# Patient Record
Sex: Female | Born: 1945 | Race: White | Hispanic: No | Marital: Married | Smoking: Former smoker
Health system: Southern US, Community
[De-identification: ages and names within clinical notes are randomized; demographics above are authoritative.]

## PROBLEM LIST (undated history)

## (undated) DIAGNOSIS — K7689 Other specified diseases of liver: Secondary | ICD-10-CM

## (undated) DIAGNOSIS — L0390 Cellulitis, unspecified: Secondary | ICD-10-CM

## (undated) DIAGNOSIS — G4730 Sleep apnea, unspecified: Secondary | ICD-10-CM

## (undated) DIAGNOSIS — H409 Unspecified glaucoma: Secondary | ICD-10-CM

## (undated) DIAGNOSIS — K644 Residual hemorrhoidal skin tags: Secondary | ICD-10-CM

## (undated) DIAGNOSIS — I10 Essential (primary) hypertension: Secondary | ICD-10-CM

## (undated) DIAGNOSIS — J45909 Unspecified asthma, uncomplicated: Secondary | ICD-10-CM

## (undated) DIAGNOSIS — H269 Unspecified cataract: Secondary | ICD-10-CM

## (undated) DIAGNOSIS — Z9989 Dependence on other enabling machines and devices: Secondary | ICD-10-CM

## (undated) DIAGNOSIS — R011 Cardiac murmur, unspecified: Secondary | ICD-10-CM

## (undated) DIAGNOSIS — K589 Irritable bowel syndrome without diarrhea: Secondary | ICD-10-CM

## (undated) DIAGNOSIS — L409 Psoriasis, unspecified: Secondary | ICD-10-CM

## (undated) DIAGNOSIS — G4733 Obstructive sleep apnea (adult) (pediatric): Secondary | ICD-10-CM

## (undated) DIAGNOSIS — R600 Localized edema: Secondary | ICD-10-CM

## (undated) DIAGNOSIS — K760 Fatty (change of) liver, not elsewhere classified: Secondary | ICD-10-CM

## (undated) DIAGNOSIS — M1990 Unspecified osteoarthritis, unspecified site: Secondary | ICD-10-CM

## (undated) DIAGNOSIS — G629 Polyneuropathy, unspecified: Secondary | ICD-10-CM

## (undated) HISTORY — DX: Polyneuropathy, unspecified: G62.9

## (undated) HISTORY — DX: Unspecified glaucoma: H40.9

## (undated) HISTORY — DX: Irritable bowel syndrome, unspecified: K58.9

## (undated) HISTORY — DX: Psoriasis, unspecified: L40.9

## (undated) HISTORY — DX: Residual hemorrhoidal skin tags: K64.4

## (undated) HISTORY — DX: Cellulitis, unspecified: L03.90

## (undated) HISTORY — DX: Unspecified cataract: H26.9

## (undated) HISTORY — DX: Essential (primary) hypertension: I10

## (undated) HISTORY — DX: Localized edema: R60.0

## (undated) HISTORY — DX: Obstructive sleep apnea (adult) (pediatric): G47.33

## (undated) HISTORY — DX: Other specified diseases of liver: K76.89

## (undated) HISTORY — DX: Fatty (change of) liver, not elsewhere classified: K76.0

## (undated) HISTORY — PX: CATARACT EXTRACTION, BILATERAL: SHX1313

## (undated) HISTORY — DX: Dependence on other enabling machines and devices: Z99.89

## (undated) HISTORY — DX: Cardiac murmur, unspecified: R01.1

## (undated) HISTORY — DX: Sleep apnea, unspecified: G47.30

## (undated) HISTORY — DX: Unspecified asthma, uncomplicated: J45.909

## (undated) HISTORY — DX: Unspecified osteoarthritis, unspecified site: M19.90

---

## 2001-01-01 ENCOUNTER — Other Ambulatory Visit: Admission: RE | Admit: 2001-01-01 | Discharge: 2001-01-01 | Payer: Self-pay | Admitting: Family Medicine

## 2002-01-03 ENCOUNTER — Other Ambulatory Visit: Admission: RE | Admit: 2002-01-03 | Discharge: 2002-01-03 | Payer: Self-pay | Admitting: Family Medicine

## 2003-06-03 ENCOUNTER — Other Ambulatory Visit: Admission: RE | Admit: 2003-06-03 | Discharge: 2003-06-03 | Payer: Self-pay | Admitting: *Deleted

## 2003-07-22 ENCOUNTER — Encounter (INDEPENDENT_AMBULATORY_CARE_PROVIDER_SITE_OTHER): Payer: Self-pay | Admitting: Specialist

## 2003-07-22 ENCOUNTER — Encounter: Admission: RE | Admit: 2003-07-22 | Discharge: 2003-07-22 | Payer: Self-pay | Admitting: General Surgery

## 2003-10-23 ENCOUNTER — Encounter: Admission: RE | Admit: 2003-10-23 | Discharge: 2003-10-23 | Payer: Self-pay | Admitting: Family Medicine

## 2004-01-17 HISTORY — PX: OTHER SURGICAL HISTORY: SHX169

## 2004-04-21 ENCOUNTER — Encounter: Admission: RE | Admit: 2004-04-21 | Discharge: 2004-04-21 | Payer: Self-pay | Admitting: Family Medicine

## 2004-06-02 ENCOUNTER — Ambulatory Visit: Payer: Self-pay | Admitting: Internal Medicine

## 2004-06-15 ENCOUNTER — Encounter (INDEPENDENT_AMBULATORY_CARE_PROVIDER_SITE_OTHER): Payer: Self-pay | Admitting: Specialist

## 2004-06-15 ENCOUNTER — Ambulatory Visit: Payer: Self-pay | Admitting: Internal Medicine

## 2004-06-15 ENCOUNTER — Ambulatory Visit (HOSPITAL_COMMUNITY): Admission: RE | Admit: 2004-06-15 | Discharge: 2004-06-15 | Payer: Self-pay | Admitting: Internal Medicine

## 2004-06-15 LAB — HM COLONOSCOPY: HM Colonoscopy: NORMAL

## 2004-07-06 ENCOUNTER — Encounter: Admission: RE | Admit: 2004-07-06 | Discharge: 2004-07-06 | Payer: Self-pay | Admitting: Family Medicine

## 2004-07-07 ENCOUNTER — Encounter: Admission: RE | Admit: 2004-07-07 | Discharge: 2004-07-07 | Payer: Self-pay | Admitting: Family Medicine

## 2004-07-21 ENCOUNTER — Other Ambulatory Visit: Admission: RE | Admit: 2004-07-21 | Discharge: 2004-07-21 | Payer: Self-pay | Admitting: Family Medicine

## 2005-01-16 HISTORY — PX: HYSTEROSCOPY W/D&C: SHX1775

## 2005-01-16 HISTORY — PX: HYSTEROSCOPY WITH D & C: SHX1775

## 2005-04-26 ENCOUNTER — Encounter: Admission: RE | Admit: 2005-04-26 | Discharge: 2005-04-26 | Payer: Self-pay | Admitting: Family Medicine

## 2005-11-10 ENCOUNTER — Ambulatory Visit: Payer: Self-pay | Admitting: Obstetrics and Gynecology

## 2006-05-17 ENCOUNTER — Encounter: Admission: RE | Admit: 2006-05-17 | Discharge: 2006-05-17 | Payer: Self-pay | Admitting: Family Medicine

## 2006-09-30 ENCOUNTER — Emergency Department: Payer: Self-pay | Admitting: Emergency Medicine

## 2007-07-09 ENCOUNTER — Encounter: Admission: RE | Admit: 2007-07-09 | Discharge: 2007-07-09 | Payer: Self-pay | Admitting: Family Medicine

## 2007-07-27 ENCOUNTER — Encounter (INDEPENDENT_AMBULATORY_CARE_PROVIDER_SITE_OTHER): Payer: Self-pay | Admitting: Emergency Medicine

## 2007-07-27 ENCOUNTER — Ambulatory Visit: Payer: Self-pay | Admitting: Vascular Surgery

## 2007-07-27 ENCOUNTER — Inpatient Hospital Stay (HOSPITAL_COMMUNITY): Admission: EM | Admit: 2007-07-27 | Discharge: 2007-08-01 | Payer: Self-pay | Admitting: Emergency Medicine

## 2008-01-17 HISTORY — PX: UMBILICAL HERNIA REPAIR: SHX196

## 2008-01-17 HISTORY — PX: VAGINAL HYSTERECTOMY: SUR661

## 2008-05-18 ENCOUNTER — Encounter: Admission: RE | Admit: 2008-05-18 | Discharge: 2008-05-18 | Payer: Self-pay | Admitting: Internal Medicine

## 2008-09-04 ENCOUNTER — Encounter: Admission: RE | Admit: 2008-09-04 | Discharge: 2008-09-04 | Payer: Self-pay | Admitting: Internal Medicine

## 2008-11-18 ENCOUNTER — Ambulatory Visit (HOSPITAL_COMMUNITY): Admission: RE | Admit: 2008-11-18 | Discharge: 2008-11-18 | Payer: Self-pay | Admitting: Obstetrics and Gynecology

## 2009-01-06 ENCOUNTER — Encounter (INDEPENDENT_AMBULATORY_CARE_PROVIDER_SITE_OTHER): Payer: Self-pay | Admitting: Obstetrics and Gynecology

## 2009-01-06 ENCOUNTER — Ambulatory Visit (HOSPITAL_COMMUNITY): Admission: RE | Admit: 2009-01-06 | Discharge: 2009-01-07 | Payer: Self-pay | Admitting: Obstetrics and Gynecology

## 2009-10-18 LAB — HEPATIC FUNCTION PANEL
ALT: 23 U/L (ref 7–35)
AST: 22 U/L (ref 13–35)
Alkaline Phosphatase: 83 U/L (ref 25–125)
Bilirubin, Total: 1.1 mg/dL

## 2009-10-18 LAB — BASIC METABOLIC PANEL
BUN: 12 mg/dL (ref 4–21)
Creatinine: 0.9 mg/dL (ref 0.5–1.1)
Glucose: 87 mg/dL
Potassium: 4.5 mmol/L (ref 3.4–5.3)
Sodium: 142 mmol/L (ref 137–147)

## 2009-10-18 LAB — LIPID PANEL
Cholesterol: 210 mg/dL — AB (ref 0–200)
HDL: 49 mg/dL (ref 35–70)
LDL Cholesterol: 125 mg/dL
LDl/HDL Ratio: 2.6
Triglycerides: 178 mg/dL — AB (ref 40–160)

## 2009-10-18 LAB — TSH: TSH: 3.67 u[IU]/mL (ref 0.41–5.90)

## 2009-10-20 ENCOUNTER — Encounter: Admission: RE | Admit: 2009-10-20 | Discharge: 2009-10-20 | Payer: Self-pay | Admitting: Internal Medicine

## 2010-04-18 LAB — COMPREHENSIVE METABOLIC PANEL
ALT: 26 U/L (ref 0–35)
AST: 24 U/L (ref 0–37)
Albumin: 4 g/dL (ref 3.5–5.2)
Alkaline Phosphatase: 80 U/L (ref 39–117)
BUN: 15 mg/dL (ref 6–23)
CO2: 28 mEq/L (ref 19–32)
Calcium: 9.3 mg/dL (ref 8.4–10.5)
Chloride: 102 mEq/L (ref 96–112)
Creatinine, Ser: 0.88 mg/dL (ref 0.4–1.2)
GFR calc Af Amer: >60 mL/min (ref >60)
GFR calc non Af Amer: >60 mL/min (ref >60)
Glucose, Bld: 90 mg/dL (ref 70–99)
Potassium: 3.9 mEq/L (ref 3.5–5.1)
Sodium: 137 mEq/L (ref 135–145)
Total Bilirubin: 0.7 mg/dL (ref 0.3–1.2)
Total Protein: 7.9 g/dL (ref 6.0–8.3)

## 2010-04-18 LAB — DIFFERENTIAL
Basophils Absolute: 0 10*3/uL (ref 0.0–0.1)
Basophils Relative: 0 % (ref 0–1)
Eosinophils Absolute: 0.1 10*3/uL (ref 0.0–0.7)
Eosinophils Relative: 1 % (ref 0–5)
Lymphocytes Relative: 35 % (ref 12–46)
Lymphs Abs: 3.7 10*3/uL (ref 0.7–4.0)
Monocytes Absolute: 0.6 10*3/uL (ref 0.1–1.0)
Monocytes Relative: 6 % (ref 3–12)
Neutro Abs: 6.1 10*3/uL (ref 1.7–7.7)
Neutrophils Relative %: 58 % (ref 43–77)

## 2010-04-18 LAB — CBC
HCT: 31.6 % — ABNORMAL LOW (ref 36.0–46.0)
HCT: 44 % (ref 36.0–46.0)
Hemoglobin: 10.8 g/dL — ABNORMAL LOW (ref 12.0–15.0)
Hemoglobin: 14.6 g/dL (ref 12.0–15.0)
MCHC: 33.1 g/dL (ref 30.0–36.0)
MCHC: 34.3 g/dL (ref 30.0–36.0)
MCV: 88.3 fL (ref 78.0–100.0)
MCV: 88.7 fL (ref 78.0–100.0)
Platelets: 288 10*3/uL (ref 150–400)
Platelets: 364 10*3/uL (ref 150–400)
RBC: 3.58 MIL/uL — ABNORMAL LOW (ref 3.87–5.11)
RBC: 4.97 MIL/uL (ref 3.87–5.11)
RDW: 13 % (ref 11.5–15.5)
RDW: 13.1 % (ref 11.5–15.5)
WBC: 10.5 10*3/uL (ref 4.0–10.5)
WBC: 15.1 10*3/uL — ABNORMAL HIGH (ref 4.0–10.5)

## 2010-04-20 LAB — COMPREHENSIVE METABOLIC PANEL
ALT: 33 U/L (ref 0–35)
AST: 31 U/L (ref 0–37)
Albumin: 3.7 g/dL (ref 3.5–5.2)
Alkaline Phosphatase: 66 U/L (ref 39–117)
BUN: 11 mg/dL (ref 6–23)
CO2: 28 mEq/L (ref 19–32)
Calcium: 8.9 mg/dL (ref 8.4–10.5)
Chloride: 106 mEq/L (ref 96–112)
Creatinine, Ser: 0.71 mg/dL (ref 0.4–1.2)
GFR calc Af Amer: >60 mL/min (ref >60)
GFR calc non Af Amer: >60 mL/min (ref >60)
Glucose, Bld: 95 mg/dL (ref 70–99)
Potassium: 3.8 mEq/L (ref 3.5–5.1)
Sodium: 141 mEq/L (ref 135–145)
Total Bilirubin: 1 mg/dL (ref 0.3–1.2)
Total Protein: 7.4 g/dL (ref 6.0–8.3)

## 2010-04-20 LAB — CBC
HCT: 40.5 % (ref 36.0–46.0)
Hemoglobin: 13.8 g/dL (ref 12.0–15.0)
MCHC: 34.1 g/dL (ref 30.0–36.0)
MCV: 86.8 fL (ref 78.0–100.0)
Platelets: 339 10*3/uL (ref 150–400)
RBC: 4.67 MIL/uL (ref 3.87–5.11)
RDW: 13.3 % (ref 11.5–15.5)
WBC: 9.6 10*3/uL (ref 4.0–10.5)

## 2010-05-31 NOTE — H&P (Signed)
[name], [name] NO.:  [id number]   MEDICAL RECORD NO.:  [id number]          PATIENT TYPE:  EMS   LOCATION:  ED                           FACILITY:  [facility]   PHYSICIAN:  Hind Bosie Helper, MD      DATE OF BIRTH:  [birth year]   DATE OF ADMISSION:  07/27/2007  DATE OF DISCHARGE:                              HISTORY & PHYSICAL   PRIMARY CARE PHYSICIAN:  Talmadge Coventry, M.D.   CHIEF COMPLAINT:  Fever.   HISTORY OF PRESENT ILLNESS:  This is a 65 year old female with a history  of asthma, hypertension, sleep apnea on CPAP, admitted to the hospital  with chief complaint of fever of 103.4, which started on Thursday.  Fever associated with chills and sweating.  Patient denies any sore  throat, but admitted mild degree of cough, which she felt is secondary  to her history of asthma.  Today the patient noticed some pain and  redness on her left leg.  Patient denies any numbness or weakness on her  left leg.  Condition associated with mild degree  of pain.  Patient also  denies any shortness of breath.   PAST MEDICAL HISTORY:  Significant for:  1. Hypertension.  2. Sleep apnea.  3. Asthma.  4. The patient also has a history of  atypical cervical cells and      status post hysteroscopy and D&C.   FAMILY HISTORY:  Father died with history of heart attack.  Mother is  still alive, history of dementia.  She lives at nursing home.   SOCIAL HISTORY:  Patient is retired.  She is married, she has 2  daughters.  Denies any smoking, denies any al chol, denies IV blood  abuse.   MEDICATIONS:  1. ProAir as needed.  2. Hydrochlorothiazide 12.5 mg daily.  3. Zyrtec 10 mg.  4. Also patient gets allergy shot every week.   ALLERGIES:  SULFA.  SHE ALSO HAS ALLERGY TO DUST AND CATS.   SYSTEMIC REVIEW:  Per HPI.   EXAMINATION:  Temperature 103.4, blood pressure 140/78 pulse is 116,  respiratory rate 20, saturated 96% on room air.  HEENT:  Normocephalic, atraumatic.  Pupils  equal and reactive to light  and accommodation.  Extraocular muscle movement was normal.  Tonsils  mild erythema but there is no exudate.  No lymphadenopathy, no JVD.  HEART:  S1 ad S2.  Mild tachycardia.  LUNGS:  Normal, regular breathing with equal air entry.  ABDOMEN:  Soft, nontender.  Bowel sounds positive.  There is evidence of  umbilical hernia.  LOWER EXTREMITY:  There is evidence of left leg erythema.  Peripheral  pulses intact.  CNS EXAM:  Oriented x3.   Blood workup and chest x-ray pending.   ASSESSMENT/PLAN:  1. Left leg cellulitis.  Cannot rule out deep vein thrombosis.  2. Febrile episode, most probably secondary to #1.  3. Hypertension.  4. History of sleep apnea.   PLAN:  Admit the patient to the hospital and start the patient on broad  spectrum antibiotics, mainly Unasyn 3 gram IV.  Get venous  Doppler of  her lower extremities.  Septic workup and chest x-ray for evaluation of  other cause of her febrile episode.  Patient denies any flu-like  symptoms or respiratory symptoms.  We will observe during  hospitalization.  Deep vein thrombosis and gastrointestinal prophylaxis.      Hind Bosie Helper, MD  Electronically Signed     HIE/MEDQ  D:  07/27/2007  T:  07/27/2007  Job:  [id number]

## 2010-05-31 NOTE — Discharge Summary (Signed)
NAMEJONIYA, [name]                 ACCOUNT NO.:  [id number]   MEDICAL RECORD NO.:  [id number]          PATIENT TYPE:  INP   LOCATION:  1528                         FACILITY:  [facility]   PHYSICIAN:  Herbie Saxon, MDDATE OF BIRTH:  07-17-1945   DATE OF ADMISSION:  07/27/2007  DATE OF DISCHARGE:  08/01/2007                               DISCHARGE SUMMARY   DISCHARGE DIAGNOSES:  1. Left leg cellulitis.  2. Urinary tract infection.  3. Hypertension.  4. History of sleep apnea.  5. History of bronchial asthma.  6. Anemia of chronic disease.  7. Poor compliance with continuous positive airway pressure.  8. Hypokalemia, repleted.  9. Left lower lobe pneumonia.   DIAGNOSTICS:  1. The chest x-ray of July 28, 2007 shows improved aeration of the      left base without evidence of focal consolidation.  There is      borderline cardiomegaly without failure.  2. Chest x-ray on July 27, 2007 shows patchy airspace disease in the      left base that may be due to pneumonia in the lingular left lower      lobe.   LABORATORY DATA:  Microbiology:  The urine culture of July 27, 2007 was  positive for Streptococcus agalactiae.  Repeat urine culture July 29, 2007 was negative.  H1N1 screen was also negative.  Lab tests show the  sodium is 141, potassium 3.7, chloride 106, bicarbonate 27, glucose 103,  BUN 10, creatinine 0.3, WBC 10, hematocrit 32, platelet count 329.   HOSPITAL COURSE:  This is 65 year old female presented to the emergency  room with high-grade fever temperature 103, redness and swelling of her  left leg.  The venous Doppler of her left leg was negative for DVT.  The  patient was initially started on IV Unasyn and vancomycin.  She is  clinically improved with this antibiotic regimen.  Chest x-ray showed  radiological improvement and clinically her chest is also clearer.  The  patient was continued on iv antibiotics and she was started on Diflucan  and p.r.n. Benadryl.   Hypokalemia was repleted.   CONDITION ON DISCHARGE:  Stable.  The patient is being discharged home.   DISCHARGE INSTRUCTIONS:  1. She is to continue activity slowly.  2. She has been educated to comply better with CPAP, walk with      assistance, elevate her left leg.  3. Diet should be low-sodium, heart-healthy, low-cholesterol.  4. Follow up with Dr. Talmadge Coventry in 5-7 days.   DISCHARGE MEDICATIONS:  1. Keflex 500 mg q.6 h. for 1 week.  2. Clindamycin 600 mg b.i.d. for 1 week.  3. Diflucan 100 mg daily for 1 week.  4. Benadryl 25 mg q.8 h. p.r.n.  5. Vicodin 5/500 one q.6 h. p.r.n.  6. Ultracet 2 tablets q.6 h. p.r.n.  7. Continue with her home medications, albuterol 2 puffs q.6 h. p.r.n.  8. HCTZ 12.5 mg daily.  9. Qvar 2 puffs q.6 h. p.r.n.  10.__________10 mg daily.  11.Multivitamin 1 tablet daily.   PHYSICAL EXAMINATION:  GENERAL:  On examination  today, she is an elderly  lady not in acute distress.  She is clinically pale, not jaundiced.  VITAL SIGNS:  Temperature 97.3, pulse 72, respiratory rate 20, blood  pressure 123/66.  HEENT:  Pupils are equal and reactive to light and accommodation.  Oropharynx and nasopharynx are clear.  No elevated thyromegaly.  NECK:  Supple.  The patient does not have any jugular venous distention.  EXTREMITIES:  There is no cyanosis or clubbing.  Peripheral pulses are  present.  Left leg erythema and tenderness much reduced.  Power is 5  globally.  No pedal edema.  CHEST:  Clinically clear.  HEART:  Sounds 1 and 2, regular rate and rhythm.  No murmurs, gallops or  rubs.  ABDOMEN:  Soft, nontender.  No organomegaly.  Inguinal orifices are  patent.  NEUROLOGIC:  She is alert and oriented to time, place and person.   Discharge greater than 30 minutes.      Herbie Saxon, MD  Electronically Signed     MIO/MEDQ  D:  08/01/2007  T:  08/01/2007  Job:  [id number]

## 2010-07-23 LAB — HEMOGLOBIN A1C: Hgb A1c MFr Bld: 6.1 % — AB (ref 4.0–6.0)

## 2010-09-05 ENCOUNTER — Encounter: Payer: Self-pay | Admitting: Internal Medicine

## 2010-09-05 DIAGNOSIS — K760 Fatty (change of) liver, not elsewhere classified: Secondary | ICD-10-CM

## 2010-09-05 DIAGNOSIS — G4733 Obstructive sleep apnea (adult) (pediatric): Secondary | ICD-10-CM | POA: Insufficient documentation

## 2010-09-05 DIAGNOSIS — L409 Psoriasis, unspecified: Secondary | ICD-10-CM | POA: Insufficient documentation

## 2010-09-05 DIAGNOSIS — G4730 Sleep apnea, unspecified: Secondary | ICD-10-CM

## 2010-09-05 DIAGNOSIS — R609 Edema, unspecified: Secondary | ICD-10-CM | POA: Insufficient documentation

## 2010-09-06 ENCOUNTER — Ambulatory Visit (INDEPENDENT_AMBULATORY_CARE_PROVIDER_SITE_OTHER): Payer: Medicare Other | Admitting: Internal Medicine

## 2010-09-06 ENCOUNTER — Encounter: Payer: Self-pay | Admitting: Internal Medicine

## 2010-09-06 VITALS — BP 140/88 | HR 62 | Temp 97.8°F | Resp 16 | Ht 66.0 in | Wt 213.8 lb

## 2010-09-06 DIAGNOSIS — G4730 Sleep apnea, unspecified: Secondary | ICD-10-CM

## 2010-09-06 DIAGNOSIS — E538 Deficiency of other specified B group vitamins: Secondary | ICD-10-CM

## 2010-09-06 DIAGNOSIS — Z1211 Encounter for screening for malignant neoplasm of colon: Secondary | ICD-10-CM

## 2010-09-06 DIAGNOSIS — E785 Hyperlipidemia, unspecified: Secondary | ICD-10-CM

## 2010-09-06 DIAGNOSIS — R030 Elevated blood-pressure reading, without diagnosis of hypertension: Secondary | ICD-10-CM | POA: Insufficient documentation

## 2010-09-06 DIAGNOSIS — K589 Irritable bowel syndrome without diarrhea: Secondary | ICD-10-CM | POA: Insufficient documentation

## 2010-09-06 DIAGNOSIS — G5790 Unspecified mononeuropathy of unspecified lower limb: Secondary | ICD-10-CM

## 2010-09-06 DIAGNOSIS — E1149 Type 2 diabetes mellitus with other diabetic neurological complication: Secondary | ICD-10-CM | POA: Insufficient documentation

## 2010-09-06 DIAGNOSIS — Z1239 Encounter for other screening for malignant neoplasm of breast: Secondary | ICD-10-CM

## 2010-09-06 DIAGNOSIS — E119 Type 2 diabetes mellitus without complications: Secondary | ICD-10-CM

## 2010-09-06 DIAGNOSIS — E669 Obesity, unspecified: Secondary | ICD-10-CM

## 2010-09-06 DIAGNOSIS — R5383 Other fatigue: Secondary | ICD-10-CM | POA: Insufficient documentation

## 2010-09-06 DIAGNOSIS — R5381 Other malaise: Secondary | ICD-10-CM | POA: Insufficient documentation

## 2010-09-06 DIAGNOSIS — E1169 Type 2 diabetes mellitus with other specified complication: Secondary | ICD-10-CM | POA: Insufficient documentation

## 2010-09-06 DIAGNOSIS — K760 Fatty (change of) liver, not elsewhere classified: Secondary | ICD-10-CM

## 2010-09-06 DIAGNOSIS — K7689 Other specified diseases of liver: Secondary | ICD-10-CM

## 2010-09-06 MED ORDER — PROMETHAZINE HCL 12.5 MG PO TABS: 12.5000 mg | ORAL_TABLET | Freq: Four times a day (QID) | ORAL | Status: DC | PRN

## 2010-09-06 NOTE — Assessment & Plan Note (Signed)
Prior workup suggested B12 deficiency, but supplementation has not improved symptoms.  Will check urine for heavy metals with 24 hr screen.

## 2010-09-06 NOTE — Assessment & Plan Note (Signed)
Currently quiescent

## 2010-09-06 NOTE — Progress Notes (Signed)
Subjective:    Patient ID: [name], female    DOB: [birth year], 65 y.o.   MRN: [id number]  HPI  Outpatient Encounter Prescriptions as of 09/06/2010  Medication Sig Dispense Refill  . albuterol (PROAIR HFA) 108 (90 BASE) MCG/ACT inhaler Inhale 2 puffs into the lungs daily as needed.        Marland Kitchen azelastine (ASTELIN) 137 MCG/SPRAY nasal spray Place 1 spray into the nose daily. Use in each nostril as directed       . beclomethasone (QVAR) 80 MCG/ACT inhaler Inhale 1 puff into the lungs daily as needed.        . cetirizine (ZYRTEC) 10 MG tablet Take 10 mg by mouth daily.        . clobetasol (OLUX) 0.05 % topical foam Apply topically as needed.        . cyanocobalamin (,VITAMIN B-12,) 1000 MCG/ML injection Inject 1,000 mcg into the muscle every 30 (thirty) days.        Marland Kitchen desonide (DESOWEN) 0.05 % ointment Apply topically as needed.        . Multiple Vitamins-Minerals (CENTRUM SILVER PO) Take by mouth daily.        . sodium chloride (OCEAN) 0.65 % SOLN nasal spray Place 1 spray into the nose as needed.        . Tuberculin-Allergy Syringes (B-D ALLERGY SYRINGE 1CC/28G) 28G X 1/2" 1 ML MISC by Does not apply route.        . promethazine (PHENERGAN) 12.5 MG tablet Take 1 tablet (12.5 mg total) by mouth every 6 (six) hours as needed for nausea.  30 tablet  1  . DISCONTD: fluocinonide (LIDEX) 0.05 % ointment Apply topically as needed.           Review of Systems     BP 140/88  Pulse 62  Temp(Src) 97.8 F (36.6 C) (Oral)  Resp 16  Ht 5\' 6"  (1.676 m)  Wt 213 lb 12 oz (96.956 kg)  BMI 34.50 kg/m2     Objective:   Physical Exam        Assessment & Plan:  Screening for breast malignancy:  [facility]  She has made her own appt     Subjective:     [name] is a 65 y.o. female and is here for a comprehensive physical exam. The patient reports no problems.  History   Social History  . Marital Status: Married    Spouse Name: N/A   Number of Children: N/A  . Years of Education: N/A   Occupational History  . RETIRED    Social History Main Topics  . Smoking status: Former Smoker    Types: Cigarettes    Quit date: 01/16/1978  . Smokeless tobacco: Never Used   Comment: REMOTELY QUIT IN 1987 AFTER A FEW YEARS OF USE  . Alcohol Use: No  . Drug Use: No  . Sexually Active: Not on file   Other Topics Concern  . Not on file   Social History Narrative  . No narrative on file   Health Maintenance  Topic Date Due  . Tetanus/tdap  09/20/1963  . Colonoscopy  09/20/1994  . Zostavax  09/19/2004  . Pneumococcal Polysaccharide Vaccine Age 73 And Over  09/19/2009  . Influenza Vaccine  10/17/2010  . Mammogram  10/21/2011    The following portions of the patient's history were reviewed and updated as appropriate: allergies, current medications, past family history, past medical history,  past social history, past surgical history and problem list.  Review of Systems Constitutional: negative except for sweats Eyes: negative Ears, nose, mouth, throat, and face: negative Respiratory: negative Cardiovascular: negative Gastrointestinal: negative Genitourinary:negative Integument/breast: negative Hematologic/lymphatic: negative Musculoskeletal:negative except for left lateral ankle pain aggravated by walking Neurological: negative except for paresthesia Behavioral/Psych: negative Endocrine: negative Allergic/Immunologic: negative   Objective:    General appearance: alert, cooperative and appears stated age Head: Normocephalic, without obvious abnormality, atraumatic Eyes: conjunctivae/corneas clear. PERRL, EOM's intact. Fundi benign. Ears: normal TM's and external ear canals both ears Nose: Nares normal. Septum midline. Mucosa normal. No drainage or sinus tenderness. Throat: lips, mucosa, and tongue normal; teeth and gums normal Neck: no adenopathy, no carotid bruit, no JVD, supple, symmetrical, trachea midline and thyroid  not enlarged, symmetric, no tenderness/mass/nodules Back: symmetric, no curvature. ROM normal. No CVA tenderness. Lungs: clear to auscultation bilaterally Breasts: normal appearance, no masses or tenderness, patient deferred Heart: regular rate and rhythm, S1, S2 normal, no murmur, click, rub or gallop Abdomen: soft, non-tender; bowel sounds normal; no masses,  no organomegaly Pelvic: not indicated; post-menopausal, no abnormal Pap smears in past and she is s/p TAH/BSO Extremities: extremities normal, atraumatic, no cyanosis or edema Pulses: 2+ and symmetric Skin: Skin color, texture, turgor normal. No rashes or lesions Lymph nodes: Cervical, supraclavicular, and axillary nodes normal. Neurologic: grossly normal with sensation intact to microfilament.  corrdination and balance normal.     Assessment:      Plan:     See After Visit Summary for Counseling Recommendations

## 2010-09-06 NOTE — Assessment & Plan Note (Signed)
Borderline, HgbA1c is due.  She has managed to keep her HgbA1c below 6.0 her diabetes with diet alone.

## 2010-09-06 NOTE — Assessment & Plan Note (Signed)
Managed with CPAP and continued attempts to lower BMI.

## 2010-09-06 NOTE — Assessment & Plan Note (Signed)
Asymptomatic. Continue weight loss, and exercise when ankle is less problematicn  LFTS and lipids due.

## 2010-09-07 ENCOUNTER — Encounter: Payer: Self-pay | Admitting: Internal Medicine

## 2010-09-09 ENCOUNTER — Other Ambulatory Visit: Payer: Self-pay | Admitting: Internal Medicine

## 2010-09-09 DIAGNOSIS — Z1231 Encounter for screening mammogram for malignant neoplasm of breast: Secondary | ICD-10-CM

## 2010-09-15 ENCOUNTER — Telehealth: Payer: Self-pay | Admitting: Internal Medicine

## 2010-09-15 NOTE — Telephone Encounter (Signed)
Patient called and wanted

## 2010-09-20 ENCOUNTER — Encounter: Payer: Self-pay | Admitting: Internal Medicine

## 2010-09-20 NOTE — Patient Instructions (Signed)
Continue to work on lowering your triglycerides and your weight with regular aerobic exercise (goal is 30 minutes 5 days/week) and a low carbohydrate diet.  We are running some additional tests to rule out causes of your peripheral neuropathy.

## 2010-09-22 ENCOUNTER — Telehealth: Payer: Self-pay | Admitting: Internal Medicine

## 2010-09-22 NOTE — Telephone Encounter (Signed)
If her labs are not scanned into chart yet,  Look in the stack left for Robin and shannon in the front office.  I do not keep them once I have reviewed them.

## 2010-09-22 NOTE — Telephone Encounter (Signed)
Patient called wanted her results of her recent labs.  I saw in her chart where you documented in her chart but she wants the actual lab results because she likes to have a copy of them.  Please advise on where a copy could be and I will get them to the patient.

## 2010-09-26 NOTE — Telephone Encounter (Signed)
Patient has received her labs.

## 2010-10-13 LAB — DIFFERENTIAL
Basophils Absolute: 0
Basophils Relative: 0
Eosinophils Absolute: 0
Eosinophils Relative: 0
Lymphocytes Relative: 14
Lymphs Abs: 2.5
Monocytes Absolute: 1
Monocytes Relative: 6
Neutro Abs: 14.1 — ABNORMAL HIGH
Neutrophils Relative %: 80 — ABNORMAL HIGH

## 2010-10-13 LAB — BASIC METABOLIC PANEL
BUN: 10
BUN: 16
CO2: 25
CO2: 26
Calcium: 8.5
Calcium: 9
Chloride: 105
Chloride: 97
Creatinine, Ser: 0.75
Creatinine, Ser: 1.08
GFR calc Af Amer: >60
GFR calc Af Amer: >60
GFR calc non Af Amer: 51 — ABNORMAL LOW
GFR calc non Af Amer: >60
Glucose, Bld: 100 — ABNORMAL HIGH
Glucose, Bld: 124 — ABNORMAL HIGH
Potassium: 3.1 — ABNORMAL LOW
Potassium: 3.5
Sodium: 135
Sodium: 141

## 2010-10-13 LAB — URINE CULTURE
Colony Count: 65000
Colony Count: NO GROWTH
Culture: NO GROWTH
Special Requests: NEGATIVE

## 2010-10-13 LAB — RAPID URINE DRUG SCREEN, HOSP PERFORMED
Amphetamines: NOT DETECTED
Barbiturates: NOT DETECTED
Benzodiazepines: NOT DETECTED
Cocaine: NOT DETECTED
Opiates: NOT DETECTED
Tetrahydrocannabinol: NOT DETECTED

## 2010-10-13 LAB — URINALYSIS, ROUTINE W REFLEX MICROSCOPIC
Glucose, UA: NEGATIVE
Leukocytes, UA: NEGATIVE
Nitrite: NEGATIVE
Protein, ur: 30 — AB
Specific Gravity, Urine: 1.038 — ABNORMAL HIGH
Urobilinogen, UA: 0.2
pH: 5.5

## 2010-10-13 LAB — CBC
HCT: 32.2 — ABNORMAL LOW
HCT: 42.8
Hemoglobin: 10.9 — ABNORMAL LOW
Hemoglobin: 14.6
MCHC: 33.8
MCHC: 34
MCV: 84.2
MCV: 84.5
Platelets: 307
Platelets: 329
RBC: 3.81 — ABNORMAL LOW
RBC: 5.08
RDW: 13.9
RDW: 13.9
WBC: 10
WBC: 17.7 — ABNORMAL HIGH

## 2010-10-13 LAB — B-NATRIURETIC PEPTIDE (CONVERTED LAB): Pro B Natriuretic peptide (BNP): 38.5

## 2010-10-13 LAB — COMPREHENSIVE METABOLIC PANEL
ALT: 20
AST: 20
Albumin: 2.9 — ABNORMAL LOW
Alkaline Phosphatase: 61
BUN: 13
CO2: 24
Calcium: 8.3 — ABNORMAL LOW
Chloride: 99
Creatinine, Ser: 1.01
GFR calc Af Amer: >60
GFR calc non Af Amer: 56 — ABNORMAL LOW
Glucose, Bld: 164 — ABNORMAL HIGH
Potassium: 3 — ABNORMAL LOW
Sodium: 133 — ABNORMAL LOW
Total Bilirubin: 1.3 — ABNORMAL HIGH
Total Protein: 6.4

## 2010-10-13 LAB — URINE MICROSCOPIC-ADD ON

## 2010-10-13 LAB — PROTIME-INR
INR: 1.1
Prothrombin Time: 14.6

## 2010-10-13 LAB — STREP A DNA PROBE: Group A Strep Probe: NEGATIVE

## 2010-10-13 LAB — CULTURE, BLOOD (ROUTINE X 2): Culture: NO GROWTH

## 2010-10-13 LAB — PHOSPHORUS: Phosphorus: 3.3

## 2010-10-13 LAB — H1N1 SCREEN (PCR): H1N1 Virus Scrn: NOT DETECTED

## 2010-10-13 LAB — VANCOMYCIN, TROUGH: Vancomycin Tr: 14.4

## 2010-10-13 LAB — MAGNESIUM: Magnesium: 1.9

## 2010-10-13 LAB — APTT: aPTT: 36

## 2010-10-14 LAB — BASIC METABOLIC PANEL
BUN: 10
CO2: 27
Calcium: 8.8
Chloride: 106
Creatinine, Ser: 0.83
GFR calc Af Amer: >60
GFR calc non Af Amer: >60
Glucose, Bld: 103 — ABNORMAL HIGH
Potassium: 3.7
Sodium: 141

## 2010-10-14 LAB — POTASSIUM: Potassium: 3.6

## 2010-11-11 ENCOUNTER — Ambulatory Visit
Admission: RE | Admit: 2010-11-11 | Discharge: 2010-11-11 | Disposition: A | Discharge status: Home / Self Care | Payer: Medicare Other | Source: Ambulatory Visit | Attending: Internal Medicine | Admitting: Internal Medicine

## 2010-11-11 DIAGNOSIS — Z1231 Encounter for screening mammogram for malignant neoplasm of breast: Secondary | ICD-10-CM

## 2010-12-13 ENCOUNTER — Ambulatory Visit (INDEPENDENT_AMBULATORY_CARE_PROVIDER_SITE_OTHER): Payer: Medicare Other | Admitting: Internal Medicine

## 2010-12-13 ENCOUNTER — Other Ambulatory Visit (HOSPITAL_COMMUNITY)
Admission: RE | Admit: 2010-12-13 | Discharge: 2010-12-13 | Disposition: A | Discharge status: Home / Self Care | Payer: Medicare Other | Source: Ambulatory Visit | Attending: Internal Medicine | Admitting: Internal Medicine

## 2010-12-13 ENCOUNTER — Encounter: Payer: Self-pay | Admitting: Internal Medicine

## 2010-12-13 DIAGNOSIS — I10 Essential (primary) hypertension: Secondary | ICD-10-CM

## 2010-12-13 DIAGNOSIS — Z124 Encounter for screening for malignant neoplasm of cervix: Secondary | ICD-10-CM | POA: Insufficient documentation

## 2010-12-13 DIAGNOSIS — E119 Type 2 diabetes mellitus without complications: Secondary | ICD-10-CM

## 2010-12-13 DIAGNOSIS — Z1211 Encounter for screening for malignant neoplasm of colon: Secondary | ICD-10-CM

## 2010-12-13 DIAGNOSIS — N760 Acute vaginitis: Secondary | ICD-10-CM

## 2010-12-13 DIAGNOSIS — K7689 Other specified diseases of liver: Secondary | ICD-10-CM

## 2010-12-13 DIAGNOSIS — K760 Fatty (change of) liver, not elsewhere classified: Secondary | ICD-10-CM

## 2010-12-13 MED ORDER — FLUCONAZOLE 150 MG PO TABS: 150.0000 mg | ORAL_TABLET | Freq: Once | ORAL | Status: AC

## 2010-12-13 NOTE — Progress Notes (Signed)
Subjective:    Patient ID: [name], female    DOB: 1945-08-27, 65 y.o.   MRN: [id number]  HPI   65 yo white female presents with pruritis and scant  vaginal discharge after recently receiving antiobiotic treatment for sinusitis as well as two steroid injections, one IM and one intrarticular, for tendonitits and bursitis.   Past Medical History  Diagnosis Date  . Cellulitis     LEFT LEG  . Psoriasis   . Edema leg     LEFT LEG...CHRONIC  . Hepatic cyst     STABLE PER 05/20/2008 ULTRASOUND  . Fatty liver   . Endometrial hyperplasia   . Hypertension     borderline...controlled since taking herself of HCTZ IN [place]  . Diabetes mellitus     CONTROLLED ON DIET ALONE  . Sleep apnea     Current Outpatient Prescriptions on File Prior to Visit  Medication Sig Dispense Refill  . albuterol (PROAIR HFA) 108 (90 BASE) MCG/ACT inhaler Inhale 2 puffs into the lungs daily as needed.        Marland Kitchen azelastine (ASTELIN) 137 MCG/SPRAY nasal spray Place 1 spray into the nose daily. Use in each nostril as directed       . beclomethasone (QVAR) 80 MCG/ACT inhaler Inhale 1 puff into the lungs daily as needed.        . cetirizine (ZYRTEC) 10 MG tablet Take 10 mg by mouth daily.        . clobetasol (OLUX) 0.05 % topical foam Apply topically as needed.        . cyanocobalamin (,VITAMIN B-12,) 1000 MCG/ML injection Inject 1,000 mcg into the muscle every 30 (thirty) days.        Marland Kitchen desonide (DESOWEN) 0.05 % ointment Apply topically as needed.        . Multiple Vitamins-Minerals (CENTRUM SILVER PO) Take by mouth daily.        . sodium chloride (OCEAN) 0.65 % SOLN nasal spray Place 1 spray into the nose as needed.        . Tuberculin-Allergy Syringes (B-D ALLERGY SYRINGE 1CC/28G) 28G X 1/2" 1 ML MISC by Does not apply route.          Review of Systems  Constitutional: Negative for fever, chills and unexpected weight change.  HENT: Negative for hearing loss, ear pain, nosebleeds, congestion, sore throat, facial  swelling, rhinorrhea, sneezing, mouth sores, trouble swallowing, neck pain, neck stiffness, voice change, postnasal drip, sinus pressure, tinnitus and ear discharge.   Eyes: Negative for pain, discharge, redness and visual disturbance.  Respiratory: Negative for cough, chest tightness, shortness of breath, wheezing and stridor.   Cardiovascular: Negative for chest pain, palpitations and leg swelling.  Genitourinary: Positive for vaginal discharge.  Musculoskeletal: Positive for arthralgias. Negative for myalgias.  Skin: Negative for color change and rash.  Neurological: Negative for dizziness, weakness, light-headedness and headaches.  Hematological: Negative for adenopathy.       Objective:   Physical Exam  Constitutional: She is oriented to person, place, and time. She appears well-developed and well-nourished.  HENT:  Mouth/Throat: Oropharynx is clear and moist.  Eyes: EOM are normal. Pupils are equal, round, and reactive to light. No scleral icterus.  Neck: Normal range of motion. Neck supple. No JVD present. No thyromegaly present.  Cardiovascular: Normal rate, regular rhythm, normal heart sounds and intact distal pulses.   Pulmonary/Chest: Effort normal and breath sounds normal.  Abdominal: Soft. Bowel sounds are normal. She exhibits no mass. There is no  tenderness.  Genitourinary: There is erythema and tenderness around the vagina. Vaginal discharge found.  Musculoskeletal: Normal range of motion. She exhibits no edema.  Lymphadenopathy:    She has no cervical adenopathy.  Neurological: She is alert and oriented to person, place, and time.  Skin: Skin is warm and dry.  Psychiatric: She has a normal mood and affect.          Assessment & Plan:

## 2010-12-13 NOTE — Assessment & Plan Note (Signed)
Done  By Brodie with EGD in 2006,  10 yr followup.    

## 2010-12-14 ENCOUNTER — Encounter: Payer: Self-pay | Admitting: Internal Medicine

## 2010-12-14 DIAGNOSIS — I10 Essential (primary) hypertension: Secondary | ICD-10-CM | POA: Insufficient documentation

## 2010-12-14 NOTE — Assessment & Plan Note (Signed)
Managed with diet.  hgba1c was 6.1 in September.

## 2010-12-14 NOTE — Assessment & Plan Note (Addendum)
New diagnosis, with last 2 readings elevated. She has a history of sleep apnea and has recently been treated with sterooids so the diagnosis may be secondary. Will start ACE Inhibitor at next visit if still elevated.

## 2010-12-14 NOTE — Assessment & Plan Note (Signed)
With normal LFTs by Sept labs,  But mild hypertiglyceridemia, LDL was 135 and  trigs 240's.  I have ecommeded the low glycemic index diet and regular exercsie with weight loss goal of 105.  Her efforts have been hindered by ankle pain which is preventing her from exercising.  Will repeat in 6 months.

## 2010-12-14 NOTE — Patient Instructions (Signed)
We are going to treat your for a yeast infection but if your culture grows any other infectious organisms we will add an antibiotic

## 2010-12-21 ENCOUNTER — Encounter: Payer: Self-pay | Admitting: Internal Medicine

## 2010-12-27 ENCOUNTER — Telehealth: Payer: Self-pay | Admitting: Internal Medicine

## 2010-12-27 NOTE — Telephone Encounter (Signed)
Itching has cleared up, but she is really concerned about the odor. She is asking what she should do and if you think she should be seen by a gynecologist.

## 2010-12-27 NOTE — Telephone Encounter (Signed)
[phone] Pt was in a couple of weeks ago with yeast infection  Pt stated she still has an Development worker, international aid.itching is gone If pt needs labs or urine she would perfer to cherry @ [facility] [address] [place]

## 2010-12-27 NOTE — Telephone Encounter (Signed)
Patient says that she still has some bad odor

## 2010-12-27 NOTE — Telephone Encounter (Signed)
If she is not having a discharge and not using douche,  I don't know what else to do for her but send her to gyn. Does she have a preference.

## 2010-12-28 NOTE — Telephone Encounter (Signed)
Left message asking patient to return my call.

## 2010-12-28 NOTE — Telephone Encounter (Signed)
PATIENT RETURNED YOU CALL.  YOU CAN CALL HER AT [phone]

## 2010-12-28 NOTE — Telephone Encounter (Signed)
Patient notified. She will call her gynecologist.

## 2011-02-08 ENCOUNTER — Encounter: Payer: Self-pay | Admitting: Internal Medicine

## 2011-02-09 ENCOUNTER — Other Ambulatory Visit: Payer: Self-pay | Admitting: *Deleted

## 2011-02-09 MED ORDER — CYANOCOBALAMIN 1000 MCG/ML IJ SOLN: 1000.0000 ug | INTRAMUSCULAR | Status: DC

## 2011-02-09 MED ORDER — "SYRINGE/NEEDLE (DISP) 25G X 1"" 3 ML MISC": Status: DC

## 2011-09-20 ENCOUNTER — Other Ambulatory Visit: Payer: Self-pay | Admitting: Internal Medicine

## 2011-09-20 DIAGNOSIS — Z1231 Encounter for screening mammogram for malignant neoplasm of breast: Secondary | ICD-10-CM

## 2011-09-29 LAB — HM MAMMOGRAPHY

## 2011-10-02 ENCOUNTER — Other Ambulatory Visit: Payer: Self-pay | Admitting: Internal Medicine

## 2011-10-02 MED ORDER — PROMETHAZINE HCL 12.5 MG PO TABS: 12.5000 mg | ORAL_TABLET | Freq: Four times a day (QID) | ORAL | Status: DC | PRN

## 2011-11-13 ENCOUNTER — Ambulatory Visit
Admission: RE | Admit: 2011-11-13 | Discharge: 2011-11-13 | Disposition: A | Discharge status: Home / Self Care | Payer: Medicare Other | Source: Ambulatory Visit | Attending: Internal Medicine | Admitting: Internal Medicine

## 2011-11-13 DIAGNOSIS — Z1231 Encounter for screening mammogram for malignant neoplasm of breast: Secondary | ICD-10-CM

## 2012-10-08 ENCOUNTER — Other Ambulatory Visit: Payer: Self-pay

## 2012-10-08 DIAGNOSIS — Z1231 Encounter for screening mammogram for malignant neoplasm of breast: Secondary | ICD-10-CM

## 2012-10-21 ENCOUNTER — Encounter: Payer: Self-pay | Admitting: Internal Medicine

## 2012-10-21 ENCOUNTER — Other Ambulatory Visit: Payer: Self-pay | Admitting: Internal Medicine

## 2012-10-21 ENCOUNTER — Ambulatory Visit (INDEPENDENT_AMBULATORY_CARE_PROVIDER_SITE_OTHER): Payer: Medicare Other | Admitting: Internal Medicine

## 2012-10-21 VITALS — BP 138/80 | HR 70 | Temp 97.9°F | Resp 14 | Ht 73.0 in | Wt 197.5 lb

## 2012-10-21 DIAGNOSIS — K760 Fatty (change of) liver, not elsewhere classified: Secondary | ICD-10-CM

## 2012-10-21 DIAGNOSIS — E559 Vitamin D deficiency, unspecified: Secondary | ICD-10-CM

## 2012-10-21 DIAGNOSIS — E119 Type 2 diabetes mellitus without complications: Secondary | ICD-10-CM

## 2012-10-21 DIAGNOSIS — Z1211 Encounter for screening for malignant neoplasm of colon: Secondary | ICD-10-CM

## 2012-10-21 DIAGNOSIS — K7689 Other specified diseases of liver: Secondary | ICD-10-CM

## 2012-10-21 DIAGNOSIS — R5381 Other malaise: Secondary | ICD-10-CM

## 2012-10-21 DIAGNOSIS — R030 Elevated blood-pressure reading, without diagnosis of hypertension: Secondary | ICD-10-CM

## 2012-10-21 DIAGNOSIS — I739 Peripheral vascular disease, unspecified: Secondary | ICD-10-CM

## 2012-10-21 DIAGNOSIS — Z0000 Encounter for general adult medical examination without abnormal findings: Secondary | ICD-10-CM

## 2012-10-21 DIAGNOSIS — E538 Deficiency of other specified B group vitamins: Secondary | ICD-10-CM

## 2012-10-21 DIAGNOSIS — G4730 Sleep apnea, unspecified: Secondary | ICD-10-CM

## 2012-10-21 DIAGNOSIS — Z23 Encounter for immunization: Secondary | ICD-10-CM

## 2012-10-21 DIAGNOSIS — N6459 Other signs and symptoms in breast: Secondary | ICD-10-CM

## 2012-10-21 DIAGNOSIS — R609 Edema, unspecified: Secondary | ICD-10-CM

## 2012-10-21 DIAGNOSIS — E785 Hyperlipidemia, unspecified: Secondary | ICD-10-CM

## 2012-10-21 DIAGNOSIS — I10 Essential (primary) hypertension: Secondary | ICD-10-CM

## 2012-10-21 DIAGNOSIS — E669 Obesity, unspecified: Secondary | ICD-10-CM

## 2012-10-21 LAB — COMPREHENSIVE METABOLIC PANEL
ALT: 17 U/L (ref 0–35)
AST: 22 U/L (ref 0–37)
Albumin: 4.1 g/dL (ref 3.5–5.2)
Alkaline Phosphatase: 59 U/L (ref 39–117)
BUN: 13 mg/dL (ref 6–23)
CO2: 28 mEq/L (ref 19–32)
Calcium: 8.9 mg/dL (ref 8.4–10.5)
Chloride: 99 mEq/L (ref 96–112)
Creatinine, Ser: 0.9 mg/dL (ref 0.4–1.2)
GFR: 69.73 mL/min (ref >60.00)
Glucose, Bld: 95 mg/dL (ref 70–99)
Potassium: 3.7 mEq/L (ref 3.5–5.1)
Sodium: 136 mEq/L (ref 135–145)
Total Bilirubin: 1.3 mg/dL — ABNORMAL HIGH (ref 0.3–1.2)
Total Protein: 7.5 g/dL (ref 6.0–8.3)

## 2012-10-21 LAB — CBC WITH DIFFERENTIAL/PLATELET
Basophils Absolute: 0 10*3/uL (ref 0.0–0.1)
Basophils Relative: 0.4 % (ref 0.0–3.0)
Eosinophils Absolute: 0 10*3/uL (ref 0.0–0.7)
Eosinophils Relative: 0 % (ref 0.0–5.0)
HCT: 41.2 % (ref 36.0–46.0)
Hemoglobin: 14 g/dL (ref 12.0–15.0)
Lymphocytes Relative: 29.7 % (ref 12.0–46.0)
Lymphs Abs: 3.1 10*3/uL (ref 0.7–4.0)
MCHC: 33.9 g/dL (ref 30.0–36.0)
MCV: 86.6 fl (ref 78.0–100.0)
Monocytes Absolute: 0.6 10*3/uL (ref 0.1–1.0)
Monocytes Relative: 6.1 % (ref 3.0–12.0)
Neutro Abs: 6.7 10*3/uL (ref 1.4–7.7)
Neutrophils Relative %: 63.8 % (ref 43.0–77.0)
Platelets: 304 10*3/uL (ref 150.0–400.0)
RBC: 4.76 Mil/uL (ref 3.87–5.11)
RDW: 13 % (ref 11.5–14.6)
WBC: 10.5 10*3/uL (ref 4.5–10.5)

## 2012-10-21 LAB — LIPID PANEL
Cholesterol: 194 mg/dL (ref 0–200)
HDL: 42.4 mg/dL (ref >39.00)
LDL Cholesterol: 116 mg/dL — ABNORMAL HIGH (ref 0–99)
Total CHOL/HDL Ratio: 5
Triglycerides: 176 mg/dL — ABNORMAL HIGH (ref 0.0–149.0)
VLDL: 35.2 mg/dL (ref 0.0–40.0)

## 2012-10-21 LAB — TSH: TSH: 1.92 u[IU]/mL (ref 0.35–5.50)

## 2012-10-21 LAB — VITAMIN B12: Vitamin B-12: 410 pg/mL (ref 211–911)

## 2012-10-21 NOTE — Patient Instructions (Addendum)
Try benadryl (dipenhyrdamine ) 25 mg one hour before for your post nasal drip.     Your rash may be occurring because you are having an allergic reaction and sinc eyou stopped the zyrtec you are not covered  I am changing your mammogram to a diagnostic on the right side.   You can try Red yeast rice 600 mg capsule twice daily for if your LDL is > 130.  We can repeat your cholesterol as soon as after 6 weeks of taking it   A baby aspirin 81 mg daily has been shown to be  preventive for strokes.

## 2012-10-21 NOTE — Progress Notes (Signed)
Patient ID: [name], female   DOB: [birth year], 67 y.o.   MRN: [id number] The patient is here for annual Medicare wellness examination and management of other chronic and acute problems.   The risk factors are reflected in the social history.  The roster of all physicians providing medical care to patient - is listed in the Snapshot section of the chart.  Activities of daily living:  The patient is 100% independent in all ADLs: dressing, toileting, feeding as well as independent mobility  Home safety : The patient has smoke detectors in the home. They wear seatbelts.  There are no firearms at home. There is no violence in the home.   There is no risks for hepatitis, STDs or HIV. There is no   history of blood transfusion. They have no travel history to infectious disease endemic areas of the world.  The patient has seen their dentist in the last six month. They have seen their eye doctor in the last year. They admit to slight hearing difficulty with regard to whispered voices and some television programs.  They have deferred audiologic testing in the last year.  They do not  have excessive sun exposure. Discussed the need for sun protection: hats, long sleeves and use of sunscreen if there is significant sun exposure.   Diet: the importance of a healthy diet is discussed. They do have a healthy diet.  The benefits of regular aerobic exercise were discussed. She walks 4 times per week ,  20 minutes.   Depression screen: there are no signs or vegative symptoms of depression- irritability, change in appetite, anhedonia, sadness/tearfullness.  Cognitive assessment: the patient manages all their financial and personal affairs and is actively engaged. They could relate day,date,year and events; recalled 2/3 objects at 3 minutes; performed clock-face test normally.  The following portions of the patient's history were reviewed and updated as appropriate: allergies, current medications, past family  history, past medical history,  past surgical history, past social history  and problem list.  Visual acuity was not assessed per patient preference since she has regular follow up with her ophthalmologist. Hearing and body mass index were assessed and reviewed.   During the course of the visit the patient was educated and counseled about appropriate screening and preventive services including : fall prevention , diabetes screening, nutrition counseling, colorectal cancer screening, and recommended immunizations.    Objective:  BP 138/80  Pulse 70  Temp(Src) 97.9 F (36.6 C) (Oral)  Resp 14  Ht 6\' 1"  (1.854 m)  Wt 197 lb 8 oz (89.585 kg)  BMI 26.06 kg/m2  SpO2 98%  BP 138/80  Pulse 70  Temp(Src) 97.9 F (36.6 C) (Oral)  Resp 14  Ht 6\' 1"  (1.854 m)  Wt 197 lb 8 oz (89.585 kg)  BMI 26.06 kg/m2  SpO2 98%  General Appearance:    Alert, cooperative, no distress, appears stated age  Head:    Normocephalic, without obvious abnormality, atraumatic  Eyes:    PERRL, conjunctiva/corneas clear, EOM's intact, fundi    benign, both eyes  Ears:    Normal TM's and external ear canals, both ears  Nose:   Nares normal, septum midline, mucosa normal, no drainage    or sinus tenderness  Throat:   Lips, mucosa, and tongue normal; teeth and gums normal  Neck:   Supple, symmetrical, trachea midline, no adenopathy;    thyroid:  no enlargement/tenderness/nodules; no carotid   bruit or JVD  Back:  Symmetric, no curvature, ROM normal, no CVA tenderness  Lungs:     Clear to auscultation bilaterally, respirations unlabored  Chest Wall:    No tenderness or deformity   Heart:    Regular rate and rhythm, S1 and S2 normal, no murmur, rub   or gallop  Breast Exam:    No tenderness, masses, or nipple abnormality  Abdomen:     Soft, non-tender, bowel sounds active all four quadrants,    no masses, no organomegaly        Extremities:   Extremities normal, atraumatic, no cyanosis or edema  Pulses:    2+ and symmetric all extremities  Skin:   Skin color, texture, turgor normal, no rashes or lesions  Lymph nodes:   Cervical, supraclavicular, and axillary nodes normal  Neurologic:   CNII-XII intact, normal strength, sensation and reflexes    Throughout      Assessment and Plan:  B12 deficiency Repeat B12  is due for evaluation. She has noticed increased trouble remembering people's names lately.  PAD (peripheral artery disease) She was noted to have bilateral mild carotid artery plaque during recent [facility] screening. I recommended statin therapy and daily baby aspirin. She is now willing to use a statin but we'll try rate yeast rice and dry baby aspirin several times weekly.  Metabolic syndrome Fasting glucoses have been well under 125 hemoglobin A1c was 6.1 back in 2012 and has not been rechecked. Reminder for annual eye exam given..  Foot exam done. Meds reviewed and she is not on a baby aspirin  Statin or an ACE inhibitor because she refuses to take medications..     Screening for colon cancer Done  By Juanda Chance with EGD in 2006,  10 yr followup.     Other and unspecified hyperlipidemia She refuses to take a statin despite a history of diabetes and recent carotid artery screenings which suggest early DJD. Recommended trial of red yeast rice 600 mg twice daily.  Obesity (BMI 30-39.9)  Complicated by obesity, fatty liver and hypertriglyceridemia.I have addressed  BMI and recommended wt loss of 10% of body weigh over the next 6 months using a low glycemic index diet and regular exercise a minimum of 5 days per week.    Sleep apnea She wears CPAP every night  using the nasal pillows.  ?She uses [facility] supplies and  needs  Tubing and new headgear for CPAP machine.    Encounter for initial preventive physical examination covered by Medicare Annual comprehensive exam was done including breast, exam.  All screenings have been addressed .    Updated Medication  List Outpatient Encounter Prescriptions as of 10/21/2012  Medication Sig Dispense Refill  . albuterol (PROAIR HFA) 108 (90 BASE) MCG/ACT inhaler Inhale 2 puffs into the lungs daily as needed.        Marland Kitchen azelastine (ASTELIN) 137 MCG/SPRAY nasal spray Place 1 spray into the nose daily. Use in each nostril as directed       . b complex vitamins tablet Take 1 tablet by mouth daily.      . beclomethasone (QVAR) 80 MCG/ACT inhaler Inhale 1 puff into the lungs daily as needed.        . bimatoprost (LUMIGAN) 0.03 % ophthalmic solution Place 1 drop into both eyes at bedtime.      . clobetasol (OLUX) 0.05 % topical foam Apply topically as needed.        . cyanocobalamin (,VITAMIN B-12,) 1000 MCG/ML injection Inject 1 mL (1,000 mcg  total) into the muscle every 30 (thirty) days.  10 mL  3  . desonide (DESOWEN) 0.05 % ointment Apply topically as needed.        . folic acid (FOLVITE) 400 MCG tablet Take 400 mcg by mouth daily.      . Ginkgo Biloba 120 MG CAPS Take 1 capsule by mouth daily.      . meloxicam (MOBIC) 15 MG tablet Take 15 mg by mouth daily as needed.       . Multiple Vitamins-Minerals (CENTRUM SILVER PO) Take by mouth daily.        . Probiotic Product (PROBIOTIC COMPLEX ACIDOPHILUS PO) Take 1 capsule by mouth daily.      . promethazine (PHENERGAN) 12.5 MG tablet Take 1 tablet (12.5 mg total) by mouth every 6 (six) hours as needed for nausea.  30 tablet  1  . RESVERATROL PO Take 100 mcg by mouth daily.      . sodium chloride (OCEAN) 0.65 % SOLN nasal spray Place 1 spray into the nose as needed.        . SYRINGE-NEEDLE, DISP, 3 ML (BD ECLIPSE SYRINGE) 25G X 1" 3 ML MISC Use as directed  12 each  3  . TURMERIC CURCUMIN PO Take 1 capsule by mouth daily.      . cetirizine (ZYRTEC) 10 MG tablet Take 10 mg by mouth daily.        . [DISCONTINUED] promethazine (PHENERGAN) 12.5 MG tablet Take 1 tablet (12.5 mg total) by mouth every 6 (six) hours as needed for nausea.  30 tablet  1  . [DISCONTINUED]  Tuberculin-Allergy Syringes (B-D ALLERGY SYRINGE 1CC/28G) 28G X 1/2" 1 ML MISC by Does not apply route.         No facility-administered encounter medications on file as of 10/21/2012.

## 2012-10-22 DIAGNOSIS — I739 Peripheral vascular disease, unspecified: Secondary | ICD-10-CM | POA: Insufficient documentation

## 2012-10-22 DIAGNOSIS — Z0000 Encounter for general adult medical examination without abnormal findings: Secondary | ICD-10-CM | POA: Insufficient documentation

## 2012-10-22 MED ORDER — ASPIRIN EC 81 MG PO TBEC: 81.0000 mg | DELAYED_RELEASE_TABLET | Freq: Every day | ORAL | Status: DC

## 2012-10-22 NOTE — Assessment & Plan Note (Signed)
She wears CPAP every night  using the nasal pillows.  ?She uses [facility] and  needs  Tubing and new headgear for CPAP machine.

## 2012-10-22 NOTE — Assessment & Plan Note (Signed)
She was noted to have bilateral mild carotid artery plaque during recent [facility] screening. I recommended statin therapy and daily baby aspirin. She is now willing to use a statin but we'll try rate yeast rice and dry baby aspirin several times weekly.

## 2012-10-22 NOTE — Assessment & Plan Note (Signed)
Repeat B12  is due for evaluation. She has noticed increased trouble remembering people's names lately.

## 2012-10-22 NOTE — Assessment & Plan Note (Signed)
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 Recommended trial of red yeast rice 600 mg twice daily.

## 2012-10-22 NOTE — Assessment & Plan Note (Signed)
Done  By Juanda Chance with EGD in 2006,  10 yr followup.

## 2012-10-22 NOTE — Assessment & Plan Note (Signed)
Annual comprehensive exam was done including breast, exam. All screenings have been addressed .  

## 2012-10-22 NOTE — Assessment & Plan Note (Addendum)
Fasting glucoses have been well under 125 hemoglobin A1c was 6.1 back in 2012 and has not been rechecked. Reminder for annual eye exam given..  Foot exam done. Meds reviewed and she is not on a baby aspirin  Statin or an ACE inhibitor because she refuses to take medications.[name]

## 2012-10-22 NOTE — Assessment & Plan Note (Addendum)
Complicated by obesity, fatty liver and hypertriglyceridemia.I have addressed  BMI and recommended wt loss of 10% of body weigh over the next 6 months using a low glycemic index diet and regular exercise a minimum of 5 days per week.

## 2012-10-23 ENCOUNTER — Encounter: Payer: Self-pay | Admitting: *Deleted

## 2012-10-23 LAB — VITAMIN D 25 HYDROXY (VIT D DEFICIENCY, FRACTURES): Vit D, 25-Hydroxy: 61 ng/mL (ref 30–89)

## 2012-11-05 ENCOUNTER — Telehealth: Payer: Self-pay | Admitting: Emergency Medicine

## 2012-11-05 DIAGNOSIS — G4730 Sleep apnea, unspecified: Secondary | ICD-10-CM

## 2012-11-05 NOTE — Telephone Encounter (Signed)
Need new script for CPAP with documentation for medicare. Please advise.

## 2012-11-05 NOTE — Telephone Encounter (Signed)
Patient [name] stating [facility] brought her some new supplies, however when out at her house they stated she could get a new machine. She is having issues with the on and off button with the current one. Patient is requesting that she get a new machine from [facility] out of [place]. Please advise as the patient needs a "letter".

## 2012-11-06 ENCOUNTER — Other Ambulatory Visit: Payer: Self-pay | Admitting: Internal Medicine

## 2012-11-06 NOTE — Telephone Encounter (Signed)
Order faxed to [facility] and patient notified.

## 2012-11-06 NOTE — Telephone Encounter (Signed)
Order printed,  One is on file from last year  [facility]

## 2012-11-13 ENCOUNTER — Other Ambulatory Visit: Payer: Self-pay | Admitting: Internal Medicine

## 2012-11-13 ENCOUNTER — Other Ambulatory Visit: Payer: Self-pay

## 2012-11-13 DIAGNOSIS — N6459 Other signs and symptoms in breast: Secondary | ICD-10-CM

## 2012-11-15 ENCOUNTER — Ambulatory Visit
Admission: RE | Admit: 2012-11-15 | Discharge: 2012-11-15 | Disposition: A | Discharge status: Home / Self Care | Payer: Medicare Other | Source: Ambulatory Visit | Attending: Internal Medicine | Admitting: Internal Medicine

## 2012-11-15 ENCOUNTER — Other Ambulatory Visit: Payer: Self-pay | Admitting: Internal Medicine

## 2012-11-15 ENCOUNTER — Ambulatory Visit
Admission: RE | Admit: 2012-11-15 | Discharge: 2012-11-15 | Disposition: A | Discharge status: Home / Self Care | Payer: BC Managed Care – PPO | Source: Ambulatory Visit | Attending: Internal Medicine | Admitting: Internal Medicine

## 2012-11-15 ENCOUNTER — Telehealth: Payer: Self-pay | Admitting: Emergency Medicine

## 2012-11-15 ENCOUNTER — Ambulatory Visit: Payer: Medicare Other

## 2012-11-15 DIAGNOSIS — N6459 Other signs and symptoms in breast: Secondary | ICD-10-CM

## 2012-11-15 NOTE — Telephone Encounter (Signed)
Order faxed.

## 2012-11-15 NOTE — Telephone Encounter (Signed)
[facility] is calling asking for the order for bilateral diagnostic to be signed. Patient will be there at 1030 am. They will not perform if this isn't signed per [facility].

## 2013-05-19 ENCOUNTER — Telehealth: Payer: Self-pay | Admitting: Internal Medicine

## 2013-05-19 NOTE — Telephone Encounter (Signed)
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  Has this been set up?

## 2013-05-19 NOTE — Telephone Encounter (Signed)
Left message for patient to return call to office. 

## 2013-05-19 NOTE — Telephone Encounter (Signed)
Patient stated she was aware of mass and that dentistry  has been following this for several years called a [facility] something stated by patient. Patient st stated that she prefers to just let her dentist handle unless becomes larger.

## 2013-10-09 ENCOUNTER — Other Ambulatory Visit: Payer: Self-pay

## 2013-10-09 DIAGNOSIS — Z1231 Encounter for screening mammogram for malignant neoplasm of breast: Secondary | ICD-10-CM

## 2013-11-20 ENCOUNTER — Ambulatory Visit: Payer: BC Managed Care – PPO

## 2013-12-01 ENCOUNTER — Ambulatory Visit
Admission: RE | Admit: 2013-12-01 | Discharge: 2013-12-01 | Disposition: A | Discharge status: Home / Self Care | Payer: Medicare Other | Source: Ambulatory Visit

## 2013-12-01 ENCOUNTER — Encounter (INDEPENDENT_AMBULATORY_CARE_PROVIDER_SITE_OTHER): Payer: Self-pay

## 2013-12-01 DIAGNOSIS — Z1231 Encounter for screening mammogram for malignant neoplasm of breast: Secondary | ICD-10-CM

## 2013-12-16 ENCOUNTER — Telehealth: Payer: Self-pay | Admitting: *Deleted

## 2013-12-16 DIAGNOSIS — E785 Hyperlipidemia, unspecified: Secondary | ICD-10-CM

## 2013-12-16 DIAGNOSIS — R5383 Other fatigue: Secondary | ICD-10-CM

## 2013-12-16 DIAGNOSIS — E559 Vitamin D deficiency, unspecified: Secondary | ICD-10-CM

## 2013-12-16 DIAGNOSIS — E538 Deficiency of other specified B group vitamins: Secondary | ICD-10-CM

## 2013-12-16 MED ORDER — "SYRINGE/NEEDLE (DISP) 25G X 1"" 3 ML MISC": Status: DC

## 2013-12-16 MED ORDER — CYANOCOBALAMIN 1000 MCG/ML IJ SOLN: INTRAMUSCULAR | Status: DC

## 2013-12-16 NOTE — Telephone Encounter (Signed)
Rx sent to pharmacy by [facility]cript. While reviewing patient chart for refill, noticed it has been greater than 1 year since labs and appointment. Called pt, scheduled physical for 1.14.16 and fasting labs 01/27/14. Labs need entered. Pt also requesting to have TSH, B12, and Vitamin D checked also.

## 2013-12-16 NOTE — Addendum Note (Signed)
Addended by: Sherlene ShamsULLO, TERESA L on: 12/16/2013 05:16 PM   Modules accepted: Orders

## 2014-01-27 ENCOUNTER — Other Ambulatory Visit (INDEPENDENT_AMBULATORY_CARE_PROVIDER_SITE_OTHER): Payer: Medicare Other

## 2014-01-27 DIAGNOSIS — R5383 Other fatigue: Secondary | ICD-10-CM

## 2014-01-27 DIAGNOSIS — E559 Vitamin D deficiency, unspecified: Secondary | ICD-10-CM | POA: Insufficient documentation

## 2014-01-27 DIAGNOSIS — E538 Deficiency of other specified B group vitamins: Secondary | ICD-10-CM

## 2014-01-27 DIAGNOSIS — E785 Hyperlipidemia, unspecified: Secondary | ICD-10-CM

## 2014-01-27 LAB — CBC WITH DIFFERENTIAL/PLATELET
Basophils Absolute: 0.1 10*3/uL (ref 0.0–0.1)
Basophils Relative: 0.6 % (ref 0.0–3.0)
Eosinophils Absolute: 0.2 10*3/uL (ref 0.0–0.7)
Eosinophils Relative: 1.6 % (ref 0.0–5.0)
HCT: 39.7 % (ref 36.0–46.0)
Hemoglobin: 13.2 g/dL (ref 12.0–15.0)
Lymphocytes Relative: 33.6 % (ref 12.0–46.0)
Lymphs Abs: 3.4 10*3/uL (ref 0.7–4.0)
MCHC: 33.3 g/dL (ref 30.0–36.0)
MCV: 88.3 fl (ref 78.0–100.0)
Monocytes Absolute: 0.7 10*3/uL (ref 0.1–1.0)
Monocytes Relative: 7.3 % (ref 3.0–12.0)
Neutro Abs: 5.7 10*3/uL (ref 1.4–7.7)
Neutrophils Relative %: 56.9 % (ref 43.0–77.0)
Platelets: 326 10*3/uL (ref 150.0–400.0)
RBC: 4.5 Mil/uL (ref 3.87–5.11)
RDW: 13.6 % (ref 11.5–15.5)
WBC: 10 10*3/uL (ref 4.0–10.5)

## 2014-01-27 LAB — COMPREHENSIVE METABOLIC PANEL
ALT: 18 U/L (ref 0–35)
AST: 22 U/L (ref 0–37)
Albumin: 3.9 g/dL (ref 3.5–5.2)
Alkaline Phosphatase: 67 U/L (ref 39–117)
BUN: 13 mg/dL (ref 6–23)
CO2: 27 mEq/L (ref 19–32)
Calcium: 9 mg/dL (ref 8.4–10.5)
Chloride: 103 mEq/L (ref 96–112)
Creatinine, Ser: 0.8 mg/dL (ref 0.4–1.2)
GFR: 73.39 mL/min (ref >60.00)
Glucose, Bld: 109 mg/dL — ABNORMAL HIGH (ref 70–99)
Potassium: 4.5 mEq/L (ref 3.5–5.1)
Sodium: 136 mEq/L (ref 135–145)
Total Bilirubin: 1.6 mg/dL — ABNORMAL HIGH (ref 0.2–1.2)
Total Protein: 7 g/dL (ref 6.0–8.3)

## 2014-01-27 LAB — LIPID PANEL
Cholesterol: 183 mg/dL (ref 0–200)
HDL: 40.6 mg/dL (ref >39.00)
LDL Cholesterol: 112 mg/dL — ABNORMAL HIGH (ref 0–99)
NonHDL: 142.4
Total CHOL/HDL Ratio: 5
Triglycerides: 152 mg/dL — ABNORMAL HIGH (ref 0.0–149.0)
VLDL: 30.4 mg/dL (ref 0.0–40.0)

## 2014-01-27 LAB — TSH: TSH: 3.06 u[IU]/mL (ref 0.35–4.50)

## 2014-01-27 LAB — VITAMIN D 25 HYDROXY (VIT D DEFICIENCY, FRACTURES): VITD: 18.35 ng/mL — ABNORMAL LOW (ref 30.00–100.00)

## 2014-01-27 LAB — VITAMIN B12: Vitamin B-12: 348 pg/mL (ref 211–911)

## 2014-01-27 MED ORDER — ERGOCALCIFEROL 1.25 MG (50000 UT) PO CAPS: 50000.0000 [IU] | ORAL_CAPSULE | ORAL | Status: DC

## 2014-01-27 MED ORDER — ERGOCALCIFEROL 1.25 MG (50000 UT) PO CAPS
50000.0000 [IU] | ORAL_CAPSULE | ORAL | Status: DC
Start: 2014-01-27 — End: 2014-10-15

## 2014-01-27 NOTE — Addendum Note (Signed)
Addended by: Sherlene ShamsULLO, Maleki Hippe L on: 01/27/2014 03:20 PM   Modules accepted: Orders

## 2014-01-27 NOTE — Addendum Note (Signed)
Addended by: Dennie BibleAVIS, Kriss Ishler R on: 01/27/2014 03:33 PM   Modules accepted: Orders

## 2014-01-27 NOTE — Progress Notes (Signed)
Patient is now using [facility] recent script for Mega dose Vit-D,

## 2014-01-29 ENCOUNTER — Ambulatory Visit (INDEPENDENT_AMBULATORY_CARE_PROVIDER_SITE_OTHER): Payer: Medicare Other | Admitting: Internal Medicine

## 2014-01-29 ENCOUNTER — Encounter: Payer: Self-pay | Admitting: Internal Medicine

## 2014-01-29 VITALS — BP 138/78 | HR 74 | Temp 97.6°F | Resp 16 | Ht 66.0 in | Wt 207.0 lb

## 2014-01-29 DIAGNOSIS — F410 Panic disorder [episodic paroxysmal anxiety] without agoraphobia: Secondary | ICD-10-CM | POA: Diagnosis not present

## 2014-01-29 DIAGNOSIS — I10 Essential (primary) hypertension: Secondary | ICD-10-CM

## 2014-01-29 DIAGNOSIS — Z6830 Body mass index (BMI) 30.0-30.9, adult: Secondary | ICD-10-CM

## 2014-01-29 DIAGNOSIS — Z0000 Encounter for general adult medical examination without abnormal findings: Secondary | ICD-10-CM

## 2014-01-29 DIAGNOSIS — E669 Obesity, unspecified: Secondary | ICD-10-CM

## 2014-01-29 DIAGNOSIS — Z23 Encounter for immunization: Secondary | ICD-10-CM

## 2014-01-29 DIAGNOSIS — G4733 Obstructive sleep apnea (adult) (pediatric): Secondary | ICD-10-CM

## 2014-01-29 DIAGNOSIS — F430 Acute stress reaction: Secondary | ICD-10-CM

## 2014-01-29 DIAGNOSIS — E785 Hyperlipidemia, unspecified: Secondary | ICD-10-CM

## 2014-01-29 DIAGNOSIS — Z9989 Dependence on other enabling machines and devices: Secondary | ICD-10-CM

## 2014-01-29 MED ORDER — ALPRAZOLAM 0.25 MG PO TABS: 0.2500 mg | ORAL_TABLET | Freq: Two times a day (BID) | ORAL | Status: DC | PRN

## 2014-01-29 NOTE — Patient Instructions (Signed)
Your last fasting glucose indicates you are at risk for developing diabetes, so I am checking an A1c today   I want you to lose 25 lbs over the next six months with a low glycemic index diet and regular exercise (30 minutes of cardio 5 days per week is your goal)  This is  my version of a  "Low GI"  Diet:  It will still lower your blood sugars and allow you to lose 4 to 8  lbs  per month if you follow it carefully.  Your goal with exercise is a minimum of 30 minutes of aerobic exercise 5 days per week (Walking does not count once it becomes easy!)     All of the foods can be found at grocery stores and in bulk at [employer].  The Atkins protein bars and shakes are available in more varieties at [employer], [employer] and [place].     7 AM Breakfast:  Choose from the following:  Low carbohydrate Protein  Shakes (I recommend the EAS AdvantEdge "Carb Control" shakes  Or the low carb shakes by Atkins.    2.5 carbs   Arnold's "Sandwhich Thin"toasted  w/ peanut butter (no jelly: about 20 net carbs  "Bagel Thin" with cream cheese and salmon: about 20 carbs   a scrambled egg/bacon/cheese burrito made with Mission's "carb balance" whole wheat tortilla  (about 10 net carbs )  A slice of home made fritatta (egg based dish without a crust:  google it)    Avoid cereal and bananas, oatmeal and cream of wheat and grits. They are loaded with carbohydrates!   10 AM: high protein snack  Protein bar by Atkins (the snack size, under 200 cal, usually < 6 net carbs).    A stick of cheese:  Around 1 carb,  100 cal     Dannon Light n Fit [place] Yogurt  (80 cal, 8 carbs)  Other so called "protein bars" and Greek yogurts tend to be loaded with carbohydrates.  Remember, in food advertising, the word "energy" is synonymous for " carbohydrate."  Lunch:   A Sandwich using the bread choices listed, Can use any  Eggs,  lunchmeat, grilled meat or canned tuna), avocado, regular mayo/mustard  and cheese.  A Salad using blue  cheese, ranch,  Goddess or vinagrette,  No croutons or "confetti" and no "candied nuts" but regular nuts OK.   No pretzels or chips.  Pickles and miniature sweet peppers are a good low carb alternative that provide a "crunch"  The bread is the only source of carbohydrate in a sandwich and  can be decreased by trying some of these alternatives to traditional loaf bread  Joseph's makes a pita bread and a flat bread that are 50 cal and 4 net carbs available at [place] and [place].  This can be toasted to use with hummous as well  Toufayan makes a low carb flatbread that's 100 cal and 9 net carbs available at [employer] and [employer]'s makes 2 sizes of  Low carb whole wheat tortilla  (The large one is 210 cal and 6 net carbs)  Flat Out makes flatbreads that are low carb as well  Avoid "Low fat dressings, as well as Barry Brunner and [place] dressings They are loaded with sugar!   3 PM/ Mid day  Snack:  Consider  1 ounce of  almonds, walnuts, pistachios, pecans, peanuts,  Macadamia nuts or a nut medley.  Avoid "granola"; the dried cranberries and  raisins are loaded with carbohydrates. Mixed nuts as long as there are no raisins,  cranberries or dried fruit.    Try the prosciutto/mozzarella cheese sticks by Fiorruci  In deli /backery section   High protein   To avoid overindulging in snacks: Try drinking a glass of unsweeted almond/coconut milk  Or a cup of coffee with your Atkins chocolate bar to keep you from having 3!!!   Pork rinds!  Yes Pork Rinds        6 PM  Dinner:     Meat/fowl/fish with a green salad, and either broccoli, cauliflower, green beans, spinach, brussel sprouts or  Lima beans. DO NOT BREAD THE PROTEIN!!      There is a low carb pasta by Dreamfield's that is acceptable and tastes great: only 5 digestible carbs/serving.( All grocery stores but [employer]Js carry it )  Try Hurley Cisco Angelo's chicken piccata or chicken or eggplant parm over low carb pasta.(Lowes and BJs)   Marjory Lies Sanchez's  "Carnitas" (pulled pork, no sauce,  0 carbs) or his beef pot roast to make a dinner burrito (at [employer]J's)  Pesto over low carb pasta (bj's sells a good quality pesto in the center refrigerated section of the deli   Try satueeing  Cheral Marker with mushroooms  Whole wheat pasta is still full of digestible carbs and  Not as low in glycemic index as Dreamfield's.   Brown rice is still rice,  So skip the rice and noodles if you eat [place] or [place] (or at least limit to 1/2 cup)  9 PM snack :   Breyer's "low carb" fudgsicle or  ice cream bar (Carb Smart line), or  Weight Watcher's ice cream bar , or another "no sugar added" ice cream;  a serving of fresh berries/cherries with whipped cream   Cheese or DANNON'S LlGHT N FIT GREEK YOGURT or the [place] greek yogurt   8 ounces of [place] unsweetened almond/cococunut milk  Cheese and crackers (using WASA crackers,  They are low carb) or peanut butter on low carb crackers or pita bread     Avoid bananas, pineapple, grapes  and watermelon on a regular basis because they are high in sugar.  THINK OF THEM AS DESSERT  Remember that snack Substitutions should be less than 10 NET carbs per serving and meals should be < 25 net carbs. Remember that carbohydrates from fiber do not affect blood sugar, so you can  subtract fiber grams to get the "net carbs " of any particular food item.    Health Maintenance Adopting a healthy lifestyle and getting preventive care can go a long way to promote health and wellness. Talk with your health care provider about what schedule of regular examinations is right for you. This is a good chance for you to check in with your provider about disease prevention and staying healthy. In between checkups, there are plenty of things you can do on your own. Experts have done a lot of research about which lifestyle changes and preventive measures are most likely to keep you healthy. Ask your health care provider for more information. WEIGHT AND  DIET  Eat a healthy diet  Be sure to include plenty of vegetables, fruits, low-fat dairy products, and lean protein.  Do not eat a lot of foods high in solid fats, added sugars, or salt.  Get regular exercise. This is one of the most important things you can do for your health.  Most adults should exercise for at least 150  minutes each week. The exercise should increase your heart rate and make you sweat (moderate-intensity exercise).  Most adults should also do strengthening exercises at least twice a week. This is in addition to the moderate-intensity exercise.  Maintain a healthy weight  Body mass index (BMI) is a measurement that can be used to identify possible weight problems. It estimates body fat based on height and weight. Your health care provider can help determine your BMI and help you achieve or maintain a healthy weight.  For females 34 years of age and older:   A BMI below 18.5 is considered underweight.  A BMI of 18.5 to 24.9 is normal.  A BMI of 25 to 29.9 is considered overweight.  A BMI of 30 and above is considered obese.  Watch levels of cholesterol and blood lipids  You should start having your blood tested for lipids and cholesterol at 70 years of age, then have this test every 5 years.  You may need to have your cholesterol levels checked more often if:  Your lipid or cholesterol levels are high.  You are older than 70 years of age.  You are at high risk for heart disease.  CANCER SCREENING   Lung Cancer  Lung cancer screening is recommended for adults 39-41 years old who are at high risk for lung cancer because of a history of smoking.  A yearly low-dose CT scan of the lungs is recommended for people who:  Currently smoke.  Have quit within the past 15 years.  Have at least a 30-pack-year history of smoking. A pack year is smoking an average of one pack of cigarettes a day for 1 year.  Yearly screening should continue until it has been  15 years since you quit.  Yearly screening should stop if you develop a health problem that would prevent you from having lung cancer treatment.  Breast Cancer  Practice breast self-awareness. This means understanding how your breasts normally appear and feel.  It also means doing regular breast self-exams. Let your health care provider know about any changes, no matter how small.  If you are in your 20s or 30s, you should have a clinical breast exam (CBE) by a health care provider every 1-3 years as part of a regular health exam.  If you are 72 or older, have a CBE every year. Also consider having a breast X-ray (mammogram) every year.  If you have a family history of breast cancer, talk to your health care provider about genetic screening.  If you are at high risk for breast cancer, talk to your health care provider about having an MRI and a mammogram every year.  Breast cancer gene (BRCA) assessment is recommended for women who have family members with BRCA-related cancers. BRCA-related cancers include:  Breast.  Ovarian.  Tubal.  Peritoneal cancers.  Results of the assessment will determine the need for genetic counseling and BRCA1 and BRCA2 testing. Cervical Cancer Routine pelvic examinations to screen for cervical cancer are no longer recommended for nonpregnant women who are considered low risk for cancer of the pelvic organs (ovaries, uterus, and vagina) and who do not have symptoms. A pelvic examination may be necessary if you have symptoms including those associated with pelvic infections. Ask your health care provider if a screening pelvic exam is right for you.   The Pap test is the screening test for cervical cancer for women who are considered at risk.  If you had a hysterectomy for a problem that  was not cancer or a condition that could lead to cancer, then you no longer need Pap tests.  If you are older than 65 years, and you have had normal Pap tests for the past  10 years, you no longer need to have Pap tests.  If you have had past treatment for cervical cancer or a condition that could lead to cancer, you need Pap tests and screening for cancer for at least 20 years after your treatment.  If you no longer get a Pap test, assess your risk factors if they change (such as having a new sexual partner). This can affect whether you should start being screened again.  Some women have medical problems that increase their chance of getting cervical cancer. If this is the case for you, your health care provider may recommend more frequent screening and Pap tests.  The human papillomavirus (HPV) test is another test that may be used for cervical cancer screening. The HPV test looks for the virus that can cause cell changes in the cervix. The cells collected during the Pap test can be tested for HPV.  The HPV test can be used to screen women 9 years of age and older. Getting tested for HPV can extend the interval between normal Pap tests from three to five years.  An HPV test also should be used to screen women of any age who have unclear Pap test results.  After 70 years of age, women should have HPV testing as often as Pap tests.  Colorectal Cancer  This type of cancer can be detected and often prevented.  Routine colorectal cancer screening usually begins at 70 years of age and continues through 70 years of age.  Your health care provider may recommend screening at an earlier age if you have risk factors for colon cancer.  Your health care provider may also recommend using home test kits to check for hidden blood in the stool.  A small camera at the end of a tube can be used to examine your colon directly (sigmoidoscopy or colonoscopy). This is done to check for the earliest forms of colorectal cancer.  Routine screening usually begins at age 68.  Direct examination of the colon should be repeated every 5-10 years through 70 years of age. However, you  may need to be screened more often if early forms of precancerous polyps or small growths are found. Skin Cancer  Check your skin from head to toe regularly.  Tell your health care provider about any new moles or changes in moles, especially if there is a change in a mole's shape or color.  Also tell your health care provider if you have a mole that is larger than the size of a pencil eraser.  Always use sunscreen. Apply sunscreen liberally and repeatedly throughout the day.  Protect yourself by wearing long sleeves, pants, a wide-brimmed hat, and sunglasses whenever you are outside. HEART DISEASE, DIABETES, AND HIGH BLOOD PRESSURE   Have your blood pressure checked at least every 1-2 years. High blood pressure causes heart disease and increases the risk of stroke.  If you are between 25 years and 4 years old, ask your health care provider if you should take aspirin to prevent strokes.  Have regular diabetes screenings. This involves taking a blood sample to check your fasting blood sugar level.  If you are at a normal weight and have a low risk for diabetes, have this test once every three years after 70 years of age.  If you are overweight and have a high risk for diabetes, consider being tested at a younger age or more often. PREVENTING INFECTION  Hepatitis B  If you have a higher risk for hepatitis B, you should be screened for this virus. You are considered at high risk for hepatitis B if:  You were born in a country where hepatitis B is common. Ask your health care provider which countries are considered high risk.  Your parents were born in a high-risk country, and you have not been immunized against hepatitis B (hepatitis B vaccine).  You have HIV or AIDS.  You use needles to inject street drugs.  You live with someone who has hepatitis B.  You have had sex with someone who has hepatitis B.  You get hemodialysis treatment.  You take certain medicines for conditions,  including cancer, organ transplantation, and autoimmune conditions. Hepatitis C  Blood testing is recommended for:  Everyone born from [birth year] through 1966.  Anyone with known risk factors for hepatitis C. Sexually transmitted infections (STIs)  You should be screened for sexually transmitted infections (STIs) including gonorrhea and chlamydia if:  You are sexually active and are younger than 70 years of age.  You are older than 70 years of age and your health care provider tells you that you are at risk for this type of infection.  Your sexual activity has changed since you were last screened and you are at an increased risk for chlamydia or gonorrhea. Ask your health care provider if you are at risk.  If you do not have HIV, but are at risk, it may be recommended that you take a prescription medicine daily to prevent HIV infection. This is called pre-exposure prophylaxis (PrEP). You are considered at risk if:  You are sexually active and do not regularly use condoms or know the HIV status of your partner(s).  You take drugs by injection.  You are sexually active with a partner who has HIV. Talk with your health care provider about whether you are at high risk of being infected with HIV. If you choose to begin PrEP, you should first be tested for HIV. You should then be tested every 3 months for as long as you are taking PrEP.  PREGNANCY   If you are premenopausal and you may become pregnant, ask your health care provider about preconception counseling.  If you may become pregnant, take 400 to 800 micrograms (mcg) of folic acid every day.  If you want to prevent pregnancy, talk to your health care provider about birth control (contraception). OSTEOPOROSIS AND MENOPAUSE   Osteoporosis is a disease in which the bones lose minerals and strength with aging. This can result in serious bone fractures. Your risk for osteoporosis can be identified using a bone density scan.  If you are 22  years of age or older, or if you are at risk for osteoporosis and fractures, ask your health care provider if you should be screened.  Ask your health care provider whether you should take a calcium or vitamin D supplement to lower your risk for osteoporosis.  Menopause may have certain physical symptoms and risks.  Hormone replacement therapy may reduce some of these symptoms and risks. Talk to your health care provider about whether hormone replacement therapy is right for you.  HOME CARE INSTRUCTIONS   Schedule regular health, dental, and eye exams.  Stay current with your immunizations.   Do not use any tobacco products including cigarettes, chewing tobacco, or  electronic cigarettes.  If you are pregnant, do not drink alcohol.  If you are breastfeeding, limit how much and how often you drink alcohol.  Limit alcohol intake to no more than 1 drink per day for nonpregnant women. One drink equals 12 ounces of beer, 5 ounces of wine, or 1 ounces of hard liquor.  Do not use street drugs.  Do not share needles.  Ask your health care provider for help if you need support or information about quitting drugs.  Tell your health care provider if you often feel depressed.  Tell your health care provider if you have ever been abused or do not feel safe at home. Document Released: 07/18/2010 Document Revised: 05/19/2013 Document Reviewed: 12/04/2012 [facility] Patient Information 2015 [place], [place]. This information is not intended to replace advice given to you by your health care provider. Make sure you discuss any questions you have with your health care provider.

## 2014-01-29 NOTE — Progress Notes (Signed)
Patient ID: [name], female   DOB: July 16, 1945, 69 y.o.   MRN: [id number]   The patient is here for annual Medicare wellness examination and management of other chronic and acute problems, including OSA with CPAP,  Hypertension, obesity and metabolic syndrome. She has been wearing CPAP for several years but is concerned that her machine is starting to malfunction,  States that it hesitates when she turns it on.   She is wearing her CPAP every night a minimum of 6 hours per night at a pressure setting of 10.5 mm H20 and notes improved daytime wakefulness and decreased fatigue   Recent labs reviewed and discussed.  fasting glucose 109,  Not exercising due to right  medial ankle OA managed by [facility].   Worried about memory loss .  Forgetting names,  Having loss of short term memory aggravated by emotional duress of her daughter's marital problems and subsequent decompensation into an  Unhealthy lifestyle .  She is caring for her 79 yr old granddaughter from [place] thru Friday and often ends up in arguments  with her daughter, at which time she has had transient numbness of the left side of her dace ,   tightness in throat and sternum, and fingers going numb.  The symptoms have been occurring only with emotional stress not with exertion.  She has been having self described panic attacks.  During one such episode her Sister gave her 0.25 mg alprazolam and all symptoms  resolved.  They are occurring an average of Once a week.  She recalls a prior trial of zoloft taken briefly after her father's death which was not tolerated,  Made her feel numb    The risk factors are reflected in the social history.  The roster of all physicians providing medical care to patient - is listed in the Snapshot section of the chart.  Activities of daily living:  The patient is 100% independent in all ADLs: dressing, toileting, feeding as well as independent mobility  Home safety : The patient has smoke detectors in the home. They  wear seatbelts.  There are no firearms at home. There is no violence in the home.   There is no risks for hepatitis, STDs or HIV. There is no   history of blood transfusion. They have no travel history to infectious disease endemic areas of the world.  The patient has seen their dentist in the last six month. They have seen their eye doctor in the last year. They admit to slight hearing difficulty with regard to whispered voices and some television programs.  They have deferred audiologic testing in the last year.  They do not  have excessive sun exposure. Discussed the need for sun protection: hats, long sleeves and use of sunscreen if there is significant sun exposure.   Diet: the importance of a healthy diet is discussed. They do have a healthy diet.  The benefits of regular aerobic exercise were discussed. She walks 4 times per week ,  20 minutes.   Depression screen: there are no signs or vegative symptoms of depression- irritability, change in appetite, anhedonia, sadness/tearfullness.  Cognitive assessment: the patient manages all their financial and personal affairs and is actively engaged. They could relate day,date,year and events; recalled 2/3 objects at 3 minutes; performed clock-face test normally.  The following portions of the patient's history were reviewed and updated as appropriate: allergies, current medications, past family history, past medical history,  past surgical history, past social history  and problem list.  Visual acuity was not assessed per patient preference since she has regular follow up with her ophthalmologist. Hearing and body mass index were assessed and reviewed.   During the course of the visit the patient was educated and counseled about appropriate screening and preventive services including : fall prevention , diabetes screening, nutrition counseling, colorectal cancer screening, and recommended immunizations.    Review of Systems:  Patient denies  headache, fevers, malaise, unintentional weight loss, skin rash, eye pain, sinus congestion and sinus pain, sore throat, dysphagia,  hemoptysis , cough, dyspnea, wheezing, chest pain , palpitations, orthopnea, edema, abdominal pain, nausea, melena, diarrhea, constipation, flank pain, dysuria, hematuria, urinary  Frequency, nocturia, numbness, tingling, seizures,  Focal weakness, Loss of consciousness,  Tremor, insomnia, depression,and suicidal ideation.     Objective:  BP 138/78 mmHg  Pulse 74  Temp(Src) 97.6 F (36.4 C) (Oral)  Resp 16  Ht 5\' 6"  (1.676 m)  Wt 207 lb (93.895 kg)  BMI 33.43 kg/m2  SpO2 97% General appearance: alert, cooperative and appears stated age Head: Normocephalic, without obvious abnormality, atraumatic Eyes: conjunctivae/corneas clear. PERRL, EOM's intact. Fundi benign. Ears: normal TM's and external ear canals both ears Nose: Nares normal. Septum midline. Mucosa normal. No drainage or sinus tenderness. Throat: lips, mucosa, and tongue normal; teeth and gums normal Neck: no adenopathy, no carotid bruit, no JVD, supple, symmetrical, trachea midline and thyroid not enlarged, symmetric, no tenderness/mass/nodules Lungs: clear to auscultation bilaterally Breasts: normal appearance, no masses or tenderness Heart: regular rate and rhythm, S1, S2 normal, no murmur, click, rub or gallop Abdomen: soft, non-tender; bowel sounds normal; no masses,  no organomegaly Extremities: extremities normal, atraumatic, no cyanosis or edema Pulses: 2+ and symmetric Skin: Skin color, texture, turgor normal. No rashes or lesions Neurologic: Alert and oriented X 3, normal strength and tone. Normal symmetric reflexes. Normal coordination and gait.    Assessment and plan:  Problem List Items Addressed This Visit    Encounter for initial preventive physical examination covered by Medicare    Annual Medicare wellness  exam was done as well as a comprehensive physical exam and management  of acute and chronic conditions .  During the course of the visit the patient was educated and counseled about appropriate screening and preventive services including : fall prevention , diabetes screening, nutrition counseling, colorectal cancer screening, and recommended immunizations.  Printed recommendations for health maintenance screenings was given.       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  Lab Results  Component Value Date   CHOL 183 01/27/2014   HDL 40.60 01/27/2014   LDLCALC 112* 01/27/2014   TRIG 152.0* 01/27/2014   CHOLHDL 5 01/27/2014   Lab Results  Component Value Date   ALT 18 01/27/2014   AST 22 01/27/2014   ALKPHOS 67 01/27/2014   BILITOT 1.6* 01/27/2014         Hypertension    Well controlled on current regimen. Renal function stable, no changes today.  Lab Results  Component Value Date   CREATININE 0.8 01/27/2014   Lab Results  Component Value Date   NA 136 01/27/2014   K 4.5 01/27/2014   CL 103 01/27/2014   CO2 27 01/27/2014         Obesity (BMI 30-39.9)    I have addressed  BMI and recommended a low glycemic index diet utilizing smaller more  frequent meals to increase metabolism.  I have also recommended that patient start exercising with a goal of 30 minutes of aerobic exercise a minimum of 5 days per week.      OSA on CPAP    .Diagnosed by prior remote sleep study. She is wearing her CPAP every night a minimum of 6 hours per night and notes improved daytime wakefulness and decreased fatigue .  Her setting is 10.5 mm H20.  She is requesting a new machine.       Panic attack as reaction to stress - Primary    Spent more than half of today's visit , 30 minutes total, discussing the source of her anxiety and her sympotms.  Reassurance provided.  Memory tested and found to vbe excellent scoring MMSE 30/30.  Alprazolam  prn. The risks and benefits of benzodiazepine use were discussed with patient today including excessive sedation leading to respiratory depression,  impaired thinking/driving, and addiction.  Patient was advised to avoid concurrent use with alcohol, to use medication only as needed and not to share with others  .        Relevant Medications   ALPRAZolam  Prudy Feeler) tablet    Other Visit Diagnoses    Need for prophylactic vaccination against Streptococcus pneumoniae (pneumococcus)        Relevant Orders    Pneumococcal conjugate vaccine 13-valent (Completed)

## 2014-01-29 NOTE — Progress Notes (Signed)
Pre-visit discussion using our clinic review tool. No additional management support is needed unless otherwise documented below in the visit note.  

## 2014-01-30 DIAGNOSIS — F430 Acute stress reaction: Secondary | ICD-10-CM

## 2014-01-30 DIAGNOSIS — F410 Panic disorder [episodic paroxysmal anxiety] without agoraphobia: Secondary | ICD-10-CM | POA: Insufficient documentation

## 2014-01-30 NOTE — Assessment & Plan Note (Signed)
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  Lab Results  Component Value Date   CHOL 183 01/27/2014   HDL 40.60 01/27/2014   LDLCALC 112* 01/27/2014   TRIG 152.0* 01/27/2014   CHOLHDL 5 01/27/2014   Lab Results  Component Value Date   ALT 18 01/27/2014   AST 22 01/27/2014   ALKPHOS 67 01/27/2014   BILITOT 1.6* 01/27/2014

## 2014-01-30 NOTE — Assessment & Plan Note (Signed)
Spent more than half of today's visit , 30 minutes total, discussing the source of her anxiety and her sympotms.  Reassurance provided.  Memory tested and found to vbe excellent scoring MMSE 30/30.  Alprazolam prn. The risks and benefits of benzodiazepine use were discussed with patient today including excessive sedation leading to respiratory depression,  impaired thinking/driving, and addiction.  Patient was advised to avoid concurrent use with alcohol, to use medication only as needed and not to share with others  .

## 2014-01-30 NOTE — Assessment & Plan Note (Signed)
Well controlled on current regimen. Renal function stable, no changes today.  Lab Results  Component Value Date   CREATININE 0.8 01/27/2014   Lab Results  Component Value Date   NA 136 01/27/2014   K 4.5 01/27/2014   CL 103 01/27/2014   CO2 27 01/27/2014

## 2014-01-30 NOTE — Assessment & Plan Note (Signed)

## 2014-01-30 NOTE — Assessment & Plan Note (Signed)
.  Diagnosed by prior remote sleep study. She is wearing her CPAP every night a minimum of 6 hours per night and notes improved daytime wakefulness and decreased fatigue .  Her setting is 10.5 mm H20.  She is requesting a new machine.

## 2014-01-30 NOTE — Assessment & Plan Note (Signed)
I have addressed  BMI and recommended a low glycemic index diet utilizing smaller more frequent meals to increase metabolism.  I have also recommended that patient start exercising with a goal of 30 minutes of aerobic exercise a minimum of 5 days per week.  

## 2014-02-02 ENCOUNTER — Ambulatory Visit (INDEPENDENT_AMBULATORY_CARE_PROVIDER_SITE_OTHER): Payer: Medicare Other | Admitting: Podiatry

## 2014-02-02 ENCOUNTER — Ambulatory Visit (INDEPENDENT_AMBULATORY_CARE_PROVIDER_SITE_OTHER): Payer: Medicare Other

## 2014-02-02 ENCOUNTER — Ambulatory Visit: Payer: Self-pay | Admitting: Podiatry

## 2014-02-02 ENCOUNTER — Encounter: Payer: Self-pay | Admitting: Podiatry

## 2014-02-02 VITALS — Ht 66.0 in | Wt 210.0 lb

## 2014-02-02 DIAGNOSIS — M1990 Unspecified osteoarthritis, unspecified site: Secondary | ICD-10-CM

## 2014-02-02 DIAGNOSIS — M76821 Posterior tibial tendinitis, right leg: Secondary | ICD-10-CM

## 2014-02-02 MED ORDER — METHYLPREDNISOLONE (PAK) 4 MG PO TABS: ORAL_TABLET | ORAL | Status: DC

## 2014-02-02 NOTE — Progress Notes (Signed)
   Subjective:    Patient ID: Maria JobeTerressa Fletcher, female    DOB: 01/21/1945, 69 y.o.   MRN: [id number]  HPI Comments: For several months now on my right foot , medial side of the ankle. Does not hurt everyday it hurts after a lot of walking or activity it acts up, now this morning it is not hurting so bad   Foot Pain      Review of Systems  All other systems reviewed and are negative.      Objective:   Physical Exam: I have reviewed her past medical history medications allergies surgery social history and review of systems. Pulses are strongly palpable bilateral. Neurologic sensorium is intact per Semmes-Weinstein monofilament. Deep tendon reflexes are intact bilaterally muscle strength +5 over 5 dorsiflexion and plantar flexors and inverters and evertors on his musculature is intact. Orthopedic evaluation does demonstrates mild pes planus with collapse of medial longitudinal arch and soft tissue increase in density of the soft tissue around the posterior tibial tendon as it courses beneath the medial malleolus there is also fluctuance in this area. Radiographs demonstrate a normal ankle mortise relatively normal looking foot with mild pronation.        Assessment & Plan:  Assessment: Posterior tibial tendinitis right. Pes planus.  Plan: Injected today with dexamethasone and local anesthetic. I also placed her on a Medrol Dosepak as well as Modic I will follow up with her in 1 month

## 2014-03-16 ENCOUNTER — Ambulatory Visit (INDEPENDENT_AMBULATORY_CARE_PROVIDER_SITE_OTHER): Payer: Medicare Other | Admitting: Podiatry

## 2014-03-16 ENCOUNTER — Encounter: Payer: Self-pay | Admitting: Podiatry

## 2014-03-16 VITALS — BP 157/87 | HR 80 | Resp 16

## 2014-03-16 DIAGNOSIS — G5791 Unspecified mononeuropathy of right lower limb: Secondary | ICD-10-CM | POA: Diagnosis not present

## 2014-03-16 NOTE — Progress Notes (Signed)
She presents today for follow-up of her posterior tibial tendinitis which she states is much better. She states now that she is having pain it seems to be located here, as she points to the saphenous nerve area. She states that the pain does not radiate and it stays pretty localized. It does not hurt all the time.  Objective: Vital signs are stable alert and oriented 3. Much decrease in fluctuance to the posterior tibial tendon as it courses beneath the medial malleolus right foot. She does have tenderness on palpation of the saphenous nerve and the greater saphenous vein area but it does not extend into the ankle or on to the medial malleolus. Previous radiographs were reevaluated.  Assessment: Saphenous nerve neuritis cam rule out a focal tibialis anterior tendinitis right foot.  Plan: Injected with dexamethasone and local anesthetic point of maximal tenderness today saphenous nerve at the level of the ankle.

## 2014-04-20 ENCOUNTER — Ambulatory Visit: Payer: Medicare Other | Admitting: Podiatry

## 2014-07-29 ENCOUNTER — Other Ambulatory Visit: Payer: Medicare Other

## 2014-07-31 ENCOUNTER — Ambulatory Visit: Payer: Medicare Other | Admitting: Internal Medicine

## 2014-10-12 ENCOUNTER — Other Ambulatory Visit (INDEPENDENT_AMBULATORY_CARE_PROVIDER_SITE_OTHER): Payer: Medicare Other

## 2014-10-12 ENCOUNTER — Telehealth: Payer: Self-pay | Admitting: *Deleted

## 2014-10-12 DIAGNOSIS — E559 Vitamin D deficiency, unspecified: Secondary | ICD-10-CM

## 2014-10-12 DIAGNOSIS — E669 Obesity, unspecified: Secondary | ICD-10-CM

## 2014-10-12 DIAGNOSIS — E785 Hyperlipidemia, unspecified: Secondary | ICD-10-CM | POA: Diagnosis not present

## 2014-10-12 DIAGNOSIS — K760 Fatty (change of) liver, not elsewhere classified: Secondary | ICD-10-CM

## 2014-10-12 LAB — COMPREHENSIVE METABOLIC PANEL
ALT: 19 U/L (ref 0–35)
AST: 17 U/L (ref 0–37)
Albumin: 3.8 g/dL (ref 3.5–5.2)
Alkaline Phosphatase: 65 U/L (ref 39–117)
BUN: 15 mg/dL (ref 6–23)
CO2: 28 mEq/L (ref 19–32)
Calcium: 9 mg/dL (ref 8.4–10.5)
Chloride: 102 mEq/L (ref 96–112)
Creatinine, Ser: 0.77 mg/dL (ref 0.40–1.20)
GFR: 78.75 mL/min (ref >60.00)
Glucose, Bld: 119 mg/dL — ABNORMAL HIGH (ref 70–99)
Potassium: 4.5 mEq/L (ref 3.5–5.1)
Sodium: 139 mEq/L (ref 135–145)
Total Bilirubin: 0.8 mg/dL (ref 0.2–1.2)
Total Protein: 6.5 g/dL (ref 6.0–8.3)

## 2014-10-12 LAB — LIPID PANEL
Cholesterol: 165 mg/dL (ref 0–200)
HDL: 45.2 mg/dL (ref >39.00)
LDL Cholesterol: 83 mg/dL (ref 0–99)
NonHDL: 119.7
Total CHOL/HDL Ratio: 4
Triglycerides: 182 mg/dL — ABNORMAL HIGH (ref 0.0–149.0)
VLDL: 36.4 mg/dL (ref 0.0–40.0)

## 2014-10-12 LAB — HEMOGLOBIN A1C: Hgb A1c MFr Bld: 6.1 % (ref 4.6–6.5)

## 2014-10-12 LAB — TSH: TSH: 3.83 u[IU]/mL (ref 0.35–4.50)

## 2014-10-12 LAB — VITAMIN D 25 HYDROXY (VIT D DEFICIENCY, FRACTURES): VITD: 21.71 ng/mL — ABNORMAL LOW (ref 30.00–100.00)

## 2014-10-12 LAB — LDL CHOLESTEROL, DIRECT: Direct LDL: 113 mg/dL

## 2014-10-12 NOTE — Telephone Encounter (Signed)
Labs and dx?  

## 2014-10-14 ENCOUNTER — Other Ambulatory Visit: Payer: Self-pay | Admitting: Internal Medicine

## 2014-10-14 ENCOUNTER — Telehealth: Payer: Self-pay | Admitting: Internal Medicine

## 2014-10-14 MED ORDER — ERGOCALCIFEROL 1.25 MG (50000 UT) PO CAPS: 50000.0000 [IU] | ORAL_CAPSULE | ORAL | Status: DC

## 2014-10-14 NOTE — Telephone Encounter (Signed)
Pt called back returning your call. Thank you! °

## 2014-10-15 ENCOUNTER — Ambulatory Visit (INDEPENDENT_AMBULATORY_CARE_PROVIDER_SITE_OTHER): Payer: Medicare Other | Admitting: Internal Medicine

## 2014-10-15 ENCOUNTER — Encounter: Payer: Self-pay | Admitting: Internal Medicine

## 2014-10-15 VITALS — BP 140/88 | HR 71 | Temp 98.4°F | Resp 12 | Ht 66.0 in | Wt 218.2 lb

## 2014-10-15 DIAGNOSIS — Z23 Encounter for immunization: Secondary | ICD-10-CM

## 2014-10-15 DIAGNOSIS — R300 Dysuria: Secondary | ICD-10-CM

## 2014-10-15 DIAGNOSIS — K760 Fatty (change of) liver, not elsewhere classified: Secondary | ICD-10-CM | POA: Diagnosis not present

## 2014-10-15 DIAGNOSIS — R1030 Lower abdominal pain, unspecified: Secondary | ICD-10-CM

## 2014-10-15 DIAGNOSIS — R339 Retention of urine, unspecified: Secondary | ICD-10-CM

## 2014-10-15 DIAGNOSIS — R102 Pelvic and perineal pain: Secondary | ICD-10-CM | POA: Insufficient documentation

## 2014-10-15 DIAGNOSIS — I10 Essential (primary) hypertension: Secondary | ICD-10-CM

## 2014-10-15 DIAGNOSIS — E669 Obesity, unspecified: Secondary | ICD-10-CM

## 2014-10-15 LAB — URINALYSIS, ROUTINE W REFLEX MICROSCOPIC
Bilirubin Urine: NEGATIVE
Ketones, ur: NEGATIVE
Leukocytes, UA: NEGATIVE
Nitrite: NEGATIVE
Specific Gravity, Urine: 1.02 (ref 1.000–1.030)
Total Protein, Urine: NEGATIVE
Urine Glucose: NEGATIVE
Urobilinogen, UA: 0.2 (ref 0.0–1.0)
pH: 5.5 (ref 5.0–8.0)

## 2014-10-15 LAB — POCT URINALYSIS DIPSTICK
Bilirubin, UA: NEGATIVE
Glucose, UA: NEGATIVE
Ketones, UA: NEGATIVE
Leukocytes, UA: NEGATIVE
Nitrite, UA: NEGATIVE
Protein, UA: NEGATIVE
Spec Grav, UA: 1.015
Urobilinogen, UA: 0.2
pH, UA: 5.5

## 2014-10-15 NOTE — Patient Instructions (Addendum)
Stop the benadryl and use Zyrtec for itching  Bladder ultrasound to see if the bladder empties like it should  UA is still pending to see if there is blood in it    Return in one month for Hepatitis A and B vaccine #2  Return in 6 month to see me and get Vaccine #3

## 2014-10-15 NOTE — Assessment & Plan Note (Signed)
I have addressed  BMI and recommended a low glycemic index diet utilizing smaller more frequent meals to increase metabolism.  I have also recommended that patient find some form of exercise that she can participate in regularly for  a goal of 30 minutes of aerobic exercise a minimum of 5 days per week.

## 2014-10-15 NOTE — Progress Notes (Signed)
Pre-visit discussion using our clinic review tool. No additional management support is needed unless otherwise documented below in the visit note.  

## 2014-10-15 NOTE — Assessment & Plan Note (Signed)
Well controlled on current regimen. Renal function stable, no changes today.  Lab Results  Component Value Date   CREATININE 0.77 10/12/2014   Lab Results  Component Value Date   NA 139 10/12/2014   K 4.5 10/12/2014   CL 102 10/12/2014   CO2 28 10/12/2014

## 2014-10-15 NOTE — Progress Notes (Signed)
Subjective:  Patient ID: [name], female    DOB: 1945-03-18  Age: 69 y.o. MRN: [id number]  CC: The primary encounter diagnosis was Dysuria. Diagnoses of Urinary retention, Suprapubic pain, unspecified laterality, Encounter for immunization, Fatty liver, Obesity (BMI 30-39.9), and Essential hypertension were also pertinent to this visit.  HPI [name] presents for REGULAR FOLLOW UP ON OBSITRY,  FATTY LIVER,    [phone] CELL PHONE   new onset suprapubic discomfort and and decreased urinary output .  Symptoms started after eating at [employer]   About  15 days ago,  Started the folowing evening and lasted 36 hours,  Took nothing ,  Had a lot of burping and bloating  And severe cramps which lasted 9 days off and on.  Aggravated by eating so she changed diet to simplified with sweet potatois and soup for a few days .  Bowels returned to normal at 48 hours  Moving daily.  Bladder voids have been small excdept for morning coffee and has been having intermittent supropubic pain  Stabbing and severe,  Like birthpains.   has ben taking benadryl for itching once daily for the last several week   No history of divertculitis  or of kidney stones.  f   Outpatient Prescriptions Prior to Visit  Medication Sig Dispense Refill  . albuterol (PROAIR HFA) 108 (90 BASE) MCG/ACT inhaler Inhale 2 puffs into the lungs daily as needed.      . ALPRAZolam (XANAX) 0.25 MG tablet Take 1 tablet (0.25 mg total) by mouth 2 (two) times daily as needed for anxiety. 20 tablet 0  . b complex vitamins tablet Take 1 tablet by mouth daily.    . beclomethasone (QVAR) 80 MCG/ACT inhaler Inhale 1 puff into the lungs daily as needed.      . bimatoprost (LUMIGAN) 0.03 % ophthalmic solution Place 1 drop into both eyes at bedtime.    . cetirizine (ZYRTEC) 10 MG tablet Take 10 mg by mouth daily.      . clobetasol (OLUX) 0.05 % topical foam Apply topically as needed.      . desonide (DESOWEN) 0.05 % ointment Apply topically  as needed.      . diphenhydrAMINE (BENADRYL) 25 mg capsule Take 25 mg by mouth every 8 (eight) hours as needed.    . ergocalciferol (DRISDOL) 50000 UNITS capsule Take 1 capsule (50,000 Units total) by mouth once a week. 12 capsule 0  . Ginkgo Biloba 120 MG CAPS Take 1 capsule by mouth daily.    . Multiple Vitamins-Minerals (CENTRUM SILVER PO) Take by mouth daily.      . Probiotic Product (PROBIOTIC COMPLEX ACIDOPHILUS PO) Take 1 capsule by mouth daily.    Marland Kitchen RESVERATROL PO Take 100 mcg by mouth daily.    . sodium chloride (OCEAN) 0.65 % SOLN nasal spray Place 1 spray into the nose as needed.      . TURMERIC CURCUMIN PO Take 1 capsule by mouth daily.    Marland Kitchen aspirin EC 81 MG tablet Take 1 tablet (81 mg total) by mouth daily. (Patient not taking: Reported on 01/29/2014) 30 tablet 11  . azelastine (ASTELIN) 137 MCG/SPRAY nasal spray Place 1 spray into the nose daily. Use in each nostril as directed     . cyanocobalamin (,VITAMIN B-12,) 1000 MCG/ML injection INJECT 1 ML INTO THE MUSCLE EVERY 30 (THIRTY) DAYS. (Patient not taking: Reported on 10/15/2014) 10 mL 0  . ergocalciferol (DRISDOL) 50000 UNITS capsule Take 1 capsule (50,000 Units  total) by mouth once a week. 12 capsule 0  . folic acid (FOLVITE) 400 MCG tablet Take 400 mcg by mouth daily.    . meloxicam (MOBIC) 15 MG tablet Take 15 mg by mouth daily as needed.     . promethazine (PHENERGAN) 12.5 MG tablet Take 1 tablet (12.5 mg total) by mouth every 6 (six) hours as needed for nausea. (Patient not taking: Reported on 01/29/2014) 30 tablet 1  . Red Yeast Rice 600 MG CAPS Take 1 capsule by mouth daily.    . SYRINGE-NEEDLE, DISP, 3 ML (B-D 3CC LUER-LOK SYR 25GX1") 25G X 1" 3 ML MISC USE AS DIRECTED 15 each 0   No facility-administered medications prior to visit.    Review of Systems;  Patient denies headache, fevers, malaise, unintentional weight loss, skin rash, eye pain, sinus congestion and sinus pain, sore throat, dysphagia,  hemoptysis , cough,  dyspnea, wheezing, chest pain, palpitations, orthopnea, edema, abdominal pain, nausea, melena, diarrhea, constipation, flank pain, dysuria, hematuria, urinary  Frequency, nocturia, numbness, tingling, seizures,  Focal weakness, Loss of consciousness,  Tremor, insomnia, depression, anxiety, and suicidal ideation.      Objective:  BP 140/88 mmHg  Pulse 71  Temp(Src) 98.4 F (36.9 C) (Oral)  Resp 12  Ht 5\' 6"  (1.676 m)  Wt 218 lb 4 oz (98.998 kg)  BMI 35.24 kg/m2  SpO2 96%  BP Readings from Last 3 Encounters:  10/15/14 140/88  03/16/14 157/87  01/29/14 138/78    Wt Readings from Last 3 Encounters:  10/15/14 218 lb 4 oz (98.998 kg)  02/02/14 210 lb (95.255 kg)  01/29/14 207 lb (93.895 kg)    General appearance: alert, cooperative and appears stated age Ears: normal TM's and external ear canals both ears Throat: lips, mucosa, and tongue normal; teeth and gums normal Neck: no adenopathy, no carotid bruit, supple, symmetrical, trachea midline and thyroid not enlarged, symmetric, no tenderness/mass/nodules Back: symmetric, no curvature. ROM normal. No CVA tenderness. Lungs: clear to auscultation bilaterally Heart: regular rate and rhythm, S1, S2 normal, no murmur, click, rub or gallop Abdomen: soft, non-tender; bowel sounds normal; no masses,  no organomegaly Pulses: 2+ and symmetric Skin: Skin color, texture, turgor normal. No rashes or lesions Lymph nodes: Cervical, supraclavicular, and axillary nodes normal.  Lab Results  Component Value Date   HGBA1C 6.1 10/12/2014   HGBA1C 6.1* 07/23/2010    Lab Results  Component Value Date   CREATININE 0.77 10/12/2014   CREATININE 0.8 01/27/2014   CREATININE 0.9 10/21/2012    Lab Results  Component Value Date   WBC 10.0 01/27/2014   HGB 13.2 01/27/2014   HCT 39.7 01/27/2014   PLT 326.0 01/27/2014   GLUCOSE 119* 10/12/2014   CHOL 165 10/12/2014   TRIG 182.0* 10/12/2014   HDL 45.20 10/12/2014   LDLDIRECT 113.0 10/12/2014    LDLCALC 83 10/12/2014   ALT 19 10/12/2014   AST 17 10/12/2014   NA 139 10/12/2014   K 4.5 10/12/2014   CL 102 10/12/2014   CREATININE 0.77 10/12/2014   BUN 15 10/12/2014   CO2 28 10/12/2014   TSH 3.83 10/12/2014   INR 1.1 07/27/2007   HGBA1C 6.1 10/12/2014    Mm Screening Breast Tomo Bilateral  12/01/2013   CLINICAL DATA:  Screening.  EXAM: DIGITAL SCREENING BILATERAL MAMMOGRAM WITH 3D TOMO WITH CAD  COMPARISON:  11/15/2012, 11/13/2011, 11/11/2010 and 10/20/2009  ACR Breast Density Category b: There are scattered areas of fibroglandular density.  FINDINGS: There are no findings suspicious  for malignancy. Images were processed with CAD.  IMPRESSION: No mammographic evidence of malignancy. A result letter of this screening mammogram will be mailed directly to the patient.  RECOMMENDATION: Screening mammogram in one year. (Code:SM-B-01Y)  BI-RADS CATEGORY  1: Negative.   Electronically Signed   By: Fannie Knee   On: 12/01/2013 12:34    Assessment & Plan:   Problem List Items Addressed This Visit    Fatty liver    Presumed by ultrasound changes and serologies negative for autoimmune causes of hepatitis.  Current liver enzymes are normal and all modifiable risk factors were discussed with patient today including obesity, metabolic syndrome and hyperlipidemia . First Hep A/B vaccine of 3 part series given.   Lab Results  Component Value Date   ALT 19 10/12/2014   AST 17 10/12/2014   ALKPHOS 65 10/12/2014   BILITOT 0.8 10/12/2014         Relevant Orders   Hepatitis A hepatitis B combined vaccine IM (Completed)   Obesity (BMI 30-39.9)    I have addressed  BMI and recommended a low glycemic index diet utilizing smaller more frequent meals to increase metabolism.  I have also recommended that patient find some form of exercise that she can participate in regularly for  a goal of 30 minutes of aerobic exercise a minimum of 5 days per week.        Hypertension    Well controlled  on current regimen. Renal function stable, no changes today.  Lab Results  Component Value Date   CREATININE 0.77 10/12/2014   Lab Results  Component Value Date   NA 139 10/12/2014   K 4.5 10/12/2014   CL 102 10/12/2014   CO2 28 10/12/2014         RESOLVED: Dysuria - Primary   Relevant Orders   POCT Urinalysis Dipstick (Completed)   Urine Culture   Urinalysis, Routine w reflex microscopic (Completed)   Suprapubic pain    No evidence of UTI  But she has retention and hematuria in dipstick needs further evaluation with  pelvic ultrasound and formal UA ordered.       Relevant Orders   US Pelvis Limited   Urinary retention    May be secondary to benadryl use (chronic for itching).  Medication stopped,  Limited pelvic ultrasound ordered to evaluate bladder size       Relevant Orders   US Pelvis Limited    Other Visit Diagnoses    Encounter for immunization           I have discontinued Ms. Falwell's azelastine, meloxicam, promethazine, folic acid, aspirin EC, SYRINGE-NEEDLE (DISP) 3 ML, and Red Yeast Rice. I am also having her maintain her cetirizine, beclomethasone, sodium chloride, albuterol, Multiple Vitamins-Minerals (CENTRUM SILVER PO), clobetasol, desonide, Probiotic Product (PROBIOTIC COMPLEX ACIDOPHILUS PO), b complex vitamins, Ginkgo Biloba, RESVERATROL PO, TURMERIC CURCUMIN PO, bimatoprost, diphenhydrAMINE, ALPRAZolam, ergocalciferol, and cyanocobalamin.  Meds ordered this encounter  Medications  . cyanocobalamin 1000 MCG tablet    Sig: Take 1,000 mcg by mouth daily.    Medications Discontinued During This Encounter  Medication Reason  . azelastine (ASTELIN) 137 MCG/SPRAY nasal spray Patient Preference  . cyanocobalamin (,VITAMIN B-12,) 1000 MCG/ML injection Change in therapy  . ergocalciferol (DRISDOL) 50000 UNITS capsule Duplicate  . folic acid (FOLVITE) 400 MCG tablet Patient Preference  . meloxicam (MOBIC) 15 MG tablet Patient Preference  . SYRINGE-NEEDLE,  DISP, 3 ML (B-D 3CC LUER-LOK SYR 25GX1") 25G X 1" 3 ML MISC Error  .  promethazine (PHENERGAN) 12.5 MG tablet Error  . Red Yeast Rice 600 MG CAPS Error  . promethazine (PHENERGAN) 12.5 MG tablet Error  . meloxicam (MOBIC) 15 MG tablet Patient Preference  . folic acid (FOLVITE) 400 MCG tablet Patient Preference  . ergocalciferol (DRISDOL) 50000 UNITS capsule Duplicate  . aspirin EC 81 MG tablet   . Red Yeast Rice 600 MG CAPS Error  . cyanocobalamin (,VITAMIN B-12,) 1000 MCG/ML injection Change in therapy  . SYRINGE-NEEDLE, DISP, 3 ML (B-D 3CC LUER-LOK SYR 25GX1") 25G X 1" 3 ML MISC Error   A total of 40 minutes was spent with patient more than half of which was spent in counseling patient on the above mentioned issues , reviewing and explaining recent labs and imaging studies done, and coordination of care.  Follow-up: Return in about 4 weeks (around 11/12/2014).   Sherlene Shams, MD

## 2014-10-15 NOTE — Assessment & Plan Note (Signed)
No evidence of UTI  But she has retention and hematuria in dipstick needs further evaluation with  pelvic ultrasound and formal UA ordered.

## 2014-10-15 NOTE — Assessment & Plan Note (Addendum)
Presumed by ultrasound changes and serologies negative for autoimmune causes of hepatitis.  Current liver enzymes are normal and all modifiable risk factors were discussed with patient today including obesity, metabolic syndrome and hyperlipidemia . First Hep A/B vaccine of 3 part series given.   Lab Results  Component Value Date   ALT 19 10/12/2014   AST 17 10/12/2014   ALKPHOS 65 10/12/2014   BILITOT 0.8 10/12/2014

## 2014-10-15 NOTE — Assessment & Plan Note (Signed)
May be secondary to benadryl use (chronic for itching).  Medication stopped,  Limited pelvic ultrasound ordered to evaluate bladder size

## 2014-10-16 ENCOUNTER — Ambulatory Visit
Admission: RE | Admit: 2014-10-16 | Discharge: 2014-10-16 | Disposition: A | Discharge status: Home / Self Care | Payer: Medicare Other | Source: Ambulatory Visit | Attending: Internal Medicine | Admitting: Internal Medicine

## 2014-10-16 DIAGNOSIS — R339 Retention of urine, unspecified: Secondary | ICD-10-CM | POA: Diagnosis not present

## 2014-10-16 DIAGNOSIS — R1030 Lower abdominal pain, unspecified: Secondary | ICD-10-CM

## 2014-10-23 ENCOUNTER — Other Ambulatory Visit: Payer: Self-pay

## 2014-10-23 DIAGNOSIS — Z1231 Encounter for screening mammogram for malignant neoplasm of breast: Secondary | ICD-10-CM

## 2014-11-12 ENCOUNTER — Ambulatory Visit (INDEPENDENT_AMBULATORY_CARE_PROVIDER_SITE_OTHER): Payer: Medicare Other

## 2014-11-12 DIAGNOSIS — Z23 Encounter for immunization: Secondary | ICD-10-CM | POA: Diagnosis not present

## 2014-11-12 NOTE — Progress Notes (Signed)
Patient came in for HepA/B second injection.  Confirmed with Dr. Darrick Huntsmanullo that patient can receive vaccine as the last one was 28days ago.  She agreed that vaccine was appropriate to give today.

## 2014-12-09 ENCOUNTER — Ambulatory Visit
Admission: RE | Admit: 2014-12-09 | Discharge: 2014-12-09 | Disposition: A | Discharge status: Home / Self Care | Payer: Medicare Other | Source: Ambulatory Visit

## 2014-12-09 DIAGNOSIS — Z1231 Encounter for screening mammogram for malignant neoplasm of breast: Secondary | ICD-10-CM

## 2014-12-14 ENCOUNTER — Encounter: Payer: Self-pay | Admitting: *Deleted

## 2015-04-12 ENCOUNTER — Other Ambulatory Visit (INDEPENDENT_AMBULATORY_CARE_PROVIDER_SITE_OTHER): Payer: Medicare Other

## 2015-04-12 ENCOUNTER — Other Ambulatory Visit: Payer: Medicare Other

## 2015-04-12 ENCOUNTER — Telehealth: Payer: Self-pay | Admitting: *Deleted

## 2015-04-12 DIAGNOSIS — I739 Peripheral vascular disease, unspecified: Secondary | ICD-10-CM

## 2015-04-12 DIAGNOSIS — E8881 Metabolic syndrome: Secondary | ICD-10-CM

## 2015-04-12 DIAGNOSIS — E538 Deficiency of other specified B group vitamins: Secondary | ICD-10-CM

## 2015-04-12 DIAGNOSIS — E785 Hyperlipidemia, unspecified: Secondary | ICD-10-CM

## 2015-04-12 DIAGNOSIS — E559 Vitamin D deficiency, unspecified: Secondary | ICD-10-CM

## 2015-04-12 LAB — COMPREHENSIVE METABOLIC PANEL
ALT: 24 U/L (ref 0–35)
AST: 21 U/L (ref 0–37)
Albumin: 4 g/dL (ref 3.5–5.2)
Alkaline Phosphatase: 65 U/L (ref 39–117)
BUN: 16 mg/dL (ref 6–23)
CO2: 30 mEq/L (ref 19–32)
Calcium: 9.4 mg/dL (ref 8.4–10.5)
Chloride: 103 mEq/L (ref 96–112)
Creatinine, Ser: 0.78 mg/dL (ref 0.40–1.20)
GFR: 77.48 mL/min (ref >60.00)
Glucose, Bld: 113 mg/dL — ABNORMAL HIGH (ref 70–99)
Potassium: 4.5 mEq/L (ref 3.5–5.1)
Sodium: 140 mEq/L (ref 135–145)
Total Bilirubin: 0.9 mg/dL (ref 0.2–1.2)
Total Protein: 6.8 g/dL (ref 6.0–8.3)

## 2015-04-12 LAB — MICROALBUMIN / CREATININE URINE RATIO
Creatinine,U: 41.8 mg/dL
Microalb Creat Ratio: 1.7 mg/g (ref 0.0–30.0)
Microalb, Ur: <0.7 mg/dL (ref 0.0–1.9)

## 2015-04-12 LAB — LIPID PANEL
Cholesterol: 177 mg/dL (ref 0–200)
HDL: 45.5 mg/dL (ref >39.00)
LDL Cholesterol: 96 mg/dL (ref 0–99)
NonHDL: 131.95
Total CHOL/HDL Ratio: 4
Triglycerides: 179 mg/dL — ABNORMAL HIGH (ref 0.0–149.0)
VLDL: 35.8 mg/dL (ref 0.0–40.0)

## 2015-04-12 LAB — VITAMIN B12: Vitamin B-12: 741 pg/mL (ref 211–911)

## 2015-04-12 LAB — VITAMIN D 25 HYDROXY (VIT D DEFICIENCY, FRACTURES): VITD: 27.63 ng/mL — ABNORMAL LOW (ref 30.00–100.00)

## 2015-04-12 LAB — HEMOGLOBIN A1C: Hgb A1c MFr Bld: 6.3 % (ref 4.6–6.5)

## 2015-04-12 NOTE — Telephone Encounter (Signed)
Labs and dx?  

## 2015-04-14 ENCOUNTER — Ambulatory Visit (INDEPENDENT_AMBULATORY_CARE_PROVIDER_SITE_OTHER): Payer: Medicare Other | Admitting: Internal Medicine

## 2015-04-14 VITALS — BP 138/78 | HR 78 | Temp 98.2°F | Resp 12 | Ht 66.0 in | Wt 222.5 lb

## 2015-04-14 DIAGNOSIS — R208 Other disturbances of skin sensation: Secondary | ICD-10-CM

## 2015-04-14 DIAGNOSIS — R200 Anesthesia of skin: Secondary | ICD-10-CM

## 2015-04-14 DIAGNOSIS — K760 Fatty (change of) liver, not elsewhere classified: Secondary | ICD-10-CM

## 2015-04-14 DIAGNOSIS — E669 Obesity, unspecified: Secondary | ICD-10-CM

## 2015-04-14 DIAGNOSIS — G4733 Obstructive sleep apnea (adult) (pediatric): Secondary | ICD-10-CM | POA: Diagnosis not present

## 2015-04-14 DIAGNOSIS — Z23 Encounter for immunization: Secondary | ICD-10-CM

## 2015-04-14 DIAGNOSIS — E8881 Metabolic syndrome: Secondary | ICD-10-CM | POA: Diagnosis not present

## 2015-04-14 DIAGNOSIS — Z9989 Dependence on other enabling machines and devices: Secondary | ICD-10-CM

## 2015-04-14 DIAGNOSIS — R079 Chest pain, unspecified: Secondary | ICD-10-CM | POA: Diagnosis not present

## 2015-04-14 DIAGNOSIS — E785 Hyperlipidemia, unspecified: Secondary | ICD-10-CM

## 2015-04-14 DIAGNOSIS — I209 Angina pectoris, unspecified: Secondary | ICD-10-CM

## 2015-04-14 DIAGNOSIS — G5790 Unspecified mononeuropathy of unspecified lower limb: Secondary | ICD-10-CM

## 2015-04-14 HISTORY — DX: Angina pectoris, unspecified: I20.9
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
## 2015-04-14 NOTE — Progress Notes (Signed)
Pre-visit discussion using our clinic review tool. No additional management support is needed unless otherwise documented below in the visit note.  

## 2015-04-14 NOTE — Patient Instructions (Signed)
Continue taking a baby aspirin daily.  Referral to Cardiology is underway for stress testing.   This is my "Low GI"  Diet:  Please reviewe it again to see if there are more ways to shave carbs off your diet   All of the foods can be found at grocery stores and in bulk at [employer].  The Atkins protein bars and shakes are available in more varieties at Target, WalMart and [employer]'s Foods.     7 AM Breakfast:  Choose from the following:  Low carbohydrate Protein  Shakes (I recommend the  Premier Protein chocolate shake, s EAS AdvantEdge "Carb Control" shakes  Or the low carb shakes by Atkins.    2.5 carbs)   Arnold's "Sandwhich Thin"toasted  w/ peanut butter (no jelly: about 20 net carbs  "Bagel Thin" with cream cheese and salmon: about 20 carbs   a scrambled egg/bacon/cheese burrito made with Mission's "carb balance" whole wheat tortilla  (about 10 net carbs )  Medical laboratory scientific officerJimmy Deans sells microwaveable frittata (basically a quiche without the pastry crust) that is eaten cold and very convenient way to get your eggs  If you make your own shakes, avoid bananas and pineapple,  And use low carb greek yogurt or almond milk    Avoid cereal and bananas, oatmeal and cream of wheat and grits. They are loaded with carbohydrates!   10 AM: high protein snack:  Protein bar by Atkins (the snack size, under 200 cal, usually < 6 net carbs).    A stick of cheese:  Around 1 carb,  100 cal     Dannon Light n Fit [place]Greek Yogurt  (80 cal, 8 carbs)  Other so called "protein bars" and Greek yogurts tend to be loaded with carbohydrates.  Remember, in food advertising, the word "energy" is synonymous for " carbohydrate."  Lunch:   A Sandwich using the bread choices listed, Can use any  Eggs,  lunchmeat, grilled meat or canned tuna), avocado, regular mayo/mustard  and cheese.  A Salad using blue cheese, ranch,  Goddess or vinagrette,  Avoid taco shells, croutons or "confetti" and no "candied nuts" but regular nuts OK.   No  pretzels, nabs  or chips.  Pickles and miniature sweet peppers are a good low carb alternative that provide a "crunch"  The bread is the only source of carbohydrate in a sandwich and  can be decreased by trying some of these alternatives to traditional loaf bread  Joseph's makes a pita bread and a flat bread that are 50 cal and 4 net carbs available at BJs and WalMart.  This can be toasted to use with hummous as well  Toufayan makes a low carb flatbread that's 100 cal and 9 net carbs available at [employer] and [employer]  Mission makes 2 sizes of  Low carb whole wheat tortilla  (The large one is 210 cal and 6 net carbs)  Ezekiel bread is a loaf bread sold in the frozen section of higher end grocery chains,  Very low carb  Avoid "Low fat dressings, as well as Reyne DumasCatalina and [address] dressings They are loaded with sugar!   3 PM/ Mid day  Snack:  Consider  1 ounce of  almonds, walnuts, pistachios, pecans, peanuts,  Macadamia nuts or a nut medley.  Avoid "granola"; the dried cranberries and raisins are loaded with carbohydrates. Mixed nuts as long as there are no raisins,  cranberries or dried fruit.    Try the prosciutto/mozzarella cheese sticks by  Fiorruci  In Clinical research associate /backery section   High protein      6 PM  Dinner:     Meat/fowl/fish with a green salad, and either broccoli, cauliflower, green beans, spinach, brussel sprouts or  Lima beans. DO NOT BREAD THE PROTEIN!!      There is a low carb pasta by Dreamfield's that is acceptable and tastes great: only 5 digestible carbs/serving.( All grocery stores but BJs carry it )  Try Kai Levins Angelo's chicken piccata or chicken or eggplant parm over low carb pasta.(Lowes and BJs)   Clifton Custard Sanchez's "Carnitas" (pulled pork, no sauce,  0 carbs) or his beef pot roast to make a dinner burrito (at BJ's)  Pesto over low carb pasta (bj's sells a good quality pesto in the center refrigerated section of the deli   Try satueeing  Roosvelt Harps with mushroooms  Whole wheat  pasta is still full of digestible carbs and  Not as low in glycemic index as Dreamfield's.   Brown rice is still rice,  So skip the rice and noodles if you eat [place] or [place] (or at least limit to 1/2 cup)  9 PM snack :   Breyer's "low carb" fudgsicle or  ice cream bar (Carb Smart line), or  Weight Watcher's ice cream bar , or another "no sugar added" ice cream;  a serving of fresh berries/cherries with whipped cream   Cheese or DANNON'S LlGHT N FIT GREEK YOGURT  8 ounces of [place] unsweetened almond/cococunut milk    Treat yourself to a parfait made with whipped cream blueberiies, walnuts and vanilla greek yogurt  Avoid bananas, pineapple, grapes  and watermelon on a regular basis because they are high in sugar.  THINK OF THEM AS DESSERT  Remember that snack Substitutions should be less than 10 NET carbs per serving and meals < 20 carbs. Remember to subtract fiber grams to get the "net carbs."

## 2015-04-14 NOTE — Assessment & Plan Note (Addendum)
Recent episode occurred after a brief period of exertion (walking to mailbox) .  She has normal EKG today but given her multiple CRFS uncluding untreated PAD,, prediabetes and obesity, adbised to continue taking baby aspirin daily.  Referral to Cardiology

## 2015-04-17 ENCOUNTER — Encounter: Payer: Self-pay | Admitting: Internal Medicine

## 2015-04-17 DIAGNOSIS — R200 Anesthesia of skin: Secondary | ICD-10-CM | POA: Insufficient documentation

## 2015-04-17 HISTORY — DX: Anesthesia of skin: R20.0

## 2015-04-17 NOTE — Assessment & Plan Note (Signed)
She refuses to take a statin despite a history of diet controlled diabetes and recent carotid artery screenings which suggest nonocclusive PAD.   Lab Results  Component Value Date   CHOL 177 04/12/2015   HDL 45.50 04/12/2015   LDLCALC 96 04/12/2015   LDLDIRECT 113.0 10/12/2014   TRIG 179.0* 04/12/2015   CHOLHDL 4 04/12/2015   Lab Results  Component Value Date   ALT 24 04/12/2015   AST 21 04/12/2015   ALKPHOS 65 04/12/2015   BILITOT 0.9 04/12/2015

## 2015-04-17 NOTE — Assessment & Plan Note (Signed)
B12 deficiency has been addressed with no change in symptoms .Marland Kitchen. Heavy metals screen was normal (2012)

## 2015-04-17 NOTE — Assessment & Plan Note (Signed)
Presumed by ultrasound changes and serologies negative for autoimmune causes of hepatitis.  Current liver enzymes are normal and all modifiable risk factors were addressed with patient today including obesity, metabolic syndrome and hyperlipidemia .  Hep A/B vaccine series given.   Lab Results  Component Value Date   ALT 24 04/12/2015   AST 21 04/12/2015   ALKPHOS 65 04/12/2015   BILITOT 0.9 04/12/2015

## 2015-04-17 NOTE — Assessment & Plan Note (Addendum)
Diagnosed by sleep study. She is wearing her CPAP every night a minimum of 6 hours per night and notes improved daytime wakefulness and decreased fatigue .  Advised to avoid new apparatus recommended by dentist, continue use of CPAP and increase efforts to lose weight

## 2015-04-17 NOTE — Assessment & Plan Note (Signed)
Unclear if this is progression of peripheral neuropathy or due to cervical Disk disease.  Given positional nature,  Prefer the latter.  Plain films of spine ordered.

## 2015-04-17 NOTE — Assessment & Plan Note (Signed)
I have addressed  BMI and recommended a low glycemic index diet utilizing smaller more frequent meals to increase metabolism.  I have also recommended that patient find some form of exercise that she can participate in regularly for  a goal of 30 minutes of aerobic exercise a minimum of 5 days per week.

## 2015-04-17 NOTE — Assessment & Plan Note (Signed)
Fasting glucoses have been well under 125 hemoglobin A1c was 6.3  Reminder for annual eye exam given..  F. Meds reviewed and she is now taking a baby aspirin  . Not on Statin or an ACE inhibitor because she refuses to take medications..Marland Kitchen

## 2015-04-20 ENCOUNTER — Telehealth: Payer: Self-pay | Admitting: Internal Medicine

## 2015-04-20 ENCOUNTER — Ambulatory Visit
Admission: RE | Admit: 2015-04-20 | Discharge: 2015-04-20 | Disposition: A | Discharge status: Home / Self Care | Payer: Medicare Other | Source: Ambulatory Visit | Attending: Internal Medicine | Admitting: Internal Medicine

## 2015-04-20 DIAGNOSIS — R208 Other disturbances of skin sensation: Secondary | ICD-10-CM | POA: Diagnosis present

## 2015-04-20 DIAGNOSIS — M50321 Other cervical disc degeneration at C4-C5 level: Secondary | ICD-10-CM | POA: Insufficient documentation

## 2015-04-20 DIAGNOSIS — R200 Anesthesia of skin: Secondary | ICD-10-CM

## 2015-04-20 NOTE — Telephone Encounter (Signed)
Notes Recorded by Sherlene Shamseresa L Tullo, MD on 04/20/2015 at 2:05 PM Your plain films of the neck suggested that degenerative changes at multiple levels have caused bone spurs that may be pushing on nerve roots as they come out from the spinal cord. This can cause pain that radiates to either arm, or to the scalp and cause headaches. An MRI would provide more detailed information . Please let me know if you wold like to proceed.  Regards,  Dr. Darrick Huntsmanullo

## 2015-04-20 NOTE — Telephone Encounter (Signed)
Left message for patient to return my call.

## 2015-04-21 ENCOUNTER — Telehealth: Payer: Self-pay | Admitting: Internal Medicine

## 2015-04-21 NOTE — Telephone Encounter (Signed)
Pt just received neck x-ray results. She is wondering if the problem in her neck may had caused the faint feeling she had a couple of weeks ago. If so, she wants to cancel the appt.with the heart doctor.

## 2015-04-21 NOTE — Telephone Encounter (Signed)
Returned a call to the patient and she verbalized understanding and will go to the heart doctor. Thanks

## 2015-04-21 NOTE — Telephone Encounter (Signed)
Spoke with the patient and reviewed her Xray results.  She verbalized understanding and would NOT like to proceed at this time with additional tests.  thanks

## 2015-04-21 NOTE — Telephone Encounter (Signed)
Reviewed xray results with her early today, now wants to know if that could have caused her fainting a few weeks ago, if so wants to cancel the appt with cardiology.  Please advise?

## 2015-04-21 NOTE — Telephone Encounter (Signed)
No, it could not have caused the faint feeling.  She needs to see the heart doctor.

## 2015-05-20 NOTE — Progress Notes (Signed)
Cardiology Office Note   Date:  05/21/2015   ID:  [name], DOB October 20, 1945, MRN [id number]  PCP:  Sherlene Shams, MD  Cardiologist:   Rollene Rotunda, MD   Chief Complaint  Patient presents with  . New Evaluation    NO COMPLAINTS.      History of Present Illness: [name] is a 70 y.o. female who presents forEvaluation of chest discomfort. She had one episode walking to get some mail. She felt lightheaded and presyncopal. She was able to get back to where she came from. She did develop some chest discomfort in the left upper chest and a little bit in her face. Is mild and lasted for a few hours. She's had some tingling in her hands but understands this to be related to cervical disc disease. She did check her blood pressure after sh got back from getting the mail and it was normal.  This happened in March and since that time she's had no further symptoms. She is limited by problems with her feet. However, she can do her chores of activity daily living without limitations. She does not typically describe shortness of breath, PND or orthopnea. She doesn't have palpitations, presyncope or syncope. She has had a slow weight gain over the years. She does have a stress with her children.  Past Medical History  Diagnosis Date  . Cellulitis     LEFT LEG  . Psoriasis   . Edema leg     LEFT LEG...CHRONIC  . Hepatic cyst     STABLE PER 05/20/2008 ULTRASOUND  . Fatty liver   . Endometrial hyperplasia   . Hypertension     borderline...controlled since taking herself of HCTZ IN [place]  . Diabetes mellitus     CONTROLLED ON DIET ALONE  . Sleep apnea     CPAP  . Glaucoma   . Cataract   . IBS (irritable bowel syndrome)   . External hemorrhoid     Past Surgical History  Procedure Laterality Date  . Hysteroscopy w/d&c  2007    BC OF ABNORAL PAP AND ENDOMETRIAL HYPERPLASIA  . Bone density test  2006  . One vaginal delivery    . Vaginal hysterectomy  2010  . Umbilical hernia repair   2010     Current Outpatient Prescriptions  Medication Sig Dispense Refill  . ALPRAZolam (XANAX) 0.25 MG tablet Take 1 tablet (0.25 mg total) by mouth 2 (two) times daily as needed for anxiety. 20 tablet 0  . aspirin EC 81 MG tablet Take 81 mg by mouth daily.    Marland Kitchen b complex vitamins tablet Take 1 tablet by mouth daily.    . beclomethasone (QVAR) 80 MCG/ACT inhaler Inhale 1 puff into the lungs daily as needed.      . bimatoprost (LUMIGAN) 0.03 % ophthalmic solution Place 1 drop into both eyes at bedtime.    . cetirizine (ZYRTEC) 10 MG tablet Take 10 mg by mouth daily.      . Cholecalciferol (VITAMIN D3) 2000 units TABS Take 1 tablet by mouth daily.    . clobetasol (OLUX) 0.05 % topical foam Apply topically as needed.      . cyanocobalamin 1000 MCG tablet Take 1,000 mcg by mouth daily.    Marland Kitchen desonide (DESOWEN) 0.05 % ointment Apply topically as needed.      . Ginkgo Biloba 120 MG CAPS Take 1 capsule by mouth daily.    . Multiple Vitamins-Minerals (CENTRUM SILVER PO) Take by mouth daily.      Marland Kitchen  Probiotic Product (PROBIOTIC COMPLEX ACIDOPHILUS PO) Take 1 capsule by mouth daily.    Marland Kitchen RESVERATROL PO Take 100 mcg by mouth daily.    . sodium chloride (OCEAN) 0.65 % SOLN nasal spray Place 1 spray into the nose as needed.      . TURMERIC CURCUMIN PO Take 1 capsule by mouth 2 (two) times daily.      No current facility-administered medications for this visit.    Allergies:   Sulfa drugs cross reactors    Social History:  The patient  reports that she quit smoking about 37 years ago. Her smoking use included Cigarettes. She has never used smokeless tobacco. She reports that she does not drink alcohol or use illicit drugs.   Family History:  The patient's family history includes Heart disease (age of onset: 15) in her father; Mental illness in her mother. There is no history of Cancer.    ROS:  Please see the history of present illness.   Otherwise, review of systems are positive for none.   All  other systems are reviewed and negative.    PHYSICAL EXAM: VS:  BP 138/84 mmHg  Pulse 81  Ht 5\' 6"  (1.676 m)  Wt 223 lb (101.152 kg)  BMI 36.01 kg/m2 , BMI Body mass index is 36.01 kg/(m^2). GENERAL:  Well appearing HEENT:  Pupils equal round and reactive, fundi not visualized, oral mucosa unremarkable NECK:  No jugular venous distention, waveform within normal limits, carotid upstroke brisk and symmetric, no bruits, no thyromegaly LYMPHATICS:  No cervical, inguinal adenopathy LUNGS:  Clear to auscultation bilaterally BACK:  No CVA tenderness CHEST:  Unremarkable HEART:  PMI not displaced or sustained,S1 and S2 within normal limits, no S3, no S4, no clicks, no rubs, 2 out of 6 apical systolic murmur radiating slightly out the aortic outflow tract but early peaking in brief, no diastolic murmurs ABD:  Flat, positive bowel sounds normal in frequency in pitch, no bruits, no rebound, no guarding, no midline pulsatile mass, no hepatomegaly, no splenomegaly EXT:  2 plus pulses throughout, no edema, no cyanosis no clubbing SKIN:  No rashes no nodules NEURO:  Cranial nerves II through XII grossly intact, motor grossly intact throughout PSYCH:  Cognitively intact, oriented to person place and time    EKG:  EKG is not ordered today. The ekg ordered 04/14/15 demonstrates sinus rhythm, rate 80, leftward axis, poor anterior R wave progression, no acute ST-T wave changes.   Recent Labs: 10/12/2014: TSH 3.83 04/12/2015: ALT 24; BUN 16; Creatinine, Ser 0.78; Potassium 4.5; Sodium 140    Lipid Panel    Component Value Date/Time   CHOL 177 04/12/2015 1009   TRIG 179.0* 04/12/2015 1009   HDL 45.50 04/12/2015 1009   CHOLHDL 4 04/12/2015 1009   VLDL 35.8 04/12/2015 1009   LDLCALC 96 04/12/2015 1009   LDLDIRECT 113.0 10/12/2014 1350      Wt Readings from Last 3 Encounters:  05/21/15 223 lb (101.152 kg)  04/14/15 222 lb 8 oz (100.925 kg)  10/15/14 218 lb 4 oz (98.998 kg)      Other  studies Reviewed: Additional studies/ records that were reviewed today include: Office records and EKG. Review of the above records demonstrates:  Please see elsewhere in the note.     ASSESSMENT AND PLAN:  CHEST PAIN:  This is atypical but she does have some risk factors. I will bring the patient back for a POET (Plain Old Exercise Test). This will allow me to screen for obstructive  coronary disease, risk stratify and very importantly provide a prescription for exercise.  OVERWEIGHT:  The patient understands the need to lose weight with diet and exercise. We have discussed specific strategies for this.   Current medicines are reviewed at length with the patient today.  The patient does not have concerns regarding medicines.  The following changes have been made:  no change  Labs/ tests ordered today include:   Orders Placed This Encounter  Procedures  . Exercise Tolerance Test     Disposition:   FU with me as needed.     Signed, Rollene RotundaJames Zelia Yzaguirre, MD  05/21/2015 10:42 AM    [facility]

## 2015-05-21 ENCOUNTER — Encounter: Payer: Self-pay | Admitting: Cardiology

## 2015-05-21 ENCOUNTER — Ambulatory Visit (INDEPENDENT_AMBULATORY_CARE_PROVIDER_SITE_OTHER): Payer: Medicare Other | Admitting: Cardiology

## 2015-05-21 VITALS — BP 138/84 | HR 81 | Ht 66.0 in | Wt 223.0 lb

## 2015-05-21 DIAGNOSIS — R079 Chest pain, unspecified: Secondary | ICD-10-CM | POA: Diagnosis not present

## 2015-05-21 NOTE — Patient Instructions (Signed)
Medication Instructions:  Continue current medication  Labwork: NONE  Testing/Procedures: Your physician has requested that you have an exercise tolerance test. For further information please visit [email or web address]. Please also follow instruction sheet, as given.   Follow-Up: As Needed  Any Other Special Instructions Will Be Listed Below (If Applicable).   If you need a refill on your cardiac medications before your next appointment, please call your pharmacy.

## 2015-06-15 ENCOUNTER — Inpatient Hospital Stay (HOSPITAL_COMMUNITY): Admission: RE | Admit: 2015-06-15 | Payer: Medicare Other | Source: Ambulatory Visit

## 2015-06-28 ENCOUNTER — Ambulatory Visit: Payer: Medicare Other | Admitting: Cardiology

## 2015-07-15 ENCOUNTER — Inpatient Hospital Stay (HOSPITAL_COMMUNITY): Admission: RE | Admit: 2015-07-15 | Payer: Medicare Other | Source: Ambulatory Visit

## 2015-07-30 ENCOUNTER — Telehealth: Payer: Self-pay | Admitting: Cardiology

## 2015-07-30 DIAGNOSIS — R079 Chest pain, unspecified: Secondary | ICD-10-CM

## 2015-07-30 NOTE — Telephone Encounter (Signed)
New message     The pt is having a problem with her left foot, and the pt has appointment to see the foot Md on August 2 nd , and is unable to perform the Tredmill test, due to the foot problem. The pt needs to speak with a nurse.  The pt doe not want to re-schedule the Treadmill  Unless to do the intravenously .

## 2015-07-30 NOTE — Telephone Encounter (Signed)
Patient states she cancelled ETT d/t issue with her foot - she cannot walk.  She would like to see if Dr. Antoine PocheHochrein can order lexiscan myoview instead  Patient is aware he is out of the office so it will be next week before she will get notified of his advice  Message routed to MD

## 2015-07-31 NOTE — Telephone Encounter (Signed)
OK to order [employer] Myoview.  Patient unable to walk on treadmill.

## 2015-08-02 NOTE — Telephone Encounter (Signed)
Lexiscan Myoview ordered and send to scheduler to be schedule 

## 2015-08-03 ENCOUNTER — Telehealth: Payer: Self-pay | Admitting: Cardiology

## 2015-08-03 NOTE — Telephone Encounter (Signed)
Called patient and set up lexiscan myoview for 08-11-15.  Instructions and calendar mailed to the patient today.

## 2015-08-06 ENCOUNTER — Telehealth (HOSPITAL_COMMUNITY): Payer: Self-pay

## 2015-08-06 NOTE — Telephone Encounter (Signed)
Encounter complete. 

## 2015-08-11 ENCOUNTER — Ambulatory Visit (HOSPITAL_COMMUNITY)
Admission: RE | Admit: 2015-08-11 | Discharge: 2015-08-11 | Disposition: A | Discharge status: Home / Self Care | Payer: Medicare Other | Source: Ambulatory Visit | Attending: Cardiovascular Disease | Admitting: Cardiovascular Disease

## 2015-08-11 DIAGNOSIS — E119 Type 2 diabetes mellitus without complications: Secondary | ICD-10-CM | POA: Insufficient documentation

## 2015-08-11 DIAGNOSIS — E669 Obesity, unspecified: Secondary | ICD-10-CM | POA: Diagnosis not present

## 2015-08-11 DIAGNOSIS — Z6836 Body mass index (BMI) 36.0-36.9, adult: Secondary | ICD-10-CM | POA: Diagnosis not present

## 2015-08-11 DIAGNOSIS — I779 Disorder of arteries and arterioles, unspecified: Secondary | ICD-10-CM | POA: Diagnosis not present

## 2015-08-11 DIAGNOSIS — Z8249 Family history of ischemic heart disease and other diseases of the circulatory system: Secondary | ICD-10-CM | POA: Insufficient documentation

## 2015-08-11 DIAGNOSIS — R42 Dizziness and giddiness: Secondary | ICD-10-CM | POA: Diagnosis not present

## 2015-08-11 DIAGNOSIS — R0609 Other forms of dyspnea: Secondary | ICD-10-CM | POA: Diagnosis not present

## 2015-08-11 DIAGNOSIS — Z87891 Personal history of nicotine dependence: Secondary | ICD-10-CM | POA: Insufficient documentation

## 2015-08-11 DIAGNOSIS — R55 Syncope and collapse: Secondary | ICD-10-CM | POA: Diagnosis not present

## 2015-08-11 DIAGNOSIS — R079 Chest pain, unspecified: Secondary | ICD-10-CM | POA: Diagnosis present

## 2015-08-11 DIAGNOSIS — I10 Essential (primary) hypertension: Secondary | ICD-10-CM | POA: Insufficient documentation

## 2015-08-11 LAB — MYOCARDIAL PERFUSION IMAGING
LV dias vol: 90 mL (ref 46–106)
LV sys vol: 34 mL
Peak HR: 114 {beats}/min
RATE: 1.27
Rest HR: 84 {beats}/min
SDS: 5
SRS: 1
SSS: 6
TID: 1.27

## 2015-08-11 MED ORDER — TECHNETIUM TC 99M TETROFOSMIN IV KIT
32.0000 | PACK | Freq: Once | INTRAVENOUS | Status: AC | PRN
  Administered 2015-08-11: 32 via INTRAVENOUS
  Filled 2015-08-11: qty 32

## 2015-08-11 MED ORDER — TECHNETIUM TC 99M TETROFOSMIN IV KIT
10.9000 | PACK | Freq: Once | INTRAVENOUS | Status: AC | PRN
  Administered 2015-08-11: 10.9 via INTRAVENOUS
  Filled 2015-08-11: qty 11

## 2015-08-11 MED ORDER — REGADENOSON 0.4 MG/5ML IV SOLN
0.4000 mg | Freq: Once | INTRAVENOUS | Status: AC
  Administered 2015-08-11: 0.4 mg via INTRAVENOUS

## 2015-08-18 ENCOUNTER — Ambulatory Visit (INDEPENDENT_AMBULATORY_CARE_PROVIDER_SITE_OTHER): Payer: Medicare Other | Admitting: Podiatry

## 2015-08-18 ENCOUNTER — Encounter: Payer: Self-pay | Admitting: Podiatry

## 2015-08-18 DIAGNOSIS — M779 Enthesopathy, unspecified: Secondary | ICD-10-CM | POA: Diagnosis not present

## 2015-08-18 DIAGNOSIS — Q828 Other specified congenital malformations of skin: Secondary | ICD-10-CM | POA: Diagnosis not present

## 2015-08-18 NOTE — Progress Notes (Signed)
She presents today with chief complaint of pain to the fifth digit of left foot. She states that she is a prediabetic and is concerned about the wound on medial aspect of the fifth digit left foot. She states that she has tried salicylic pads to no avail and this continues to annoy her.  Objective: Vital signs are stable she is alert and oriented 3 pulses remain strong palpable left foot. Neurologic sensorium is intact. Cutaneous evaluation demonstrates supple well-hydrated cutis that she does have pain on palpation of the fifth PIPJ left foot. Beneath which there is a small area of reactive hyperkeratosis between the fourth and fifth toes. It directly overlies the PIPJ.  Assessment: Capsulitis PIPJ with reactive hyperkeratosis.  Plan: I injected the area today with 1 mg of dexamethasone and local anesthetic and debrided the reactive hyperkeratosis. Placed padding instructed her on how to do so and I will follow-up with her as needed.

## 2015-09-17 ENCOUNTER — Other Ambulatory Visit: Payer: Self-pay

## 2015-10-18 ENCOUNTER — Other Ambulatory Visit: Payer: Self-pay | Admitting: Internal Medicine

## 2015-10-18 DIAGNOSIS — Z1231 Encounter for screening mammogram for malignant neoplasm of breast: Secondary | ICD-10-CM

## 2015-10-21 ENCOUNTER — Ambulatory Visit (INDEPENDENT_AMBULATORY_CARE_PROVIDER_SITE_OTHER): Payer: Medicare Other

## 2015-10-21 VITALS — BP 138/86 | HR 74 | Temp 97.7°F | Resp 14 | Ht 66.0 in | Wt 224.8 lb

## 2015-10-21 DIAGNOSIS — Z23 Encounter for immunization: Secondary | ICD-10-CM

## 2015-10-21 DIAGNOSIS — Z0000 Encounter for general adult medical examination without abnormal findings: Secondary | ICD-10-CM

## 2015-10-21 NOTE — Patient Instructions (Addendum)
[name] , Thank you for taking time to come for your Medicare Wellness Visit. I appreciate your ongoing commitment to your health goals. Please review the following plan we discussed and let me know if I can assist you in the future.   FOLLOW UP WITH PCP AS NEEDED.  These are the goals we discussed: Goals    . Increase physical activity          Lose 60lbs in 1 year (20lbs every 4 months, 5lbs per month) Increase exercise regimen with chair exercises Purchase a stationary bike       This is a list of the screening recommended for you and due dates:  Health Maintenance  Topic Date Due  .  Hepatitis C: One time screening is recommended by Center for Disease Control  (CDC) for  adults born from [birth year] through 1966.   1945-02-04  . Complete foot exam   10/21/2013  . Colon Cancer Screening  06/16/2014  . Hemoglobin A1C  10/13/2015  . Urine Protein Check  04/11/2016  . Eye exam for diabetics  06/16/2016  . Mammogram  12/08/2016  . Tetanus Vaccine  10/21/2020  . Flu Shot  Completed  . DEXA scan (bone density measurement)  Completed  . Shingles Vaccine  Completed  . Pneumonia vaccines  Completed    Colonoscopy A colonoscopy is an exam to look at the entire large intestine (colon). This exam can help find problems such as tumors, polyps, inflammation, and areas of bleeding. The exam takes about 1 hour.  LET [facility] CARE PROVIDER KNOW ABOUT:   Any allergies you have.  All medicines you are taking, including vitamins, herbs, eye drops, creams, and over-the-counter medicines.  Previous problems you or members of your family have had with the use of anesthetics.  Any blood disorders you have.  Previous surgeries you have had.  Medical conditions you have. RISKS AND COMPLICATIONS  Generally, this is a safe procedure. However, as with any procedure, complications can occur. Possible complications include:  Bleeding.  Tearing or rupture of the colon wall.  Reaction to  medicines given during the exam.  Infection (rare). BEFORE THE PROCEDURE   Ask your health care provider about changing or stopping your regular medicines.  You may be prescribed an oral bowel prep. This involves drinking a large amount of medicated liquid, starting the day before your procedure. The liquid will cause you to have multiple loose stools until your stool is almost clear or light green. This cleans out your colon in preparation for the procedure.  Do not eat or drink anything else once you have started the bowel prep, unless your health care provider tells you it is safe to do so.  Arrange for someone to drive you home after the procedure. PROCEDURE   You will be given medicine to help you relax (sedative).  You will lie on your side with your knees bent.  A long, flexible tube with a light and camera on the end (colonoscope) will be inserted through the rectum and into the colon. The camera sends video back to a computer screen as it moves through the colon. The colonoscope also releases carbon dioxide gas to inflate the colon. This helps your health care provider see the area better.  During the exam, your health care provider may take a small tissue sample (biopsy) to be examined under a microscope if any abnormalities are found.  The exam is finished when the entire colon has been viewed. AFTER  THE PROCEDURE   Do not drive for 24 hours after the exam.  You may have a small amount of blood in your stool.  You may pass moderate amounts of gas and have mild abdominal cramping or bloating. This is caused by the gas used to inflate your colon during the exam.  Ask when your test results will be ready and how you will get your results. Make sure you get your test results.   This information is not intended to replace advice given to you by your health care provider. Make sure you discuss any questions you have with your health care provider.   Document Released:  12/31/1999 Document Revised: 10/23/2012 Document Reviewed: 09/09/2012 Elsevier Interactive Patient Education [employer]2016 [employer] Inc.

## 2015-10-21 NOTE — Progress Notes (Signed)
Subjective:   Maria KoyanagiJanice Fletcher is a 70 y.o. female who presents for Medicare Annual (Subsequent) preventive examination.  Review of Systems:  No ROS.  Medicare Wellness Visit.  Cardiac Risk Factors include: advanced age (>1855men, 97>65 women);hypertension;obesity (BMI >30kg/m2)     Objective:     Vitals: BP 138/86 (BP Location: Left Arm, Patient Position: Sitting, Cuff Size: Normal)   Pulse 74   Temp 97.7 F (36.5 C) (Oral)   Resp 14   Ht 5\' 6"  (1.676 m)   Wt 224 lb 12.8 oz (102 kg)   SpO2 98%   BMI 36.28 kg/m   Body mass index is 36.28 kg/m.   Tobacco History  Smoking Status  . Former Smoker  . Types: Cigarettes  . Quit date: 01/16/1978  Smokeless Tobacco  . Never Used    Comment: REMOTELY QUIT IN 1987 AFTER A FEW YEARS OF USE     Counseling given: Not Answered   Past Medical History:  Diagnosis Date  . Cataract   . Cellulitis    LEFT LEG  . Diabetes mellitus    CONTROLLED ON DIET ALONE  . Edema leg    LEFT LEG...CHRONIC  . Endometrial hyperplasia   . External hemorrhoid   . Fatty liver   . Glaucoma   . Hepatic cyst    STABLE PER 05/20/2008 ULTRASOUND  . Hypertension    borderline...controlled since [place]  . IBS (irritable bowel syndrome)   . Psoriasis   . Sleep apnea    CPAP   Past Surgical History:  Procedure Laterality Date  . BONE DENSITY TEST  2006  . HYSTEROSCOPY W/D&C  2007   BC OF ABNORAL PAP AND ENDOMETRIAL HYPERPLASIA  . ONE VAGINAL DELIVERY    . UMBILICAL HERNIA REPAIR  2010  . VAGINAL HYSTERECTOMY  2010   Family History  Problem Relation Age of Onset  . Mental illness Mother     DEMENTIA  . Heart disease Father [id number]    MI.Marland Kitchen.Marland Kitchen.S/D AVR/CABG x3  . Cancer Neg Hx     no ovarian colon breast   History  Sexual Activity  . Sexual activity: Not on file    Outpatient Encounter Prescriptions as of 10/21/2015  Medication Sig  . ALPRAZolam (XANAX) 0.25 MG tablet Take 1 tablet (0.25 mg total) by mouth 2 (two) times  daily as needed for anxiety.  Marland Kitchen. aspirin EC 81 MG tablet Take 81 mg by mouth daily.  Marland Kitchen. b complex vitamins tablet Take 1 tablet by mouth daily.  . beclomethasone (QVAR) 80 MCG/ACT inhaler Inhale 1 puff into the lungs daily as needed.    . bimatoprost (LUMIGAN) 0.03 % ophthalmic solution Place 1 drop into both eyes at bedtime.  . cetirizine (ZYRTEC) 10 MG tablet Take 10 mg by mouth daily.    . Cholecalciferol (VITAMIN D3) 2000 units TABS Take 1 tablet by mouth daily.  . clobetasol (OLUX) 0.05 % topical foam Apply topically as needed.    . cyanocobalamin 1000 MCG tablet Take 1,000 mcg by mouth daily.  Marland Kitchen. desonide (DESOWEN) 0.05 % ointment Apply topically as needed.    . Ginkgo Biloba 120 MG CAPS Take 1 capsule by mouth daily.  . Multiple Vitamins-Minerals (CENTRUM SILVER PO) Take by mouth daily.    . Probiotic Product (PROBIOTIC COMPLEX ACIDOPHILUS PO) Take 1 capsule by mouth daily.  Marland Kitchen. RESVERATROL PO Take 100 mcg by mouth daily.  . sodium chloride (OCEAN) 0.65 % SOLN nasal spray Place 1 spray  into the nose as needed.    . TURMERIC CURCUMIN PO Take 1 capsule by mouth 2 (two) times daily.    No facility-administered encounter medications on file as of 10/21/2015.     Activities of Daily Living In your present state of health, do you have any difficulty performing the following activities: 10/21/2015  Hearing? N  Vision? N  Difficulty concentrating or making decisions? Y  Walking or climbing stairs? Y  Dressing or bathing? N  Doing errands, shopping? N  Preparing Food and eating ? N  Using the Toilet? N  In the past six months, have you accidently leaked urine? N  Do you have problems with loss of bowel control? Y  Managing your Medications? N  Managing your Finances? N  Housekeeping or managing your Housekeeping? N  Some recent data might be hidden    Patient Care Team: Sherlene Shams, MD as PCP - General (Internal Medicine)    Assessment:    This is a routine wellness examination  for [name]. The goal of the wellness visit is to assist the patient how to close the gaps in care and create a preventative care plan for the patient.   Taking calcium VIT D3 as appropriate/Osteoporosis risk reviewed.  Medications reviewed; taking without issues or barriers.  HTN; controlled with medication.  Stable and followed by PCP.  Safety issues reviewed; smoke and carbon monoxide detectors in the home. Firearms locked in a safe within the home. Wears seatbelts when driving or riding with others. No violence in the home.  No identified risk were noted; The patient was oriented x 3; appropriate in dress and manner and no objective failures at ADL's or IADL's.   Body mass index; discussed the importance of a healthy diet, water intake and exercise. Educational material provided.  High dose influenza vaccine administered, L deltoid.  Tolerated well.  Hepatitis C Screening discussed.  Educational material provided.    Colonoscopy discussed.  Educational information provided regarding COLOguard.  Periph vascular dis NOS- stable and followed by PCP.  Patient Concerns: None at this time. Follow up with PCP as needed.  Exercise Activities and Dietary recommendations Current Exercise Habits: Home exercise routine (Chair exercises), Time (Minutes): 10, Frequency (Times/Week): 2, Weekly Exercise (Minutes/Week): 20, Intensity: Mild  Goals    . Increase physical activity          Lose 60lbs in 1 year (20lbs every 4 months, 5lbs per month) Increase exercise regimen with chair exercises Purchase a stationary bike      Fall Risk Fall Risk  10/21/2015 01/29/2014  Falls in the past year? No No   Depression Screen PHQ 2/9 Scores 10/21/2015 01/29/2014  PHQ - 2 Score 0 0     Cognitive Testing MMSE - Mini Mental State Exam 10/21/2015  Orientation to time 5  Orientation to Place 5  Registration 3  Attention/ Calculation 5  Recall 3  Language- name 2 objects 2  Language-  repeat 1  Language- follow 3 step command 3  Language- read & follow direction 1  Write a sentence 1  Copy design 1  Total score 30    Immunization History  Administered Date(s) Administered  . Hep A / Hep B 10/15/2014, 11/12/2014, 04/14/2015  . Influenza Split 09/29/2011  . Influenza, High Dose Seasonal PF 10/15/2014, 10/21/2015  . Influenza-Unspecified 10/09/2012, 10/26/2013  . Pneumococcal Conjugate-13 01/29/2014  . Pneumococcal Polysaccharide-23 10/21/2012  . Tdap 10/22/2010  . Zoster 10/21/2004   Screening Tests Health Maintenance  Topic Date Due  . Hepatitis C Screening  09/29/1945  . FOOT EXAM  10/21/2013  . COLONOSCOPY  06/16/2014  . HEMOGLOBIN A1C  10/13/2015  . URINE MICROALBUMIN  04/11/2016  . OPHTHALMOLOGY EXAM  06/16/2016  . MAMMOGRAM  12/08/2016  . TETANUS/TDAP  10/21/2020  . INFLUENZA VACCINE  Completed  . DEXA SCAN  Completed  . ZOSTAVAX  Completed  . PNA vac Low Risk Adult  Completed      Plan:   End of life planning; Advance aging; Advanced directives discussed. Copy of current HCPOA/Living Will requested.  Medicare Attestation I have personally reviewed: The patient's medical and social history Their use of alcohol, tobacco or illicit drugs Their current medications and supplements The patient's functional ability including ADLs,fall risks, home safety risks, cognitive, and hearing and visual impairment Diet and physical activities Evidence for depression   The patient's weight, height, BMI, and visual acuity have been recorded in the chart.  I have made referrals and provided education to the patient based on review of the above and I have provided the patient with a written personalized care plan for preventive services.    During the course of the visit the patient was educated and counseled about the following appropriate screening and preventive services:   Vaccines to include Pneumoccal, Influenza, Hepatitis B, Td, Zostavax,  HCV  Electrocardiogram  Cardiovascular Disease  Colorectal cancer screening  Bone density screening  Diabetes screening  Glaucoma screening  Mammography/PAP  Nutrition counseling   Patient Instructions (the written plan) was given to the patient.   Ashok Pall, LPN  16/01/958

## 2015-10-23 NOTE — Progress Notes (Signed)
  I have reviewed the above information and agree with above.   Rosezetta Balderston, MD 

## 2015-11-03 ENCOUNTER — Ambulatory Visit: Payer: Medicare Other

## 2015-12-13 ENCOUNTER — Ambulatory Visit
Admission: RE | Admit: 2015-12-13 | Discharge: 2015-12-13 | Disposition: A | Discharge status: Home / Self Care | Payer: Medicare Other | Source: Ambulatory Visit | Attending: Internal Medicine | Admitting: Internal Medicine

## 2015-12-13 DIAGNOSIS — Z1231 Encounter for screening mammogram for malignant neoplasm of breast: Secondary | ICD-10-CM

## 2016-10-05 ENCOUNTER — Telehealth: Payer: Self-pay | Admitting: Internal Medicine

## 2016-10-05 NOTE — Telephone Encounter (Signed)
Pt decline AWV for this year

## 2016-11-02 ENCOUNTER — Other Ambulatory Visit: Payer: Self-pay | Admitting: Internal Medicine

## 2016-11-02 DIAGNOSIS — Z1231 Encounter for screening mammogram for malignant neoplasm of breast: Secondary | ICD-10-CM

## 2016-12-14 ENCOUNTER — Ambulatory Visit
Admission: RE | Admit: 2016-12-14 | Discharge: 2016-12-14 | Disposition: A | Discharge status: Home / Self Care | Payer: Medicare Other | Source: Ambulatory Visit | Attending: Internal Medicine | Admitting: Internal Medicine

## 2016-12-14 DIAGNOSIS — Z1231 Encounter for screening mammogram for malignant neoplasm of breast: Secondary | ICD-10-CM

## 2017-01-12 IMAGING — CR DG CERVICAL SPINE COMPLETE 4+V
5 series · 5 of 5 positions shown · non-contrast
Comparison: None.

CLINICAL DATA: Worsening neck pain with numbness in the fingers
over the last 3 months

EXAM:
CERVICAL SPINE - COMPLETE 4+ VIEW

[c-spine lat]
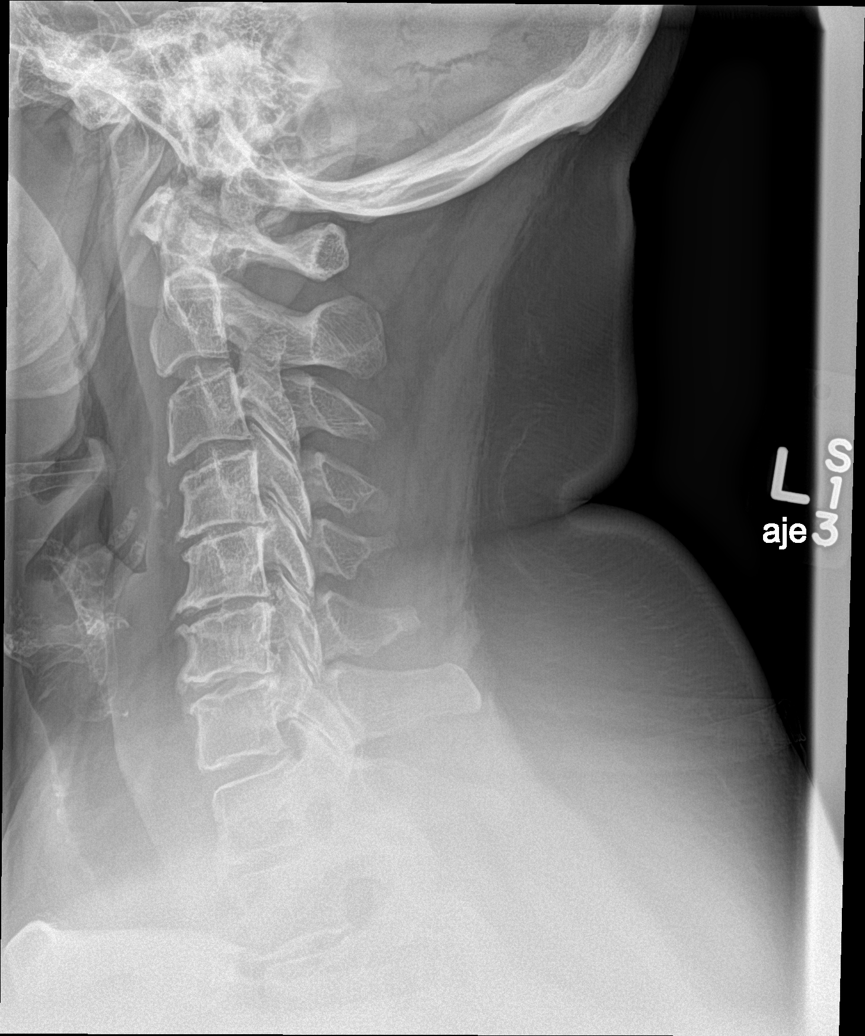

[c-spine obl (1 of 2)]
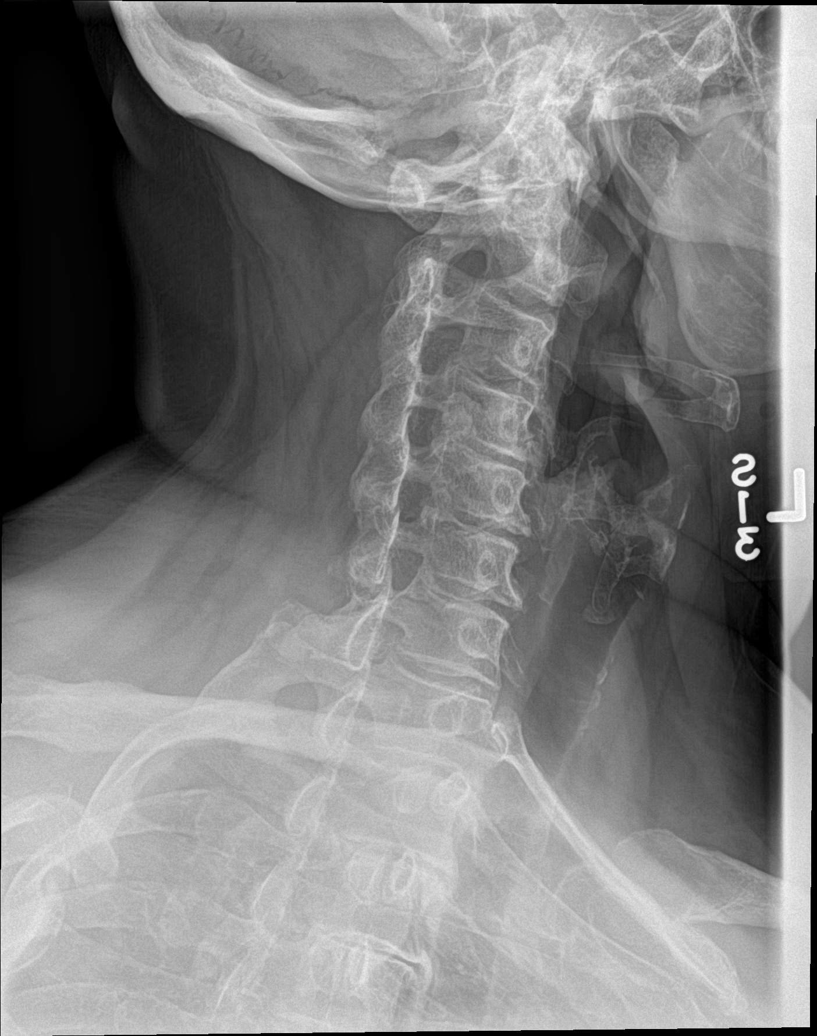

[c-spine obl (2 of 2)]
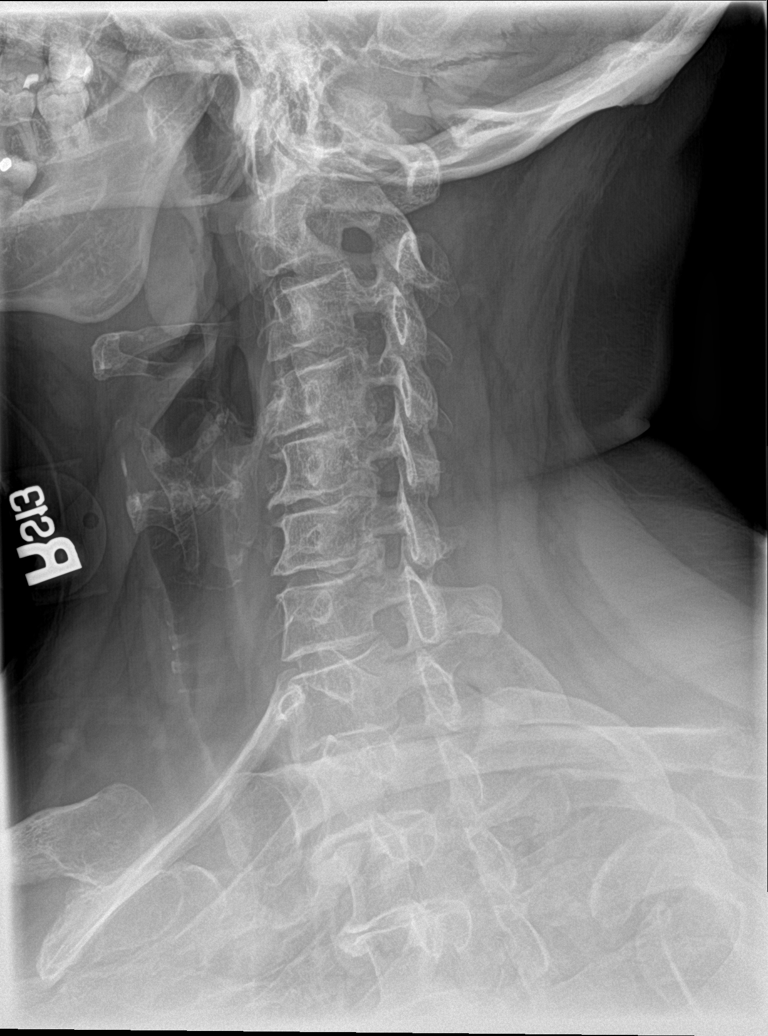

[c-spine ap]
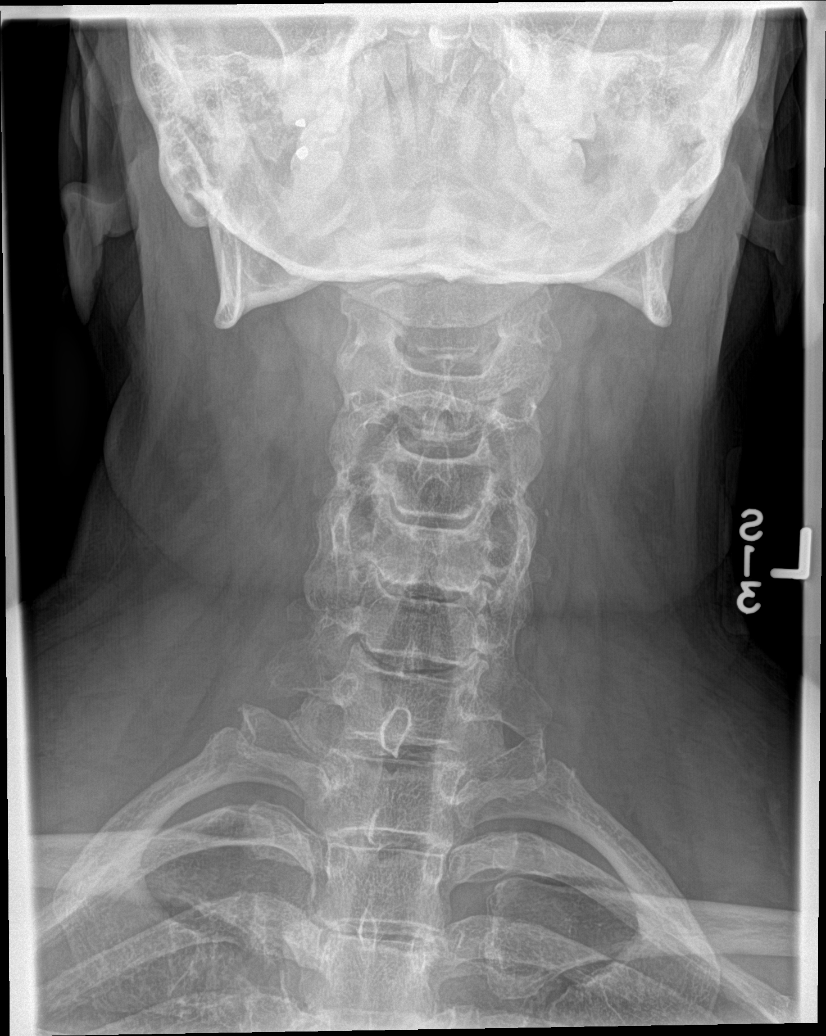

[c-spine open mouth]
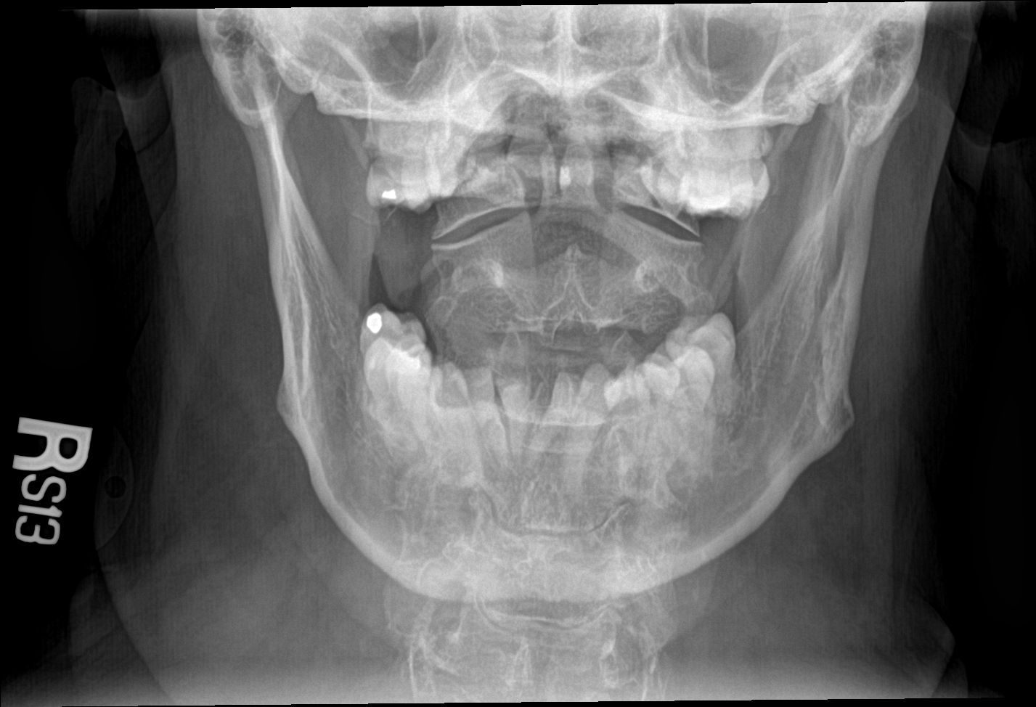

[5 of 5 positions shown; findings below may reference images not displayed]

FINDINGS: There is slight reversal of the normal cervical spine curvature.
There is degenerative disc disease at C4-5, C5-6, and C6-7 levels
with loss of disc space and sclerosis with spurring. No prevertebral
soft tissue swelling is seen. On oblique views, there is moderate
foraminal narrowing at C4-5, C5-6, and C6-7 levels. The odontoid
process is intact. The lung apices are clear.
IMPRESSION: 1. Reversal of normal cervical spine curvature.
2. Degenerative disc disease from C4-C7 with foraminal narrowing at
these levels bilaterally.

## 2017-03-19 ENCOUNTER — Ambulatory Visit (INDEPENDENT_AMBULATORY_CARE_PROVIDER_SITE_OTHER): Payer: Medicare Other | Admitting: Internal Medicine

## 2017-03-19 ENCOUNTER — Encounter: Payer: Self-pay | Admitting: Internal Medicine

## 2017-03-19 VITALS — BP 140/88 | HR 88 | Temp 98.1°F | Resp 15 | Ht 66.0 in | Wt 220.0 lb

## 2017-03-19 DIAGNOSIS — E559 Vitamin D deficiency, unspecified: Secondary | ICD-10-CM | POA: Diagnosis not present

## 2017-03-19 DIAGNOSIS — E1149 Type 2 diabetes mellitus with other diabetic neurological complication: Secondary | ICD-10-CM | POA: Diagnosis not present

## 2017-03-19 DIAGNOSIS — K760 Fatty (change of) liver, not elsewhere classified: Secondary | ICD-10-CM | POA: Diagnosis not present

## 2017-03-19 DIAGNOSIS — R7303 Prediabetes: Secondary | ICD-10-CM | POA: Diagnosis not present

## 2017-03-19 DIAGNOSIS — Z1211 Encounter for screening for malignant neoplasm of colon: Secondary | ICD-10-CM

## 2017-03-19 DIAGNOSIS — L639 Alopecia areata, unspecified: Secondary | ICD-10-CM

## 2017-03-19 DIAGNOSIS — E785 Hyperlipidemia, unspecified: Secondary | ICD-10-CM | POA: Diagnosis not present

## 2017-03-19 DIAGNOSIS — G4733 Obstructive sleep apnea (adult) (pediatric): Secondary | ICD-10-CM | POA: Diagnosis not present

## 2017-03-19 DIAGNOSIS — R5383 Other fatigue: Secondary | ICD-10-CM

## 2017-03-19 DIAGNOSIS — G5790 Unspecified mononeuropathy of unspecified lower limb: Secondary | ICD-10-CM | POA: Diagnosis not present

## 2017-03-19 DIAGNOSIS — I10 Essential (primary) hypertension: Secondary | ICD-10-CM | POA: Diagnosis not present

## 2017-03-19 DIAGNOSIS — Z9989 Dependence on other enabling machines and devices: Secondary | ICD-10-CM

## 2017-03-19 DIAGNOSIS — E538 Deficiency of other specified B group vitamins: Secondary | ICD-10-CM | POA: Diagnosis not present

## 2017-03-19 LAB — VITAMIN B12: Vitamin B-12: 331 pg/mL (ref 211–911)

## 2017-03-19 LAB — LIPID PANEL
Cholesterol: 194 mg/dL (ref 0–200)
HDL: 53.3 mg/dL (ref >39.00)
LDL Cholesterol: 106 mg/dL — ABNORMAL HIGH (ref 0–99)
NonHDL: 140.22
Total CHOL/HDL Ratio: 4
Triglycerides: 172 mg/dL — ABNORMAL HIGH (ref 0.0–149.0)
VLDL: 34.4 mg/dL (ref 0.0–40.0)

## 2017-03-19 LAB — CBC WITH DIFFERENTIAL/PLATELET
Basophils Absolute: 0.1 10*3/uL (ref 0.0–0.1)
Basophils Relative: 0.6 % (ref 0.0–3.0)
Eosinophils Absolute: 0.3 10*3/uL (ref 0.0–0.7)
Eosinophils Relative: 2.4 % (ref 0.0–5.0)
HCT: 41.1 % (ref 36.0–46.0)
Hemoglobin: 14.1 g/dL (ref 12.0–15.0)
Lymphocytes Relative: 36.2 % (ref 12.0–46.0)
Lymphs Abs: 4.4 10*3/uL — ABNORMAL HIGH (ref 0.7–4.0)
MCHC: 34.2 g/dL (ref 30.0–36.0)
MCV: 87.3 fl (ref 78.0–100.0)
Monocytes Absolute: 0.7 10*3/uL (ref 0.1–1.0)
Monocytes Relative: 6.1 % (ref 3.0–12.0)
Neutro Abs: 6.6 10*3/uL (ref 1.4–7.7)
Neutrophils Relative %: 54.7 % (ref 43.0–77.0)
Platelets: 325 10*3/uL (ref 150.0–400.0)
RBC: 4.71 Mil/uL (ref 3.87–5.11)
RDW: 13.8 % (ref 11.5–15.5)
WBC: 12 10*3/uL — ABNORMAL HIGH (ref 4.0–10.5)

## 2017-03-19 LAB — VITAMIN D 25 HYDROXY (VIT D DEFICIENCY, FRACTURES): VITD: 22.91 ng/mL — ABNORMAL LOW (ref 30.00–100.00)

## 2017-03-19 LAB — COMPREHENSIVE METABOLIC PANEL
ALT: 52 U/L — ABNORMAL HIGH (ref 0–35)
AST: 40 U/L — ABNORMAL HIGH (ref 0–37)
Albumin: 4 g/dL (ref 3.5–5.2)
Alkaline Phosphatase: 75 U/L (ref 39–117)
BUN: 11 mg/dL (ref 6–23)
CO2: 29 mEq/L (ref 19–32)
Calcium: 9.4 mg/dL (ref 8.4–10.5)
Chloride: 101 mEq/L (ref 96–112)
Creatinine, Ser: 0.78 mg/dL (ref 0.40–1.20)
GFR: 77.05 mL/min (ref >60.00)
Glucose, Bld: 110 mg/dL — ABNORMAL HIGH (ref 70–99)
Potassium: 4.6 mEq/L (ref 3.5–5.1)
Sodium: 139 mEq/L (ref 135–145)
Total Bilirubin: 1.4 mg/dL — ABNORMAL HIGH (ref 0.2–1.2)
Total Protein: 7.3 g/dL (ref 6.0–8.3)

## 2017-03-19 LAB — MICROALBUMIN / CREATININE URINE RATIO
Creatinine,U: 150.6 mg/dL
Microalb Creat Ratio: 1.1 mg/g (ref 0.0–30.0)
Microalb, Ur: 1.6 mg/dL (ref 0.0–1.9)

## 2017-03-19 LAB — TSH: TSH: 3.56 u[IU]/mL (ref 0.35–4.50)

## 2017-03-19 LAB — T4, FREE: Free T4: 0.58 ng/dL — ABNORMAL LOW (ref 0.60–1.60)

## 2017-03-19 LAB — HEMOGLOBIN A1C: Hgb A1c MFr Bld: 6.5 % (ref 4.6–6.5)

## 2017-03-19 LAB — MAGNESIUM: Magnesium: 2 mg/dL (ref 1.5–2.5)

## 2017-03-19 NOTE — Assessment & Plan Note (Signed)
B12 deficiency has been addressed with no change in symptoms .Marland Kitchen. Heavy metals screen was normal (2012) .

## 2017-03-19 NOTE — Patient Instructions (Addendum)
We will order your colon cancer screening test  cologuard  Daily Exercise:  The [employer],  or walking 20 minutes daily is your goal  Check to see if CPAP is "autotitrating" automatically adjusts to the pressure you need)    The new goals for optimal blood pressure management are 120/70.    Please check your blood pressure a few times at home and  Bring your BP machine back for an RN visit to check pressure and calibrate   Gaol 120/70 to 130/80 because  you have LVH

## 2017-03-19 NOTE — Assessment & Plan Note (Signed)
10 YR RISK  Using FRC is 23%  Will again recommend statin therapy, which she has refused  In the past  despite a history of diet controlled diabetes and recent carotid artery screenings which suggest nonocclusive PAD.   Lab Results  Component Value Date   CHOL 194 03/19/2017   HDL 53.30 03/19/2017   LDLCALC 106 (H) 03/19/2017   LDLDIRECT 113.0 10/12/2014   TRIG 172.0 (H) 03/19/2017   CHOLHDL 4 03/19/2017   Lab Results  Component Value Date   ALT 52 (H) 03/19/2017   AST 40 (H) 03/19/2017   ALKPHOS 75 03/19/2017   BILITOT 1.4 (H) 03/19/2017

## 2017-03-19 NOTE — Assessment & Plan Note (Signed)
Presumed by ultrasound changes and serologies negative for autoimmune causes of hepatitis.  Current liver enzymes are elevated and all modifiable risk factors were addressed with patient today including obesity, metabolic syndrome and hyperlipidemia .  Hep A/B vaccine series has been given.   Lab Results  Component Value Date   ALT 52 (H) 03/19/2017   AST 40 (H) 03/19/2017   ALKPHOS 75 03/19/2017   BILITOT 1.4 (H) 03/19/2017

## 2017-03-19 NOTE — Progress Notes (Signed)
Subjective:  Patient ID: Maria KoyanagiJanice Larcom, female    DOB: 03/05/1945  Age: 72 y.o. MRN: [id number]  CC: The primary encounter diagnosis was Fatty liver. Diagnoses of B12 deficiency, Essential hypertension, Type 2 diabetes mellitus with neurological complications (HCC), Vitamin D deficiency, Fatigue, unspecified type, Prediabetes, Hyperlipidemia LDL goal <130, Screening for colon cancer, Neuropathy of foot, unspecified laterality, OSA on CPAP, and Alopecia areata were also pertinent to this visit.  HPI Maria KoyanagiJanice Weimer presents for follow up on multiple issues.  Last seen 2 years ago March 2017:  Treated for gout in January  by Urgent Care  Mammogram normal Nov 2018  C4-c7 cervical spine foraminal  narrowing  By x rays  April 2017  Colonoscopy overdue,  Last one was May 2006 Eye exam with [facility] last week. .  Cataracts early ,   Glaucoma diagnosed years ago. No retinopathy Prediabetes a1c 6.19 April 2015   1) hair loss:  Crown,  Right parietal area,  Not noticed by hairdresser.   2) neuropathy in feet getting worse : both feet feel numb. .  3) foot pain   [facility]   4))  Fatigue since  Christmas 2017   Has OSA .  averages 5-6 hours using CPAP. mask in place  in the morning.  ;last study was 2007. Has a  Resmed CPAP .  72 yrs old   from [place]  Diet is omnivore.  Breakfast is a teaspoon of peanut butter.  Dinner is usually done by 7 pm.  Is not exercising  Due to neuropathy,  Bilateral foot pain .  Right leg feels heavy and she has pain in the lateral right knee  biceps femoris tendon area ,afraid of falling.  Afraid of swimming averages 5-6 hours per night bc she  stays up late watching perry mason at midnight    4) elevated blood pressure: Had a cardiac myoviews  In  July 2017 whch was normal except for :LVH . Hypertension resting bp was 172/96 :  Outpatient Medications Prior to Visit  Medication Sig Dispense Refill  . albuterol (VENTOLIN HFA) 108 (90 Base) MCG/ACT  inhaler Inhale 2 puffs into the lungs every 6 (six) hours as needed for wheezing or shortness of breath.    . ALPRAZolam (XANAX) 0.25 MG tablet Take 1 tablet (0.25 mg total) by mouth 2 (two) times daily as needed for anxiety. 20 tablet 0  . APPLE CIDER VINEGAR PO Take 3 capsules by mouth daily.    . Azelastine HCl 0.15 % SOLN U 1 TO 2 SPRAYS IEN QD  6  . b complex vitamins tablet Take 1 tablet by mouth daily.    . bimatoprost (LUMIGAN) 0.03 % ophthalmic solution Place 1 drop into both eyes at bedtime.    . budesonide (PULMICORT) 180 MCG/ACT inhaler Inhale 2 puffs into the lungs 2 (two) times daily.    . cetirizine (ZYRTEC) 10 MG tablet Take 10 mg by mouth daily.      Marland Kitchen. desonide (DESOWEN) 0.05 % ointment Apply topically as needed.      Marland Kitchen. MILK THISTLE PO Take 3 capsules by mouth daily.    . Probiotic Product (PROBIOTIC COMPLEX ACIDOPHILUS PO) Take 1 capsule by mouth daily.    . sodium chloride (OCEAN) 0.65 % SOLN nasal spray Place 1 spray into the nose as needed.      . TURMERIC CURCUMIN PO Take 1 capsule by mouth 2 (two) times daily.     .Marland Kitchen  aspirin EC 81 MG tablet Take 81 mg by mouth daily.    . beclomethasone (QVAR) 80 MCG/ACT inhaler Inhale 1 puff into the lungs daily as needed.      . Cholecalciferol (VITAMIN D3) 2000 units TABS Take 1 tablet by mouth daily.    . clobetasol (OLUX) 0.05 % topical foam Apply topically as needed.      . cyanocobalamin 1000 MCG tablet Take 1,000 mcg by mouth daily.    . Ginkgo Biloba 120 MG CAPS Take 1 capsule by mouth daily.    . Multiple Vitamins-Minerals (CENTRUM SILVER PO) Take by mouth daily.      Marland Kitchen RESVERATROL PO Take 100 mcg by mouth daily.     No facility-administered medications prior to visit.     Review of Systems;  Patient denies headache, fevers, malaise, unintentional weight loss, skin rash, eye pain, sinus congestion and sinus pain, sore throat, dysphagia,  hemoptysis , cough, dyspnea, wheezing, chest pain, palpitations, orthopnea, edema,  abdominal pain, nausea, melena, diarrhea, constipation, flank pain, dysuria, hematuria, urinary  Frequency, nocturia, numbness, tingling, seizures,  Focal weakness, Loss of consciousness,  Tremor, insomnia, depression, anxiety, and suicidal ideation.      Objective:  BP 140/88 (BP Location: Left Arm, Patient Position: Sitting, Cuff Size: Large)   Pulse 88   Temp 98.1 F (36.7 C) (Oral)   Resp 15   Ht 5\' 6"  (1.676 m)   Wt 220 lb (99.8 kg)   SpO2 96%   BMI 35.51 kg/m   BP Readings from Last 3 Encounters:  03/19/17 140/88  10/21/15 138/86  05/21/15 138/84    Wt Readings from Last 3 Encounters:  03/19/17 220 lb (99.8 kg)  10/21/15 224 lb 12.8 oz (102 kg)  08/11/15 223 lb (101.2 kg)    General appearance: alert, cooperative and appears stated age Ears: normal TM's and external ear canals both ears Throat: lips, mucosa, and tongue normal; teeth and gums normal Scalp:  Thinning of hair noted at crown and left parietal area . Neck: no adenopathy, no carotid bruit, supple, symmetrical, trachea midline and thyroid not enlarged, symmetric, no tenderness/mass/nodules Back: symmetric, no curvature. ROM normal. No CVA tenderness. Lungs: clear to auscultation bilaterally Heart: regular rate and rhythm, S1, S2 normal, no murmur, click, rub or gallop Abdomen: soft, non-tender; bowel sounds normal; no masses,  no organomegaly Pulses: 2+ and symmetric Skin: Skin color, texture, turgor normal. No rashes or lesions Lymph nodes: Cervical, supraclavicular, and axillary nodes normal.  Lab Results  Component Value Date   HGBA1C 6.5 03/19/2017   HGBA1C 6.3 04/12/2015   HGBA1C 6.1 10/12/2014    Lab Results  Component Value Date   CREATININE 0.78 03/19/2017   CREATININE 0.78 04/12/2015   CREATININE 0.77 10/12/2014    Lab Results  Component Value Date   WBC 12.0 (H) 03/19/2017   HGB 14.1 03/19/2017   HCT 41.1 03/19/2017   PLT 325.0 03/19/2017   GLUCOSE 110 (H) 03/19/2017   CHOL 194  03/19/2017   TRIG 172.0 (H) 03/19/2017   HDL 53.30 03/19/2017   LDLDIRECT 113.0 10/12/2014   LDLCALC 106 (H) 03/19/2017   ALT 52 (H) 03/19/2017   AST 40 (H) 03/19/2017   NA 139 03/19/2017   K 4.6 03/19/2017   CL 101 03/19/2017   CREATININE 0.78 03/19/2017   BUN 11 03/19/2017   CO2 29 03/19/2017   TSH 3.56 03/19/2017   INR 1.1 07/27/2007   HGBA1C 6.5 03/19/2017   MICROALBUR 1.6 03/19/2017  Mm Screening Breast Tomo Bilateral  Result Date: 12/14/2016 CLINICAL DATA:  Screening. EXAM: 2D DIGITAL SCREENING BILATERAL MAMMOGRAM WITH CAD AND ADJUNCT TOMO COMPARISON:  Previous exam(s). ACR Breast Density Category b: There are scattered areas of fibroglandular density. FINDINGS: There are no findings suspicious for malignancy. Images were processed with CAD. IMPRESSION: No mammographic evidence of malignancy. A result letter of this screening mammogram will be mailed directly to the patient. RECOMMENDATION: Screening mammogram in one year. (Code:SM-B-01Y) BI-RADS CATEGORY  1: Negative. Electronically Signed   By: Annia Belt M.D.   On: 12/14/2016 16:03    Assessment & Plan:   Problem List Items Addressed This Visit    Fatty liver - Primary    Presumed by ultrasound changes and serologies negative for autoimmune causes of hepatitis.  Current liver enzymes are elevated and all modifiable risk factors were addressed with patient today including obesity, metabolic syndrome and hyperlipidemia .  Hep A/B vaccine series has been given.   Lab Results  Component Value Date   ALT 52 (H) 03/19/2017   AST 40 (H) 03/19/2017   ALKPHOS 75 03/19/2017   BILITOT 1.4 (H) 03/19/2017           Relevant Orders   Comprehensive metabolic panel (Completed)   OSA on CPAP    Diagnosed by sleep study. She is wearing her CPAP every night a minimum of 6 hours per night and notes persistent fatigue .  Advised to avoid new apparatus recommended by dentist, continue use of CPAP and increase efforts to lose  weight       Neuropathy of foot     B12 deficiency has been addressed with no change in symptoms .Marland Kitchen Heavy metals screen was normal (2012) .      B12 deficiency    Managed with oral supplements  Lab Results  Component Value Date   VITAMINB12 331 03/19/2017         Relevant Orders   Vitamin B12 (Completed)   Folate RBC   Hyperlipidemia LDL goal <130    10 YR RISK  Using FRC is 23%  Will again recommend statin therapy, which she has refused  In the past  despite a history of diet controlled diabetes and recent carotid artery screenings which suggest nonocclusive PAD.   Lab Results  Component Value Date   CHOL 194 03/19/2017   HDL 53.30 03/19/2017   LDLCALC 106 (H) 03/19/2017   LDLDIRECT 113.0 10/12/2014   TRIG 172.0 (H) 03/19/2017   CHOLHDL 4 03/19/2017   Lab Results  Component Value Date   ALT 52 (H) 03/19/2017   AST 40 (H) 03/19/2017   ALKPHOS 75 03/19/2017   BILITOT 1.4 (H) 03/19/2017           Relevant Orders   Lipid panel (Completed)   Type 2 diabetes mellitus with neurological complications (HCC)    Fasting glucoses have been well under 125 but A1c has risen to 6.5  HAs had her annual eye exam .   she is now taking a baby aspirin  . Not on Statin or an ACE inhibitor because she refuses to take medications..   Low GI diet and exercise recommended   Lab Results  Component Value Date   HGBA1C 6.5 03/19/2017   Lab Results  Component Value Date   MICROALBUR 1.6 03/19/2017            Screening for colon cancer    cologuard ordered .  Last colonoscopy normal in 2006  Hypertension      With LVH on myoview and new onset diabetes.  Will recommend initiation of  ARB to induce regression       Relevant Orders   Microalbumin / creatinine urine ratio (Completed)   Vitamin D deficiency   Relevant Orders   VITAMIN D 25 Hydroxy (Vit-D Deficiency, Fractures) (Completed)   Fatigue   Relevant Orders   Magnesium (Completed)   TSH (Completed)   CBC  with Differential/Platelet (Completed)   T4, free (Completed)   Alopecia areata    Repeat screening ea normal  TSH and a slightly low Free T4 . She takes multiple supplements which may be interfering.  Will reheck in 1 month after supplements have been suspended.    Lab Results  Component Value Date   TSH 3.56 03/19/2017         Other Visit Diagnoses    Prediabetes       Relevant Orders   Hemoglobin A1c (Completed)      I have discontinued Liborio Nixon Sassone's beclomethasone, Multiple Vitamins-Minerals (CENTRUM SILVER PO), clobetasol, Ginkgo Biloba, RESVERATROL PO, cyanocobalamin, Vitamin D3, and aspirin EC. I am also having her maintain her cetirizine, sodium chloride, desonide, Probiotic Product (PROBIOTIC COMPLEX ACIDOPHILUS PO), b complex vitamins, TURMERIC CURCUMIN PO, bimatoprost, ALPRAZolam, Azelastine HCl, albuterol, budesonide, MILK THISTLE PO, and APPLE CIDER VINEGAR PO.  No orders of the defined types were placed in this encounter.   Medications Discontinued During This Encounter  Medication Reason  . aspirin EC 81 MG tablet Patient has not taken in last 30 days  . beclomethasone (QVAR) 80 MCG/ACT inhaler Change in therapy  . Cholecalciferol (VITAMIN D3) 2000 units TABS Patient has not taken in last 30 days  . clobetasol (OLUX) 0.05 % topical foam Patient has not taken in last 30 days  . cyanocobalamin 1000 MCG tablet Patient has not taken in last 30 days  . Ginkgo Biloba 120 MG CAPS Patient has not taken in last 30 days  . Multiple Vitamins-Minerals (CENTRUM SILVER PO) Patient has not taken in last 30 days  . RESVERATROL PO Patient has not taken in last 30 days   A total of 40 minutes was spent with patient more than half of which was spent in counseling patient on the above mentioned issues , reviewing and explaining recent labs and imaging studies done, and coordination of care. Follow-up: Return in about 6 months (around 09/19/2017).   Sherlene Shams, MD

## 2017-03-19 NOTE — Assessment & Plan Note (Signed)
Diagnosed by sleep study. She is wearing her CPAP every night a minimum of 6 hours per night and notes persistent fatigue .  Advised to avoid new apparatus recommended by dentist, continue use of CPAP and increase efforts to lose weight  

## 2017-03-19 NOTE — Assessment & Plan Note (Signed)
Managed with oral supplements  Lab Results  Component Value Date   VITAMINB12 331 03/19/2017

## 2017-03-19 NOTE — Assessment & Plan Note (Addendum)
Repeat screening ea normal  TSH and a slightly low Free T4 . She takes multiple supplements which may be interfering.  Will reheck in 1 month after supplements have been suspended.    Lab Results  Component Value Date   TSH 3.56 03/19/2017

## 2017-03-19 NOTE — Assessment & Plan Note (Signed)
cologuard ordered .  Last colonoscopy normal in 2006

## 2017-03-19 NOTE — Assessment & Plan Note (Addendum)
Fasting glucoses have been well under 125 but A1c has risen to 6.5  HAs had her annual eye exam .   she is now taking a baby aspirin  . Not on Statin or an ACE inhibitor because she refuses to take medications..   Low GI diet and exercise recommended   Lab Results  Component Value Date   HGBA1C 6.5 03/19/2017   Lab Results  Component Value Date   MICROALBUR 1.6 03/19/2017

## 2017-03-19 NOTE — Assessment & Plan Note (Addendum)
With LVH on myoview and new onset diabetes.  Will recommend initiation of  ARB to induce regression

## 2017-03-20 LAB — FOLATE RBC: RBC Folate: 675 ng/mL RBC (ref >280)

## 2017-03-28 ENCOUNTER — Ambulatory Visit: Payer: Medicare Other

## 2017-03-28 DIAGNOSIS — I10 Essential (primary) hypertension: Secondary | ICD-10-CM

## 2017-03-28 NOTE — Progress Notes (Signed)
Patient comes in for blood pressure check she brings in home blood pressure monitor as well .  She has been monitoring blood pressure at home readings placed in your folder for review.   Blood pressure checked in  left arm checked with large cuff 124/78 right arm 128/76 pulse 81  O2 96% . Blood pressure on her monitor 140/88 checked on left arm.   Patient denies headache just fatigue, reports no swelling or chest pain.

## 2017-03-30 NOTE — Progress Notes (Signed)
  I have reviewed the above information and the home readings and agree with above. No medication changes are needed   Duncan Dulleresa Kataleah Bejar, MD

## 2017-03-30 NOTE — Progress Notes (Signed)
Tried calling patient number busy.  

## 2017-04-03 NOTE — Progress Notes (Signed)
cologuard order has been faxed.

## 2017-04-05 ENCOUNTER — Telehealth: Payer: Self-pay | Admitting: *Deleted

## 2017-04-05 NOTE — Telephone Encounter (Signed)
Did you happen to call this pt this morning?

## 2017-04-05 NOTE — Telephone Encounter (Signed)
Copied from CRM [id number]. Topic: General - Call Back - No Documentation >> Apr 03, 2017  1:56 PM Landry MellowFoltz, Melissa J wrote: Reason for CRM: please call pt back, no crm  [phone]  >> Apr 05, 2017 12:41 PM Maia Pettiesrtiz, Kristie S wrote: Pt returning missed call from this morning. She did not have a message. No notes in Epic. Please call back [phone].

## 2017-04-05 NOTE — Progress Notes (Signed)
Patient advised of below and verbalized understanding.  

## 2017-04-05 NOTE — Telephone Encounter (Addendum)
Returned patients call in regards to nurse blood pressure check on 03/28/17, see clinical note for documentation

## 2017-04-17 ENCOUNTER — Telehealth: Payer: Self-pay | Admitting: Radiology

## 2017-04-17 DIAGNOSIS — R7989 Other specified abnormal findings of blood chemistry: Secondary | ICD-10-CM

## 2017-04-17 NOTE — Telephone Encounter (Signed)
Pt coming in tomorrow for labs, please place future orders. Thank you.  

## 2017-04-18 ENCOUNTER — Other Ambulatory Visit (INDEPENDENT_AMBULATORY_CARE_PROVIDER_SITE_OTHER): Payer: Medicare Other

## 2017-04-18 DIAGNOSIS — R7989 Other specified abnormal findings of blood chemistry: Secondary | ICD-10-CM

## 2017-04-18 LAB — TSH: TSH: 3.89 u[IU]/mL (ref 0.35–4.50)

## 2017-04-18 LAB — T4, FREE: Free T4: 0.63 ng/dL (ref 0.60–1.60)

## 2017-04-19 ENCOUNTER — Other Ambulatory Visit: Payer: Self-pay | Admitting: Internal Medicine

## 2017-04-19 DIAGNOSIS — E034 Atrophy of thyroid (acquired): Secondary | ICD-10-CM

## 2017-04-19 MED ORDER — LEVOTHYROXINE SODIUM 50 MCG PO TABS: 50.0000 ug | ORAL_TABLET | Freq: Every day | ORAL | 3 refills | Status: DC

## 2017-04-25 ENCOUNTER — Other Ambulatory Visit: Payer: Self-pay

## 2017-04-25 ENCOUNTER — Encounter: Payer: Self-pay | Admitting: Internal Medicine

## 2017-04-25 ENCOUNTER — Ambulatory Visit (INDEPENDENT_AMBULATORY_CARE_PROVIDER_SITE_OTHER): Payer: Medicare Other | Admitting: Internal Medicine

## 2017-04-25 VITALS — BP 144/82 | HR 73 | Temp 97.9°F | Wt 220.2 lb

## 2017-04-25 DIAGNOSIS — E039 Hypothyroidism, unspecified: Secondary | ICD-10-CM | POA: Diagnosis not present

## 2017-04-25 DIAGNOSIS — E1149 Type 2 diabetes mellitus with other diabetic neurological complication: Secondary | ICD-10-CM | POA: Diagnosis not present

## 2017-04-25 DIAGNOSIS — Z8739 Personal history of other diseases of the musculoskeletal system and connective tissue: Secondary | ICD-10-CM | POA: Diagnosis not present

## 2017-04-25 DIAGNOSIS — L409 Psoriasis, unspecified: Secondary | ICD-10-CM

## 2017-04-25 DIAGNOSIS — I10 Essential (primary) hypertension: Secondary | ICD-10-CM | POA: Diagnosis not present

## 2017-04-25 MED ORDER — ERGOCALCIFEROL 1.25 MG (50000 UT) PO CAPS: 50000.0000 [IU] | ORAL_CAPSULE | ORAL | 0 refills | Status: DC

## 2017-04-25 MED ORDER — DICLOFENAC SODIUM ER 100 MG PO TB24: 100.0000 mg | ORAL_TABLET | Freq: Every day | ORAL | 1 refills | Status: DC

## 2017-04-25 MED ORDER — TELMISARTAN 20 MG PO TABS: 20.0000 mg | ORAL_TABLET | Freq: Every day | ORAL | 5 refills | Status: DC

## 2017-04-25 NOTE — Progress Notes (Signed)
Subjective:  Patient ID: [name], female    DOB: 01/21/1945  Age: 72 y.o. MRN: [id number]  CC: The primary encounter diagnosis was Essential hypertension. Diagnoses of Psoriasis, Hypothyroidism (acquired), Type 2 diabetes mellitus with neurological complications (HCC), Morbid obesity (HCC), and Personal history of gout were also pertinent to this visit.  HPI Maria KoyanagiJanice Fletcher presents for one month follow up on abnormal labs done in March with history of fatty liver,  new onset hypertension and new onset  Type 2 DM  Lab Results  Component Value Date   HGBA1C 6.5 03/19/2017    HTN:  New diagnosis . Records reviewed: she has LVH by prior stress test ECHO.  Discussed therapy choices and recommended  ARB telmisartan  Obesity:  Not exercising due to persistent foot pain and leg pain,  Has tried  using mobic but he medication causes an abnormal sensation in heart that she cannot describe .  Denies palpitations and chest pain   Was treated presumptively for gout  Involving  right wrist in January by Urgent Care with prednisone and vicodin. First and only recurrence.   Sees Dr Alvester MorinBell for annual eye exams. .    Vitamin D deficiency ,  Chronic  Psoriasis affecting scalp and neck ,  Using Gold Bond Anti itch    Outpatient Medications Prior to Visit  Medication Sig Dispense Refill  . albuterol (VENTOLIN HFA) 108 (90 Base) MCG/ACT inhaler Inhale 2 puffs into the lungs every 6 (six) hours as needed for wheezing or shortness of breath.    . ALPRAZolam (XANAX) 0.25 MG tablet Take 1 tablet (0.25 mg total) by mouth 2 (two) times daily as needed for anxiety. 20 tablet 0  . APPLE CIDER VINEGAR PO Take 3 capsules by mouth daily.    . Azelastine HCl 0.15 % SOLN U 1 TO 2 SPRAYS IEN QD  6  . b complex vitamins tablet Take 1 tablet by mouth daily.    . bimatoprost (LUMIGAN) 0.03 % ophthalmic solution Place 1 drop into both eyes at bedtime.    . budesonide (PULMICORT) 180 MCG/ACT inhaler Inhale 2 puffs into the  lungs 2 (two) times daily.    . cetirizine (ZYRTEC) 10 MG tablet Take 10 mg by mouth daily.      Marland Kitchen. desonide (DESOWEN) 0.05 % ointment Apply topically as needed.      Marland Kitchen. MILK THISTLE PO Take 3 capsules by mouth daily.    . Probiotic Product (PROBIOTIC COMPLEX ACIDOPHILUS PO) Take 1 capsule by mouth daily.    . sodium chloride (OCEAN) 0.65 % SOLN nasal spray Place 1 spray into the nose as needed.      . TURMERIC CURCUMIN PO Take 1 capsule by mouth 2 (two) times daily.     Marland Kitchen. levothyroxine (SYNTHROID, LEVOTHROID) 50 MCG tablet Take 1 tablet (50 mcg total) by mouth daily. 90 tablet 3   No facility-administered medications prior to visit.     Review of Systems;  Patient denies headache, fevers, malaise, unintentional weight loss, skin rash, eye pain, sinus congestion and sinus pain, sore throat, dysphagia,  hemoptysis , cough, dyspnea, wheezing, chest pain, palpitations, orthopnea, edema, abdominal pain, nausea, melena, diarrhea, constipation, flank pain, dysuria, hematuria, urinary  Frequency, nocturia, numbness, tingling, seizures,  Focal weakness, Loss of consciousness,  Tremor, insomnia, depression, anxiety, and suicidal ideation.      Objective:  BP (!) 144/82 (BP Location: Left Arm, Patient Position: Sitting, Cuff Size: Normal)   Pulse 73   Temp 97.9 F (  36.6 C)   Wt 220 lb 3.2 oz (99.9 kg)   SpO2 96%   BMI 35.54 kg/m   BP Readings from Last 3 Encounters:  04/25/17 (!) 144/82  03/19/17 140/88  10/21/15 138/86    Wt Readings from Last 3 Encounters:  04/25/17 220 lb 3.2 oz (99.9 kg)  03/19/17 220 lb (99.8 kg)  10/21/15 224 lb 12.8 oz (102 kg)    General appearance: alert, cooperative and appears stated age Ears: normal TM's and external ear canals both ears Throat: lips, mucosa, and tongue normal; teeth and gums normal Neck: no adenopathy, no carotid bruit, supple, symmetrical, trachea midline and thyroid not enlarged, symmetric, no tenderness/mass/nodules Back: symmetric, no  curvature. ROM normal. No CVA tenderness. Lungs: clear to auscultation bilaterally Heart: regular rate and rhythm, S1, S2 normal, no murmur, click, rub or gallop Abdomen: soft, non-tender; bowel sounds normal; no masses,  no organomegaly Pulses: 2+ and symmetric Skin: Skin color, texture, turgor normal. No rashes or lesions Lymph nodes: Cervical, supraclavicular, and axillary nodes normal.  Lab Results  Component Value Date   HGBA1C 6.5 03/19/2017   HGBA1C 6.3 04/12/2015   HGBA1C 6.1 10/12/2014    Lab Results  Component Value Date   CREATININE 0.78 03/19/2017   CREATININE 0.78 04/12/2015   CREATININE 0.77 10/12/2014    Lab Results  Component Value Date   WBC 12.0 (H) 03/19/2017   HGB 14.1 03/19/2017   HCT 41.1 03/19/2017   PLT 325.0 03/19/2017   GLUCOSE 110 (H) 03/19/2017   CHOL 194 03/19/2017   TRIG 172.0 (H) 03/19/2017   HDL 53.30 03/19/2017   LDLDIRECT 113.0 10/12/2014   LDLCALC 106 (H) 03/19/2017   ALT 52 (H) 03/19/2017   AST 40 (H) 03/19/2017   NA 139 03/19/2017   K 4.6 03/19/2017   CL 101 03/19/2017   CREATININE 0.78 03/19/2017   BUN 11 03/19/2017   CO2 29 03/19/2017   TSH 3.89 04/18/2017   INR 1.1 07/27/2007   HGBA1C 6.5 03/19/2017   MICROALBUR 1.6 03/19/2017    Mm Screening Breast Tomo Bilateral  Result Date: 12/14/2016 CLINICAL DATA:  Screening. EXAM: 2D DIGITAL SCREENING BILATERAL MAMMOGRAM WITH CAD AND ADJUNCT TOMO COMPARISON:  Previous exam(s). ACR Breast Density Category b: There are scattered areas of fibroglandular density. FINDINGS: There are no findings suspicious for malignancy. Images were processed with CAD. IMPRESSION: No mammographic evidence of malignancy. A result letter of this screening mammogram will be mailed directly to the patient. RECOMMENDATION: Screening mammogram in one year. (Code:SM-B-01Y) BI-RADS CATEGORY  1: Negative. Electronically Signed   By: Annia Belt M.D.   On: 12/14/2016 16:03    Assessment & Plan:   Problem List  Items Addressed This Visit    Type 2 diabetes mellitus with neurological complications (HCC)    Fasting glucoses have been well under 125 but A1c has risen to 6.5  She has had her annual eye exam .   she is now taking a baby aspirin  . Not on Statin (refuses statin therapy) ,  But ARB advised given new onset hypertension with LVH noted on prior ECHO    Low GI/Mediterranean  diet and exercise recommended   Lab Results  Component Value Date   HGBA1C 6.5 03/19/2017   Lab Results  Component Value Date   MICROALBUR 1.6 03/19/2017            Relevant Medications   telmisartan (MICARDIS) 20 MG tablet   Psoriasis    Dermatology referral offered but  deferred       Personal history of gout    Presumed,  Involving wrist.  Resolved with prednisone treatment by Urgent Care.  Will ceck uric acid level at next draw       Relevant Orders   Uric acid   Morbid obesity (HCC)    I have addressed  BMI and recommended wt loss of 10% of body weight over the next 6 months using a low fat, low starch, high protein  fruit/vegetable based Mediterranean diet and 30 minutes of aerobic exercise a minimum of 5 days per week.        Hypothyroidism (acquired)    Subclinical vs clinical.  Fatigue, hair loss may be attributable to other causes.  She has decided NOT to take levothyroxine after reading about the side effects.  Discussed the risks of undertreated and overtreated thyroid deficiency.  Will repeat tsh in 6 months      Hypertension - Primary    Starting telmisartan given LVH on ECHO and new onset Type 2 DM. She will return in one week for bmet and bp check  Lab Results  Component Value Date   CREATININE 0.78 03/19/2017   Lab Results  Component Value Date   NA 139 03/19/2017   K 4.6 03/19/2017   CL 101 03/19/2017   CO2 29 03/19/2017         Relevant Medications   telmisartan (MICARDIS) 20 MG tablet   Other Relevant Orders   Basic metabolic panel      I have discontinued Lashala  Castille's levothyroxine. I am also having her start on telmisartan, Diclofenac Sodium CR, and ergocalciferol. Additionally, I am having her maintain her cetirizine, sodium chloride, desonide, Probiotic Product (PROBIOTIC COMPLEX ACIDOPHILUS PO), b complex vitamins, TURMERIC CURCUMIN PO, bimatoprost, ALPRAZolam, Azelastine HCl, albuterol, budesonide, MILK THISTLE PO, and APPLE CIDER VINEGAR PO.  Meds ordered this encounter  Medications  . telmisartan (MICARDIS) 20 MG tablet    Sig: Take 1 tablet (20 mg total) by mouth daily.    Dispense:  30 tablet    Refill:  5  . Diclofenac Sodium CR 100 MG 24 hr tablet    Sig: Take 1 tablet (100 mg total) by mouth daily.    Dispense:  30 tablet    Refill:  1  . ergocalciferol (DRISDOL) 50000 units capsule    Sig: Take 1 capsule (50,000 Units total) by mouth once a week.    Dispense:  12 capsule    Refill:  0    Medications Discontinued During This Encounter  Medication Reason  . levothyroxine (SYNTHROID, LEVOTHROID) 50 MCG tablet     Follow-up: Return in about 6 months (around 10/25/2017) for follow up diabetes.   Sherlene Shams, MD

## 2017-04-25 NOTE — Patient Instructions (Addendum)
You have diet controlled Diabetes Mellitus.  The Mediterranean  Diet is best and will not lead to gout attacks   Check a post prandial blood sugar (2 hrs after eating) and if < 160,   The meal is fine to have regularly,  If > 160  But  < 200,  Ok to have 1 or  2  times per week ,  > 200 ,  Once a week    Starting telmisartan for blood pressure,  Once daily (evening or morning ,  Doesn't matter)   Diclofenac as a substutition for mobic ,  Once daily as needed for joint pain .  Do not combine with mobic or motrin   1000 mg of tylenol can be used daily for additional pain control  Return for bp check and blood test in one week (RN Visit and lab visit)  Vitamin D mega dose 50,000 Ius weekly for 3 months

## 2017-04-28 DIAGNOSIS — E039 Hypothyroidism, unspecified: Secondary | ICD-10-CM | POA: Insufficient documentation

## 2017-04-28 DIAGNOSIS — Z8739 Personal history of other diseases of the musculoskeletal system and connective tissue: Secondary | ICD-10-CM | POA: Insufficient documentation

## 2017-04-28 NOTE — Assessment & Plan Note (Signed)
Starting telmisartan given LVH on ECHO and new onset Type 2 DM. She will return in one week for bmet and bp check  Lab Results  Component Value Date   CREATININE 0.78 03/19/2017   Lab Results  Component Value Date   NA 139 03/19/2017   K 4.6 03/19/2017   CL 101 03/19/2017   CO2 29 03/19/2017

## 2017-04-28 NOTE — Assessment & Plan Note (Signed)
Fasting glucoses have been well under 125 but A1c has risen to 6.5  She has had her annual eye exam .   she is now taking a baby aspirin  . Not on Statin (refuses statin therapy) ,  But ARB advised given new onset hypertension with LVH noted on prior ECHO    Low GI/Mediterranean  diet and exercise recommended   Lab Results  Component Value Date   HGBA1C 6.5 03/19/2017   Lab Results  Component Value Date   MICROALBUR 1.6 03/19/2017

## 2017-04-28 NOTE — Assessment & Plan Note (Addendum)
Dermatology referral offered but deferred

## 2017-04-28 NOTE — Assessment & Plan Note (Signed)
I have addressed  BMI and recommended wt loss of 10% of body weight over the next 6 months using a low fat, low starch, high protein  fruit/vegetable based Mediterranean diet and 30 minutes of aerobic exercise a minimum of 5 days per week.   

## 2017-04-28 NOTE — Assessment & Plan Note (Signed)
Subclinical vs clinical.  Fatigue, hair loss may be attributable to other causes.  She has decided NOT to take levothyroxine after reading about the side effects.  Discussed the risks of undertreated and overtreated thyroid deficiency.  Will repeat tsh in 6 months

## 2017-04-28 NOTE — Assessment & Plan Note (Addendum)
Presumed,  Involving wrist.  Resolved with prednisone treatment by Urgent Care.  Will ceck uric acid level at next draw

## 2017-05-01 LAB — COLOGUARD: Cologuard: NEGATIVE

## 2017-05-02 ENCOUNTER — Telehealth: Payer: Self-pay | Admitting: *Deleted

## 2017-05-02 ENCOUNTER — Ambulatory Visit (INDEPENDENT_AMBULATORY_CARE_PROVIDER_SITE_OTHER): Payer: Medicare Other | Admitting: *Deleted

## 2017-05-02 ENCOUNTER — Other Ambulatory Visit (INDEPENDENT_AMBULATORY_CARE_PROVIDER_SITE_OTHER): Payer: Medicare Other

## 2017-05-02 VITALS — BP 130/84 | HR 71 | Resp 16

## 2017-05-02 DIAGNOSIS — I10 Essential (primary) hypertension: Secondary | ICD-10-CM

## 2017-05-02 LAB — BASIC METABOLIC PANEL
BUN: 11 mg/dL (ref 6–23)
CO2: 27 mEq/L (ref 19–32)
Calcium: 8.8 mg/dL (ref 8.4–10.5)
Chloride: 102 mEq/L (ref 96–112)
Creatinine, Ser: 0.84 mg/dL (ref 0.40–1.20)
GFR: 70.71 mL/min (ref >60.00)
Glucose, Bld: 118 mg/dL — ABNORMAL HIGH (ref 70–99)
Potassium: 4.2 mEq/L (ref 3.5–5.1)
Sodium: 136 mEq/L (ref 135–145)

## 2017-05-02 NOTE — Telephone Encounter (Signed)
DO NOT ADD THE ADDITIONAL TESTS  ,  THEY ARE DUE IN LATE MAY THANK YOU FOR CHECKING !

## 2017-05-02 NOTE — Progress Notes (Addendum)
Patient in for one week re-check on BP after starting telmisartan 20 mg, Left arm BP 124/84 pulse 71, patient allowed rest additional 10 -12 minutes BP taken in right arm 130/84 pulse 71.    I have reviewed the above information and agree with above.  No changes to medication needed  Duncan Dulleresa Tullo, MD

## 2017-05-02 NOTE — Telephone Encounter (Signed)
Pt had labs done today for BMP & a BP check. Please let me know is you would like me to add the additional test ordered future. The last note states that pt need to recheck in 6 weeks (late May). I told pt that she would need another lab appt unless she hears anything else from us. There is a uric acid & TSH ordered future (all ordered on different dates)

## 2017-05-02 NOTE — Telephone Encounter (Signed)
Will do, thanks

## 2017-05-09 NOTE — Progress Notes (Signed)
Patient aware and voiced understanding

## 2017-05-14 ENCOUNTER — Ambulatory Visit: Payer: Self-pay

## 2017-05-14 NOTE — Telephone Encounter (Signed)
Patient called in with c/o "abdominal pain." She says "on Saturday, I noticed a soreness to my right abdomen, in the middle between my belly button and pelvis. Yesterday, the soreness became pain and it was an 8. I went to bed and woke up and the soreness is there, about a 4 on the pain scale. It is a constant discomfort, no pain at this time." I asked about her bowels, she says "I have irritable bowel, so I go between loose and constipation. Right now it's loose." I asked about other symptoms, she says "no." According to protocol, see PCP within 24 hours, I offered to reschedule the appointment scheduled by the [facility] Agent prior to transfer to [facility], she says "I will just keep the one with Dr. Darrick Huntsman on Wednesday at 1030. I would rather see her." Care advice given, patient verbalized understanding.   Reason for Disposition . Age > 60 years  Answer Assessment - Initial Assessment Questions 1. LOCATION: "Where does it hurt?"      Right side  2. RADIATION: "Does the pain shoot anywhere else?" (e.g., chest, back)     No 3. ONSET: "When did the pain begin?" (e.g., minutes, hours or days ago)      Saturday night 4. SUDDEN: "Gradual or sudden onset?"     Sudden 5. PATTERN "Does the pain come and go, or is it constant?"    - If constant: "Is it getting better, staying the same, or worsening?"      (Note: Constant means the pain never goes away completely; most serious pain is constant and it progresses)     - If intermittent: "How long does it last?" "Do you have pain now?"     (Note: Intermittent means the pain goes away completely between bouts)     Constant discomfort 6. SEVERITY: "How bad is the pain?"  (e.g., Scale 1-10; mild, moderate, or severe)   - MILD (1-3): doesn't interfere with normal activities, abdomen soft and not tender to touch    - MODERATE (4-7): interferes with normal activities or awakens from sleep, tender to touch    - SEVERE (8-10): excruciating pain, doubled over, unable to do  any normal activities      4 7. RECURRENT SYMPTOM: "Have you ever had this type of abdominal pain before?" If so, ask: "When was the last time?" and "What happened that time?"      No 8. CAUSE: "What do you think is causing the abdominal pain?"     I don't know 9. RELIEVING/AGGRAVATING FACTORS: "What makes it better or worse?" (e.g., movement, antacids, bowel movement)     Nothing 10. OTHER SYMPTOMS: "Has there been any vomiting, diarrhea, constipation, or urine problems?"       No 11. PREGNANCY: "Is there any chance you are pregnant?" "When was your last menstrual period?"       No  Protocols used: ABDOMINAL PAIN - [facility]

## 2017-05-16 ENCOUNTER — Ambulatory Visit
Admission: RE | Admit: 2017-05-16 | Discharge: 2017-05-16 | Disposition: A | Discharge status: Home / Self Care | Payer: Medicare Other | Source: Ambulatory Visit | Attending: Internal Medicine | Admitting: Internal Medicine

## 2017-05-16 ENCOUNTER — Telehealth: Payer: Self-pay | Admitting: Internal Medicine

## 2017-05-16 ENCOUNTER — Encounter: Payer: Self-pay | Admitting: Internal Medicine

## 2017-05-16 ENCOUNTER — Ambulatory Visit (INDEPENDENT_AMBULATORY_CARE_PROVIDER_SITE_OTHER): Payer: Medicare Other | Admitting: Internal Medicine

## 2017-05-16 VITALS — BP 134/82 | HR 79 | Temp 98.3°F | Resp 15 | Ht 66.0 in | Wt 218.0 lb

## 2017-05-16 DIAGNOSIS — Z1211 Encounter for screening for malignant neoplasm of colon: Secondary | ICD-10-CM | POA: Diagnosis not present

## 2017-05-16 DIAGNOSIS — R1031 Right lower quadrant pain: Secondary | ICD-10-CM | POA: Diagnosis present

## 2017-05-16 DIAGNOSIS — Z90710 Acquired absence of both cervix and uterus: Secondary | ICD-10-CM | POA: Insufficient documentation

## 2017-05-16 DIAGNOSIS — K5730 Diverticulosis of large intestine without perforation or abscess without bleeding: Secondary | ICD-10-CM | POA: Diagnosis not present

## 2017-05-16 DIAGNOSIS — K760 Fatty (change of) liver, not elsewhere classified: Secondary | ICD-10-CM

## 2017-05-16 HISTORY — DX: Unspecified asthma, uncomplicated: J45.909

## 2017-05-16 LAB — CBC WITH DIFFERENTIAL/PLATELET
Basophils Absolute: 0.1 10*3/uL (ref 0.0–0.1)
Basophils Relative: 0.6 % (ref 0.0–3.0)
Eosinophils Absolute: 0.2 10*3/uL (ref 0.0–0.7)
Eosinophils Relative: 1.9 % (ref 0.0–5.0)
HCT: 41.5 % (ref 36.0–46.0)
Hemoglobin: 14.3 g/dL (ref 12.0–15.0)
Lymphocytes Relative: 43.2 % (ref 12.0–46.0)
Lymphs Abs: 4.6 10*3/uL — ABNORMAL HIGH (ref 0.7–4.0)
MCHC: 34.6 g/dL (ref 30.0–36.0)
MCV: 87.4 fl (ref 78.0–100.0)
Monocytes Absolute: 0.9 10*3/uL (ref 0.1–1.0)
Monocytes Relative: 8.1 % (ref 3.0–12.0)
Neutro Abs: 4.9 10*3/uL (ref 1.4–7.7)
Neutrophils Relative %: 46.2 % (ref 43.0–77.0)
Platelets: 321 10*3/uL (ref 150.0–400.0)
RBC: 4.75 Mil/uL (ref 3.87–5.11)
RDW: 13 % (ref 11.5–15.5)
WBC: 10.7 10*3/uL — ABNORMAL HIGH (ref 4.0–10.5)

## 2017-05-16 LAB — C-REACTIVE PROTEIN: CRP: 1.5 mg/dL (ref 0.5–20.0)

## 2017-05-16 LAB — SEDIMENTATION RATE: Sed Rate: 47 mm/hr — ABNORMAL HIGH (ref 0–30)

## 2017-05-16 MED ORDER — IOPAMIDOL (ISOVUE-370) INJECTION 76%
100.0000 mL | Freq: Once | INTRAVENOUS | Status: AC | PRN
  Administered 2017-05-16: 100 mL via INTRAVENOUS

## 2017-05-16 NOTE — Telephone Encounter (Signed)
Spoke with pt and informed her of her cologuard results. Pt gave a verbal understanding.  

## 2017-05-16 NOTE — Telephone Encounter (Signed)
FYI

## 2017-05-16 NOTE — Telephone Encounter (Signed)
The results of your cologuard test were negative.   This screening test for colon cancer can be repeated  every 3 years until you are 83 for colon CA screening, unless you develop change in bowel function that require colonoscopy for diagnostic purposess.   Regards,   Teresa Tullo, MD   

## 2017-05-16 NOTE — Telephone Encounter (Signed)
Lawanna Kobus, from [facility] calling with stat results of CT scan of abdomen. Impression was negative for acute findings.Results are in Epic and pt was sent home. Attempted to call [facility] to make aware of routed results but no answer at this time.

## 2017-05-16 NOTE — Patient Instructions (Signed)
Continue clear liquid diet until the CT scan rules out appendicitis and diverticulitis

## 2017-05-16 NOTE — Telephone Encounter (Signed)
See result note message 

## 2017-05-16 NOTE — Telephone Encounter (Signed)
Patient has been advised

## 2017-05-16 NOTE — Progress Notes (Signed)
Subjective:  Patient ID: [name], female    DOB: 10/18/1945  Age: 72 y.o. MRN: [id number]  CC: The primary encounter diagnosis was Acute right lower quadrant pain. Diagnoses of Colicky RLQ abdominal pain, Fatty liver, Screening for colon cancer, and Diverticulosis of colon without diverticulitis were also pertinent to this visit.  HPI [name] presents for EVALUATION OF RLQ  PAIN that started 4 days ago , the morning after eating an entire bag of microwave popcorn .  The pain became severe by Saturday NIGHT,   With subjective fevers and chills.  Stools were initially formed, then had 2 loose stools her usual IBS pattern)  Without  DYSURIA.  Some low back on the right side.  INCREASED  GAS. The pain improved yesterday, and today she notes that the pain is only present with palpation of rlq quadrant    LAST COLONOSCOPY was in 2006,  NO DIVERTICULOSIS NOTED on reports.  Marland Kitchen  HAS IBS DIARRHEA PREDOMINANT .  NO prior imaging by CT.    STILL HAS APPENDIX,  S/p TAH/BSO.   Outpatient Medications Prior to Visit  Medication Sig Dispense Refill  . albuterol (VENTOLIN HFA) 108 (90 Base) MCG/ACT inhaler Inhale 2 puffs into the lungs every 6 (six) hours as needed for wheezing or shortness of breath.    . ALPRAZolam (XANAX) 0.25 MG tablet Take 1 tablet (0.25 mg total) by mouth 2 (two) times daily as needed for anxiety. 20 tablet 0  . APPLE CIDER VINEGAR PO Take 3 capsules by mouth daily.    . Azelastine HCl 0.15 % SOLN U 1 TO 2 SPRAYS IEN QD  6  . b complex vitamins tablet Take 1 tablet by mouth daily.    . bimatoprost (LUMIGAN) 0.03 % ophthalmic solution Place 1 drop into both eyes at bedtime.    . budesonide (PULMICORT) 180 MCG/ACT inhaler Inhale 2 puffs into the lungs 2 (two) times daily.    . cetirizine (ZYRTEC) 10 MG tablet Take 10 mg by mouth daily.      Marland Kitchen desonide (DESOWEN) 0.05 % ointment Apply topically as needed.      . Diclofenac Sodium CR 100 MG 24 hr tablet Take 1 tablet (100 mg total) by  mouth daily. 30 tablet 1  . ergocalciferol (DRISDOL) 50000 units capsule Take 1 capsule (50,000 Units total) by mouth once a week. 12 capsule 0  . MILK THISTLE PO Take 3 capsules by mouth daily.    . Probiotic Product (PROBIOTIC COMPLEX ACIDOPHILUS PO) Take 1 capsule by mouth daily.    . sodium chloride (OCEAN) 0.65 % SOLN nasal spray Place 1 spray into the nose as needed.      Marland Kitchen telmisartan (MICARDIS) 20 MG tablet Take 1 tablet (20 mg total) by mouth daily. 30 tablet 5  . TURMERIC CURCUMIN PO Take 1 capsule by mouth 2 (two) times daily.      No facility-administered medications prior to visit.     Review of Systems;  Patient denies headache, fevers, malaise, unintentional weight loss, skin rash, eye pain, sinus congestion and sinus pain, sore throat, dysphagia,  hemoptysis , cough, dyspnea, wheezing, chest pain, palpitations, orthopnea, edema, abdominal pain, nausea, melena, diarrhea, constipation, flank pain, dysuria, hematuria, urinary  Frequency, nocturia, numbness, tingling, seizures,  Focal weakness, Loss of consciousness,  Tremor, insomnia, depression, anxiety, and suicidal ideation.      Objective:  BP 134/82 (BP Location: Left Arm, Patient Position: Sitting, Cuff Size: Large)   Pulse 79   Temp  98.3 F (36.8 C) (Oral)   Resp 15   Ht  (1.676 m)   Wt 218 lb (98.9 kg)   SpO2 97%   BMI 35.19 kg/m   BP Readings from Last 3 Encounters:  05/16/17 134/82  05/02/17 130/84  04/25/17 (!) 144/82    Wt Readings from Last 3 Encounters:  05/16/17 218 lb (98.9 kg)  04/25/17 220 lb 3.2 oz (99.9 kg)  03/19/17 220 lb (99.8 kg)    General appearance: alert, cooperative , well appearing and appears stated age Ears: normal TM's and external ear canals both ears Throat: lips, mucosa, and tongue normal; teeth and gums normal Neck: no adenopathy, no carotid bruit, supple, symmetrical, trachea midline and thyroid not enlarged, symmetric, no tenderness/mass/nodules Back: symmetric, no  curvature. ROM normal. No CVA tenderness. Lungs: clear to auscultation bilaterally Heart: regular rate and rhythm, S1, S2 normal, no murmur, click, rub or gallop Abdomen: soft, non-tender; bowel sounds normal; no masses,  no organomegaly. Tender to deep palpation in RLQ without rebound or guarding  Pulses: 2+ and symmetric Skin: Skin color, texture, turgor normal. No rashes or lesions Lymph nodes: Cervical, supraclavicular, and axillary nodes normal.  Lab Results  Component Value Date   HGBA1C 6.5 03/19/2017   HGBA1C 6.3 04/12/2015   HGBA1C 6.1 10/12/2014    Lab Results  Component Value Date   CREATININE 0.84 05/02/2017   CREATININE 0.78 03/19/2017   CREATININE 0.78 04/12/2015    Lab Results  Component Value Date   WBC 10.7 (H) 05/16/2017   HGB 14.3 05/16/2017   HCT 41.5 05/16/2017   PLT 321.0 05/16/2017   GLUCOSE 118 (H) 05/02/2017   CHOL 194 03/19/2017   TRIG 172.0 (H) 03/19/2017   HDL 53.30 03/19/2017   LDLDIRECT 113.0 10/12/2014   LDLCALC 106 (H) 03/19/2017   ALT 52 (H) 03/19/2017   AST 40 (H) 03/19/2017   NA 136 05/02/2017   K 4.2 05/02/2017   CL 102 05/02/2017   CREATININE 0.84 05/02/2017   BUN 11 05/02/2017   CO2 27 05/02/2017   TSH 3.89 04/18/2017   INR 1.1 07/27/2007   HGBA1C 6.5 03/19/2017   MICROALBUR 1.6 03/19/2017       Assessment & Plan:   Problem List Items Addressed This Visit    Screening for colon cancer    She has deferred colonoscopy unless cologuard is positive.  Colgoguard was negative       Fatty liver    Supported by CT done for evaluation of acute RLQ Pain. serologies negative for autoimmune causes of hepatitis.  Current liver enzymes are elevated and all modifiable risk factors were addressed with patient today including obesity, metabolic syndrome and hyperlipidemia .  Hep A/B vaccine series has been given.   Lab Results  Component Value Date   ALT 52 (H) 03/19/2017   AST 40 (H) 03/19/2017   ALKPHOS 75 03/19/2017   BILITOT  1.4 (H) 03/19/2017           Diverticulosis of colon without diverticulitis    Noted on STAT CT  Without inflammation.       Colicky RLQ abdominal pain    Steadily improving since Saturday night.  Labs suggest resolving infection/inflammation without diverticulitis, appendicitis or enteritis noted on STAT contrasted CT.  No indications for antibiotics.   Advance diet as tolerated. Diverticulosis noted on CT .  She is overdue for colonoscopy.  Lab Results  Component Value Date   WBC 10.7 (H) 05/16/2017   HGB 14.3  05/16/2017   HCT 41.5 05/16/2017   MCV 87.4 05/16/2017   PLT 321.0 05/16/2017   Lab Results  Component Value Date   ESRSEDRATE 47 (H) 05/16/2017   Lab Results  Component Value Date   CRP 1.5 05/16/2017          Other Visit Diagnoses    Acute right lower quadrant pain    -  Primary   Relevant Orders   CBC with Differential/Platelet (Completed)   Sedimentation rate (Completed)   C-reactive protein (Completed)   CT Abdomen Pelvis W Contrast (Completed)     A total of 40 minutes was spent with patient more than half of which was spent in counseling patient on the above mentioned issues , reviewing and explaining recent labs and imaging studies done, and coordination of care.  I am having [name] maintain her cetirizine, sodium chloride, desonide, Probiotic Product (PROBIOTIC COMPLEX ACIDOPHILUS PO), b complex vitamins, TURMERIC CURCUMIN PO, bimatoprost, ALPRAZolam, Azelastine HCl, albuterol, budesonide, MILK THISTLE PO, APPLE CIDER VINEGAR PO, telmisartan, Diclofenac Sodium CR, and ergocalciferol.  No orders of the defined types were placed in this encounter.   There are no discontinued medications.  Follow-up: No follow-ups on file.   Sherlene Shams, MD

## 2017-05-17 DIAGNOSIS — K5730 Diverticulosis of large intestine without perforation or abscess without bleeding: Secondary | ICD-10-CM | POA: Insufficient documentation

## 2017-05-17 DIAGNOSIS — R1031 Right lower quadrant pain: Secondary | ICD-10-CM | POA: Insufficient documentation

## 2017-05-17 NOTE — Assessment & Plan Note (Signed)
She has deferred colonoscopy unless cologuard is positive.  Colgoguard was negative

## 2017-05-17 NOTE — Assessment & Plan Note (Signed)
Noted on STAT CT  Without inflammation.

## 2017-05-17 NOTE — Assessment & Plan Note (Addendum)
Steadily improving since Saturday night.  Labs suggest resolving infection/inflammation without diverticulitis, appendicitis or enteritis noted on STAT contrasted CT.  No indications for antibiotics.   Advance diet as tolerated. Diverticulosis noted on CT .  She is overdue for colonoscopy.  Lab Results  Component Value Date   WBC 10.7 (H) 05/16/2017   HGB 14.3 05/16/2017   HCT 41.5 05/16/2017   MCV 87.4 05/16/2017   PLT 321.0 05/16/2017   Lab Results  Component Value Date   ESRSEDRATE 47 (H) 05/16/2017   Lab Results  Component Value Date   CRP 1.5 05/16/2017

## 2017-05-17 NOTE — Assessment & Plan Note (Signed)
Supported by CT done for evaluation of acute RLQ Pain. serologies negative for autoimmune causes of hepatitis.  Current liver enzymes are elevated and all modifiable risk factors were addressed with patient today including obesity, metabolic syndrome and hyperlipidemia .  Hep A/B vaccine series has been given.   Lab Results  Component Value Date   ALT 52 (H) 03/19/2017   AST 40 (H) 03/19/2017   ALKPHOS 75 03/19/2017   BILITOT 1.4 (H) 03/19/2017

## 2017-08-14 ENCOUNTER — Other Ambulatory Visit: Payer: Self-pay | Admitting: Internal Medicine

## 2017-08-15 ENCOUNTER — Other Ambulatory Visit: Payer: Self-pay | Admitting: Internal Medicine

## 2017-09-24 ENCOUNTER — Ambulatory Visit (INDEPENDENT_AMBULATORY_CARE_PROVIDER_SITE_OTHER): Payer: Medicare Other | Admitting: Internal Medicine

## 2017-09-24 ENCOUNTER — Telehealth: Payer: Self-pay | Admitting: Internal Medicine

## 2017-09-24 ENCOUNTER — Encounter: Payer: Self-pay | Admitting: Internal Medicine

## 2017-09-24 VITALS — BP 136/76 | HR 69 | Temp 98.0°F | Resp 15 | Ht 66.0 in | Wt 223.0 lb

## 2017-09-24 DIAGNOSIS — K760 Fatty (change of) liver, not elsewhere classified: Secondary | ICD-10-CM

## 2017-09-24 DIAGNOSIS — Z0000 Encounter for general adult medical examination without abnormal findings: Secondary | ICD-10-CM

## 2017-09-24 DIAGNOSIS — E1149 Type 2 diabetes mellitus with other diabetic neurological complication: Secondary | ICD-10-CM

## 2017-09-24 DIAGNOSIS — E559 Vitamin D deficiency, unspecified: Secondary | ICD-10-CM | POA: Diagnosis not present

## 2017-09-24 DIAGNOSIS — I10 Essential (primary) hypertension: Secondary | ICD-10-CM

## 2017-09-24 DIAGNOSIS — L409 Psoriasis, unspecified: Secondary | ICD-10-CM

## 2017-09-24 DIAGNOSIS — Z23 Encounter for immunization: Secondary | ICD-10-CM | POA: Diagnosis not present

## 2017-09-24 DIAGNOSIS — I739 Peripheral vascular disease, unspecified: Secondary | ICD-10-CM

## 2017-09-24 DIAGNOSIS — G4733 Obstructive sleep apnea (adult) (pediatric): Secondary | ICD-10-CM

## 2017-09-24 DIAGNOSIS — E538 Deficiency of other specified B group vitamins: Secondary | ICD-10-CM

## 2017-09-24 DIAGNOSIS — E785 Hyperlipidemia, unspecified: Secondary | ICD-10-CM

## 2017-09-24 DIAGNOSIS — Z9989 Dependence on other enabling machines and devices: Secondary | ICD-10-CM

## 2017-09-24 LAB — LIPID PANEL
Cholesterol: 180 mg/dL (ref 0–200)
HDL: 47.6 mg/dL (ref >39.00)
LDL Cholesterol: 93 mg/dL (ref 0–99)
NonHDL: 132.77
Total CHOL/HDL Ratio: 4
Triglycerides: 198 mg/dL — ABNORMAL HIGH (ref 0.0–149.0)
VLDL: 39.6 mg/dL (ref 0.0–40.0)

## 2017-09-24 LAB — VITAMIN D 25 HYDROXY (VIT D DEFICIENCY, FRACTURES): VITD: 24.13 ng/mL — ABNORMAL LOW (ref 30.00–100.00)

## 2017-09-24 MED ORDER — ALPRAZOLAM 0.25 MG PO TABS: 0.2500 mg | ORAL_TABLET | Freq: Two times a day (BID) | ORAL | 0 refills | Status: DC | PRN

## 2017-09-24 NOTE — Progress Notes (Signed)
Subjective:  Patient ID: [name], female    DOB: 1945/04/23  Age: 72 y.o. MRN: [id number]  CC: The primary encounter diagnosis was Need for influenza vaccination. Diagnoses of OSA on CPAP, Fatty liver, Vitamin D deficiency, Type 2 diabetes mellitus with neurological complications (HCC), B12 deficiency, Hyperlipidemia LDL goal <130, Essential hypertension, Psoriasis, PAD (peripheral artery disease) (HCC), and Normal cardiac exam were also pertinent to this visit.  HPI [name] presents for follow up on multiple issues incluing obesity, fatty liver, diet controlled type 2 Diabetes mellitus, and  OSA.  In early August she participated in [place] screening and has brought the results of the screening with her today for review with me  ABI's noted slight decrease in right  leg 0.85.  She denies  claudication symptoms   Obesity addressed  Fatty liver:  She has been taking Milk thistle since her last liver enzyme test .  She continues to avoid particpation oin exercise  And admits to   Dietary noncompliance often.  She has been eating cake nearly daily due to family birthdays and patient'ss love of baking.   HTN:  .Hypertension: patient checks blood pressure twice weekly at home. Elevated bp readings in the evenings only. Taking telmisartan in the morning . takes diclofenac as needed ,  Abut 2/week.  Has not correlated use of NSAID with BP measurements,  Uses a lot of salt in preparation of  dinner meals.   OSA:  She has been diagnosed with prior sleep study  And uses a machine she purchased  1-2 years ago.   She is wearing her CPAP every night a minimum of 6 hours per night and notes improved daytime wakefulness and decreased fatigue .  She requires a letter of authorization for supplies.   She has right leg pain in the posterior and lateral thigh region  that is brought on by getting up in the morning and improves after walking around a few minutes. .    Vit d deficiency . Now taking 3000  units daily.   Taking sublingual B12   Continues to suffer from active Psoriasis outbreak after her shingles vaccine . Has not seen dermatology.  The psoriasis is  affecting skin over buttocks     Outpatient Medications Prior to Visit  Medication Sig Dispense Refill  . albuterol (VENTOLIN HFA) 108 (90 Base) MCG/ACT inhaler Inhale 2 puffs into the lungs every 6 (six) hours as needed for wheezing or shortness of breath.    . APPLE CIDER VINEGAR PO Take 3 capsules by mouth daily.    . Azelastine HCl 0.15 % SOLN U 1 TO 2 SPRAYS IEN QD  6  . b complex vitamins tablet Take 1 tablet by mouth daily.    . bimatoprost (LUMIGAN) 0.03 % ophthalmic solution Place 1 drop into both eyes at bedtime.    . budesonide (PULMICORT) 180 MCG/ACT inhaler Inhale 2 puffs into the lungs 2 (two) times daily.    . cetirizine (ZYRTEC) 10 MG tablet Take 10 mg by mouth daily.      Marland Kitchen desonide (DESOWEN) 0.05 % ointment Apply topically as needed.      . Diclofenac Sodium CR 100 MG 24 hr tablet TAKE 1 TABLET(100 MG) BY MOUTH DAILY 30 tablet 1  . ergocalciferol (DRISDOL) 50000 units capsule Take 1 capsule (50,000 Units total) by mouth once a week. 12 capsule 0  . LUMIGAN 0.01 % SOLN INSTILL 1 DROP INTO BOTH EYES QHS  3  . MILK THISTLE  PO Take 3 capsules by mouth daily.    . Probiotic Product (PROBIOTIC COMPLEX ACIDOPHILUS PO) Take 1 capsule by mouth daily.    . sodium chloride (OCEAN) 0.65 % SOLN nasal spray Place 1 spray into the nose as needed.      Marland Kitchen telmisartan (MICARDIS) 20 MG tablet Take 1 tablet (20 mg total) by mouth daily. 30 tablet 5  . TURMERIC CURCUMIN PO Take 1 capsule by mouth 2 (two) times daily.     Marland Kitchen ALPRAZolam (XANAX) 0.25 MG tablet Take 1 tablet (0.25 mg total) by mouth 2 (two) times daily as needed for anxiety. 20 tablet 0   No facility-administered medications prior to visit.     Review of Systems;  Patient denies headache, fevers, malaise, unintentional weight loss, , eye pain, sinus congestion and  sinus pain, sore throat, dysphagia,  hemoptysis , cough, dyspnea, wheezing, chest pain, palpitations, orthopnea, edema, abdominal pain, nausea, melena, diarrhea, constipation, flank pain, dysuria, hematuria, urinary  Frequency, nocturia, numbness, tingling, seizures,  Focal weakness, Loss of consciousness,  Tremor, insomnia, depression, anxiety, and suicidal ideation.      Objective:  BP 136/76 (BP Location: Left Arm, Patient Position: Sitting, Cuff Size: Large)   Pulse 69   Temp 98 F (36.7 C) (Oral)   Resp 15   Ht 5\' 6"  (1.676 m)   Wt 223 lb (101.2 kg)   SpO2 96%   BMI 35.99 kg/m   BP Readings from Last 3 Encounters:  09/24/17 136/76  05/16/17 134/82  05/02/17 130/84    Wt Readings from Last 3 Encounters:  09/24/17 223 lb (101.2 kg)  05/16/17 218 lb (98.9 kg)  04/25/17 220 lb 3.2 oz (99.9 kg)    General appearance: alert, cooperative and appears stated age Ears: normal TM's and external ear canals both ears Throat: lips, mucosa, and tongue normal; teeth and gums normal Neck: no adenopathy, no carotid bruit, supple, symmetrical, trachea midline and thyroid not enlarged, symmetric, no tenderness/mass/nodules Back: symmetric, no curvature. ROM normal. No CVA tenderness. Lungs: clear to auscultation bilaterally Heart: regular rate and rhythm, S1, S2 normal, no murmur, click, rub or gallop Abdomen: soft, non-tender; bowel sounds normal; no masses,  no organomegaly Pulses: 2+ and symmetric Skin: Skin color, texture, turgor normal. No rashes or lesions Lymph nodes: Cervical, supraclavicular, and axillary nodes normal.  Lab Results  Component Value Date   HGBA1C 6.5 03/19/2017   HGBA1C 6.3 04/12/2015   HGBA1C 6.1 10/12/2014    Lab Results  Component Value Date   CREATININE 0.84 05/02/2017   CREATININE 0.78 03/19/2017   CREATININE 0.78 04/12/2015    Lab Results  Component Value Date   WBC 10.7 (H) 05/16/2017   HGB 14.3 05/16/2017   HCT 41.5 05/16/2017   PLT 321.0  05/16/2017   GLUCOSE 118 (H) 05/02/2017   CHOL 180 09/24/2017   TRIG 198.0 (H) 09/24/2017   HDL 47.60 09/24/2017   LDLDIRECT 113.0 10/12/2014   LDLCALC 93 09/24/2017   ALT 52 (H) 03/19/2017   AST 40 (H) 03/19/2017   NA 136 05/02/2017   K 4.2 05/02/2017   CL 102 05/02/2017   CREATININE 0.84 05/02/2017   BUN 11 05/02/2017   CO2 27 05/02/2017   TSH 3.89 04/18/2017   INR 1.1 07/27/2007   HGBA1C 6.5 03/19/2017   MICROALBUR 1.6 03/19/2017    Ct Abdomen Pelvis W Contrast  Result Date: 05/16/2017 CLINICAL DATA:  Right lower quadrant abdominal pain for 7 days with fever. EXAM: CT ABDOMEN AND PELVIS  WITH CONTRAST TECHNIQUE: Multidetector CT imaging of the abdomen and pelvis was performed using the standard protocol following bolus administration of intravenous contrast. CONTRAST:  ISOVUE-370 IOPAMIDOL (ISOVUE-370) INJECTION 76% COMPARISON:  CT scan 09/25/2007 FINDINGS: Lower chest: The lung bases are clear of acute process. No pleural effusion or pulmonary lesions. The heart is normal in size. No pericardial effusion. The distal esophagus and aorta are unremarkable. Hepatobiliary: Diffuse and fairly marked fatty infiltration of the liver but no focal hepatic lesions or intrahepatic biliary dilatation. Simple right hepatic lobe cyst at the dome is stable. The gallbladder is unremarkable. No common bile duct dilatation. Pancreas: No mass, inflammation or ductal dilatation. Spleen: Normal size.  No focal lesions. Adrenals/Urinary Tract: The adrenal glands and kidneys are unremarkable. No acute inflammatory findings, mass lesions or evidence of ureteral obstruction. No renal or bladder calculi. Stomach/Bowel: The stomach, duodenum, small bowel and colon are unremarkable. No acute inflammatory changes, mass lesions or obstructive findings. Diffuse colonic diverticulosis without findings for acute diverticulitis. The terminal ileum is normal. The appendix is normal. Vascular/Lymphatic: Scattered  atherosclerotic calcifications involving the aorta. The branch vessels are patent. The major venous structures are patent. Small scattered mesenteric and retroperitoneal lymph nodes but no mass or adenopathy. No pelvic adenopathy. Small scattered pelvic lymph nodes are noted. Reproductive: Surgically absent. Other: No pelvic mass or adenopathy. No free pelvic fluid collections. No inguinal mass or adenopathy. No abdominal wall hernia or subcutaneous lesions. Musculoskeletal: No significant bony findings. IMPRESSION: 1. No acute abdominal/pelvic findings, mass lesions or lymphadenopathy. 2. Diffuse and fairly marked fatty infiltration of the liver. 3. Colonic diverticulosis without findings for acute diverticulitis. 4. Status post hysterectomy. 5. Small scattered abdominal and pelvic lymph nodes but no mass or overt adenopathy. Electronically Signed   By: Rudie Meyer M.D.   On: 05/16/2017 14:08    Assessment & Plan:   Problem List Items Addressed This Visit    B12 deficiency    Managed with sublingual supplements.  Lab Results  Component Value Date   VITAMINB12 331 03/19/2017         Fatty liver    Supported by CT  serologies negative for autoimmune causes of hepatitis.  Last liver enzymes were  elevated and all modifiable risk factors were addressed again with patient today including obesity, metabolic syndrome and hyperlipidemia .  Hep A/B vaccine series has been given. She has bben taking mild thistle and will return for fasting lipids and lfts.   Lab Results  Component Value Date   ALT 52 (H) 03/19/2017   AST 40 (H) 03/19/2017   ALKPHOS 75 03/19/2017   BILITOT 1.4 (H) 03/19/2017           Relevant Orders   Comprehensive metabolic panel   Lipid panel (Completed)   Hyperlipidemia LDL goal <130    10 YR RISK  Using FRC is 23%  She has historically deferred statin therapy, despite a history of diet controlled diabetes and recent carotid artery screenings which suggest nonocclusive  PAD.   Given the PAD noted on the screenings,  Will recommend statin but not asa  Lab Results  Component Value Date   CHOL 180 09/24/2017   HDL 47.60 09/24/2017   LDLCALC 93 09/24/2017   LDLDIRECT 113.0 10/12/2014   TRIG 198.0 (H) 09/24/2017   CHOLHDL 4 09/24/2017   Lab Results  Component Value Date   ALT 52 (H) 03/19/2017   AST 40 (H) 03/19/2017   ALKPHOS 75 03/19/2017   BILITOT 1.4 (  H) 03/19/2017           Hypertension    Elevated  In the afternoons based on home records.   Reviewed list of meds, patient is  taking NSAIDs 2/week and has this may be causing the elevations. Have asked patient to recheck bp at home a minimum of 5 times over the next 4 weeks and correlate with use of diclofenac.  Also advised to reduce the use of salt in her diet.        Normal cardiac exam   OSA on CPAP   Relevant Orders   DME Other see comment   PAD (peripheral artery disease) (HCC)    Noted on carotid ultrasounds and on ABIs done during [facility]  ABI on right leg was 0.85  Stain advised       Psoriasis    Dermatology referral offered but deferred again for persistent active inflammation of skin covering her buttocks and lower back.       Type 2 diabetes mellitus with neurological complications (HCC)   Relevant Orders   Hemoglobin A1c   Vitamin D deficiency   Relevant Orders   VITAMIN D 25 Hydroxy (Vit-D Deficiency, Fractures) (Completed)    Other Visit Diagnoses    Need for influenza vaccination    -  Primary   Relevant Orders   Flu vaccine HIGH DOSE PF (Fluzone High dose) (Completed)    A total of 40 minutes was spent with patient more than half of which was spent in counseling patient on the above mentioned issues , reviewing and explaining recent labs and imaging studies done, and coordination of care.  I am having [name] maintain her cetirizine, sodium chloride, desonide, Probiotic Product (PROBIOTIC COMPLEX ACIDOPHILUS PO), b complex vitamins, TURMERIC  CURCUMIN PO, bimatoprost, Azelastine HCl, albuterol, budesonide, MILK THISTLE PO, APPLE CIDER VINEGAR PO, telmisartan, ergocalciferol, Diclofenac Sodium CR, LUMIGAN, and ALPRAZolam.  Meds ordered this encounter  Medications  . ALPRAZolam (XANAX) 0.25 MG tablet    Sig: Take 1 tablet (0.25 mg total) by mouth 2 (two) times daily as needed for anxiety.    Dispense:  30 tablet    Refill:  0    Medications Discontinued During This Encounter  Medication Reason  . ALPRAZolam (XANAX) 0.25 MG tablet Reorder    Follow-up: Return in about 6 months (around 03/25/2018).   Sherlene Shams, MD

## 2017-09-24 NOTE — Telephone Encounter (Signed)
can she return for a fingerstick a1c at her leisure?

## 2017-09-24 NOTE — Patient Instructions (Addendum)
Reduce the salt in your evening meals  ,  To lower blood pressure   Check to see if the elevated readings are on the ay you take diclofenac  You can increase your zyrtec   To morning and  evening   I recommend you see Titus Dubin  MD for your psoriasis  [facility] on  [address]

## 2017-09-25 ENCOUNTER — Other Ambulatory Visit (INDEPENDENT_AMBULATORY_CARE_PROVIDER_SITE_OTHER): Payer: Medicare Other

## 2017-09-25 DIAGNOSIS — K760 Fatty (change of) liver, not elsewhere classified: Secondary | ICD-10-CM | POA: Diagnosis not present

## 2017-09-25 DIAGNOSIS — Z0000 Encounter for general adult medical examination without abnormal findings: Secondary | ICD-10-CM | POA: Insufficient documentation

## 2017-09-25 LAB — COMPREHENSIVE METABOLIC PANEL
ALT: 45 U/L — ABNORMAL HIGH (ref 0–35)
AST: 40 U/L — ABNORMAL HIGH (ref 0–37)
Albumin: 4.1 g/dL (ref 3.5–5.2)
Alkaline Phosphatase: 64 U/L (ref 39–117)
BUN: 14 mg/dL (ref 6–23)
CO2: 27 mEq/L (ref 19–32)
Calcium: 9.4 mg/dL (ref 8.4–10.5)
Chloride: 102 mEq/L (ref 96–112)
Creatinine, Ser: 0.77 mg/dL (ref 0.40–1.20)
GFR: 78.1 mL/min (ref >60.00)
Glucose, Bld: 97 mg/dL (ref 70–99)
Potassium: 4.6 mEq/L (ref 3.5–5.1)
Sodium: 140 mEq/L (ref 135–145)
Total Bilirubin: 1.2 mg/dL (ref 0.2–1.2)
Total Protein: 7.5 g/dL (ref 6.0–8.3)

## 2017-09-25 NOTE — Assessment & Plan Note (Signed)
Supported by CT  serologies negative for autoimmune causes of hepatitis.  Last liver enzymes were  elevated and all modifiable risk factors were addressed again with patient today including obesity, metabolic syndrome and hyperlipidemia .  Hep A/B vaccine series has been given. She has bben taking mild thistle and will return for fasting lipids and lfts.   Lab Results  Component Value Date   ALT 52 (H) 03/19/2017   AST 40 (H) 03/19/2017   ALKPHOS 75 03/19/2017   BILITOT 1.4 (H) 03/19/2017

## 2017-09-25 NOTE — Assessment & Plan Note (Signed)
Noted on carotid ultrasounds and on ABIs done during [facility]  ABI on right leg was 0.85  Stain advised

## 2017-09-25 NOTE — Assessment & Plan Note (Signed)
Dermatology referral offered but deferred again for persistent active inflammation of skin covering her buttocks and lower back.

## 2017-09-25 NOTE — Assessment & Plan Note (Addendum)
Elevated  In the afternoons based on home records.   Reviewed list of meds, patient is  taking NSAIDs 2/week and has this may be causing the elevations. Have asked patient to recheck bp at home a minimum of 5 times over the next 4 weeks and correlate with use of diclofenac.  Also advised to reduce the use of salt in her diet.

## 2017-09-25 NOTE — Assessment & Plan Note (Addendum)
10 YR RISK  Using FRC is 23%  She has historically deferred statin therapy, despite a history of diet controlled diabetes and recent carotid artery screenings which suggest nonocclusive PAD.   Given the PAD noted on the screenings,  Will recommend statin but not asa  Lab Results  Component Value Date   CHOL 180 09/24/2017   HDL 47.60 09/24/2017   LDLCALC 93 09/24/2017   LDLDIRECT 113.0 10/12/2014   TRIG 198.0 (H) 09/24/2017   CHOLHDL 4 09/24/2017   Lab Results  Component Value Date   ALT 52 (H) 03/19/2017   AST 40 (H) 03/19/2017   ALKPHOS 75 03/19/2017   BILITOT 1.4 (H) 03/19/2017

## 2017-09-25 NOTE — Assessment & Plan Note (Signed)
Managed with sublingual supplements.  Lab Results  Component Value Date   VITAMINB12 331 03/19/2017

## 2017-09-26 NOTE — Addendum Note (Signed)
Addended by: Warden Fillers on: 09/26/2017 10:54 AM   Modules accepted: Orders

## 2017-09-27 ENCOUNTER — Other Ambulatory Visit: Payer: Self-pay | Admitting: Internal Medicine

## 2017-09-27 DIAGNOSIS — Z1231 Encounter for screening mammogram for malignant neoplasm of breast: Secondary | ICD-10-CM

## 2017-09-27 NOTE — Telephone Encounter (Signed)
Called pt and she stated that she would like to wait until she comes in for her 3 month fasting lab appt because she takes care of grandchildren during the day.

## 2017-10-20 ENCOUNTER — Other Ambulatory Visit: Payer: Self-pay | Admitting: Internal Medicine

## 2017-10-23 ENCOUNTER — Other Ambulatory Visit: Payer: Self-pay

## 2017-10-23 MED ORDER — TELMISARTAN 20 MG PO TABS: 20.0000 mg | ORAL_TABLET | Freq: Every day | ORAL | 1 refills | Status: DC

## 2017-12-12 ENCOUNTER — Inpatient Hospital Stay: Payer: Medicare Other

## 2017-12-12 ENCOUNTER — Encounter: Payer: Self-pay | Admitting: Hematology and Oncology

## 2017-12-12 ENCOUNTER — Inpatient Hospital Stay: Payer: Medicare Other | Attending: Hematology and Oncology | Admitting: Hematology and Oncology

## 2017-12-12 ENCOUNTER — Other Ambulatory Visit: Payer: Self-pay

## 2017-12-12 VITALS — BP 125/76 | HR 76 | Temp 96.9°F | Wt 220.0 lb

## 2017-12-12 DIAGNOSIS — L409 Psoriasis, unspecified: Secondary | ICD-10-CM

## 2017-12-12 DIAGNOSIS — E119 Type 2 diabetes mellitus without complications: Secondary | ICD-10-CM | POA: Insufficient documentation

## 2017-12-12 DIAGNOSIS — I10 Essential (primary) hypertension: Secondary | ICD-10-CM | POA: Diagnosis not present

## 2017-12-12 DIAGNOSIS — Z87891 Personal history of nicotine dependence: Secondary | ICD-10-CM

## 2017-12-12 MED ORDER — TILDRAKIZUMAB-ASMN 100 MG/ML ~~LOC~~ SOSY
100.0000 mg | PREFILLED_SYRINGE | Freq: Once | SUBCUTANEOUS | Status: DC
  Filled 2017-12-12: qty 1

## 2017-12-12 MED ORDER — TILDRAKIZUMAB-ASMN 100 MG/ML ~~LOC~~ SOSY
100.0000 mg | PREFILLED_SYRINGE | Freq: Once | SUBCUTANEOUS | Status: AC
  Administered 2017-12-12: 100 mg via SUBCUTANEOUS
  Filled 2017-12-12: qty 1

## 2017-12-12 NOTE — Progress Notes (Signed)
[facility]-  Cancer Center  Clinic day:  12/12/2017  Chief Complaint: [name] is a 72 y.o. female with psoriasis who is referred by Dr. Lucy AntiguaArin Isenstein for Ilumya (tildrakizumab).  HPI: The patient has had psoriasis since the late 1980s.  She states that it started on her scalp.  She describes more difficulties over the past year following a shingles shot.   She states that she has been treated with topical steroids including desonide, fluocinonide, and Olux (clobetasol) for her scalp.   She is up-to-date on her vaccines.  She had hepatitis A and B vaccines.  She had her flu shot.  [facility] quantiferon gold testing for TB was negative.  She was seen at [facility] on 10/24/2017.  She was noted to have extensive psoriasis that was painful, itchy, red, and severe in nature.  Topical steroids were discussed.  She was counseled regarding the risks of tildrakizumab, including but not limited to immunosuppression, malignancy, posterior leukoencephalopathy syndrome, and serious infections.  She was counseled about alerting her physician if there were symptoms of infection or other concerning symptoms.  Symptomatically, she notes "no energy".  She is taking vitamin D supplements.  She denies any B symptoms (fevers, sweats, weight loss). She has a chronic dry cough from asthma.  She denies reflux.  She has sleep apnea.  Bowels fluctuate (diarrhea and constipation).   Past Medical History:  Diagnosis Date  . Asthma   . Asthma due to environmental allergies    grass, mold, trees, dust  . Cataract   . Cellulitis    LEFT LEG  . Diabetes mellitus    CONTROLLED ON DIET ALONE  . Edema leg    LEFT LEG...CHRONIC  . Endometrial hyperplasia   . External hemorrhoid   . Fatty liver   . Glaucoma   . Hepatic cyst    STABLE PER 05/20/2008 ULTRASOUND  . Hypertension    borderline...controlled since taking herself of HCTZ [place]  . IBS (irritable bowel syndrome)   .  Murmur, cardiac   . Neuropathy   . OSA on CPAP   . Osteoarthritis   . Psoriasis   . Sleep apnea    CPAP    Past Surgical History:  Procedure Laterality Date  . BONE DENSITY TEST  2006  . HYSTEROSCOPY W/D&C  2007   BC OF ABNORAL PAP AND ENDOMETRIAL HYPERPLASIA  . ONE VAGINAL DELIVERY    . UMBILICAL HERNIA REPAIR  2010  . VAGINAL HYSTERECTOMY  2010    Family History  Problem Relation Age of Onset  . Mental illness Mother        DEMENTIA  . Heart disease Father [id number]       MI.Marland Kitchen.Marland Kitchen.S/D AVR/CABG x3  . Cancer Neg Hx        no ovarian colon breast  . Breast cancer Neg Hx     Social History:  reports that she quit smoking about 40 years ago. Her smoking use included cigarettes. She has never used smokeless tobacco. She reports that she does not drink alcohol or use drugs.  She previously smoked 1/4-1/3 ppd x 4 years.  She stopped smoking in 1980.  She denies any exposure to radiation or toxins.  She is retired from [employer].  The patient is accompanied by her husband today.  Allergies:  Allergies  Allergen Reactions  . Sulfa Drugs Cross Reactors     Current Medications: Current Outpatient Medications  Medication Sig Dispense Refill  . albuterol (VENTOLIN  HFA) 108 (90 Base) MCG/ACT inhaler Inhale 2 puffs into the lungs every 6 (six) hours as needed for wheezing or shortness of breath.    . ALPRAZolam (XANAX) 0.25 MG tablet Take 1 tablet (0.25 mg total) by mouth 2 (two) times daily as needed for anxiety. 30 tablet 0  . APPLE CIDER VINEGAR PO Take 3 capsules by mouth daily.    . Azelastine HCl 0.15 % SOLN U 1 TO 2 SPRAYS IEN QD  6  . b complex vitamins tablet Take 1 tablet by mouth daily.    . bimatoprost (LUMIGAN) 0.03 % ophthalmic solution Place 1 drop into both eyes at bedtime.    Vedia Coffer Pepper-Turmeric (TURMERIC CURCUMIN) 05-998 MG CAPS Take by mouth.    . budesonide (PULMICORT) 180 MCG/ACT inhaler Inhale 2 puffs into the lungs 2 (two) times daily.    . cetirizine  (ZYRTEC) 10 MG tablet Take 10 mg by mouth daily.      . Coenzyme Q10 (CO Q10) 200 MG CAPS Take 1 tablet by mouth.    . desonide (DESOWEN) 0.05 % ointment Apply topically as needed.      . Ginkgo Biloba 60 MG CAPS Take 1 capsule by mouth.    . LUMIGAN 0.01 % SOLN INSTILL 1 DROP INTO BOTH EYES QHS  3  . MILK THISTLE PO Take 3 capsules by mouth daily.    . Probiotic Product (PROBIOTIC COMPLEX ACIDOPHILUS PO) Take 1 capsule by mouth daily.    . sodium chloride (OCEAN) 0.65 % SOLN nasal spray Place 1 spray into the nose as needed.      Marland Kitchen telmisartan (MICARDIS) 20 MG tablet Take 1 tablet (20 mg total) by mouth daily. 90 tablet 1  . triamcinolone cream (KENALOG) 0.1 % APPLY TO THE AFFECTED AREA OF RIGH LEG TWICE DAILY UNTIL CLEAR  1  . TURMERIC CURCUMIN PO Take 1 capsule by mouth 2 (two) times daily.     . Diclofenac Sodium CR 100 MG 24 hr tablet TAKE 1 TABLET(100 MG) BY MOUTH DAILY 30 tablet 1   No current facility-administered medications for this visit.     Review of Systems:  GENERAL:  No energy.  No fevers, sweats or weight loss. PERFORMANCE STATUS (ECOG):  1 HEENT:  No visual changes, runny nose, sore throat, mouth sores or tenderness. Lungs: No shortness of breath.  Chronic dry cough due to asthma.  No hemoptysis.  Sleep apnea- uses CPAP. Cardiac:  No chest pain, palpitations, orthopnea, or PND. GI:  No nausea, vomiting, diarrhea, constipation, melena or hematochezia. GU:  Irritable bowel (alternating diarrhea and constipation).  No urgency, frequency, dysuria, or hematuria. Musculoskeletal:  Osteoarthritis in feet.  No back pain.  No muscle tenderness. Extremities:  No pain or swelling. Skin:  No rashes or skin changes. Neuro:  No headache, numbness or weakness, balance or coordination issues. Endocrine:  No diabetes, thyroid issues, hot flashes or night sweats. Psych:  No mood changes, depression or anxiety. Pain:  No focal pain. Review of systems:  All other systems reviewed and  found to be negative.  Physical Exam: Blood pressure 125/76, pulse 76, temperature (!) 96.9 F (36.1 C), temperature source Tympanic, weight 220 lb (99.8 kg). GENERAL:  Well developed, well nourished, woman sitting comfortably in the exam room in no acute distress. MENTAL STATUS:  Alert and oriented to person, place and time. HEAD:  Short hair.  Normocephalic, atraumatic, face symmetric, no Cushingoid features. EYES:  Glasses.  Pupils equal round and reactive to  light and accomodation.  No conjunctivitis or scleral icterus. ENT:  Oropharynx clear without lesion.  Tongue normal. Mucous membranes moist.  RESPIRATORY:  Clear to auscultation without rales, wheezes or rhonchi. CARDIOVASCULAR:  Regular rate and rhythm without murmur, rub or gallop. ABDOMEN:  Soft, non-tender, with active bowel sounds, and no hepatosplenomegaly.  No masses. SKIN:  Psoriasis.  Nailbed changes.  No rashes, ulcers or lesions. EXTREMITIES: No edema, no skin discoloration or tenderness.  No palpable cords. LYMPH NODES: No palpable cervical, supraclavicular, axillary or inguinal adenopathy  NEUROLOGICAL: Unremarkable. PSYCH:  Appropriate.   No visits with results within 3 Day(s) from this visit.  Latest known visit with results is:  Appointment on 09/25/2017  Component Date Value Ref Range Status  . Sodium 09/25/2017 140  135 - 145 mEq/L Final  . Potassium 09/25/2017 4.6  3.5 - 5.1 mEq/L Final  . Chloride 09/25/2017 102  96 - 112 mEq/L Final  . CO2 09/25/2017 27  19 - 32 mEq/L Final  . Glucose, Bld 09/25/2017 97  70 - 99 mg/dL Final  . BUN 16/10/9602 14  6 - 23 mg/dL Final  . Creatinine, Ser 09/25/2017 0.77  0.40 - 1.20 mg/dL Final  . Total Bilirubin 09/25/2017 1.2  0.2 - 1.2 mg/dL Final  . Alkaline Phosphatase 09/25/2017 64  39 - 117 U/L Final  . AST 09/25/2017 40* 0 - 37 U/L Final  . ALT 09/25/2017 45* 0 - 35 U/L Final  . Total Protein 09/25/2017 7.5  6.0 - 8.3 g/dL Final  . Albumin 54/09/8117 4.1  3.5 - 5.2  g/dL Final  . Calcium 14/78/2956 9.4  8.4 - 10.5 mg/dL Final  . GFR 21/30/8657 78.10  >60.00 mL/min Final    Assessment:  [name] is a 72 y.o. female with psoriasis.  She has been treated with topical steroids including desonide, fluocinonide, and Olux (clobetasol) for her scalp.  Quantiferon gold testing was negative.  She is up-to-date on her vaccines.  Symptomatically, she has had more difficulties with her psoriasis this year.  She is fatigued.  She denies any B symptoms.  Plan: 1.  Psoriasis:  Discuss monoclonal antibody/interleukin-23 inhibitor.  Discuss potential adverse reactions including hypersensitivity reactions and infections.  Conform patient evaluated for TB prior to initiating therapy- done.  Ensure immunizations up-to-date. 2.  Begin Ilumya (tildrakizumab) SubQ: 100 mg at weeks 0, 4, and then every 12 weeks thereafter. 3.  Follow-up as scheduled with Dr.  Lucy Antigua. 4.  RTC for MD assessment with week 16 injection.   Rosey Bath, MD  12/12/2017, 4:35 PM

## 2017-12-12 NOTE — Patient Instructions (Signed)
Ilumya (tildrakizumab-asmn) 100 mg injection

## 2017-12-17 ENCOUNTER — Ambulatory Visit
Admission: RE | Admit: 2017-12-17 | Discharge: 2017-12-17 | Disposition: A | Discharge status: Home / Self Care | Payer: Medicare Other | Source: Ambulatory Visit | Attending: Internal Medicine | Admitting: Internal Medicine

## 2017-12-17 DIAGNOSIS — Z1231 Encounter for screening mammogram for malignant neoplasm of breast: Secondary | ICD-10-CM

## 2017-12-26 ENCOUNTER — Telehealth: Payer: Self-pay | Admitting: Radiology

## 2017-12-26 ENCOUNTER — Other Ambulatory Visit (INDEPENDENT_AMBULATORY_CARE_PROVIDER_SITE_OTHER): Payer: Medicare Other

## 2017-12-26 DIAGNOSIS — Z8739 Personal history of other diseases of the musculoskeletal system and connective tissue: Secondary | ICD-10-CM

## 2017-12-26 DIAGNOSIS — E034 Atrophy of thyroid (acquired): Secondary | ICD-10-CM | POA: Diagnosis not present

## 2017-12-26 LAB — TSH: TSH: 5.65 u[IU]/mL — ABNORMAL HIGH (ref 0.35–4.50)

## 2017-12-26 LAB — URIC ACID: Uric Acid, Serum: 6 mg/dL (ref 2.4–7.0)

## 2017-12-26 NOTE — Telephone Encounter (Signed)
Pt came in for labs. After drawing pt and placing paper tape on pt arm, pt asked what size needle was used. I told pt that a butterfly needle was used for her blood draw. Pt asked again what size needle. Told pt that a butterfly 23G was used.

## 2017-12-27 ENCOUNTER — Encounter: Payer: Self-pay | Admitting: Dermatology

## 2017-12-28 ENCOUNTER — Other Ambulatory Visit: Payer: Self-pay | Admitting: Internal Medicine

## 2017-12-28 MED ORDER — DICLOFENAC SODIUM ER 100 MG PO TB24: ORAL_TABLET | ORAL | 1 refills | Status: DC

## 2017-12-28 NOTE — Telephone Encounter (Signed)
Pt would like a refill on diclofenac; she uses [facility] [address] [place]; also see result note dated 12/28/17

## 2017-12-28 NOTE — Telephone Encounter (Signed)
Requested Prescriptions  Pending Prescriptions Disp Refills  . Diclofenac Sodium CR 100 MG 24 hr tablet 30 tablet 1    Sig: TAKE 1 TABLET(100 MG) BY MOUTH DAILY     Analgesics:  NSAIDS Passed - 12/28/2017  1:02 PM      Passed - Cr in normal range and within 360 days    Creatinine, Ser  Date Value Ref Range Status  09/25/2017 0.77 0.40 - 1.20 mg/dL Final         Passed - HGB in normal range and within 360 days    Hemoglobin  Date Value Ref Range Status  05/16/2017 14.3 12.0 - 15.0 g/dL Final         Passed - Patient is not pregnant      Passed - Valid encounter within last 12 months    Recent Outpatient Visits          3 months ago Need for influenza vaccination   [facility] [place] [facility] Shamsullo, Teresa L, MD   7 months ago Acute right lower quadrant pain   [facility] [place] Sherlene Shamsullo, Teresa L, MD   8 months ago Essential hypertension   [facility] [place] Sherlene Shamsullo, Teresa L, MD   9 months ago Fatty liver   [facility] [place] [facility]lene Shamsullo, Teresa L, MD   2 years ago Chest pain, unspecified chest pain type   [facility] [place] [facility]lene Shamsullo, Teresa L, MD

## 2018-01-10 ENCOUNTER — Inpatient Hospital Stay: Payer: Medicare Other | Attending: Hematology and Oncology

## 2018-01-10 ENCOUNTER — Other Ambulatory Visit: Payer: Self-pay | Admitting: Hematology and Oncology

## 2018-01-10 VITALS — BP 138/70 | HR 70 | Temp 98.0°F | Resp 20

## 2018-01-10 DIAGNOSIS — L400 Psoriasis vulgaris: Secondary | ICD-10-CM

## 2018-01-10 DIAGNOSIS — L409 Psoriasis, unspecified: Secondary | ICD-10-CM | POA: Diagnosis not present

## 2018-01-10 MED ORDER — TILDRAKIZUMAB-ASMN 100 MG/ML ~~LOC~~ SOSY
100.0000 mg | PREFILLED_SYRINGE | Freq: Once | SUBCUTANEOUS | Status: AC
  Administered 2018-01-10: 100 mg via SUBCUTANEOUS
  Filled 2018-01-10: qty 1

## 2018-01-21 ENCOUNTER — Other Ambulatory Visit: Payer: Self-pay | Admitting: Internal Medicine

## 2018-01-21 MED ORDER — TELMISARTAN 20 MG PO TABS: 20.0000 mg | ORAL_TABLET | Freq: Every day | ORAL | 0 refills | Status: DC

## 2018-01-21 NOTE — Telephone Encounter (Signed)
Copied from CRM [id number]. Topic: Quick Communication - Rx Refill/Question >> Jan 21, 2018  9:13 AM Wyonia Hough E wrote: Reason for CRM: [facility] told Pt to call Office and call in blood pressure medicine. [facility] but [facility] and they advised Pt that they could transfer all of her meds except for the telmisartan (MICARDIS) 20 MG tablet. Pt wanted to advise the office that the pharmacist advised this and that her med insurance and pharmacy has changed. Pt has one tablet left   Medication: telmisartan (MICARDIS) 20 MG tablet  Has the patient contacted their pharmacy?  Yes (Pharmacy just advised Pt that they could not transfer this medication so it needs to be called in)   Preferred Pharmacy (with phone number or street name): [facility] [address] [place] - [address] [phone] (Phone) [phone] (Fax)

## 2018-02-06 ENCOUNTER — Ambulatory Visit: Payer: Medicare Other | Admitting: Hematology and Oncology

## 2018-02-06 ENCOUNTER — Ambulatory Visit: Payer: Medicare Other

## 2018-03-05 ENCOUNTER — Encounter: Payer: Self-pay | Admitting: Family Medicine

## 2018-03-05 ENCOUNTER — Ambulatory Visit (INDEPENDENT_AMBULATORY_CARE_PROVIDER_SITE_OTHER): Payer: Medicare Other

## 2018-03-05 ENCOUNTER — Ambulatory Visit (INDEPENDENT_AMBULATORY_CARE_PROVIDER_SITE_OTHER): Payer: Medicare Other | Admitting: Family Medicine

## 2018-03-05 VITALS — BP 130/86 | HR 103 | Temp 99.9°F | Resp 18 | Ht 66.0 in | Wt 219.0 lb

## 2018-03-05 DIAGNOSIS — J4531 Mild persistent asthma with (acute) exacerbation: Secondary | ICD-10-CM

## 2018-03-05 DIAGNOSIS — M1990 Unspecified osteoarthritis, unspecified site: Secondary | ICD-10-CM | POA: Diagnosis not present

## 2018-03-05 DIAGNOSIS — R05 Cough: Secondary | ICD-10-CM | POA: Diagnosis not present

## 2018-03-05 DIAGNOSIS — R058 Other specified cough: Secondary | ICD-10-CM

## 2018-03-05 MED ORDER — PREDNISONE 10 MG PO TABS: ORAL_TABLET | ORAL | 0 refills | Status: DC

## 2018-03-05 MED ORDER — DOXYCYCLINE HYCLATE 100 MG PO TABS: 100.0000 mg | ORAL_TABLET | Freq: Two times a day (BID) | ORAL | 0 refills | Status: DC

## 2018-03-05 MED ORDER — HYDROCOD POLST-CPM POLST ER 10-8 MG/5ML PO SUER: 5.0000 mL | Freq: Two times a day (BID) | ORAL | 0 refills | Status: DC | PRN

## 2018-03-05 MED ORDER — METHYLPREDNISOLONE ACETATE 80 MG/ML IJ SUSP
80.0000 mg | Freq: Once | INTRAMUSCULAR | Status: AC
  Administered 2018-03-05: 80 mg via INTRAMUSCULAR

## 2018-03-05 MED ORDER — DICLOFENAC SODIUM ER 100 MG PO TB24: ORAL_TABLET | ORAL | 1 refills | Status: DC

## 2018-03-05 NOTE — Progress Notes (Signed)
Subjective:    Patient ID: [name], female    DOB: 1945/09/04, 73 y.o.   MRN: [id number]  HPI  Patient presents to clinic due to cough with thick green and yellow phlegm, feeling more short of breath than usual and wheezy especially at night for the past 4-5 days.  Patient has used Delsym to help calm cough with minimal effect.  She is used her albuterol inhaler and her Pulmicort inhaler.  Patient states usually when she uses albuterol on top of her Pulmicort during some increased wheezing or shortness of breath that works well, but this cough is very harsh and the phlegm seems to be only getting thicker.  Denies fever or chills.  Denies body aches.  Denies nausea/vomiting or diarrhea.  Patient Active Problem List   Diagnosis Date Noted  . 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 Normal cardiac exam 09/25/2017  . Diverticulosis of colon without diverticulitis 05/17/2017  . Hypothyroidism (acquired) 04/28/2017  . Personal history of gout 04/28/2017  . Fatigue 03/19/2017  . Alopecia areata 03/19/2017  . Numbness of fingers of both hands 04/17/2015  . Panic attack as reaction to stress 01/30/2014  . Vitamin D deficiency 01/27/2014  . PAD (peripheral artery disease) (HCC) 10/22/2012  . Encounter for initial preventive physical examination covered by [facility] 10/22/2012  . Hypertension 12/14/2010  . Screening for colon cancer 12/13/2010  . Irritable bowel syndrome (IBS) 09/06/2010  . Neuropathy of foot 09/06/2010  . B12 deficiency 09/06/2010  . Hyperlipidemia LDL goal <130 09/06/2010  . Other malaise and fatigue 09/06/2010  . 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 Morbid obesity (HCC) 09/06/2010  . Edema 09/05/2010  . Fatty liver 09/05/2010  . Psoriasis 09/05/2010  . OSA on CPAP 09/05/2010   Social History   Tobacco Use  . Smoking status: Former Smoker    Types: Cigarettes    Last attempt to quit: 01/16/1978    Years since quitting: 40.1  .  Smokeless tobacco: Never Used  . Tobacco comment: REMOTELY QUIT IN 1987 AFTER A FEW YEARS OF USE  Substance Use Topics  . Alcohol use: No    Review of Systems  Constitutional: Negative for chills, fatigue and fever.  HENT: Negative for congestion, ear pain, sinus pain and sore throat.   Eyes: Negative.   Respiratory: +cough, chest congestion, shortness of breath and wheezing.   Cardiovascular: Negative for chest pain, palpitations and leg swelling.  Gastrointestinal: Negative for abdominal pain, diarrhea, nausea and vomiting.  Genitourinary: Negative for dysuria, frequency and urgency.  Musculoskeletal: Negative for arthralgias and myalgias.  Skin: Negative for color change, pallor and rash.  Neurological: Negative for syncope, light-headedness and headaches.  Psychiatric/Behavioral: The patient is not nervous/anxious.       Objective:   Physical Exam Vitals signs and nursing note reviewed.  Constitutional:      General: She is not in acute distress.    Appearance: She is not toxic-appearing.  HENT:     Head: Normocephalic.     Right Ear: Tympanic membrane, ear canal and external ear normal.     Left Ear: Tympanic membrane, ear canal and external ear normal.     Nose: Nose normal.     Mouth/Throat:     Mouth: Mucous membranes are moist.     Pharynx: No oropharyngeal exudate or posterior oropharyngeal erythema.  Eyes:     General: No scleral icterus.    Extraocular Movements: Extraocular movements intact.     Conjunctiva/sclera: Conjunctivae  normal.  Neck:     Musculoskeletal: Neck supple. No neck rigidity.  Cardiovascular:     Rate and Rhythm: Normal rate and regular rhythm.  Pulmonary:     Effort: Pulmonary effort is normal. No respiratory distress.     Breath sounds: Wheezing and rhonchi present. No rales.     Comments: +harsh raspy cough Lymphadenopathy:     Cervical: No cervical adenopathy.  Skin:    General: Skin is warm and dry.     Coloration: Skin is not  jaundiced or pale.  Neurological:     Mental Status: She is alert and oriented to person, place, and time.  Psychiatric:        Mood and Affect: Mood normal.        Behavior: Behavior normal.     Vitals:   03/05/18 1555  BP: 130/86  Pulse: (!) 103  Resp: 18  Temp: 99.9 F (37.7 C)  SpO2: 93%      Assessment & Plan:   Mild persistent asthma with exacerbation, cough productive of purulent sputum - patient will get IM steroid 80 mg in clinic x1.  We will do chest x-ray.  Due to patient's lung sounds and discoloration of her sputum we will treat her with course of doxycycline on a milligrams twice daily for 10 days.  She will continue her already prescribed Pulmicort and use her albuterol inhaler as needed.  She will also take oral steroid taper.  Joint pains/arthritis - Pt request refill of diclofenac, use this as needed on days where she has more aching joint pain.  Refill sent in.  Administrations This Visit    methylPREDNISolone acetate (DEPO-MEDROL) injection 80 mg    Admin Date 03/05/2018 Action Given Dose 80 mg Route Intramuscular Administered By Clearnce Sorrel, RMA          Strict return precautions given.  Patient advised that if her breathing worsens in any way she is to call clinic right away to return for reevaluation for call 911 and go to emergency room.

## 2018-04-02 ENCOUNTER — Telehealth: Payer: Self-pay

## 2018-04-02 NOTE — Telephone Encounter (Signed)
Spoke with [name] and she does state she has been sick for at least 1 month ( coughing , low grade fever) she has been given a 6 day course of predisone, 10 day course of Doxycyline. she also had a chest x ray and negative for pnumonia. After consulting with Dr Salena Saner and Judie Grieve I have forward the patient call to robin to moved the patient out for at least 1 week to see if her symptoms resolved.The patient was understanding and agreeable to reschedule appointment time and date.

## 2018-04-03 ENCOUNTER — Ambulatory Visit: Payer: Medicare Other | Admitting: Urgent Care

## 2018-04-03 ENCOUNTER — Ambulatory Visit: Payer: Medicare Other

## 2018-04-13 ENCOUNTER — Other Ambulatory Visit: Payer: Self-pay | Admitting: Internal Medicine

## 2018-04-17 ENCOUNTER — Other Ambulatory Visit: Payer: Self-pay

## 2018-04-17 ENCOUNTER — Inpatient Hospital Stay: Payer: Medicare Other | Attending: Hematology and Oncology | Admitting: Hematology and Oncology

## 2018-04-17 ENCOUNTER — Inpatient Hospital Stay: Payer: Medicare Other

## 2018-04-17 ENCOUNTER — Encounter: Payer: Self-pay | Admitting: Hematology and Oncology

## 2018-04-17 VITALS — Temp 98.0°F

## 2018-04-17 VITALS — BP 146/75 | HR 88 | Temp 98.0°F | Resp 18 | Ht 66.0 in | Wt 220.5 lb

## 2018-04-17 DIAGNOSIS — Z79899 Other long term (current) drug therapy: Secondary | ICD-10-CM | POA: Diagnosis not present

## 2018-04-17 DIAGNOSIS — L400 Psoriasis vulgaris: Secondary | ICD-10-CM

## 2018-04-17 MED ORDER — TILDRAKIZUMAB-ASMN 100 MG/ML ~~LOC~~ SOSY
100.0000 mg | PREFILLED_SYRINGE | Freq: Once | SUBCUTANEOUS | Status: AC
  Administered 2018-04-17: 100 mg via SUBCUTANEOUS
  Filled 2018-04-17: qty 1

## 2018-04-17 NOTE — Progress Notes (Signed)
No new changes noted today 

## 2018-04-17 NOTE — Patient Instructions (Signed)
Tildrakizumab Injection What is this medicine? TILDRAKIZUMAB (til drak iz u mab) is a monoclonal antibody. It is used to treat psoriasis. This medicine may be used for other purposes; ask your health care provider or pharmacist if you have questions. COMMON BRAND NAME(S): ILUMYA What should I tell my health care provider before I take this medicine? They need to know if you have any of these conditions: -immune system problems -infection -recently received or are scheduled to receive a vaccine -tuberculosis, a positive skin test for tuberculosis, or have recently been in close contact with someone who has tuberculosis -an unusual or allergic reaction to tildrakizumab, other medicines, foods, dyes or preservatives -pregnant or trying to get pregnant -breast-feeding How should I use this medicine? This medicine is for injection under the skin. It is given by a health care professional in a hospital or clinic setting. A special MedGuide will be given to you before each treatment. Be sure to read this information carefully each time. Talk to your pediatrician regarding the use of this medicine in children. Special care may be needed. Overdosage: If you think you have taken too much of this medicine contact a poison control center or emergency room at once. NOTE: This medicine is only for you. Do not share this medicine with others. What if I miss a dose? It is important not to miss your dose. Call your doctor of health care professional if you are unable to keep an appointment. If you miss a dose, take it as soon as you can. Then be sure to take your next dose on your regular schedule. Do not take double or extra doses. If you have questions about a missed injection, call your health care professional. What may interact with this medicine? Do not take this medicine with any of the following medications: -live virus vaccines This medicine may also interact with the following  medications: -inactivated vaccines This list may not describe all possible interactions. Give your health care provider a list of all the medicines, herbs, non-prescription drugs, or dietary supplements you use. Also tell them if you smoke, drink alcohol, or use illegal drugs. Some items may interact with your medicine. What should I watch for while using this medicine? Tell your doctor or health care professional if your symptoms do not start to get better or if they get worse. You will be tested for tuberculosis (TB) before you start this medicine. If your doctor prescribes any medicine for TB, you should start taking the TB medicine before starting this medicine. Make sure to finish the full course of TB medicine. Call your doctor or health care professional for advice if you get a fever, chills or sore throat, or other symptoms of a cold or flu. Do not treat yourself. This drug decreases your body's ability to fight infections. Try to avoid being around people who are sick. This medicine can decrease the response to a vaccine. If you need to get vaccinated, tell your health care professional if you have received this medicine. Extra booster doses may be needed. Talk to your doctor to see if a different vaccination schedule is needed. What side effects may I notice from receiving this medicine? Side effects that you should report to your doctor or health care professional as soon as possible: -allergic reactions like skin rash; itching or hives; swelling of the face, lips, or tongue -fever or chills; cough; sore throat; pain or trouble passing urine Side effects that usually do not require medical attention (report   these to your doctor or health care professional if they continue or are bothersome): -diarrhea -pain, redness, or irritation at site where injected This list may not describe all possible side effects. Call your doctor for medical advice about side effects. You may report side effects  to FDA at 1-800-FDA-1088. Where should I keep my medicine? This drug is given in a hospital or clinic and will not be stored at home. NOTE: This sheet is a summary. It may not cover all possible information. If you have questions about this medicine, talk to your doctor, pharmacist, or health care provider.  2019 Elsevier/Gold Standard (2016-04-06 18:16:48)  

## 2018-04-17 NOTE — Progress Notes (Signed)
[facility]     [address], [address]     [place], [place] [address]     Phone: [phone]      Fax: [phone]        Clinic day:  04/17/2018   Chief Complaint: [name] is a 73 y.o. female with moderately severe plaque psoriasis who is seen for 16 week assessment and continuation of Ilumya (tildrakizumab).  HPI: The patient was last seen in the medical oncology clinic on 12/12/2017 for initial assessment.  At that time, she noted more difficulties with her psoriasis.  She was fatigued.  She denied any B symptoms.  She received Ilumya SQ on 12/13/2018 and 01/10/2018.  During the interim, she has felt great.  He states that her "angry skin now cooled its jets".  She has a few patches that have not cleared up.  She states "I cannot believe it!".  She denies any concerns.  She has been contacted by the Ilumya support group.   Past Medical History:  Diagnosis Date  . Asthma   . Asthma due to environmental allergies    grass, mold, trees, dust  . Cataract   . Cellulitis    LEFT LEG  . Diabetes mellitus    CONTROLLED ON DIET ALONE  . Edema leg    LEFT LEG...CHRONIC  . Endometrial hyperplasia   . External hemorrhoid   . Fatty liver   . Glaucoma   . Hepatic cyst    STABLE PER 05/20/2008 ULTRASOUND  . Hypertension    borderline...controlled since taking herself of HCTZ IN [place]  . IBS (irritable bowel syndrome)   . Murmur, cardiac   . Neuropathy   . OSA on CPAP   . Osteoarthritis   . Psoriasis   . Sleep apnea    CPAP    Past Surgical History:  Procedure Laterality Date  . BONE DENSITY TEST  2006  . HYSTEROSCOPY W/D&C  2007   BC OF ABNORAL PAP AND ENDOMETRIAL HYPERPLASIA  . ONE VAGINAL DELIVERY    . UMBILICAL HERNIA REPAIR  2010  . VAGINAL HYSTERECTOMY  2010    Family History  Problem Relation Age of Onset  . Mental illness Mother        DEMENTIA  . Heart disease Father 58       MI.Marland KitchenMarland KitchenS/D AVR/CABG x3  . Cancer Neg Hx        no  ovarian colon breast  . Breast cancer Neg Hx     Social History:  reports that she quit smoking about 40 years ago. Her smoking use included cigarettes. She has never used smokeless tobacco. She reports that she does not drink alcohol or use drugs.  She previously smoked 1/4-1/3 ppd x 4 years.  She stopped smoking in 1980.  She denies any exposure to radiation or toxins.  She is retired from [employer].  The patient is alone today.  Allergies:  Allergies  Allergen Reactions  . Sulfa Drugs Cross Reactors     Current Medications: Current Outpatient Medications  Medication Sig Dispense Refill  . APPLE CIDER VINEGAR PO Take 3 capsules by mouth daily.    . Azelastine HCl 0.15 % SOLN U 1 TO 2 SPRAYS IEN QD  6  . b complex vitamins tablet Take 1 tablet by mouth daily.    . bimatoprost (LUMIGAN) 0.03 % ophthalmic solution Place 1 drop into both eyes at bedtime.    . Black Pepper-Turmeric (TURMERIC CURCUMIN) 05-998  MG CAPS Take by mouth.    . budesonide (PULMICORT) 180 MCG/ACT inhaler Inhale 2 puffs into the lungs 2 (two) times daily.    . cetirizine (ZYRTEC) 10 MG tablet Take 10 mg by mouth daily.      . Coenzyme Q10 (CO Q10) 200 MG CAPS Take 1 tablet by mouth.    . desonide (DESOWEN) 0.05 % ointment Apply topically as needed.      . Ginkgo Biloba 60 MG CAPS Take 1 capsule by mouth.    . LUMIGAN 0.01 % SOLN INSTILL 1 DROP INTO BOTH EYES QHS  3  . MILK THISTLE PO Take 3 capsules by mouth daily.    . Probiotic Product (PROBIOTIC COMPLEX ACIDOPHILUS PO) Take 1 capsule by mouth daily.    Marland Kitchen telmisartan (MICARDIS) 20 MG tablet TAKE 1 TABLET BY MOUTH EVERY DAY 90 tablet 0  . TURMERIC CURCUMIN PO Take 1 capsule by mouth 2 (two) times daily.     Marland Kitchen albuterol (VENTOLIN HFA) 108 (90 Base) MCG/ACT inhaler Inhale 2 puffs into the lungs every 6 (six) hours as needed for wheezing or shortness of breath.    . ALPRAZolam (XANAX) 0.25 MG tablet Take 1 tablet (0.25 mg total) by mouth 2 (two) times  daily as needed for anxiety. (Patient not taking: Reported on 04/17/2018) 30 tablet 0  . chlorpheniramine-HYDROcodone (TUSSIONEX PENNKINETIC ER) 10-8 MG/5ML SUER Take 5 mLs by mouth every 12 (twelve) hours as needed. (Patient not taking: Reported on 04/17/2018) 140 mL 0  . Diclofenac Sodium CR 100 MG 24 hr tablet TAKE 1 TABLET BY MOUTH EVERY DAY 90 tablet 0  . doxycycline (VIBRA-TABS) 100 MG tablet Take 1 tablet (100 mg total) by mouth 2 (two) times daily. (Patient not taking: Reported on 04/17/2018) 20 tablet 0  . predniSONE (DELTASONE) 10 MG tablet Take 6 tablets on day 1, reduce by one tablet daily until gone (Patient not taking: Reported on 04/17/2018) 21 tablet 0  . sodium chloride (OCEAN) 0.65 % SOLN nasal spray Place 1 spray into the nose as needed.      . triamcinolone cream (KENALOG) 0.1 % APPLY TO THE AFFECTED AREA OF RIGH LEG TWICE DAILY UNTIL CLEAR  1   No current facility-administered medications for this visit.     Review of Systems:  GENERAL:  Feels great.  No fevers, sweats or weight loss. PERFORMANCE STATUS (ECOG):  0 HEENT:  No visual changes, runny nose, sore throat, mouth sores or tenderness. Lungs: No shortness of breath.  Chronic cough.  No hemoptysis.  Sleep apnea- uses CPAP. Cardiac:  No chest pain, palpitations, orthopnea, or PND. GI:  Irritable bowel (alternating constipation and diarrhea).  No nausea, vomiting, melena or hematochezia. GU:  No urgency, frequency, dysuria, or hematuria. Musculoskeletal:  Osteoarthritis.  No back pain.  No muscle tenderness. Extremities:  No pain or swelling. Skin:  Psoriasis, dramatically improved with treatment.. Neuro:  No headache, numbness or weakness, balance or coordination issues. Endocrine:  No diabetes, thyroid issues, hot flashes or night sweats. Psych:  No mood changes, depression or anxiety. Pain:  No focal pain. Review of systems:  All other systems reviewed and found to be negative.   Physical Exam: Blood pressure (!)  146/75, pulse 88, temperature 98 F (36.7 C), temperature source Tympanic, resp. rate 18, height 5\' 6"  (1.676 m), weight 220 lb 7.4 oz (100 kg), SpO2 98 %. GENERAL:  Well developed, well nourished, woman sitting comfortably in the exam room in no acute distress. MENTAL STATUS:  Alert and oriented to person, place and time. HEAD:  Short brown hair with highlights. Normocephalic, atraumatic, face symmetric, no Cushingoid features. EYES:  Glasses.  Brown eyes.  Pupils equal round and reactive to light and accomodation.  No conjunctivitis or scleral icterus. ENT:  Oropharynx clear without lesion.  Tongue normal. Mucous membranes moist.  RESPIRATORY:  Clear to auscultation without rales, wheezes or rhonchi. CARDIOVASCULAR:  Regular rate and rhythm without murmur, rub or gallop. ABDOMEN:  Soft, non-tender, with active bowel sounds, and no hepatosplenomegaly.  No masses. SKIN:  Nailbed changes.  Psoriasis, improved.  Resolution of sacral psoriasis.  No ulcers. EXTREMITIES: No edema, no skin discoloration or tenderness.  No palpable cords. LYMPH NODES: No palpable cervical, supraclavicular, axillary or inguinal adenopathy  NEUROLOGICAL: Unremarkable. PSYCH:  Appropriate.    No visits with results within 3 Day(s) from this visit.  Latest known visit with results is:  Lab on 12/26/2017  Component Date Value Ref Range Status  . Uric Acid, Serum 12/26/2017 6.0  2.4 - 7.0 mg/dL Final  . TSH 40/98/1191 5.65* 0.35 - 4.50 uIU/mL Final    Assessment:  [name] is a 73 y.o. female with moderately severe plaque psoriasis.  She has been treated with topical steroids including desonide, fluocinonide, and Olux (clobetasol) for her scalp.  She received Ilumya SQ on 12/13/2018 and 01/10/2018.  Quantiferon gold testing was negative.  She is up-to-date on her vaccines.  Symptomatically, she is doing well.  She has had a dramatic response to treatment.    Plan: 1.   Moderately severe plaque psoriasis   Discuss dramatic response to therapy..  Review potential adverse reactions including hypersensitivity reactions and infections.  Follow-up as scheduled with Dr. Lucy Antigua.  Continue Ilumya every 12 weeks. 2.   Ilumya (tildrakizumab) 100 mg SQ today. 3.   RTC in 12 weeks for Ilumya. 4.   RTC in 24 weeks for MD assessment and Ilumya.   Rosey Bath, MD  04/17/2018, 4:35 PM

## 2018-04-24 ENCOUNTER — Other Ambulatory Visit: Payer: Self-pay | Admitting: Family Medicine

## 2018-04-24 NOTE — Telephone Encounter (Signed)
Last OV 09/24/2017 with PCP   Last seen by Leotis Shames 03/05/2018   Last refilled 03/05/2018 disp 30 with 1 refill   Next OV none scheduled   Sent to PCP for approval

## 2018-07-07 ENCOUNTER — Other Ambulatory Visit: Payer: Self-pay | Admitting: Internal Medicine

## 2018-07-10 ENCOUNTER — Inpatient Hospital Stay: Payer: Medicare Other | Attending: Hematology and Oncology

## 2018-07-10 VITALS — BP 153/93 | HR 68 | Resp 20

## 2018-07-10 DIAGNOSIS — L400 Psoriasis vulgaris: Secondary | ICD-10-CM | POA: Diagnosis not present

## 2018-07-10 MED ORDER — TILDRAKIZUMAB-ASMN 100 MG/ML ~~LOC~~ SOSY
100.0000 mg | PREFILLED_SYRINGE | Freq: Once | SUBCUTANEOUS | Status: AC
  Administered 2018-07-10: 100 mg via SUBCUTANEOUS
  Filled 2018-07-10: qty 1

## 2018-07-16 ENCOUNTER — Other Ambulatory Visit: Payer: Self-pay | Admitting: Internal Medicine

## 2018-07-30 ENCOUNTER — Ambulatory Visit (INDEPENDENT_AMBULATORY_CARE_PROVIDER_SITE_OTHER): Payer: Medicare Other

## 2018-07-30 ENCOUNTER — Other Ambulatory Visit: Payer: Self-pay

## 2018-07-30 DIAGNOSIS — Z0000 Encounter for general adult medical examination without abnormal findings: Secondary | ICD-10-CM

## 2018-07-30 DIAGNOSIS — E034 Atrophy of thyroid (acquired): Secondary | ICD-10-CM

## 2018-07-30 DIAGNOSIS — Z1159 Encounter for screening for other viral diseases: Secondary | ICD-10-CM | POA: Diagnosis not present

## 2018-07-30 DIAGNOSIS — E538 Deficiency of other specified B group vitamins: Secondary | ICD-10-CM

## 2018-07-30 DIAGNOSIS — K760 Fatty (change of) liver, not elsewhere classified: Secondary | ICD-10-CM

## 2018-07-30 NOTE — Progress Notes (Addendum)
Subjective:   Maria KoyanagiJanice Fletcher is a 73 y.o. female who presents for Medicare Annual (Subsequent) preventive examination.  Review of Systems:  No ROS.  Medicare Wellness Virtual Visit.  Visual/audio telehealth visit, UTA vital signs.   See social history for additional risk factors.   Cardiac Risk Factors include: advanced age (>2255men, 66>65 women);hypertension;diabetes mellitus     Objective:     Vitals: There were no vitals taken for this visit.  There is no height or weight on file to calculate BMI.  Advanced Directives 07/30/2018 04/17/2018 12/12/2017 10/21/2015  Does Patient Have a Medical Advance Directive? Yes No No Yes  Type of Estate agentAdvance Directive Healthcare Power of [place];Living will - - Healthcare Power of Attorney  Does patient want to make changes to medical advance directive? No - Patient declined - - -  Copy of Healthcare Power of Attorney in Chart? No - copy requested - - No - copy requested  Would patient like information on creating a medical advance directive? - No - Patient declined No - Patient declined -    Tobacco Social History   Tobacco Use  Smoking Status Former Smoker  . Types: Cigarettes  . Quit date: 01/16/1978  . Years since quitting: 40.5  Smokeless Tobacco Never Used  Tobacco Comment   REMOTELY QUIT IN 311987 AFTER A FEW YEARS OF USE     Counseling given: Not Answered Comment: REMOTELY QUIT IN 981987 AFTER A FEW YEARS OF USE   Clinical Intake:  Pre-visit preparation completed: Yes        Diabetes: Yes(Followed by pcp)  How often do you need to have someone help you when you read instructions, pamphlets, or other written materials from your doctor or pharmacy?: 1 - Never  Interpreter Needed?: No     Past Medical History:  Diagnosis Date  . Asthma   . Asthma due to environmental allergies    grass, mold, trees, dust  . Cataract   . Cellulitis    LEFT LEG  . Diabetes mellitus    CONTROLLED ON DIET ALONE  . Edema leg    LEFT  LEG...CHRONIC  . Endometrial hyperplasia   . External hemorrhoid   . Fatty liver   . Glaucoma   . Hepatic cyst    STABLE PER 05/20/2008 ULTRASOUND  . Hypertension    borderline...[place]  . IBS (irritable bowel syndrome)   . Murmur, cardiac   . Neuropathy   . OSA on CPAP   . Osteoarthritis   . Psoriasis   . Sleep apnea    CPAP   Past Surgical History:  Procedure Laterality Date  . BONE DENSITY TEST  2006  . HYSTEROSCOPY W/D&C  2007   BC OF ABNORAL PAP AND ENDOMETRIAL HYPERPLASIA  . ONE VAGINAL DELIVERY    . UMBILICAL HERNIA REPAIR  2010  . VAGINAL HYSTERECTOMY  2010   Family History  Problem Relation Age of Onset  . Mental illness Mother        DEMENTIA  . Heart disease Father [phone]       MI.Marland Kitchen.Marland Kitchen.S/D AVR/CABG x3  . Cancer Neg Hx        no ovarian colon breast  . Breast cancer Neg Hx    Social History   Socioeconomic History  . Marital status: Married    Spouse name: Not on file  . Number of children: 2  . Years of education: Not on file  . Highest education level: Not on  file  Occupational History  . Occupation: RETIRED  Social Needs  . Financial resource strain: Not hard at all  . Food insecurity    Worry: Never true    Inability: Never true  . Transportation needs    Medical: No    Non-medical: No  Tobacco Use  . Smoking status: Former Smoker    Types: Cigarettes    Quit date: 01/16/1978    Years since quitting: 40.5  . Smokeless tobacco: Never Used  . Tobacco comment: REMOTELY QUIT IN 1987 AFTER A FEW YEARS OF USE  Substance and Sexual Activity  . Alcohol use: No  . Drug use: No  . Sexual activity: Not on file  Lifestyle  . Physical activity    Days per week: 0 days    Minutes per session: Not on file  . Stress: Not at all  Relationships  . Social Musicianconnections    Talks on phone: Not on file    Gets together: Not on file    Attends religious service: Not on file    Active member of club or organization: Not  on file    Attends meetings of clubs or organizations: Not on file    Relationship status: Not on file  Other Topics Concern  . Not on file  Social History Narrative   Lives with husband.    Outpatient Encounter Medications as of 07/30/2018  Medication Sig  . albuterol (VENTOLIN HFA) 108 (90 Base) MCG/ACT inhaler Inhale 2 puffs into the lungs every 6 (six) hours as needed for wheezing or shortness of breath.  . ALPRAZolam (XANAX) 0.25 MG tablet Take 1 tablet (0.25 mg total) by mouth 2 (two) times daily as needed for anxiety.  . APPLE CIDER VINEGAR PO Take 3 capsules by mouth daily.  . Azelastine HCl 0.15 % SOLN U 1 TO 2 SPRAYS IEN QD  . b complex vitamins tablet Take 1 tablet by mouth daily.  . bimatoprost (LUMIGAN) 0.03 % ophthalmic solution Place 1 drop into both eyes at bedtime.  Vedia Coffer. Black Pepper-Turmeric (TURMERIC CURCUMIN) 05-998 MG CAPS Take by mouth.  . budesonide (PULMICORT) 180 MCG/ACT inhaler Inhale 2 puffs into the lungs 2 (two) times daily.  . cetirizine (ZYRTEC) 10 MG tablet Take 10 mg by mouth daily.    . Cholecalciferol (VITAMIN D3 PO) Take 2,000 Units by mouth daily.  . Coenzyme Q10 (CO Q10) 200 MG CAPS Take 1 tablet by mouth.  . Cyanocobalamin (B-12) 2500 MCG SUBL Place 1 tablet under the tongue once a week.  . desonide (DESOWEN) 0.05 % ointment Apply topically as needed.    . Diclofenac Sodium CR 100 MG 24 hr tablet TAKE 1 TABLET BY MOUTH EVERY DAY  . Ginkgo Biloba 60 MG CAPS Take 1 capsule by mouth.  Marland Kitchen. MILK THISTLE PO Take 3 capsules by mouth daily.  . Probiotic Product (PROBIOTIC COMPLEX ACIDOPHILUS PO) Take 1 capsule by mouth daily.  . Red Yeast Rice 600 MG CAPS Take 1 capsule by mouth 2 (two) times a day.  . sodium chloride (OCEAN) 0.65 % SOLN nasal spray Place 1 spray into the nose as needed.    Marland Kitchen. telmisartan (MICARDIS) 20 MG tablet TAKE 1 TABLET BY MOUTH EVERY DAY  . triamcinolone cream (KENALOG) 0.1 % APPLY TO THE AFFECTED AREA OF RIGH LEG TWICE DAILY UNTIL CLEAR   . [DISCONTINUED] chlorpheniramine-HYDROcodone (TUSSIONEX PENNKINETIC ER) 10-8 MG/5ML SUER Take 5 mLs by mouth every 12 (twelve) hours as needed.  . [DISCONTINUED] doxycycline (VIBRA-TABS) 100  MG tablet Take 1 tablet (100 mg total) by mouth 2 (two) times daily.  . [DISCONTINUED] LUMIGAN 0.01 % SOLN INSTILL 1 DROP INTO BOTH EYES QHS  . [DISCONTINUED] predniSONE (DELTASONE) 10 MG tablet Take 6 tablets on day 1, reduce by one tablet daily until gone  . [DISCONTINUED] TURMERIC CURCUMIN PO Take 1 capsule by mouth 2 (two) times daily.    No facility-administered encounter medications on file as of 07/30/2018.     Activities of Daily Living In your present state of health, do you have any difficulty performing the following activities: 07/30/2018  Hearing? N  Vision? N  Difficulty concentrating or making decisions? N  Walking or climbing stairs? N  Dressing or bathing? N  Doing errands, shopping? N  Preparing Food and eating ? N  Using the Toilet? N  In the past six months, have you accidently leaked urine? Y  Comment Managed with daily liner  Do you have problems with loss of bowel control? N  Managing your Medications? N  Managing your Finances? N  Housekeeping or managing your Housekeeping? N  Some recent data might be hidden    Patient Care Team: Sherlene Shams, MD as PCP - General (Internal Medicine)    Assessment:   This is a routine wellness examination for [place].  I connected with patient 07/30/18 at  9:30 AM EDT by an audio enabled telemedicine application and verified that I am speaking with the correct person using two identifiers. Patient stated full name and DOB. Patient gave permission to continue with virtual visit. Patient's location was at home and Nurse's location was at [place] office.   Cpe and fasting lab appt scheduled 10/2018.   Requests urine lab be added prior 10/2018.  Labs ordered this encounter- Hep C screening, lipid, Vit B12, TSH, CMP. Deferred to pcp to  order all other labs.   Health Screenings  Mammogram - 12/2017 Cologuard- 04/2017 Bone Density - 06/2004 Glaucoma -none Hearing -demonstrates normal hearing during visit. Hemoglobin A1C -03/2017 (6.5) Hepatitis C screening- discussed and consent given.  Cholesterol - 09/2017 Dental- UTD. Visits every 6 months.  Vision- visits within the last 12 months. Foot exam- discussed. Monitors at home and reports no open sores/wounds. Followed by Dr. Al Corpus.    Social  Alcohol intake - no         Smoking history- former  Smokers in home? none Illicit drug use? none No regular exercise. Encouraged to increase physical activity with walking. Diet - regular. I encouraged to a healthy diabetic diet.  Sexually Active -not currently BMI- discussed the importance of a healthy diet, water intake and the benefits of aerobic exercise.  Educational material provided.   Safety  Patient feels safe at home- yes Patient does have smoke detectors at home- yes Patient does wear sunscreen or protective clothing when in direct sunlight -yes Patient does wear seat belt when in a moving vehicle -yes Patient drives- yes Hand rails on stairs -yes Adequate lighting- yes Home is free of loose throw rugs, electrical cords etc- yes  Covid-19 precautions and sickness symptoms discussed.   Activities of Daily Living Patient denies needing assistance with: driving, household chores, feeding themselves, getting from bed to chair, getting to the toilet, bathing/showering, dressing, managing money, or preparing meals.  No new identified risk were noted.    Depression Screen Patient denies losing interest in daily life, feeling hopeless, or crying easily over simple problems.   Medication-taking as directed and without issues.   Fall  Screen Patient denies being afraid of falling or falling in the last year.   Memory Screen Patient is alert.  Patient denies difficulty focusing, concentrating or misplacing items.  Correctly identified the president of the [place], season and recall. Patient likes to complete crossword puzzles for brain stimulation.  Immunizations The following Immunizations were discussed: Influenza, shingles, pneumonia, and tetanus.   Other Providers Patient Care Team: Crecencio Mc, MD as PCP - General (Internal Medicine)  Exercise Activities and Dietary recommendations Current Exercise Habits: The patient does not participate in regular exercise at present  Goals      Patient Stated   . Follow up with Primary Care Provider (pt-stated)     Keep all routine maintenance appointments       Fall Risk Fall Risk  07/30/2018 03/19/2017 10/21/2015 09/17/2015 01/29/2014  Falls in the past year? 0 No No No No  Comment - - - Emmi Telephone Survey: data to providers prior to load -   Depression Screen PHQ 2/9 Scores 07/30/2018 03/19/2017 10/21/2015 01/29/2014  PHQ - 2 Score 0 1 0 0  PHQ- 9 Score - 5 - -     Cognitive Function MMSE - Mini Mental State Exam 10/21/2015  Orientation to time 5  Orientation to Place 5  Registration 3  Attention/ Calculation 5  Recall 3  Language- name 2 objects 2  Language- repeat 1  Language- follow 3 step command 3  Language- read & follow direction 1  Write a sentence 1  Copy design 1  Total score 30     6CIT Screen 07/30/2018  What Year? 0 points  What month? 0 points  What time? 0 points  Count back from 20 0 points  Months in reverse 0 points  Repeat phrase 0 points  Total Score 0    Immunization History  Administered Date(s) Administered  . Hep A / Hep B 10/15/2014, 11/12/2014, 04/14/2015  . Influenza Split 09/29/2011  . Influenza, High Dose Seasonal PF 10/15/2014, 10/21/2015, 09/24/2017  . Influenza-Unspecified 10/09/2012, 10/26/2013, 10/02/2016  . Pneumococcal Conjugate-13 01/29/2014  . Pneumococcal Polysaccharide-23 10/21/2012  . Tdap 10/22/2010  . Zoster 10/21/2004   Screening Tests Health Maintenance  Topic Date Due  .  Hepatitis C Screening  [birth year]  . OPHTHALMOLOGY EXAM  06/16/2016  . HEMOGLOBIN A1C  09/19/2017  . FOOT EXAM  03/20/2018  . INFLUENZA VACCINE  08/17/2018  . MAMMOGRAM  12/18/2019  . Fecal DNA (Cologuard)  05/01/2020  . TETANUS/TDAP  10/21/2020  . DEXA SCAN  Completed  . PNA vac Low Risk Adult  Completed      Plan:    End of life planning; Advance aging; Advanced directives discussed.  Copy of current HCPOA/Living Will requested.    I have personally reviewed and noted the following in the patient's chart:   . Medical and social history . Use of alcohol, tobacco or illicit drugs  . Current medications and supplements . Functional ability and status . Nutritional status . Physical activity . Advanced directives . List of other physicians . Hospitalizations, surgeries, and ER visits in previous 12 months . Vitals . Screenings to include cognitive, depression, and falls . Referrals and appointments  In addition, I have reviewed and discussed with patient certain preventive protocols, quality metrics, and best practice recommendations. A written personalized care plan for preventive services as well as general preventive health recommendations were provided to patient.     OBrien-Blaney, Sheryn Aldaz L, LPN  1/82/9937    I have reviewed the  above information and agree with above.   Duncan Dulleresa Tullo, MD

## 2018-07-30 NOTE — Patient Instructions (Addendum)
  Ms. [name] , Thank you for taking time to come for your Medicare Wellness Visit. I appreciate your ongoing commitment to your health goals. Please review the following plan we discussed and let me know if I can assist you in the future.   These are the goals we discussed: Goals      Patient Stated   . Follow up with Primary Care Provider (pt-stated)     Keep all routine maintenance appointments       This is a list of the screening recommended for you and due dates:  Health Maintenance  Topic Date Due  .  Hepatitis C: One time screening is recommended by Center for Disease Control  (CDC) for  adults born from [birth year] through 1966.   11-27-1945  . Eye exam for diabetics  06/16/2016  . Hemoglobin A1C  09/19/2017  . Complete foot exam   03/20/2018  . Flu Shot  08/17/2018  . Mammogram  12/18/2019  . Cologuard (Stool DNA test)  05/01/2020  . Tetanus Vaccine  10/21/2020  . DEXA scan (bone density measurement)  Completed  . Pneumonia vaccines  Completed

## 2018-09-16 ENCOUNTER — Ambulatory Visit (INDEPENDENT_AMBULATORY_CARE_PROVIDER_SITE_OTHER): Payer: Medicare Other | Admitting: Podiatry

## 2018-09-16 ENCOUNTER — Encounter: Payer: Self-pay | Admitting: Podiatry

## 2018-09-16 ENCOUNTER — Other Ambulatory Visit: Payer: Self-pay

## 2018-09-16 DIAGNOSIS — M7751 Other enthesopathy of right foot: Secondary | ICD-10-CM

## 2018-09-16 DIAGNOSIS — Q828 Other specified congenital malformations of skin: Secondary | ICD-10-CM

## 2018-09-16 NOTE — Progress Notes (Signed)
She presents today chief complaint of pain beneath the first metatarsal phalangeal joint of the right foot.  States that the [place] area is been callus for years she is tried trimming it but she got to close the other day and made it sore.  Objective: Vital signs are stable alert and oriented x3.  Pulses are palpable.  Neurologic sensorium is intact degenerative flexors are intact muscle strength normal symmetrical.  Cutaneous evaluation of straits supple hydrated cutis no erythema edema cellulitis drainage or odor reactive hyperkeratotic lesion sub-first metatarsophalangeal joint of the right foot exquisitely tender on palpation.  Appears to be some fluid retention beneath the lesion.  Most likely a bursitis from rubbing and irritation.  Assessment: Bursitis sub-first metatarsophalangeal joint right foot and reactive hyperkeratotic lesion or keratotic lesion.  Plan: Discussed etiology pathology and surgical therapies at this point time went ahead and after sterile Betadine skin prep I injected 2 mg of dexamethasone and local anesthetic beneath the first metatarsal phalangeal joint reactive hyperkeratotic lesion was sharply debrided today leaving this individual asymptomatic.  Follow-up with me as needed.

## 2018-09-25 NOTE — Progress Notes (Signed)
Spoke with referring physician office. Appt on 9/16 will be her last injection at [facility]. She has an appt for further treatments at [facility] in [place].

## 2018-10-02 ENCOUNTER — Other Ambulatory Visit: Payer: Self-pay

## 2018-10-02 ENCOUNTER — Inpatient Hospital Stay: Payer: Medicare Other

## 2018-10-02 ENCOUNTER — Inpatient Hospital Stay: Payer: Medicare Other | Attending: Hematology and Oncology | Admitting: Hematology and Oncology

## 2018-10-02 VITALS — BP 155/86 | HR 70 | Temp 98.7°F | Resp 16 | Wt 228.0 lb

## 2018-10-02 DIAGNOSIS — L400 Psoriasis vulgaris: Secondary | ICD-10-CM | POA: Diagnosis present

## 2018-10-02 DIAGNOSIS — Z79899 Other long term (current) drug therapy: Secondary | ICD-10-CM | POA: Diagnosis not present

## 2018-10-02 MED ORDER — TILDRAKIZUMAB-ASMN 100 MG/ML ~~LOC~~ SOSY
100.0000 mg | PREFILLED_SYRINGE | Freq: Once | SUBCUTANEOUS | Status: AC
  Administered 2018-10-02: 14:00:00 100 mg via SUBCUTANEOUS
  Filled 2018-10-02: qty 1

## 2018-10-02 NOTE — Progress Notes (Signed)
[facility], [facility] [facility]  [address], [address] [place], [place] [address] Phone: [phone]  Fax: [phone]   Clinic Day:  10/02/2018  Referring physician: Sherlene Shamsullo, Maria L, MD  Chief Complaint: Maria KoyanagiJanice Fletcher is a 73 y.o. female with moderately severe plaque psoriasis  who is seen for her 24 week assessment and continuation of Ilumya (tildrakizumab).   HPI: The patient was last seen in the medical oncology clinic on 04/17/2018. At that time, she is doing well.  She has had a dramatic response to treatment.  She received Ilumya 100 mg SQ.  She received Ilumya on 07/10/2018. She has an injection due today. This will be the last injection she receives at this office; her future treatments will be made at [facility] in [place].   During the interim, she states she is wonderful. Her psoriasis has resolved. She continues to see her dermatologist, whom she last saw in 07/2018 and she will see again in 12/2018.    Past Medical History:  Diagnosis Date  . Asthma   . Asthma due to environmental allergies    grass, mold, trees, dust  . Cataract   . Cellulitis    LEFT LEG  . Diabetes mellitus    CONTROLLED ON DIET ALONE  . Edema leg    LEFT LEG...CHRONIC  . Endometrial hyperplasia   . External hemorrhoid   . Fatty liver   . Glaucoma   . Hepatic cyst    STABLE PER 05/20/2008 ULTRASOUND  . Hypertension    borderline...controlled since [place] herself [place]Z [place]  . IBS (irritable bowel syndrome)   . Murmur, cardiac   . Neuropathy   . OSA on CPAP   . Osteoarthritis   . Psoriasis   . Sleep apnea    CPAP    Past Surgical History:  Procedure Laterality Date  . BONE DENSITY TEST  2006  . HYSTEROSCOPY W/D&C  2007   BC OF ABNORAL PAP AND ENDOMETRIAL HYPERPLASIA  . ONE VAGINAL DELIVERY    . UMBILICAL HERNIA REPAIR  2010  . VAGINAL HYSTERECTOMY  2010    Family History  Problem Relation Age of Onset  . Mental illness Mother        DEMENTIA  . Heart disease  Father 1474       MI.Marland Kitchen.Marland Kitchen.S/D AVR/CABG x3  . Cancer Neg Hx        no ovarian colon breast  . Breast cancer Neg Hx     Social History:  reports that she quit smoking about 40 years ago. Her smoking use included cigarettes. She has never used smokeless tobacco. She reports that she does not drink alcohol or use drugs. She previously smoked 1/4-1/3 ppd x 4 years.  She stopped smoking in 1980.  She denies any exposure to radiation or toxins.  She is retired from [employer].  She lives in [place].  The patient is alone today.  Allergies:  Allergies  Allergen Reactions  . Sulfa Drugs Cross Reactors     Current Medications: Current Outpatient Medications  Medication Sig Dispense Refill  . albuterol (VENTOLIN HFA) 108 (90 Base) MCG/ACT inhaler Inhale 2 puffs into the lungs every 6 (six) hours as needed for wheezing or shortness of breath.    . ALPRAZolam (XANAX) 0.25 MG tablet Take 1 tablet (0.25 mg total) by mouth 2 (two) times daily as needed for anxiety. 30 tablet 0  . APPLE CIDER VINEGAR PO Take 1 capsule by mouth daily.     .Marland Kitchen  b complex vitamins tablet Take 1 tablet by mouth daily.    . bimatoprost (LUMIGAN) 0.03 % ophthalmic solution Place 1 drop into both eyes at bedtime.    [name] Pepper-Turmeric (TURMERIC CURCUMIN) 05-998 MG CAPS Take by mouth.    . budesonide (PULMICORT) 180 MCG/ACT inhaler Inhale 2 puffs into the lungs 2 (two) times daily.    . cetirizine (ZYRTEC) 10 MG tablet Take 10 mg by mouth daily.      . Cholecalciferol (VITAMIN D3 PO) Take 2,000 Units by mouth daily.    . Coenzyme Q10 (CO Q10) 200 MG CAPS Take 1 tablet by mouth.    . Cyanocobalamin (B-12) 2500 MCG SUBL Place 1 tablet under the tongue once a week.    Marland Kitchen EPINEPHrine 0.3 mg/0.3 mL IJ SOAJ injection See admin instructions.    Marland Kitchen MILK THISTLE PO Take 1 capsule by mouth daily.     . Probiotic Product (PROBIOTIC COMPLEX ACIDOPHILUS PO) Take 1 capsule by mouth daily.    Marland Kitchen PULMICORT FLEXHALER 90 MCG/ACT  inhaler Inhale 1-2 puffs into the lungs 2 (two) times daily.     . Red Yeast Rice 600 MG CAPS Take 1 capsule by mouth 2 (two) times a day.    . sodium chloride (OCEAN) 0.65 % SOLN nasal spray Place 1 spray into the nose as needed.      Marland Kitchen telmisartan (MICARDIS) 20 MG tablet TAKE 1 TABLET BY MOUTH EVERY DAY 90 tablet 0  . desonide (DESOWEN) 0.05 % ointment Apply topically as needed.      . Diclofenac Sodium CR 100 MG 24 hr tablet TAKE 1 TABLET BY MOUTH EVERY DAY 90 tablet 0   No current facility-administered medications for this visit.     Review of Systems  Constitutional: Negative.  Negative for diaphoresis, fever, malaise/fatigue and weight loss.       Feels "wonderful".  HENT: Negative.  Negative for congestion, ear pain, nosebleeds, sinus pain and sore throat.   Eyes: Negative.  Negative for blurred vision and double vision.  Respiratory: Negative.  Negative for cough, sputum production and shortness of breath.   Cardiovascular: Negative for chest pain, palpitations and leg swelling.  Gastrointestinal: Negative.  Negative for blood in stool, constipation, diarrhea, melena, nausea and vomiting.  Genitourinary: Negative.  Negative for frequency and urgency.  Musculoskeletal: Negative.  Negative for back pain, falls and myalgias.  Skin: Negative for rash.       Psoriasis resolving.  Neurological: Negative.  Negative for dizziness, sensory change, speech change, focal weakness, weakness and headaches.  Endo/Heme/Allergies: Positive for environmental allergies.  Psychiatric/Behavioral: Negative.  Negative for memory loss. The patient is not nervous/anxious and does not have insomnia.    Performance status (ECOG): 0  Vitals Blood pressure (!) 155/86, pulse 70, temperature 98.7 F (37.1 C), temperature source Oral, resp. rate 16, weight 227 lb 15.3 oz (103.4 kg), SpO2 99 %.   Physical Exam  Constitutional: She is oriented to person, place, and time. She appears well-developed and  well-nourished. No distress. Face mask in place.  HENT:  Head: Normocephalic and atraumatic.  Mouth/Throat: Oropharynx is clear and moist and mucous membranes are normal. No oral lesions.  Short styled brown hair.  Mask.  Eyes: Pupils are equal, round, and reactive to light. Conjunctivae and EOM are normal. No scleral icterus.  Glasses.  Brown eyes.  Neck: No JVD present.  Cardiovascular: Normal rate, regular rhythm and normal heart sounds. Exam reveals no gallop and no friction rub.  No murmur heard. Pulmonary/Chest: Effort normal and breath sounds normal. She has no wheezes. She has no rhonchi. She has no rales.  Abdominal: Soft. Normal appearance and bowel sounds are normal. She exhibits no mass. There is no hepatosplenomegaly. There is no abdominal tenderness. There is no guarding.  Musculoskeletal: Normal range of motion.        General: No edema.  Lymphadenopathy:    She has no cervical adenopathy.       Right cervical: No superficial cervical adenopathy present.   She has no axillary adenopathy.       Right: No inguinal adenopathy present.       Left: No inguinal adenopathy present.  Neurological: She is alert and oriented to person, place, and time.  Skin: Skin is intact. No bruising, no lesion and no rash noted. No pallor.  Psychiatric: She has a normal mood and affect.  Nursing note and vitals reviewed.    No visits with results within 3 Day(s) from this visit.  Latest known visit with results is:  Lab on 12/26/2017  Component Date Value Ref Range Status  . Uric Acid, Serum 12/26/2017 6.0  2.4 - 7.0 mg/dL Final  . TSH 96/04/5407 5.65* 0.35 - 4.50 uIU/mL Final    Assessment:  Shatisha Muilenburg is a 73 y.o. female with moderately severe plaque psoriasis.  She has been treated with topical steroids including desonide, fluocinonide, and Olux (clobetasol) for her scalp.  She received Ilumya SQ on 12/13/2018, 01/10/2018, 04/17/2018, and 07/10/2018.  Quantiferon gold testing was  negative.  She is up-to-date on her vaccines.  Symptomatically, she is doing well.  Psoriasis is resolving.  Plan: 1.   Moderately severe plaque psoriasis             Clinically, she is doing well.  Psoriasis is resolving.  Ilumya today.  Patient will be receiving future treatments at [facility] in [place]. 2.   Follow-up as scheduled with dermatology in 12/2018.   I discussed the assessment and treatment plan with the patient.  The patient was provided an opportunity to ask questions and all were answered.  The patient agreed with the plan and demonstrated an understanding of the instructions.  The patient was advised to call back if the symptoms worsen or if the condition fails to improve as anticipated.   Rosey Bath, MD, PhD    10/02/2018, 3:35 PM  I, Mal Misty, am acting as Neurosurgeon for [employer]. Merlene Pulling, MD, PhD.  I, Melissa C. Merlene Pulling, MD, have reviewed the above documentation for accuracy and completeness, and I agree with the above.

## 2018-10-02 NOTE — Progress Notes (Signed)
Patient here for follow up. Denies any concerns.  

## 2018-10-08 ENCOUNTER — Other Ambulatory Visit: Payer: Self-pay | Admitting: Internal Medicine

## 2018-10-09 ENCOUNTER — Encounter: Payer: Self-pay | Admitting: Hematology and Oncology

## 2018-10-10 ENCOUNTER — Other Ambulatory Visit: Payer: Self-pay | Admitting: Internal Medicine

## 2018-10-23 ENCOUNTER — Other Ambulatory Visit: Payer: Medicare Other

## 2018-10-30 ENCOUNTER — Ambulatory Visit: Payer: Medicare Other | Admitting: Internal Medicine

## 2018-11-06 ENCOUNTER — Other Ambulatory Visit: Payer: Self-pay

## 2018-11-06 ENCOUNTER — Other Ambulatory Visit: Payer: Self-pay | Admitting: Internal Medicine

## 2018-11-06 ENCOUNTER — Other Ambulatory Visit (INDEPENDENT_AMBULATORY_CARE_PROVIDER_SITE_OTHER): Payer: Medicare Other

## 2018-11-06 DIAGNOSIS — K760 Fatty (change of) liver, not elsewhere classified: Secondary | ICD-10-CM | POA: Diagnosis not present

## 2018-11-06 DIAGNOSIS — E034 Atrophy of thyroid (acquired): Secondary | ICD-10-CM

## 2018-11-06 DIAGNOSIS — E538 Deficiency of other specified B group vitamins: Secondary | ICD-10-CM

## 2018-11-06 DIAGNOSIS — E1149 Type 2 diabetes mellitus with other diabetic neurological complication: Secondary | ICD-10-CM

## 2018-11-06 DIAGNOSIS — Z1159 Encounter for screening for other viral diseases: Secondary | ICD-10-CM

## 2018-11-06 LAB — LIPID PANEL
Cholesterol: 185 mg/dL (ref 0–200)
HDL: 44.6 mg/dL (ref >39.00)
LDL Cholesterol: 110 mg/dL — ABNORMAL HIGH (ref 0–99)
NonHDL: 140.53
Total CHOL/HDL Ratio: 4
Triglycerides: 154 mg/dL — ABNORMAL HIGH (ref 0.0–149.0)
VLDL: 30.8 mg/dL (ref 0.0–40.0)

## 2018-11-06 LAB — COMPREHENSIVE METABOLIC PANEL
ALT: 40 U/L — ABNORMAL HIGH (ref 0–35)
AST: 33 U/L (ref 0–37)
Albumin: 3.9 g/dL (ref 3.5–5.2)
Alkaline Phosphatase: 68 U/L (ref 39–117)
BUN: 9 mg/dL (ref 6–23)
CO2: 26 mEq/L (ref 19–32)
Calcium: 9 mg/dL (ref 8.4–10.5)
Chloride: 103 mEq/L (ref 96–112)
Creatinine, Ser: 0.83 mg/dL (ref 0.40–1.20)
GFR: 67.18 mL/min (ref >60.00)
Glucose, Bld: 138 mg/dL — ABNORMAL HIGH (ref 70–99)
Potassium: 3.9 mEq/L (ref 3.5–5.1)
Sodium: 138 mEq/L (ref 135–145)
Total Bilirubin: 1 mg/dL (ref 0.2–1.2)
Total Protein: 7.1 g/dL (ref 6.0–8.3)

## 2018-11-06 LAB — VITAMIN B12: Vitamin B-12: 389 pg/mL (ref 211–911)

## 2018-11-06 LAB — TSH: TSH: 4.17 u[IU]/mL (ref 0.35–4.50)

## 2018-11-07 LAB — HEPATITIS C ANTIBODY
Hepatitis C Ab: NONREACTIVE
SIGNAL TO CUT-OFF: 0.02 (ref <1.00)

## 2018-11-11 ENCOUNTER — Ambulatory Visit (INDEPENDENT_AMBULATORY_CARE_PROVIDER_SITE_OTHER): Payer: Medicare Other | Admitting: Internal Medicine

## 2018-11-11 ENCOUNTER — Other Ambulatory Visit: Payer: Self-pay

## 2018-11-11 ENCOUNTER — Encounter: Payer: Self-pay | Admitting: Internal Medicine

## 2018-11-11 VITALS — BP 166/98 | HR 91 | Temp 98.2°F | Resp 16 | Ht 66.0 in | Wt 227.8 lb

## 2018-11-11 DIAGNOSIS — I739 Peripheral vascular disease, unspecified: Secondary | ICD-10-CM

## 2018-11-11 DIAGNOSIS — K760 Fatty (change of) liver, not elsewhere classified: Secondary | ICD-10-CM

## 2018-11-11 DIAGNOSIS — I10 Essential (primary) hypertension: Secondary | ICD-10-CM

## 2018-11-11 DIAGNOSIS — E1149 Type 2 diabetes mellitus with other diabetic neurological complication: Secondary | ICD-10-CM

## 2018-11-11 DIAGNOSIS — Z0000 Encounter for general adult medical examination without abnormal findings: Secondary | ICD-10-CM

## 2018-11-11 LAB — POCT GLYCOSYLATED HEMOGLOBIN (HGB A1C): Hemoglobin A1C: 6.5 % — AB (ref 4.0–5.6)

## 2018-11-11 MED ORDER — ALPRAZOLAM 0.25 MG PO TABS: 0.2500 mg | ORAL_TABLET | Freq: Two times a day (BID) | ORAL | 0 refills | Status: DC | PRN

## 2018-11-11 NOTE — Progress Notes (Signed)
Patient ID: [name], female    DOB: [birth year]  Age: 73 y.o. MRN: [id number]  The patient is here for  Follow up and management of other chronic and acute problems.   The risk factors are reflected in the social history.  The roster of all physicians providing medical care to patient - is listed in the Snapshot section of the chart.  Activities of daily living:  The patient is 100% independent in all ADLs: dressing, toileting, feeding as well as independent mobility  Home safety : The patient has smoke detectors in the home. They wear seatbelts.  There are no firearms at home. There is no violence in the home.   There is no risks for hepatitis, STDs or HIV. There is no   history of blood transfusion. They have no travel history to infectious disease endemic areas of the world.  The patient has seen their dentist in the last six month. They have seen their eye doctor in the last year. They admit to slight hearing difficulty with regard to whispered voices and some television programs.  They have deferred audiologic testing in the last year.  They do not  have excessive sun exposure. Discussed the need for sun protection: hats, long sleeves and use of sunscreen if there is significant sun exposure.   Diet: the importance of a healthy diet is discussed. They do have a healthy diet.  The benefits of regular aerobic exercise were discussed. She walks 4 times per week ,  20 minutes.   Depression screen: there are no signs or vegative symptoms of depression- irritability, change in appetite, anhedonia, sadness/tearfullness.  Cognitive assessment: the patient manages all their financial and personal affairs and is actively engaged. They could relate day,date,year and events; recalled 2/3 objects at 3 minutes; performed clock-face test normally.  The following portions of the patient's history were reviewed and updated as appropriate: allergies, current medications, past family history, past medical  history,  past surgical history, past social history  and problem list.  Visual acuity was not assessed per patient preference since she has regular follow up with her ophthalmologist. Hearing and body mass index were assessed and reviewed.   During the course of the visit the patient was educated and counseled about appropriate screening and preventive services including : fall prevention , diabetes screening, nutrition counseling, colorectal cancer screening, and recommended immunizations.    CC: The primary encounter diagnosis was Type 2 diabetes mellitus with neurological complications (HCC). Diagnoses of Fatty liver, Essential hypertension, PAD (peripheral artery disease) (HCC), Morbid obesity (HCC), and Routine adult health maintenance were also pertinent to this visit.   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  Not walking due to foot issue, MAKING HER DEPRESSED and resentful   Gaining weight .  Eats 2 bites"  Of everything can't figure out why she is gaining weight when she often skips lunch , eats only twice daily   Feels overwhelmed and burned out   HTN:  Home BPs 130/70 on home [employer] machine   Mood worsening does not want more medication    History Beyonka has a past medical history of Asthma, Asthma due to environmental allergies, Cataract, Cellulitis, Diabetes mellitus, Edema leg, Endometrial hyperplasia, External hemorrhoid, Fatty liver, Glaucoma, Hepatic cyst, Hypertension, IBS (irritable bowel syndrome), Murmur, cardiac, Neuropathy, OSA on CPAP, Osteoarthritis, Psoriasis, and Sleep apnea.   She has a past  surgical history that includes Hysteroscopy w/D&C (2007); BONE DENSITY TEST (2006); ONE VAGINAL DELIVERY; Vaginal hysterectomy (2010); and Umbilical hernia repair (2010).   Her family history includes Heart disease (age of onset: 5974) in her father; Mental illness in her mother.She reports that  she quit smoking about 40 years ago. Her smoking use included cigarettes. She has never used smokeless tobacco. She reports that she does not drink alcohol or use drugs.  Outpatient Medications Prior to Visit  Medication Sig Dispense Refill  . albuterol (VENTOLIN HFA) 108 (90 Base) MCG/ACT inhaler Inhale 2 puffs into the lungs every 6 (six) hours as needed for wheezing or shortness of breath.    Marland Kitchen. b complex vitamins tablet Take 1 tablet by mouth daily.    . bimatoprost (LUMIGAN) 0.03 % ophthalmic solution Place 1 drop into both eyes at bedtime.    . budesonide (PULMICORT) 180 MCG/ACT inhaler Inhale 2 puffs into the lungs 2 (two) times daily.    . cetirizine (ZYRTEC) 10 MG tablet Take 10 mg by mouth daily.      . Cholecalciferol (VITAMIN D3 PO) Take 2,000 Units by mouth daily.    . Coenzyme Q10 (CO Q10) 200 MG CAPS Take 1 tablet by mouth.    . Cyanocobalamin (B-12) 2500 MCG SUBL Place 1 tablet under the tongue once a week.    . Diclofenac Sodium CR 100 MG 24 hr tablet TAKE 1 TABLET BY MOUTH EVERY DAY 90 tablet 0  . EPINEPHrine 0.3 mg/0.3 mL IJ SOAJ injection See admin instructions.    Marland Kitchen. MILK THISTLE PO Take 1 capsule by mouth daily.     . Probiotic Product (PROBIOTIC COMPLEX ACIDOPHILUS PO) Take 1 capsule by mouth daily.    . Red Yeast Rice 600 MG CAPS Take 1 capsule by mouth 2 (two) times a day.    . sodium chloride (OCEAN) 0.65 % SOLN nasal spray Place 1 spray into the nose as needed.      Marland Kitchen. telmisartan (MICARDIS) 20 MG tablet TAKE 1 TABLET BY MOUTH EVERY DAY 90 tablet 3  . tildrakizumab-asmn (ILUMYA) 100 MG/ML subcutaneous injection Inject 100 mg into the skin every 3 (three) months.    . ALPRAZolam (XANAX) 0.25 MG tablet Take 1 tablet (0.25 mg total) by mouth 2 (two) times daily as needed for anxiety. 30 tablet 0  . APPLE CIDER VINEGAR PO Take 1 capsule by mouth daily.     Vedia Coffer. Black Pepper-Turmeric (TURMERIC CURCUMIN) 05-998 MG CAPS Take by mouth.    . desonide (DESOWEN) 0.05 % ointment Apply  topically as needed.      Marland Kitchen. PULMICORT FLEXHALER 90 MCG/ACT inhaler Inhale 1-2 puffs into the lungs 2 (two) times daily.      No facility-administered medications prior to visit.     Review of Systems   Patient denies headache, fevers, malaise, unintentional weight loss, skin rash, eye pain, sinus congestion and sinus pain, sore throat, dysphagia,  hemoptysis , cough, dyspnea, wheezing, chest pain, palpitations, orthopnea, edema, abdominal pain, nausea, melena, diarrhea, constipation, flank pain, dysuria, hematuria, urinary  Frequency, nocturia, numbness, tingling, seizures,  Focal weakness, Loss of consciousness,  Tremor, insomnia, depression, anxiety, and suicidal ideation.     Objective:  BP (!) 166/98 (BP Location: Left Arm, Patient Position: Sitting, Cuff Size: Large)   Pulse 91   Temp 98.2 F (36.8 C) (Temporal)   Resp 16   Ht 5\' 6"  (1.676 m)   Wt 227 lb 12.8 oz (103.3 kg)   SpO2 97%  BMI 36.77 kg/m   Physical Exam   General appearance: alert, cooperative and appears stated age Ears: normal TM's and external ear canals both ears Throat: lips, mucosa, and tongue normal; teeth and gums normal Neck: no adenopathy, no carotid bruit, supple, symmetrical, trachea midline and thyroid not enlarged, symmetric, no tenderness/mass/nodules Back: symmetric, no curvature. ROM normal. No CVA tenderness. Lungs: clear to auscultation bilaterally Heart: regular rate and rhythm, S1, S2 normal, no murmur, click, rub or gallop Abdomen: soft, non-tender; bowel sounds normal; no masses,  no organomegaly Pulses: 2+ and symmetric Skin: Skin color, texture, turgor normal. No rashes or lesions Lymph nodes: Cervical, supraclavicular, and axillary nodes normal.    Assessment & Plan:   Problem List Items Addressed This Visit      Unprioritized   Fatty liver    Supported by CT  serologies negative for autoimmune causes of hepatitis.  Last liver enzymes were  elevated and all modifiable risk  factors were addressed again with patient today including obesity, metabolic syndrome and hyperlipidemia .  Hep A/B vaccine series has been given. She has bben taking mild thistle and defers use of statin and metformin.   Lab Results  Component Value Date   ALT 40 (H) 11/06/2018   AST 33 11/06/2018   ALKPHOS 68 11/06/2018   BILITOT 1.0 11/06/2018           Type 2 diabetes mellitus with neurological complications (HCC) - Primary    Fasting glucoses have been well under 125 but A1c has risen to 6.5  She has had her annual eye exam .   she is now taking a baby aspirin  . Not on Statin (refuses statin therapy) ,  But ARB advised given new onset hypertension with LVH noted on prior ECHO    Low GI/Mediterranean  diet and exercise recommended   Lab Results  Component Value Date   HGBA1C 6.5 (A) 11/11/2018   Lab Results  Component Value Date   MICROALBUR 1.6 03/19/2017            Relevant Orders   POCT HgB A1C (Completed)   Comprehensive metabolic panel   Hemoglobin A1c   Lipid panel   Hypertension    Home readings using a wrist cuff are < 130/80.  Advised to return for RN visit to check home machine       PAD (peripheral artery disease) (HCC)    Noted on carotid ultrasounds and on ABIs done during lifeline Screening  ABI on right leg was 0.85  Statin advised but deferred by patient .  She is asymptomatic       Routine adult health maintenance    age appropriate education and counseling updated, referrals for preventative services and immunizations addressed, dietary and smoking counseling addressed, most recent labs reviewed.  I have personally reviewed and have noted:  1) the patient's medical and social history 2) The pt's use of alcohol, tobacco, and illicit drugs 3) The patient's current medications and supplements 4) Functional ability including ADL's, fall risk, home safety risk, hearing and visual impairment 5) Diet and physical activities 6) Evidence for  depression or mood disorder 7) The patient's height, weight, and BMI have been recorded in the chart  I have made referrals, and provided counseling and education based on review of the above      Morbid obesity (HCC)      I have discontinued Liborio Nixon Steinhoff's desonide, APPLE CIDER VINEGAR PO, and Turmeric Curcumin. I am also having her maintain  her cetirizine, sodium chloride, Probiotic Product (PROBIOTIC COMPLEX ACIDOPHILUS PO), b complex vitamins, bimatoprost, albuterol, budesonide, MILK THISTLE PO, Co Q10, Cholecalciferol (VITAMIN D3 PO), Red Yeast Rice, B-12, EPINEPHrine, Diclofenac Sodium CR, telmisartan, tildrakizumab-asmn, and ALPRAZolam.  Meds ordered this encounter  Medications  . ALPRAZolam (XANAX) 0.25 MG tablet    Sig: Take 1 tablet (0.25 mg total) by mouth 2 (two) times daily as needed for anxiety.    Dispense:  30 tablet    Refill:  0    Medications Discontinued During This Encounter  Medication Reason  . APPLE CIDER VINEGAR PO Patient has not taken in last 30 days  . Black Pepper-Turmeric (TURMERIC CURCUMIN) 05-998 MG CAPS Patient has not taken in last 30 days  . desonide (DESOWEN) 0.05 % ointment Patient has not taken in last 30 days  . PULMICORT FLEXHALER 90 MCG/ACT inhaler Duplicate  . ALPRAZolam (XANAX) 0.25 MG tablet Reorder    Follow-up: No follow-ups on file.   Crecencio Mc, MD

## 2018-11-11 NOTE — Assessment & Plan Note (Signed)
Noted on carotid ultrasounds and on ABIs done during lif[facility] Screening  ABI on right leg was 0.85  Statin advised but deferred by patient .  She is asymptomatic

## 2018-11-11 NOTE — Patient Instructions (Addendum)
You NEED A VACATION!!  I recommend repeating your fasting labs in 6 months.     Nonalcoholic Fatty Liver Disease Diet, Adult Nonalcoholic fatty liver disease is a condition that causes fat to build up in and around the liver. The disease makes it harder for the liver to work the way that it should. Following a healthy diet can help to keep nonalcoholic fatty liver disease under control. It can also help to prevent or improve conditions that are associated with the disease, such as heart disease, diabetes, high blood pressure, and abnormal cholesterol levels. Along with regular exercise, this diet:  Promotes weight loss.  Helps to control blood sugar levels.  Helps to improve the way that the body uses insulin. What are tips for following this plan? Reading food labels Always check food labels for:  The amount of saturated fat in a food. You should limit your intake of saturated fat. Saturated fat is found in foods that come from animals, including meat and dairy products such as butter, cheese, and whole milk.  The amount of fiber in a food. You should choose high-fiber foods such as fruits, vegetables, and whole grains. Try to get 25-30 grams (g) of fiber a day.  Cooking  When cooking, use heart-healthy oils that are high in monounsaturated fats. These include olive oil, canola oil, and avocado oil.  Limit frying or deep-frying foods. Cook foods using healthy methods such as baking, boiling, steaming, and grilling instead. Meal planning  You may want to keep track of how many calories you take in. Eating the right amount of calories will help you achieve a healthy weight. Meeting with a registered dietitian can help you get started.  Limit how often you eat takeout and fast food. These foods are usually very high in fat, salt, and sugar.  Use the glycemic index (GI) to plan your meals. The index tells you how quickly a food will raise your blood sugar. Choose low-GI foods (GI less  than 55). These foods take a longer time to raise blood sugar. A registered dietitian can help you identify foods lower on the GI scale. Lifestyle  You may want to follow a Mediterranean diet. This diet includes a lot of vegetables, lean meats or fish, whole grains, fruits, and healthy oils and fats. What foods can I eat?  Fruits Bananas. Apples. Oranges. Grapes. Papaya. Mango. Pomegranate. Kiwi. Grapefruit. Cherries. Vegetables Lettuce. Spinach. Peas. Beets. Cauliflower. Cabbage. Broccoli. Carrots. Tomatoes. Squash. Eggplant. Herbs. Peppers. Onions. Cucumbers. Brussels sprouts. Yams and sweet potatoes. Beans. Lentils. Grains Whole wheat or whole-grain foods, including breads, crackers, cereals, and pasta. Stone-ground whole wheat. Unsweetened oatmeal. Bulgur. Barley. Quinoa. Brown or wild rice. Corn or whole wheat flour tortillas. Meats and other proteins Lean meats. Poultry. Tofu. Seafood and shellfish. Dairy Low-fat or fat-free dairy products, such as yogurt, cottage cheese, or cheese. Beverages Water. Sugar-free drinks. Tea. Coffee. Low-fat or skim milk. Milk alternatives, such as soy or almond milk. Real fruit juice. Fats and oils Avocado. Canola or olive oil. Nuts and nut butters. Seeds. Seasonings and condiments Mustard. Relish. Low-fat, low-sugar ketchup and barbecue sauce. Low-fat or fat-free mayonnaise. Sweets and desserts Sugar-free sweets. The items listed above may not be a complete list of foods and beverages you can eat. Contact a dietitian for more information. What foods should I limit or avoid? Meats and other proteins Limit red meat to 1-2 times a week. Dairy [employer]. Fats and oils Palm oil and coconut oil. Fried foods. Other  foods Processed foods. Foods that contain a lot of salt or sodium. Sweets and desserts Sweets that contain sugar. Beverages Sweetened drinks, such as sweet tea, milkshakes, iced sweet drinks, and sodas. Alcohol. The items listed  above may not be a complete list of foods and beverages you should avoid. Contact a dietitian for more information. Where to find more information The [employer] of Diabetes and Digestive and Kidney Diseases: [email or web address] Summary  Nonalcoholic fatty liver disease is a condition that causes fat to build up in and around the liver.  Following a healthy diet can help to keep nonalcoholic fatty liver disease under control. Your diet should be rich in fruits, vegetables, whole grains, and lean proteins.  Limit your intake of saturated fat. Saturated fat is found in foods that come from animals, including meat and dairy products such as butter, cheese, and whole milk.  This diet promotes weight loss, helps to control blood sugar levels, and helps to improve the way that the body uses insulin. This information is not intended to replace advice given to you by your health care provider. Make sure you discuss any questions you have with your health care provider. Document Released: 05/19/2014 Document Revised: 04/26/2018 Document Reviewed: 01/24/2018 Elsevier Patient Education  2020 [employer].

## 2018-11-11 NOTE — Assessment & Plan Note (Signed)
Fasting glucoses have been well under 125 but A1c has risen to 6.5  She has had her annual eye exam .   she is now taking a baby aspirin  . Not on Statin (refuses statin therapy) ,  But [facility] advised given new onset hypertension with LVH noted on prior ECHO    Low GI/Mediterranean  diet and exercise recommended   Lab Results  Component Value Date   HGBA1C 6.5 (A) 11/11/2018   Lab Results  Component Value Date   MICROALBUR 1.6 03/19/2017

## 2018-11-11 NOTE — Assessment & Plan Note (Signed)
Home readings using a wrist cuff are < 130/80.  Advised to return for RN visit to check home machine

## 2018-11-11 NOTE — Assessment & Plan Note (Signed)
Supported by CT  serologies negative for autoimmune causes of hepatitis.  Last liver enzymes were  elevated and all modifiable risk factors were addressed again with patient today including obesity, metabolic syndrome and hyperlipidemia .  Hep A/B vaccine series has been given. She has bben taking mild thistle and defers use of statin and metformin.   Lab Results  Component Value Date   ALT 40 (H) 11/06/2018   AST 33 11/06/2018   ALKPHOS 68 11/06/2018   BILITOT 1.0 11/06/2018

## 2018-11-11 NOTE — Assessment & Plan Note (Signed)

## 2018-11-12 ENCOUNTER — Other Ambulatory Visit: Payer: Self-pay | Admitting: Internal Medicine

## 2018-11-12 DIAGNOSIS — Z1231 Encounter for screening mammogram for malignant neoplasm of breast: Secondary | ICD-10-CM

## 2018-11-21 ENCOUNTER — Other Ambulatory Visit: Payer: Self-pay

## 2018-11-21 ENCOUNTER — Ambulatory Visit (INDEPENDENT_AMBULATORY_CARE_PROVIDER_SITE_OTHER): Payer: Medicare Other

## 2018-11-21 VITALS — BP 152/80 | HR 75

## 2018-11-21 DIAGNOSIS — I10 Essential (primary) hypertension: Secondary | ICD-10-CM

## 2018-11-21 NOTE — Progress Notes (Addendum)
Patient presented today for nurse visit BP check per MD order from 11/11/18.   Patient reports compliance with prescribed BP medications: YES  Last dose of BP medication: today at 7 am.  Readings in office:  BP-152/80; P-75  Patient brought in her home bp machine.  Patient bp readings from home machine was 151/80 and pulse 74.  Patient also provided bp log from home readings.  One copy made for scan and one copy placed in PCP's quick sign folder for review.  BP Readings from home below:  10/27- 156/88 10/28- 123/77 (before meds) 10/30- 120/78 (before meds) 11/1-  154/85 11/2-  153/82 11/2-  135/75 11/3-  156/79 11/5-  125/71 (before meds)  BP Readings from Last 3 Encounters:  11/21/18 (!) 152/80  11/11/18 (!) 166/98  10/02/18 (!) 155/86   Pulse Readings from Last 3 Encounters:  11/21/18 75  11/11/18 91  10/02/18 70    [name], CMA     I have reviewed the above information and agree with above.  Patient 's home machine appears to be accurate.  She should continue  Talking telmisartan 20 mg taken (in the evening, not morning) notify me if readings are > 130/80  On a consistent basis so I can adjust medicaitons.  Regards,   Deborra Medina, MD      Deborra Medina, MD

## 2019-01-02 ENCOUNTER — Ambulatory Visit: Payer: Medicare Other

## 2019-01-06 ENCOUNTER — Ambulatory Visit: Payer: Medicare Other | Admitting: Podiatry

## 2019-04-14 ENCOUNTER — Ambulatory Visit: Payer: Medicare Other | Admitting: Podiatry

## 2019-05-05 ENCOUNTER — Ambulatory Visit: Payer: Medicare Other

## 2019-05-13 ENCOUNTER — Other Ambulatory Visit: Payer: Self-pay

## 2019-05-13 ENCOUNTER — Other Ambulatory Visit (INDEPENDENT_AMBULATORY_CARE_PROVIDER_SITE_OTHER): Payer: Medicare Other

## 2019-05-13 DIAGNOSIS — E1149 Type 2 diabetes mellitus with other diabetic neurological complication: Secondary | ICD-10-CM

## 2019-05-13 LAB — LIPID PANEL
Cholesterol: 191 mg/dL (ref 0–200)
HDL: 45.9 mg/dL (ref >39.00)
LDL Cholesterol: 117 mg/dL — ABNORMAL HIGH (ref 0–99)
NonHDL: 145.52
Total CHOL/HDL Ratio: 4
Triglycerides: 143 mg/dL (ref 0.0–149.0)
VLDL: 28.6 mg/dL (ref 0.0–40.0)

## 2019-05-13 LAB — COMPREHENSIVE METABOLIC PANEL
ALT: 39 U/L — ABNORMAL HIGH (ref 0–35)
AST: 33 U/L (ref 0–37)
Albumin: 3.9 g/dL (ref 3.5–5.2)
Alkaline Phosphatase: 74 U/L (ref 39–117)
BUN: 13 mg/dL (ref 6–23)
CO2: 29 mEq/L (ref 19–32)
Calcium: 8.8 mg/dL (ref 8.4–10.5)
Chloride: 100 mEq/L (ref 96–112)
Creatinine, Ser: 0.84 mg/dL (ref 0.40–1.20)
GFR: 66.16 mL/min (ref >60.00)
Glucose, Bld: 139 mg/dL — ABNORMAL HIGH (ref 70–99)
Potassium: 4.4 mEq/L (ref 3.5–5.1)
Sodium: 136 mEq/L (ref 135–145)
Total Bilirubin: 1.2 mg/dL (ref 0.2–1.2)
Total Protein: 6.6 g/dL (ref 6.0–8.3)

## 2019-05-13 LAB — HEMOGLOBIN A1C: Hgb A1c MFr Bld: 6.6 % — ABNORMAL HIGH (ref 4.6–6.5)

## 2019-05-15 ENCOUNTER — Other Ambulatory Visit: Payer: Self-pay

## 2019-05-15 ENCOUNTER — Encounter: Payer: Self-pay | Admitting: Internal Medicine

## 2019-05-15 ENCOUNTER — Ambulatory Visit (INDEPENDENT_AMBULATORY_CARE_PROVIDER_SITE_OTHER): Payer: Medicare Other | Admitting: Internal Medicine

## 2019-05-15 VITALS — BP 140/98 | HR 77 | Temp 96.6°F | Resp 15 | Ht 66.0 in | Wt 227.0 lb

## 2019-05-15 DIAGNOSIS — R200 Anesthesia of skin: Secondary | ICD-10-CM | POA: Diagnosis not present

## 2019-05-15 DIAGNOSIS — G4733 Obstructive sleep apnea (adult) (pediatric): Secondary | ICD-10-CM

## 2019-05-15 DIAGNOSIS — R413 Other amnesia: Secondary | ICD-10-CM

## 2019-05-15 DIAGNOSIS — I10 Essential (primary) hypertension: Secondary | ICD-10-CM | POA: Diagnosis not present

## 2019-05-15 DIAGNOSIS — R202 Paresthesia of skin: Secondary | ICD-10-CM

## 2019-05-15 DIAGNOSIS — G5790 Unspecified mononeuropathy of unspecified lower limb: Secondary | ICD-10-CM

## 2019-05-15 DIAGNOSIS — R251 Tremor, unspecified: Secondary | ICD-10-CM

## 2019-05-15 DIAGNOSIS — R5383 Other fatigue: Secondary | ICD-10-CM

## 2019-05-15 DIAGNOSIS — F410 Panic disorder [episodic paroxysmal anxiety] without agoraphobia: Secondary | ICD-10-CM

## 2019-05-15 DIAGNOSIS — Z9989 Dependence on other enabling machines and devices: Secondary | ICD-10-CM

## 2019-05-15 DIAGNOSIS — F430 Acute stress reaction: Secondary | ICD-10-CM

## 2019-05-15 DIAGNOSIS — I739 Peripheral vascular disease, unspecified: Secondary | ICD-10-CM

## 2019-05-15 DIAGNOSIS — E1149 Type 2 diabetes mellitus with other diabetic neurological complication: Secondary | ICD-10-CM

## 2019-05-15 DIAGNOSIS — E538 Deficiency of other specified B group vitamins: Secondary | ICD-10-CM

## 2019-05-15 DIAGNOSIS — E039 Hypothyroidism, unspecified: Secondary | ICD-10-CM

## 2019-05-15 DIAGNOSIS — K760 Fatty (change of) liver, not elsewhere classified: Secondary | ICD-10-CM

## 2019-05-15 NOTE — Assessment & Plan Note (Signed)
She is opposed to taking more mediations.  Readings at home have been lower with systolics in the 100's.

## 2019-05-15 NOTE — Patient Instructions (Signed)
Your diabetes remains under excellent control  On diet aloe.  your cholesterol is still borderline. I recommend a trial at some point of statins to lower your cholesterol and treat your fatty liver and carotid stenosis .  Return for the additional labs at your leisure  'Neurology referral to Dr Sherryll Burger in progress Vascular referral for carotid ultrasound alos in progress     Please continue your current medications but consider a trial of simvastatin If you are willing,  Let me know and I will send it to your phamracy  . return in 6 months for follow up on diabetes and make sure you are seeing your eye doctor at least once a year for a dilated retina exam to monitor for diabetic retinopathy,. changes that can lead to blindness .

## 2019-05-15 NOTE — Progress Notes (Addendum)
Subjective:  Patient ID: [name], female    DOB: May 07, 1945  Age: 74 y.o. MRN: [id number]  CC: The primary encounter diagnosis was B12 deficiency. Diagnoses of White coat syndrome with diagnosis of hypertension, Numbness and tingling in left arm, Fatigue, unspecified type, PAD (peripheral artery disease) (HCC), Fatty liver, Hypothyroidism (acquired), OSA on CPAP, Panic attack as reaction to stress, Neuropathy of foot, unspecified laterality, Tremor of left hand, and Memory loss, short term were also pertinent to this visit.  HPI Marquita Lias presents for 6 month follow up on multiple conditions, including white coat hypertension , type 2 DM obesity with OSA. Fatty liver, and psoriasis.   This visit occurred during the SARS-CoV-2 public health emergency.  Safety protocols were in place, including screening questions prior to the visit, additional usage of staff PPE, and extensive cleaning of exam room while observing appropriate contact time as indicated for disinfecting solutions.    Patient has received both doses of the Moderna COVID 19 vaccine without complications.  Patient continues to mask when outside of the home except when walking in yard or at safe distances from others .  Patient denies any change in mood or development of unhealthy behaviors resuting from the pandemic's restriction of activities and socialization.    Hypertension: patient checks blood pressure twice weekly at home.  Readings have been for the most part < 130/80 In the morning,  140 to 150/80 in the evening . Patient is following a reduce salt diet most days and is taking medications as prescribed.  Has white coat hypertension .  Was notified by [facility] that telmisartan causes back pain so she does not trust drug now  OSA:  Wearing her  CPAP nightly.    Short term memory getting worse ,  Neuropathy in feet causing balance issues and fear of falling.  No drop foot. , but  Some near misses from not picking foot up and  tripping on rug.   Depression screen. Has become more sedentary,  Has lost interest in cooking..    Treated for  bronchitis in Feb 2020  By NP Jason Nest, and thinks it may have been COVID 19 (PRE TESTING); worried that she  has neurologic sequelae.  Has a slight tremor of left hand, also having numbness in right hand.    Anxiety:  Using xanax prn panic attack twice per month. .  Doesn't want therapy or medications.  Family matters more contributory than COVID restrictions.  Grandchildren worry her,  National events worrying her.   Psoriasis controlled  By dermatology with infusions every 3 months of Ilumya.    Has been noticing that she can her hear heart beat in left ear  Had a life screening 3 years ago, showing mild to moderate placque in left carotid. Has not had vascular follow up  Outpatient Medications Prior to Visit  Medication Sig Dispense Refill  . albuterol (VENTOLIN HFA) 108 (90 Base) MCG/ACT inhaler Inhale 2 puffs into the lungs every 6 (six) hours as needed for wheezing or shortness of breath.    . ALPRAZolam (XANAX) 0.25 MG tablet Take 1 tablet (0.25 mg total) by mouth 2 (two) times daily as needed for anxiety. 30 tablet 0  . b complex vitamins tablet Take 1 tablet by mouth daily.    . bimatoprost (LUMIGAN) 0.03 % ophthalmic solution Place 1 drop into both eyes at bedtime.    . budesonide (PULMICORT) 180 MCG/ACT inhaler Inhale 2 puffs into the lungs 2 (two) times daily.    Marland Kitchen  cetirizine (ZYRTEC) 10 MG tablet Take 10 mg by mouth daily.      . Cholecalciferol (VITAMIN D3 PO) Take 2,000 Units by mouth daily.    . Coenzyme Q10 (CO Q10) 200 MG CAPS Take 1 tablet by mouth.    . Cyanocobalamin (B-12) 2500 MCG SUBL Place 1 tablet under the tongue once a week.    . Diclofenac Sodium CR 100 MG 24 hr tablet TAKE 1 TABLET BY MOUTH EVERY DAY 90 tablet 0  . EPINEPHrine 0.3 mg/0.3 mL IJ SOAJ injection See admin instructions.    Marland Kitchen MILK THISTLE PO Take 1 capsule by mouth daily.     . Probiotic  Product (PROBIOTIC COMPLEX ACIDOPHILUS PO) Take 1 capsule by mouth daily.    . Red Yeast Rice 600 MG CAPS Take 1 capsule by mouth 2 (two) times a day.    . sodium chloride (OCEAN) 0.65 % SOLN nasal spray Place 1 spray into the nose as needed.      Marland Kitchen telmisartan (MICARDIS) 20 MG tablet TAKE 1 TABLET BY MOUTH EVERY DAY 90 tablet 3  . tildrakizumab-asmn (ILUMYA) 100 MG/ML subcutaneous injection Inject 100 mg into the skin every 3 (three) months.     No facility-administered medications prior to visit.    Review of Systems;  Patient denies headache, fevers, malaise, unintentional weight loss, skin rash, eye pain, sinus congestion and sinus pain, sore throat, dysphagia,  hemoptysis , cough, dyspnea, wheezing, chest pain, palpitations, orthopnea, edema, abdominal pain, nausea, melena, diarrhea, constipation, flank pain, dysuria, hematuria, urinary  Frequency, nocturia, numbness, tingling, seizures,  Focal weakness, Loss of consciousness,  Tremor, insomnia, depression, anxiety, and suicidal ideation.      Objective:  BP (!) 140/98 (BP Location: Left Arm, Patient Position: Sitting, Cuff Size: Normal)   Pulse 77   Temp (!) 96.6 F (35.9 C) (Temporal)   Resp 15   Ht 5\' 6"  (1.676 m)   Wt 227 lb (103 kg)   SpO2 97%   BMI 36.64 kg/m   BP Readings from Last 3 Encounters:  05/15/19 (!) 140/98  11/21/18 (!) 152/80  11/11/18 (!) 166/98    Wt Readings from Last 3 Encounters:  05/15/19 227 lb (103 kg)  11/11/18 227 lb 12.8 oz (103.3 kg)  10/02/18 227 lb 15.3 oz (103.4 kg)    General appearance: alert, cooperative and appears stated age Ears: normal TM's and external ear canals both ears Throat: lips, mucosa, and tongue normal; teeth and gums normal Neck: no adenopathy, no carotid bruit, supple, symmetrical, trachea midline and thyroid not enlarged, symmetric, no tenderness/mass/nodules Back: symmetric, no curvature. ROM normal. No CVA tenderness. Lungs: clear to auscultation  bilaterally Heart: regular rate and rhythm, S1, S2 normal, no murmur, click, rub or gallop Abdomen: soft, non-tender; bowel sounds normal; no masses,  no organomegaly Pulses: 2+ and symmetric Skin: Skin color, texture, turgor normal. No rashes or lesions Lymph nodes: Cervical, supraclavicular, and axillary nodes normal. Neuro: CNs 2-12 intact. DTRsl 2+/4 in biceps, brachioradialis, patellars and achilles. Muscle strength 5/5 in upper and lower exremities. Fine resting tremor bleft hand , cerebellar function normal. Romberg negative.  No pronator drift.   Gait normal.   Lab Results  Component Value Date   HGBA1C 6.6 (H) 05/13/2019   HGBA1C 6.5 (A) 11/11/2018   HGBA1C 6.5 03/19/2017    Lab Results  Component Value Date   CREATININE 0.84 05/13/2019   CREATININE 0.83 11/06/2018   CREATININE 0.77 09/25/2017    Lab Results  Component Value  Date   WBC 10.7 (H) 05/16/2017   HGB 14.3 05/16/2017   HCT 41.5 05/16/2017   PLT 321.0 05/16/2017   GLUCOSE 139 (H) 05/13/2019   CHOL 191 05/13/2019   TRIG 143.0 05/13/2019   HDL 45.90 05/13/2019   LDLDIRECT 113.0 10/12/2014   LDLCALC 117 (H) 05/13/2019   ALT 39 (H) 05/13/2019   AST 33 05/13/2019   NA 136 05/13/2019   K 4.4 05/13/2019   CL 100 05/13/2019   CREATININE 0.84 05/13/2019   BUN 13 05/13/2019   CO2 29 05/13/2019   TSH 4.17 11/06/2018   INR 1.1 07/27/2007   HGBA1C 6.6 (H) 05/13/2019   MICROALBUR 1.6 03/19/2017    MM 3D SCREEN BREAST BILATERAL  Result Date: 12/17/2017 CLINICAL DATA:  Screening. EXAM: DIGITAL SCREENING BILATERAL MAMMOGRAM WITH TOMO AND CAD COMPARISON:  Previous exam(s). ACR Breast Density Category b: There are scattered areas of fibroglandular density. FINDINGS: There are no findings suspicious for malignancy. Images were processed with CAD. IMPRESSION: No mammographic evidence of malignancy. A result letter of this screening mammogram will be mailed directly to the patient. RECOMMENDATION: Screening mammogram in  one year. (Code:SM-B-01Y) BI-RADS CATEGORY  1: Negative. Electronically Signed   By: Abelardo Diesel M.D.   On: 12/17/2017 11:34    Assessment & Plan:   Problem List Items Addressed This Visit      Unprioritized   B12 deficiency - Primary    Managed with sublingual supplements. Repeat level is due         Relevant Orders   B12 and Folate Panel   Fatigue   Relevant Orders   TSH   Iron, TIBC and Ferritin Panel   CBC with Differential/Platelet   Ambulatory referral to Vascular Surgery   Fatty liver    Supported by CT  serologies negative for autoimmune causes of hepatitis.  ALT is mildly elevated,  and all modifiable risk factors were addressed again with patient today including obesity, metabolic syndrome and hyperlipidemia .  Hep A/B vaccine series has been given. She has been taking mild thistle and continues to defer  use of statin and metformin despite history of PAD and diabetes type 2 .   Lab Results  Component Value Date   ALT 39 (H) 05/13/2019   AST 33 05/13/2019   ALKPHOS 74 05/13/2019   BILITOT 1.2 05/13/2019           Hypothyroidism (acquired)    Thyroid function is WNL on current dose.  No current changes needed.   Lab Results  Component Value Date   TSH 4.17 11/06/2018         Neuropathy of foot    She denies foot drop.  B12 deficiency has been addressed with no change in symptoms .Marland Kitchen Heavy metals screen was normal (2012) she has diet controlled diabetes .  s=given her new onset tremor of left hand and short term memory deficits  , recommding neurology evaluation with  Dr Manuella Ghazi       Relevant Orders   Ambulatory referral to Neurology   OSA on CPAP    Diagnosed by sleep study. She is wearing her CPAP every night a minimum of 6 hours per night and notes persistent fatigue .  Advised to avoid new apparatus recommended by dentist, continue use of CPAP and increase efforts to lose weight       PAD (peripheral artery disease) ([place])   Relevant Orders    Ambulatory referral to Vascular Surgery   Panic attack as  reaction to stress    She uses alprazolam on average w times per month and declines therapy with SSRI . The risks and benefits of benzodiazepine use were reviewed with patient today including excessive sedation leading to respiratory depression,  impaired thinking/driving, and addiction.  Patient was advised to avoid concurrent use with alcohol, to use medication only as needed and not to share with others  .       White coat syndrome with diagnosis of hypertension    She is opposed to taking more mediations.  Readings at home have been lower with systolics in the 100's.         Other Visit Diagnoses    Numbness and tingling in left arm       Relevant Orders   Ambulatory referral to Neurology   Tremor of left hand       Relevant Orders   Ambulatory referral to Neurology   Memory loss, short term       Relevant Orders   Ambulatory referral to Neurology     A total of 40 minutes was spent with patient more than half of which was spent in counseling patient on the above mentioned issues , reviewing and explaining recent labs and imaging studies done, and coordination of care.  I am having [name] maintain her cetirizine, sodium chloride, Probiotic Product (PROBIOTIC COMPLEX ACIDOPHILUS PO), b complex vitamins, bimatoprost, albuterol, budesonide, MILK THISTLE PO, Co Q10, Cholecalciferol (VITAMIN D3 PO), Red Yeast Rice, B-12, EPINEPHrine, Diclofenac Sodium CR, telmisartan, tildrakizumab-asmn, and ALPRAZolam.  No orders of the defined types were placed in this encounter.   There are no discontinued medications.  Follow-up: Return in about 6 months (around 11/14/2019).   Sherlene Shams, MD

## 2019-05-17 NOTE — Assessment & Plan Note (Addendum)
Supported by CT  serologies negative for autoimmune causes of hepatitis.  ALT is mildly elevated,  and all modifiable risk factors were addressed again with patient today including obesity, metabolic syndrome and hyperlipidemia .  Hep A/B vaccine series has been given. She has been taking mild thistle and continues to defer  use of statin and metformin despite history of PAD and diabetes type 2 .   Lab Results  Component Value Date   ALT 39 (H) 05/13/2019   AST 33 05/13/2019   ALKPHOS 74 05/13/2019   BILITOT 1.2 05/13/2019

## 2019-05-17 NOTE — Assessment & Plan Note (Signed)
Thyroid function is WNL on current dose.  No current changes needed.   Lab Results  Component Value Date   TSH 4.17 11/06/2018

## 2019-05-17 NOTE — Assessment & Plan Note (Signed)
Fasting glucoses have been well under 125 but A1c has risen to 6.6  She has had her annual eye exam .   she is now taking a baby aspirin  . Not on Statin (refuses statin therapy) ,  But [facility] advised given new onset hypertension with LVH noted on prior ECHO    Low GI/Mediterranean  diet and exercise recommended   Lab Results  Component Value Date   HGBA1C 6.6 (H) 05/13/2019   Lab Results  Component Value Date   MICROALBUR 1.6 03/19/2017

## 2019-05-17 NOTE — Assessment & Plan Note (Signed)
Diagnosed by sleep study. She is wearing her CPAP every night a minimum of 6 hours per night and notes persistent fatigue .  Advised to avoid new apparatus recommended by dentist, continue use of CPAP and increase efforts to lose weight  

## 2019-05-17 NOTE — Assessment & Plan Note (Signed)
Managed with sublingual supplements. Repeat level is due

## 2019-05-17 NOTE — Assessment & Plan Note (Signed)
She uses alprazolam on average w times per month and declines therapy with SSRI . The risks and benefits of benzodiazepine use were reviewed with patient today including excessive sedation leading to respiratory depression,  impaired thinking/driving, and addiction.  Patient was advised to avoid concurrent use with alcohol, to use medication only as needed and not to share with others  .

## 2019-05-17 NOTE — Assessment & Plan Note (Addendum)
She denies foot drop.  B12 deficiency has been addressed with no change in symptoms .Marland Kitchen Heavy metals screen was normal (2012) she has diet controlled diabetes .  s=given her new onset tremor of left hand and short term memory deficits  , recommding neurology evaluation with  Dr Sherryll Burger

## 2019-05-21 ENCOUNTER — Ambulatory Visit: Payer: Medicare Other | Admitting: Podiatry

## 2019-05-26 ENCOUNTER — Other Ambulatory Visit: Payer: Self-pay

## 2019-05-26 ENCOUNTER — Other Ambulatory Visit (INDEPENDENT_AMBULATORY_CARE_PROVIDER_SITE_OTHER): Payer: Medicare Other

## 2019-05-26 DIAGNOSIS — R5383 Other fatigue: Secondary | ICD-10-CM

## 2019-05-26 DIAGNOSIS — E538 Deficiency of other specified B group vitamins: Secondary | ICD-10-CM | POA: Diagnosis not present

## 2019-05-26 LAB — CBC WITH DIFFERENTIAL/PLATELET
Basophils Absolute: 0.1 10*3/uL (ref 0.0–0.1)
Basophils Relative: 0.8 % (ref 0.0–3.0)
Eosinophils Absolute: 0.2 10*3/uL (ref 0.0–0.7)
Eosinophils Relative: 1.7 % (ref 0.0–5.0)
HCT: 41.1 % (ref 36.0–46.0)
Hemoglobin: 13.9 g/dL (ref 12.0–15.0)
Lymphocytes Relative: 36.6 % (ref 12.0–46.0)
Lymphs Abs: 4.1 10*3/uL — ABNORMAL HIGH (ref 0.7–4.0)
MCHC: 33.8 g/dL (ref 30.0–36.0)
MCV: 88.7 fl (ref 78.0–100.0)
Monocytes Absolute: 0.7 10*3/uL (ref 0.1–1.0)
Monocytes Relative: 6.1 % (ref 3.0–12.0)
Neutro Abs: 6.1 10*3/uL (ref 1.4–7.7)
Neutrophils Relative %: 54.8 % (ref 43.0–77.0)
Platelets: 332 10*3/uL (ref 150.0–400.0)
RBC: 4.64 Mil/uL (ref 3.87–5.11)
RDW: 13.6 % (ref 11.5–15.5)
WBC: 11.2 10*3/uL — ABNORMAL HIGH (ref 4.0–10.5)

## 2019-05-26 LAB — B12 AND FOLATE PANEL
Folate: 10.6 ng/mL (ref >5.9)
Vitamin B-12: 434 pg/mL (ref 211–911)

## 2019-05-26 LAB — TSH: TSH: 3.89 u[IU]/mL (ref 0.35–4.50)

## 2019-05-27 LAB — IRON,TIBC AND FERRITIN PANEL
%SAT: 28 % (calc) (ref 16–45)
Ferritin: 30 ng/mL (ref 16–288)
Iron: 91 ug/dL (ref 45–160)
TIBC: 324 mcg/dL (calc) (ref 250–450)

## 2019-06-05 ENCOUNTER — Telehealth: Payer: Self-pay | Admitting: Internal Medicine

## 2019-06-05 ENCOUNTER — Other Ambulatory Visit: Payer: Self-pay | Admitting: Internal Medicine

## 2019-06-05 DIAGNOSIS — Z8709 Personal history of other diseases of the respiratory system: Secondary | ICD-10-CM

## 2019-06-05 NOTE — Telephone Encounter (Signed)
Pt would like a referral to Dr. Jonnie Kind at [facility] Fax-[phone] for asthma for second opinion. Pt's cell is [phone]

## 2019-06-05 NOTE — Telephone Encounter (Signed)
Referral in process

## 2019-07-03 ENCOUNTER — Encounter (INDEPENDENT_AMBULATORY_CARE_PROVIDER_SITE_OTHER): Payer: Medicare Other | Admitting: Vascular Surgery

## 2019-07-31 ENCOUNTER — Ambulatory Visit: Payer: Medicare Other

## 2019-08-19 ENCOUNTER — Other Ambulatory Visit: Payer: Self-pay | Admitting: Internal Medicine

## 2019-09-09 ENCOUNTER — Other Ambulatory Visit: Payer: Self-pay

## 2019-09-09 ENCOUNTER — Ambulatory Visit
Admission: RE | Admit: 2019-09-09 | Discharge: 2019-09-09 | Disposition: A | Discharge status: Home / Self Care | Payer: Medicare Other | Source: Ambulatory Visit | Attending: Internal Medicine | Admitting: Internal Medicine

## 2019-09-09 DIAGNOSIS — Z1231 Encounter for screening mammogram for malignant neoplasm of breast: Secondary | ICD-10-CM

## 2019-09-10 ENCOUNTER — Ambulatory Visit (INDEPENDENT_AMBULATORY_CARE_PROVIDER_SITE_OTHER): Payer: Medicare Other

## 2019-09-10 ENCOUNTER — Ambulatory Visit (INDEPENDENT_AMBULATORY_CARE_PROVIDER_SITE_OTHER): Payer: Medicare Other | Admitting: Podiatry

## 2019-09-10 ENCOUNTER — Encounter: Payer: Self-pay | Admitting: Podiatry

## 2019-09-10 DIAGNOSIS — M722 Plantar fascial fibromatosis: Secondary | ICD-10-CM

## 2019-09-10 MED ORDER — METHYLPREDNISOLONE 4 MG PO TBPK
ORAL_TABLET | ORAL | 0 refills | Status: DC
Start: 2019-09-10 — End: 2019-11-19

## 2019-09-10 MED ORDER — MELOXICAM 15 MG PO TABS
15.0000 mg | ORAL_TABLET | Freq: Every day | ORAL | 3 refills | Status: DC
Start: 2019-09-10 — End: 2019-09-10

## 2019-09-10 NOTE — Progress Notes (Signed)
She presents today with chief complaint of right heel pain for the past 4 to 5 days she states that is been constant pain and feels like there is a wedge going into her foot.  She states that the pain is worse when walking she has been taking Tylenol and diclofenac for relief.  Objective: Vital signs are stable alert oriented x3.  Pulses are palpable.  She has pain on palpation near calcaneal tubercle of the right heel.  Radiographs taken today demonstrate an osseously mature individual no significant acute findings.  Soft tissue increase in density at the plantar fashion calcaneal insertion site indicative of plantar fasciitis with a small plantar distally oriented calcaneal heel spur.  Some soft tissue swelling is also noted in the cutis.  Assessment: Plantar fasciitis right.  Plan: Discussed etiology pathology conservative versus surgical therapies this point injected the right heel 20 mg Kenalog 5 mg Marcaine start her on a Medrol Dosepak and put her in a plantar fascial brace.  We will follow-up with her in 1 month.  Discussed appropriate shoe gear stretching exercise ice therapy and shoe gear modifications.

## 2019-09-15 ENCOUNTER — Telehealth: Payer: Self-pay | Admitting: Internal Medicine

## 2019-09-15 NOTE — Telephone Encounter (Signed)
Patient declined the Medicare Wellness Visit with [facility]  Patient feels questions are too personal and that she would be more comfortable if Dr Darrick Huntsman completed

## 2019-09-23 ENCOUNTER — Ambulatory Visit: Payer: Medicare Other

## 2019-10-13 ENCOUNTER — Other Ambulatory Visit: Payer: Self-pay

## 2019-10-13 ENCOUNTER — Ambulatory Visit (INDEPENDENT_AMBULATORY_CARE_PROVIDER_SITE_OTHER): Payer: Medicare Other | Admitting: Podiatry

## 2019-10-13 ENCOUNTER — Encounter: Payer: Self-pay | Admitting: Podiatry

## 2019-10-13 DIAGNOSIS — M722 Plantar fascial fibromatosis: Secondary | ICD-10-CM

## 2019-10-13 NOTE — Progress Notes (Signed)
She presents today states that is doing much better she states that she took the meloxicam for about 10 days on and off and she states that she feels about 80% better than she did.  She stated that it did make her heart race a bit though.  Objective: Vital signs are stable alert and oriented x3.  Pulses are palpable.  She has no pain on palpation of the medial calcaneal tubercles.  She has no pain on palpation posterior tibial tendons.  Assessment: Resolving plan fasciitis posterior tibial tendinitis.  Plan: Instructed her to cut the 15 mg meloxicam in half and try 7.5 mg to see if it makes her heart race or heart does not race with this then I would recommend she continue the anti-inflammatory until she has completely resolved the remaining 20%.  Also instructed her to continue to utilize her braces and her tennis shoes.  Follow-up with her as needed.

## 2019-10-17 ENCOUNTER — Telehealth: Payer: Self-pay | Admitting: Internal Medicine

## 2019-10-17 DIAGNOSIS — K760 Fatty (change of) liver, not elsewhere classified: Secondary | ICD-10-CM

## 2019-10-17 DIAGNOSIS — E785 Hyperlipidemia, unspecified: Secondary | ICD-10-CM

## 2019-10-17 DIAGNOSIS — E039 Hypothyroidism, unspecified: Secondary | ICD-10-CM

## 2019-10-17 DIAGNOSIS — E1149 Type 2 diabetes mellitus with other diabetic neurological complication: Secondary | ICD-10-CM

## 2019-10-17 NOTE — Telephone Encounter (Signed)
Patient has a 60m follow up on 11/19/19, she would like to do labs before appt. No lab orders are in patient's chart. If patient needs labs please let me know so I can call patient to schedule.

## 2019-10-20 NOTE — Telephone Encounter (Signed)
Pt would like to have labs done before her appt on 11/19/2019. I have ordered TSH, Lipid panel, A1c and CMP. Is there anything else that needs to be ordered?

## 2019-11-10 ENCOUNTER — Other Ambulatory Visit (INDEPENDENT_AMBULATORY_CARE_PROVIDER_SITE_OTHER): Payer: Medicare Other

## 2019-11-10 ENCOUNTER — Other Ambulatory Visit: Payer: Self-pay

## 2019-11-10 DIAGNOSIS — E785 Hyperlipidemia, unspecified: Secondary | ICD-10-CM

## 2019-11-10 DIAGNOSIS — E1149 Type 2 diabetes mellitus with other diabetic neurological complication: Secondary | ICD-10-CM | POA: Diagnosis not present

## 2019-11-10 DIAGNOSIS — K760 Fatty (change of) liver, not elsewhere classified: Secondary | ICD-10-CM

## 2019-11-10 DIAGNOSIS — E039 Hypothyroidism, unspecified: Secondary | ICD-10-CM | POA: Diagnosis not present

## 2019-11-10 LAB — COMPREHENSIVE METABOLIC PANEL
ALT: 32 U/L (ref 0–35)
AST: 28 U/L (ref 0–37)
Albumin: 3.9 g/dL (ref 3.5–5.2)
Alkaline Phosphatase: 72 U/L (ref 39–117)
BUN: 15 mg/dL (ref 6–23)
CO2: 28 mEq/L (ref 19–32)
Calcium: 8.8 mg/dL (ref 8.4–10.5)
Chloride: 101 mEq/L (ref 96–112)
Creatinine, Ser: 0.8 mg/dL (ref 0.40–1.20)
GFR: 72.22 mL/min (ref >60.00)
Glucose, Bld: 120 mg/dL — ABNORMAL HIGH (ref 70–99)
Potassium: 4.7 mEq/L (ref 3.5–5.1)
Sodium: 137 mEq/L (ref 135–145)
Total Bilirubin: 1.2 mg/dL (ref 0.2–1.2)
Total Protein: 6.5 g/dL (ref 6.0–8.3)

## 2019-11-10 LAB — LIPID PANEL
Cholesterol: 187 mg/dL (ref 0–200)
HDL: 52.1 mg/dL (ref >39.00)
LDL Cholesterol: 108 mg/dL — ABNORMAL HIGH (ref 0–99)
NonHDL: 135.34
Total CHOL/HDL Ratio: 4
Triglycerides: 138 mg/dL (ref 0.0–149.0)
VLDL: 27.6 mg/dL (ref 0.0–40.0)

## 2019-11-10 LAB — TSH: TSH: 4.52 u[IU]/mL — ABNORMAL HIGH (ref 0.35–4.50)

## 2019-11-10 LAB — HEMOGLOBIN A1C: Hgb A1c MFr Bld: 7 % — ABNORMAL HIGH (ref 4.6–6.5)

## 2019-11-11 NOTE — Progress Notes (Signed)
your recent fasting labs REVIEWED,  we will discuss in detail at your recent  visit.  Regards,   Duncan Dull, MD

## 2019-11-12 LAB — HM DIABETES EYE EXAM

## 2019-11-19 ENCOUNTER — Encounter: Payer: Self-pay | Admitting: Internal Medicine

## 2019-11-19 ENCOUNTER — Ambulatory Visit (INDEPENDENT_AMBULATORY_CARE_PROVIDER_SITE_OTHER): Payer: Medicare Other | Admitting: Internal Medicine

## 2019-11-19 ENCOUNTER — Other Ambulatory Visit: Payer: Self-pay

## 2019-11-19 VITALS — BP 132/80 | HR 89 | Temp 98.6°F | Resp 16 | Ht 66.0 in | Wt 224.6 lb

## 2019-11-19 DIAGNOSIS — E785 Hyperlipidemia, unspecified: Secondary | ICD-10-CM

## 2019-11-19 DIAGNOSIS — I10 Essential (primary) hypertension: Secondary | ICD-10-CM

## 2019-11-19 DIAGNOSIS — E1149 Type 2 diabetes mellitus with other diabetic neurological complication: Secondary | ICD-10-CM

## 2019-11-19 DIAGNOSIS — G4733 Obstructive sleep apnea (adult) (pediatric): Secondary | ICD-10-CM

## 2019-11-19 DIAGNOSIS — R5383 Other fatigue: Secondary | ICD-10-CM

## 2019-11-19 DIAGNOSIS — E538 Deficiency of other specified B group vitamins: Secondary | ICD-10-CM

## 2019-11-19 DIAGNOSIS — E559 Vitamin D deficiency, unspecified: Secondary | ICD-10-CM

## 2019-11-19 DIAGNOSIS — I739 Peripheral vascular disease, unspecified: Secondary | ICD-10-CM

## 2019-11-19 DIAGNOSIS — Z9989 Dependence on other enabling machines and devices: Secondary | ICD-10-CM

## 2019-11-19 MED ORDER — ATORVASTATIN CALCIUM 20 MG PO TABS: 20.0000 mg | ORAL_TABLET | ORAL | 0 refills | Status: DC

## 2019-11-19 MED ORDER — ONDANSETRON HCL 4 MG PO TABS: 4.0000 mg | ORAL_TABLET | Freq: Three times a day (TID) | ORAL | 0 refills | Status: DC | PRN

## 2019-11-19 NOTE — Progress Notes (Signed)
n

## 2019-11-19 NOTE — Progress Notes (Signed)
Patient ID: [name], female    DOB: [birth year]  Age: 74 y.o. MRN: [id number]  The patient is here for annual  FOLLOW UP AND management of other chronic and acute problems.  This visit occurred during the SARS-CoV-2 public health emergency.  Safety protocols were in place, including screening questions prior to the visit, additional usage of staff PPE, and extensive cleaning of exam room while observing appropriate contact time as indicated for disinfecting solutions.    Patient has received both doses of the available COVID 19 vaccine without complications.  Patient continues to mask when outside of the home except when walking in yard or at safe distances from others .  Patient denies any change in mood or development of unhealthy behaviors resuting from the pandemic's restriction of activities and socialization.     The risk factors are reflected in the social history.  The roster of all physicians providing medical care to patient - is listed in the Snapshot section of the chart.  Activities of daily living:  The patient is 100% independent in all ADLs: dressing, toileting, feeding as well as independent mobility  Home safety : The patient has smoke detectors in the home. They wear seatbelts.  There are no firearms at home. There is no violence in the home.   There is no risks for hepatitis, STDs or HIV. There is no   history of blood transfusion. They have no travel history to infectious disease endemic areas of the world.  The patient has seen their dentist in the last six month. They have seen their eye doctor in the last year. They admit to slight hearing difficulty with regard to whispered voices and some television programs.  They have deferred audiologic testing in the last year.  They do not  have excessive sun exposure. Discussed the need for sun protection: hats, long sleeves and use of sunscreen if there is significant sun exposure.   Diet: the importance of a healthy diet is  discussed. They do have a healthy diet.  The benefits of regular aerobic exercise were discussed. She walks 4 times per week ,  20 minutes.   Depression screen: there are no signs or vegative symptoms of depression- irritability, change in appetite, anhedonia, sadness/tearfullness.  Cognitive assessment: the patient manages all their financial and personal affairs and is actively engaged. They could relate day,date,year and events; recalled 2/3 objects at 3 minutes; performed clock-face test normally.  The following portions of the patient's history were reviewed and updated as appropriate: allergies, current medications, past family history, past medical history,  past surgical history, past social history  and problem list.  Visual acuity was not assessed per patient preference since she has regular follow up with her ophthalmologist. Hearing and body mass index were assessed and reviewed.   During the course of the visit the patient was educated and counseled about appropriate screening and preventive services including : fall prevention , diabetes screening, nutrition counseling, colorectal cancer screening, and recommended immunizations.    CC: The primary encounter diagnosis was Fatigue, unspecified type. Diagnoses of B12 deficiency, Hyperlipidemia LDL goal <130, Type 2 diabetes mellitus with neurological complications (HCC), Vitamin D deficiency, White coat syndrome with diagnosis of hypertension, OSA on CPAP, and PAD (peripheral artery disease) (HCC) were also pertinent to this visit.  1) treated for plantar fasciitis with 2 steroid injections by podiatry.    2) Improving but previously severe  right lateral popliteal  pain accompanied by a "pop" that was audible  To family ,  occrred in early September.  Has ortho evaluation with Dr. Odis LusterBowers next week  3)PAD:   patient unwilling to start statin,BUT FINALLY agreed to try once weekly atorvastatin (fear of statin bc father had statin myalgia  and neighbor as well).  She has concurrent diagnoses of  FATTY LIVER AND T2DM   4) using diclofenac prn , helps  Joint pain  5) declines shingles vaccine   6) Getting Ilumya from Dr Roseanne KaufmanIsenstein for placque psoriasis .  Worried about long term side effects   7)HTN:   home readings of BP are <140/80 on a calibrated machine   80 using xanax for panic attacks.   Last one occurred during a friend's funeral   9) requesting rx for zofran for prn episodes of nausea   History Maria NixonJanice has a past medical history of Asthma, Asthma due to environmental allergies, Cataract, Cellulitis, Diabetes mellitus, Edema leg, Endometrial hyperplasia, External hemorrhoid, Fatty liver, Glaucoma, Hepatic cyst, Hypertension, IBS (irritable bowel syndrome), Murmur, cardiac, Neuropathy, OSA on CPAP, Osteoarthritis, Psoriasis, and Sleep apnea.   She has a past surgical history that includes Hysteroscopy with D & C (2007); BONE DENSITY TEST (2006); ONE VAGINAL DELIVERY; Vaginal hysterectomy (2010); and Umbilical hernia repair (2010).   Her family history includes Heart disease (age of onset: 6874) in her father; Mental illness in her mother.She reports that she quit smoking about 41 years ago. Her smoking use included cigarettes. She has never used smokeless tobacco. She reports that she does not drink alcohol and does not use drugs.  Outpatient Medications Prior to Visit  Medication Sig Dispense Refill  . albuterol (VENTOLIN HFA) 108 (90 Base) MCG/ACT inhaler Inhale 2 puffs into the lungs every 6 (six) hours as needed for wheezing or shortness of breath.    . ALPRAZolam (XANAX) 0.25 MG tablet Take 1 tablet (0.25 mg total) by mouth 2 (two) times daily as needed for anxiety. 30 tablet 0  . b complex vitamins tablet Take 1 tablet by mouth daily.    . bimatoprost (LUMIGAN) 0.03 % ophthalmic solution Place 1 drop into both eyes at bedtime.    . budesonide (PULMICORT) 180 MCG/ACT inhaler Inhale 2 puffs into the lungs 2 (two) times  daily.    . cetirizine (ZYRTEC) 10 MG tablet Take 10 mg by mouth daily.      . Cholecalciferol (VITAMIN D3 PO) Take 2,000 Units by mouth daily.    . Cyanocobalamin (B-12) 2500 MCG SUBL Place 1 tablet under the tongue once a week.    . Diclofenac Sodium CR 100 MG 24 hr tablet TAKE 1 TABLET BY MOUTH EVERY DAY 90 tablet 0  . EPINEPHrine 0.3 mg/0.3 mL IJ SOAJ injection See admin instructions.    Marland Kitchen. MILK THISTLE PO Take 1 capsule by mouth daily.     . Probiotic Product (PROBIOTIC COMPLEX ACIDOPHILUS PO) Take 1 capsule by mouth daily.    . sodium chloride (OCEAN) 0.65 % SOLN nasal spray Place 1 spray into the nose as needed.      Marland Kitchen. telmisartan (MICARDIS) 20 MG tablet TAKE 1 TABLET BY MOUTH EVERY DAY 90 tablet 3  . tildrakizumab-asmn (ILUMYA) 100 MG/ML subcutaneous injection Inject 100 mg into the skin every 3 (three) months.    . methylPREDNISolone (MEDROL DOSEPAK) 4 MG TBPK tablet 6 day dose pack - take as directed (Patient not taking: Reported on 11/19/2019) 21 tablet 0   No facility-administered medications prior to visit.    Review of Systems  Patient denies headache, fevers, malaise, unintentional weight loss, skin rash, eye pain, sinus congestion and sinus pain, sore throat, dysphagia,  hemoptysis , cough, dyspnea, wheezing, chest pain, palpitations, orthopnea, edema, abdominal pain, nausea, melena, diarrhea, constipation, flank pain, dysuria, hematuria, urinary  Frequency, nocturia, numbness, tingling, seizures,  Focal weakness, Loss of consciousness,  Tremor, insomnia, depression, anxiety, and suicidal ideation.      Objective:  BP 132/80 (BP Location: Left Arm, Patient Position: Sitting, Cuff Size: Large)   Pulse 89   Temp 98.6 F (37 C) (Oral)   Resp 16   Ht 5\' 6"  (1.676 m)   Wt 224 lb 9.6 oz (101.9 kg)   SpO2 97%   BMI 36.25 kg/m   Physical Exam  General appearance: alert, cooperative and appears stated age Ears: normal TM's and external ear canals both ears Throat: lips,  mucosa, and tongue normal; teeth and gums normal Neck: no adenopathy, no carotid bruit, supple, symmetrical, trachea midline and thyroid not enlarged, symmetric, no tenderness/mass/nodules Back: symmetric, no curvature. ROM normal. No CVA tenderness. Lungs: clear to auscultation bilaterally Heart: regular rate and rhythm, S1, S2 normal, no murmur, click, rub or gallop Abdomen: soft, non-tender; bowel sounds normal; no masses,  no organomegaly Pulses: 2+ and symmetric Skin: Skin color, texture, turgor normal. No rashes or lesions Lymph nodes: Cervical, supraclavicular, and axillary nodes normal.  Assessment & Plan:   Problem List Items Addressed This Visit      Unprioritized   B12 deficiency   Relevant Orders   Vitamin B12   Fatigue - Primary   Relevant Orders   TSH   Hyperlipidemia LDL goal <130    Lengthy discussion about the risks and benefits of statin therapy.  She is willing to start a high potency statin once a week.       Relevant Medications   atorvastatin (LIPITOR) 20 MG tablet   Other Relevant Orders   Direct LDL   OSA on CPAP    Diagnosed by sleep study. She is wearing her CPAP every night a minimum of 6 hours per night and notes persistent fatigue .  Advised to avoid new apparatus recommended by dentist, continue use of CPAP and increase efforts to lose weight       PAD (peripheral artery disease) (HCC)    Noted on carotid ultrasounds and on ABIs done during [facility]  ABI on right leg was 0.85  Statin advised and finally accepted as a trial  by patient .  She is asymptomatic       Relevant Medications   atorvastatin (LIPITOR) 20 MG tablet   Type 2 diabetes mellitus with neurological complications (HCC)    Fasting glucoses have been well under 125 but A1c has risen to 7.0  She has had her annual eye exam .   she is now taking a baby aspirin and willing to start statin once a week  (has historically refused statin therapy) .   Taking ARB  given new onset  hypertension with LVH noted on prior ECHO    Low GI/Mediterranean  diet and exercise recommended   Lab Results  Component Value Date   HGBA1C 7.0 (H) 11/10/2019   Lab Results  Component Value Date   MICROALBUR 1.6 03/19/2017            Relevant Medications   atorvastatin (LIPITOR) 20 MG tablet   Other Relevant Orders   Hemoglobin A1c   Comprehensive metabolic panel   Microalbumin / creatinine urine ratio  Vitamin D deficiency   Relevant Orders   VITAMIN D 25 Hydroxy (Vit-D Deficiency, Fractures)   White coat syndrome with diagnosis of hypertension    Home readings on a calibrated machine have been consistently 140/80       Relevant Medications   atorvastatin (LIPITOR) 20 MG tablet      I have discontinued [name] Fiallos's methylPREDNISolone. I am also having her start on atorvastatin and ondansetron. Additionally, I am having her maintain her cetirizine, sodium chloride, Probiotic Product (PROBIOTIC COMPLEX ACIDOPHILUS PO), b complex vitamins, bimatoprost, albuterol, budesonide, MILK THISTLE PO, Cholecalciferol (VITAMIN D3 PO), B-12, EPINEPHrine, Diclofenac Sodium CR, tildrakizumab-asmn, ALPRAZolam, and telmisartan.  Meds ordered this encounter  Medications  . atorvastatin (LIPITOR) 20 MG tablet    Sig: Take 1 tablet (20 mg total) by mouth once a week.    Dispense:  12 tablet    Refill:  0  . ondansetron (ZOFRAN) 4 MG tablet    Sig: Take 1 tablet (4 mg total) by mouth every 8 (eight) hours as needed for nausea or vomiting.    Dispense:  30 tablet    Refill:  0    Medications Discontinued During This Encounter  Medication Reason  . methylPREDNISolone (MEDROL DOSEPAK) 4 MG TBPK tablet    A total of 40 minutes was spent with patient more than half of which was spent in counseling patient on the above mentioned issues , reviewing and explaining recent labs and imaging studies done, and coordination of care. Follow-up: Return in about 3 months (around 02/19/2020) for  follow up diabetes.   Sherlene Shams, MD

## 2019-11-19 NOTE — Patient Instructions (Signed)
  I'm very RELIEVED that you are willing to reduce your risk of stroke by taking generic Lipitor once a week. I sent a 90 day  supply (12 pills) to your pharmacy  If you tolerate the medication , please consider increasing your dose to 2 times per week   Your diabetes is under very good control currently without medications.  Please plan to return in 3   months for follow up on diabetes.   Fasting labs need to be done a day or two prior to visit so we can discuss them at your visit. Also, if you have not done so,  make sure you are seeing your eye doctor at least once a year.

## 2019-11-19 NOTE — Assessment & Plan Note (Signed)
Home readings on a calibrated machine have been consistently 140/80

## 2019-11-20 NOTE — Assessment & Plan Note (Signed)
Lengthy discussion about the risks and benefits of statin therapy.  She is willing to start a high potency statin once a week.

## 2019-11-20 NOTE — Assessment & Plan Note (Signed)
Fasting glucoses have been well under 125 but A1c has risen to 7.0  She has had her annual eye exam .   she is now taking a baby aspirin and willing to start statin once a week  (has historically refused statin therapy) .   Taking ARB  given new onset hypertension with LVH noted on prior ECHO    Low GI/Mediterranean  diet and exercise recommended   Lab Results  Component Value Date   HGBA1C 7.0 (H) 11/10/2019   Lab Results  Component Value Date   MICROALBUR 1.6 03/19/2017

## 2019-11-20 NOTE — Assessment & Plan Note (Signed)
Diagnosed by sleep study. She is wearing her CPAP every night a minimum of 6 hours per night and notes persistent fatigue .  Advised to avoid new apparatus recommended by dentist, continue use of CPAP and increase efforts to lose weight

## 2019-11-20 NOTE — Assessment & Plan Note (Signed)
Noted on carotid ultrasounds and on ABIs done during [facility]  ABI on right leg was 0.85  Statin advised and finally accepted as a trial  by patient .  She is asymptomatic

## 2019-11-24 ENCOUNTER — Ambulatory Visit: Payer: Medicare Other | Admitting: Podiatry

## 2020-01-12 ENCOUNTER — Other Ambulatory Visit: Payer: Self-pay | Admitting: Internal Medicine

## 2020-02-17 ENCOUNTER — Other Ambulatory Visit: Payer: Self-pay

## 2020-02-17 ENCOUNTER — Other Ambulatory Visit (INDEPENDENT_AMBULATORY_CARE_PROVIDER_SITE_OTHER): Payer: Medicare Other

## 2020-02-17 DIAGNOSIS — E538 Deficiency of other specified B group vitamins: Secondary | ICD-10-CM | POA: Diagnosis not present

## 2020-02-17 DIAGNOSIS — E1149 Type 2 diabetes mellitus with other diabetic neurological complication: Secondary | ICD-10-CM | POA: Diagnosis not present

## 2020-02-17 DIAGNOSIS — E559 Vitamin D deficiency, unspecified: Secondary | ICD-10-CM | POA: Diagnosis not present

## 2020-02-17 DIAGNOSIS — R5383 Other fatigue: Secondary | ICD-10-CM | POA: Diagnosis not present

## 2020-02-17 DIAGNOSIS — E785 Hyperlipidemia, unspecified: Secondary | ICD-10-CM | POA: Diagnosis not present

## 2020-02-17 LAB — COMPREHENSIVE METABOLIC PANEL
ALT: 35 U/L (ref 0–35)
AST: 27 U/L (ref 0–37)
Albumin: 4 g/dL (ref 3.5–5.2)
Alkaline Phosphatase: 71 U/L (ref 39–117)
BUN: 17 mg/dL (ref 6–23)
CO2: 30 mEq/L (ref 19–32)
Calcium: 9.4 mg/dL (ref 8.4–10.5)
Chloride: 100 mEq/L (ref 96–112)
Creatinine, Ser: 0.81 mg/dL (ref 0.40–1.20)
GFR: 71.01 mL/min (ref >60.00)
Glucose, Bld: 129 mg/dL — ABNORMAL HIGH (ref 70–99)
Potassium: 4.7 mEq/L (ref 3.5–5.1)
Sodium: 136 mEq/L (ref 135–145)
Total Bilirubin: 1.2 mg/dL (ref 0.2–1.2)
Total Protein: 6.9 g/dL (ref 6.0–8.3)

## 2020-02-17 LAB — MICROALBUMIN / CREATININE URINE RATIO
Creatinine,U: 71.9 mg/dL
Microalb Creat Ratio: 1 mg/g (ref 0.0–30.0)
Microalb, Ur: <0.7 mg/dL (ref 0.0–1.9)

## 2020-02-17 LAB — VITAMIN B12: Vitamin B-12: 162 pg/mL — ABNORMAL LOW (ref 211–911)

## 2020-02-17 LAB — HEMOGLOBIN A1C: Hgb A1c MFr Bld: 6.9 % — ABNORMAL HIGH (ref 4.6–6.5)

## 2020-02-17 LAB — LDL CHOLESTEROL, DIRECT: Direct LDL: 116 mg/dL

## 2020-02-17 LAB — TSH: TSH: 3.88 u[IU]/mL (ref 0.35–4.50)

## 2020-02-17 LAB — VITAMIN D 25 HYDROXY (VIT D DEFICIENCY, FRACTURES): VITD: 22.37 ng/mL — ABNORMAL LOW (ref 30.00–100.00)

## 2020-02-18 NOTE — Progress Notes (Signed)
Attached are her recent fasting labs,  We will discuss in detail at yher  upcoming visit.  She will need a b12 injection tomorrow   Regards,   Duncan Dull, MD

## 2020-02-19 ENCOUNTER — Encounter: Payer: Self-pay | Admitting: Internal Medicine

## 2020-02-19 ENCOUNTER — Ambulatory Visit (INDEPENDENT_AMBULATORY_CARE_PROVIDER_SITE_OTHER): Payer: Medicare Other | Admitting: Internal Medicine

## 2020-02-19 ENCOUNTER — Other Ambulatory Visit: Payer: Self-pay | Admitting: Internal Medicine

## 2020-02-19 ENCOUNTER — Other Ambulatory Visit: Payer: Self-pay

## 2020-02-19 ENCOUNTER — Telehealth: Payer: Self-pay | Admitting: Internal Medicine

## 2020-02-19 VITALS — BP 139/75 | HR 87 | Temp 99.1°F | Ht 65.98 in | Wt 228.6 lb

## 2020-02-19 DIAGNOSIS — E538 Deficiency of other specified B group vitamins: Secondary | ICD-10-CM

## 2020-02-19 DIAGNOSIS — E785 Hyperlipidemia, unspecified: Secondary | ICD-10-CM

## 2020-02-19 DIAGNOSIS — E559 Vitamin D deficiency, unspecified: Secondary | ICD-10-CM

## 2020-02-19 DIAGNOSIS — D84821 Immunodeficiency due to drugs: Secondary | ICD-10-CM | POA: Diagnosis not present

## 2020-02-19 DIAGNOSIS — J0110 Acute frontal sinusitis, unspecified: Secondary | ICD-10-CM

## 2020-02-19 DIAGNOSIS — F410 Panic disorder [episodic paroxysmal anxiety] without agoraphobia: Secondary | ICD-10-CM

## 2020-02-19 DIAGNOSIS — E1149 Type 2 diabetes mellitus with other diabetic neurological complication: Secondary | ICD-10-CM | POA: Diagnosis not present

## 2020-02-19 DIAGNOSIS — Z79899 Other long term (current) drug therapy: Secondary | ICD-10-CM

## 2020-02-19 DIAGNOSIS — K760 Fatty (change of) liver, not elsewhere classified: Secondary | ICD-10-CM

## 2020-02-19 DIAGNOSIS — F430 Acute stress reaction: Secondary | ICD-10-CM

## 2020-02-19 MED ORDER — CYANOCOBALAMIN 1000 MCG/ML IJ SOLN
1000.0000 ug | Freq: Once | INTRAMUSCULAR | Status: AC
  Administered 2020-02-19: 1000 ug via INTRAMUSCULAR

## 2020-02-19 MED ORDER — ERGOCALCIFEROL 1.25 MG (50000 UT) PO CAPS: 50000.0000 [IU] | ORAL_CAPSULE | ORAL | 0 refills | Status: DC

## 2020-02-19 MED ORDER — DICLOFENAC SODIUM ER 100 MG PO TB24: 100.0000 mg | ORAL_TABLET | Freq: Every day | ORAL | 1 refills | Status: DC

## 2020-02-19 MED ORDER — PREDNISONE 10 MG PO TABS: ORAL_TABLET | ORAL | 0 refills | Status: DC

## 2020-02-19 MED ORDER — AMOXICILLIN-POT CLAVULANATE 875-125 MG PO TABS: 1.0000 | ORAL_TABLET | Freq: Two times a day (BID) | ORAL | 0 refills | Status: DC

## 2020-02-19 MED ORDER — CYANOCOBALAMIN 1000 MCG/ML IJ SOLN: 1000.0000 ug | INTRAMUSCULAR | 11 refills | Status: DC

## 2020-02-19 NOTE — Telephone Encounter (Signed)
Patient wanted to get labs before her visit on 06-16-20 need order

## 2020-02-19 NOTE — Patient Instructions (Addendum)
I am treating you for sinusitis/otitis which may be from COVID or another viral infection     PLEASE TEST YOURSELF AT HOME    I am prescribing an antibiotic (augmentin ) and a prednisone taper  To manage the infection and the inflammation in your ear/sinuses.   I also advise use of the following OTC meds to help with your other symptoms.    you may take Afrin nasal spray for the nighttime dose of sudafed PE  If needed to prevent insomnia.  flush your sinuses twice daily with Simply Saline (do over the sink because if you do it right you will spit out globs of mucus)  Please take a probiotic ( Align, Floraque or Culturelle) while you are on the antibiotic to prevent  the  serious antibiotic associated diarrhea  Called clostridium dificile colitis and a vaginal yeast infection    You can add up to 1000 mg of acetominophen (tylenol) every day safely  In divided doses (500 mg every  12 hours.) TOT HE DICLOFENAC

## 2020-02-19 NOTE — Progress Notes (Signed)
Subjective:  Patient ID: [name], female    DOB: Dec 31, 1945  Age: 75 y.o. MRN: [id number]  CC: The primary encounter diagnosis was B12 deficiency. Diagnoses of Immunocompromised state due to drug therapy [facility]), Acute non-recurrent frontal sinusitis, Type 2 diabetes mellitus with neurological complications (HCC), Hyperlipidemia LDL goal <130, Vitamin D deficiency, and Panic attack as reaction to stress were also pertinent to this visit.  HPI [name] presents for FOLLOW UP ON MULTIPLE ISSUES  This visit occurred during the SARS-CoV-2 public health emergency.  Safety protocols were in place, including screening questions prior to the visit, additional usage of staff PPE, and extensive cleaning of exam room while observing appropriate contact time as indicated for disinfecting solutions.   b12 DEficiency:  Stopped taking a supplement 3 months ago.    VIT D DEFICIENCY   Not taking any   GRIEF:  HAS LOST 5 LOVED ONES IN 21 DAYS    TYPE 2 DM:  reviewed control   PLAGUED WITH ORTHOPEDIC AND PODIATRY ISSUES . USING WALKER.  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  SEEING BOWERS FOR THE HAMSTRING PULL AND GETTING PT WHICH MADE THE INJURY WORSE SO SHE asked to be released after 3 sessions     Has been blowing blood streaked nasal discharge along with purulent drainage,  For several days.  No headache .  But has become very congested   Outpatient Medications Prior to Visit  Medication Sig Dispense Refill  . albuterol (VENTOLIN HFA) 108 (90 Base) MCG/ACT inhaler Inhale 2 puffs into the lungs every 6 (six) hours as needed for wheezing or shortness of breath.    . ALPRAZolam (XANAX) 0.25 MG tablet Take 1 tablet (0.25 mg total) by mouth 2 (two) times daily as needed for anxiety. 30 tablet 0  . atorvastatin (LIPITOR) 20 MG tablet TAKE 1 TABLET (20 MG TOTAL) BY MOUTH ONCE A WEEK. 12 tablet 0  . b complex vitamins tablet Take 1 tablet  by mouth daily.    . bimatoprost (LUMIGAN) 0.03 % ophthalmic solution Place 1 drop into both eyes at bedtime.    . budesonide (PULMICORT) 180 MCG/ACT inhaler Inhale 2 puffs into the lungs 2 (two) times daily.    . cetirizine (ZYRTEC) 10 MG tablet Take 10 mg by mouth daily.    . Cholecalciferol (VITAMIN D3 PO) Take 2,000 Units by mouth daily.    . Cyanocobalamin (B-12) 2500 MCG SUBL Place 1 tablet under the tongue once a week.    Marland Kitchen EPINEPHrine 0.3 mg/0.3 mL IJ SOAJ injection See admin instructions.    . methocarbamol (ROBAXIN) 750 MG tablet methocarbamol 750 mg tablet  TAKE 1 TABLET BY MOUTH AT BEDTIME    . MILK THISTLE PO Take 1 capsule by mouth daily.     . ondansetron (ZOFRAN) 4 MG tablet Take 1 tablet (4 mg total) by mouth every 8 (eight) hours as needed for nausea or vomiting. 30 tablet 0  . Probiotic Product (PROBIOTIC COMPLEX ACIDOPHILUS PO) Take 1 capsule by mouth daily.    . sodium chloride (OCEAN) 0.65 % SOLN nasal spray Place 1 spray into the nose as needed.    Marland Kitchen telmisartan (MICARDIS) 20 MG tablet TAKE 1 TABLET BY MOUTH EVERY DAY 90 tablet 3  . tildrakizumab-asmn (ILUMYA) 100 MG/ML subcutaneous injection Inject 100 mg into the skin every 3 (three) months.    Marland Kitchen tiZANidine (ZANAFLEX) 4 MG tablet tizanidine 4 mg tablet  TAKE 1 TABLET BY MOUTH EVERYDAY AT BEDTIME    . Diclofenac Sodium CR 100 MG 24 hr tablet TAKE 1 TABLET BY MOUTH EVERY DAY 90 tablet 0   No facility-administered medications prior to visit.    Review of Systems;  Patient denies headache, fevers, malaise, unintentional weight loss, skin rash, eye pain, sinus congestion and sinus pain, sore throat, dysphagia,  hemoptysis , cough, dyspnea, wheezing, chest pain, palpitations, orthopnea, edema, abdominal pain, nausea, melena, diarrhea, constipation, flank pain, dysuria, hematuria, urinary  Frequency, nocturia, numbness, tingling, seizures,  Focal weakness, Loss of consciousness,  Tremor, insomnia, depression, anxiety, and  suicidal ideation.      Objective:  BP 139/75 (BP Location: Left Arm, Patient Position: Sitting)   Pulse 87   Temp 99.1 F (37.3 C)   Ht 5' 5.98" (1.676 m)   Wt 228 lb 9.6 oz (103.7 kg)   SpO2 97%   BMI 36.91 kg/m   BP Readings from Last 3 Encounters:  02/19/20 139/75  11/19/19 132/80  05/15/19 (!) 140/98    Wt Readings from Last 3 Encounters:  02/19/20 228 lb 9.6 oz (103.7 kg)  11/19/19 224 lb 9.6 oz (101.9 kg)  05/15/19 227 lb (103 kg)    General appearance: alert, cooperative and appears stated age Ears: normal TM's and external ear canals both ears Throat: lips, mucosa, and tongue normal; teeth and gums normal Neck: no adenopathy, no carotid bruit, supple, symmetrical, trachea midline and thyroid not enlarged, symmetric, no tenderness/mass/nodules Back: symmetric, no curvature. ROM normal. No CVA tenderness. Lungs: clear to auscultation bilaterally Heart: regular rate and rhythm, S1, S2 normal, no murmur, click, rub or gallop Abdomen: soft, non-tender; bowel sounds normal; no masses,  no organomegaly Pulses: 2+ and symmetric Skin: Skin color, texture, turgor normal. No rashes or lesions Lymph nodes: Cervical, supraclavicular, and axillary nodes normal.  Lab Results  Component Value Date   HGBA1C 6.9 (H) 02/17/2020   HGBA1C 7.0 (H) 11/10/2019   HGBA1C 6.6 (H) 05/13/2019    Lab Results  Component Value Date   CREATININE 0.81 02/17/2020   CREATININE 0.80 11/10/2019   CREATININE 0.84 05/13/2019    Lab Results  Component Value Date   WBC 11.2 (H) 05/26/2019   HGB 13.9 05/26/2019   HCT 41.1 05/26/2019   PLT 332.0 05/26/2019   GLUCOSE 129 (H) 02/17/2020   CHOL 187 11/10/2019   TRIG 138.0 11/10/2019   HDL 52.10 11/10/2019   LDLDIRECT 116.0 02/17/2020   LDLCALC 108 (H) 11/10/2019   ALT 35 02/17/2020   AST 27 02/17/2020   NA 136 02/17/2020   K 4.7 02/17/2020   CL 100 02/17/2020   CREATININE 0.81 02/17/2020   BUN 17 02/17/2020   CO2 30 02/17/2020    TSH 3.88 02/17/2020   INR 1.1 07/27/2007   HGBA1C 6.9 (H) 02/17/2020   MICROALBUR <0.7 02/17/2020    DG Foot Complete Right  Result Date: 09/10/2019 Please see detailed radiograph report in office note.  MM 3D SCREEN BREAST BILATERAL  Result Date: 09/09/2019 CLINICAL DATA:  Screening. EXAM: DIGITAL SCREENING BILATERAL MAMMOGRAM WITH TOMO AND CAD COMPARISON:  Previous exam(s). ACR Breast Density Category b: There are scattered areas of fibroglandular density. FINDINGS: There are no findings suspicious for malignancy. Images were processed with CAD. IMPRESSION: No mammographic evidence of malignancy. A result letter of this screening mammogram will be mailed directly to the patient. RECOMMENDATION: Screening mammogram in one year. (Code:SM-B-01Y) BI-RADS CATEGORY  1: Negative. Electronically Signed   By: Marcelino Duster  Thomasena Edis M.D.   On: 09/09/2019 15:41    Assessment & Plan:   Problem List Items Addressed This Visit      Unprioritized   B12 deficiency - Primary    Her level drops when she stops supplementing orally.  Will resume parental supplementation and check IF antibody with next blood draw in May/       Relevant Orders   Intrinsic Factor Antibodies   Hyperlipidemia LDL goal <130    Lengthy discussion about the risks and benefits of statin therapy.  She is taking a high potency statin once a week.   Lab Results  Component Value Date   CHOL 187 11/10/2019   HDL 52.10 11/10/2019   LDLCALC 108 (H) 11/10/2019   LDLDIRECT 116.0 02/17/2020   TRIG 138.0 11/10/2019   CHOLHDL 4 11/10/2019         Immunocompromised state due to drug therapy [facility])    Reviewed her need to avoid all live/attenuated  vaccines      Panic attack as reaction to stress    She uses alprazolam on average 3 times per month and declines therapy with SSRI . The risks and benefits of benzodiazepine use were reviewed with patient today including excessive sedation leading to respiratory depression,  impaired  thinking/driving, and addiction.  Patient was advised to avoid concurrent use with alcohol, to use medication only as needed and not to share with others  .       Sinusitis    Treating empirically with augmentin .  Advised to home  test for COVID       Relevant Medications   amoxicillin-clavulanate (AUGMENTIN) 875-125 MG tablet   predniSONE (DELTASONE) 10 MG tablet   Type 2 diabetes mellitus with neurological complications (HCC)    Fasting glucoses have been well under 125 but A1c remains under 7.0  She has had her annual eye exam .   she is now taking a baby aspirin and willing to start statin once a week  (has historically refused statin therapy) .   Taking ARB  given new onset hypertension with LVH noted on prior ECHO    Low GI/Mediterranean  diet and exercise recommended   Lab Results  Component Value Date   HGBA1C 6.9 (H) 02/17/2020   Lab Results  Component Value Date   MICROALBUR <0.7 02/17/2020            Vitamin D deficiency    Resume weekly megadose          I have changed Liborio Nixon Degraffenreid's Diclofenac Sodium CR. I am also having her start on ergocalciferol, amoxicillin-clavulanate, and predniSONE. Additionally, I am having her maintain her cetirizine, sodium chloride, Probiotic Product (PROBIOTIC COMPLEX ACIDOPHILUS PO), b complex vitamins, bimatoprost, albuterol, budesonide, MILK THISTLE PO, Cholecalciferol (VITAMIN D3 PO), B-12, EPINEPHrine, tildrakizumab-asmn, ALPRAZolam, telmisartan, ondansetron, atorvastatin, methocarbamol, and tiZANidine. We administered cyanocobalamin.  Meds ordered this encounter  Medications  . DISCONTD: cyanocobalamin (,VITAMIN B-12,) 1000 MCG/ML injection    Sig: Inject 1 mL (1,000 mcg total) into the muscle once a week.    Dispense:  1 mL    Refill:  11    PHARMACY TO GIVE WEEKLY STARTING FEB 10  FOR 3 SHOTS ,  THEN MONTHLY THEREAFTER  . ergocalciferol (DRISDOL) 1.25 MG (50000 UT) capsule    Sig: Take 1 capsule (50,000 Units total) by  mouth once a week.    Dispense:  12 capsule    Refill:  0  . Diclofenac Sodium CR 100 MG  24 hr tablet    Sig: Take 1 tablet (100 mg total) by mouth daily.    Dispense:  90 tablet    Refill:  1  . amoxicillin-clavulanate (AUGMENTIN) 875-125 MG tablet    Sig: Take 1 tablet by mouth 2 (two) times daily.    Dispense:  14 tablet    Refill:  0  . predniSONE (DELTASONE) 10 MG tablet    Sig: 6 tablets on Day 1 , then reduce by 1 tablet daily until gone    Dispense:  21 tablet    Refill:  0  . cyanocobalamin ((VITAMIN B-12)) injection 1,000 mcg    Medications Discontinued During This Encounter  Medication Reason  . Diclofenac Sodium CR 100 MG 24 hr tablet Reorder   I provided  30 minutes of  face-to-face time during this encounter reviewing patient's current problems and past surgeries, labs and imaging studies, providing counseling on the above mentioned problems , and coordination  of care .  Follow-up: Return in about 3 months (around 05/18/2020) for follow up diabetes.   Sherlene Shams, MD

## 2020-02-19 NOTE — Telephone Encounter (Signed)
Labs for june visit ordered.

## 2020-02-20 NOTE — Telephone Encounter (Signed)
Called and scheduled [name] for 06/09/2020 for labs. Roopa verbalized understanding and had no further questions.

## 2020-02-21 DIAGNOSIS — J329 Chronic sinusitis, unspecified: Secondary | ICD-10-CM | POA: Insufficient documentation

## 2020-02-21 NOTE — Assessment & Plan Note (Signed)
She uses alprazolam on average 3 times per month and declines therapy with SSRI . The risks and benefits of benzodiazepine use were reviewed with patient today including excessive sedation leading to respiratory depression,  impaired thinking/driving, and addiction.  Patient was advised to avoid concurrent use with alcohol, to use medication only as needed and not to share with others  .

## 2020-02-21 NOTE — Assessment & Plan Note (Signed)
Reviewed her need to avoid all live/attenuated  vaccines

## 2020-02-21 NOTE — Assessment & Plan Note (Signed)
Lengthy discussion about the risks and benefits of statin therapy.  She is taking a high potency statin once a week.   Lab Results  Component Value Date   CHOL 187 11/10/2019   HDL 52.10 11/10/2019   LDLCALC 108 (H) 11/10/2019   LDLDIRECT 116.0 02/17/2020   TRIG 138.0 11/10/2019   CHOLHDL 4 11/10/2019

## 2020-02-21 NOTE — Assessment & Plan Note (Signed)
Fasting glucoses have been well under 125 but A1c remains under 7.0  She has had her annual eye exam .   she is now taking a baby aspirin and willing to start statin once a week  (has historically refused statin therapy) .   Taking ARB  given new onset hypertension with LVH noted on prior ECHO    Low GI/Mediterranean  diet and exercise recommended   Lab Results  Component Value Date   HGBA1C 6.9 (H) 02/17/2020   Lab Results  Component Value Date   MICROALBUR <0.7 02/17/2020

## 2020-02-21 NOTE — Assessment & Plan Note (Signed)
Treating empirically with augmentin .  Advised to home  test for COVID

## 2020-02-21 NOTE — Assessment & Plan Note (Signed)
Resume weekly megadose

## 2020-02-21 NOTE — Assessment & Plan Note (Signed)
Her level drops when she stops supplementing orally.  Will resume parental supplementation and check IF antibody with next blood draw in May/

## 2020-02-26 ENCOUNTER — Ambulatory Visit (INDEPENDENT_AMBULATORY_CARE_PROVIDER_SITE_OTHER): Payer: Medicare Other

## 2020-02-26 ENCOUNTER — Other Ambulatory Visit: Payer: Self-pay

## 2020-02-26 DIAGNOSIS — E538 Deficiency of other specified B group vitamins: Secondary | ICD-10-CM | POA: Diagnosis not present

## 2020-02-26 MED ORDER — CYANOCOBALAMIN 1000 MCG/ML IJ SOLN
1000.0000 ug | Freq: Once | INTRAMUSCULAR | Status: AC
Start: 2020-02-26 — End: 2020-02-26
  Administered 2020-02-26: 1000 ug via INTRAMUSCULAR

## 2020-02-26 NOTE — Progress Notes (Signed)
Patient presented for B 12 injection to left deltoid, patient voiced no concerns nor showed any signs of distress during injection. 

## 2020-03-04 ENCOUNTER — Other Ambulatory Visit: Payer: Self-pay

## 2020-03-04 ENCOUNTER — Ambulatory Visit (INDEPENDENT_AMBULATORY_CARE_PROVIDER_SITE_OTHER): Payer: Medicare Other

## 2020-03-04 DIAGNOSIS — E538 Deficiency of other specified B group vitamins: Secondary | ICD-10-CM | POA: Diagnosis not present

## 2020-03-04 MED ORDER — CYANOCOBALAMIN 1000 MCG/ML IJ SOLN
1000.0000 ug | Freq: Once | INTRAMUSCULAR | Status: AC
  Administered 2020-03-04: 1000 ug via INTRAMUSCULAR

## 2020-03-04 NOTE — Progress Notes (Signed)
Patient presented for B 12 injection to right deltoid, patient voiced no concerns nor showed any signs of distress during injection. 

## 2020-03-12 ENCOUNTER — Ambulatory Visit (INDEPENDENT_AMBULATORY_CARE_PROVIDER_SITE_OTHER): Payer: Medicare Other

## 2020-03-12 ENCOUNTER — Other Ambulatory Visit: Payer: Self-pay

## 2020-03-12 DIAGNOSIS — E538 Deficiency of other specified B group vitamins: Secondary | ICD-10-CM

## 2020-03-12 MED ORDER — CYANOCOBALAMIN 1000 MCG/ML IJ SOLN
1000.0000 ug | Freq: Once | INTRAMUSCULAR | Status: AC
Start: 2020-03-12 — End: 2020-03-12
  Administered 2020-03-12: 1000 ug via INTRAMUSCULAR

## 2020-03-12 NOTE — Progress Notes (Signed)
Patient presented for B 12 injection to left deltoid, patient voiced no concerns nor showed any signs of distress during injection. 

## 2020-04-13 ENCOUNTER — Ambulatory Visit (INDEPENDENT_AMBULATORY_CARE_PROVIDER_SITE_OTHER): Payer: Medicare Other

## 2020-04-13 ENCOUNTER — Other Ambulatory Visit: Payer: Self-pay

## 2020-04-13 DIAGNOSIS — E538 Deficiency of other specified B group vitamins: Secondary | ICD-10-CM | POA: Diagnosis not present

## 2020-04-13 MED ORDER — CYANOCOBALAMIN 1000 MCG/ML IJ SOLN
1000.0000 ug | Freq: Once | INTRAMUSCULAR | Status: AC
  Administered 2020-04-13: 1000 ug via INTRAMUSCULAR

## 2020-04-13 NOTE — Progress Notes (Signed)
Patient presented for B 12 injection to right deltoid, patient voiced no concerns nor showed any signs of distress during injection. 

## 2020-05-05 ENCOUNTER — Other Ambulatory Visit: Payer: Self-pay | Admitting: Internal Medicine

## 2020-05-18 ENCOUNTER — Other Ambulatory Visit: Payer: Self-pay

## 2020-05-18 ENCOUNTER — Ambulatory Visit (INDEPENDENT_AMBULATORY_CARE_PROVIDER_SITE_OTHER): Payer: Medicare Other

## 2020-05-18 DIAGNOSIS — E538 Deficiency of other specified B group vitamins: Secondary | ICD-10-CM

## 2020-05-18 MED ORDER — CYANOCOBALAMIN 1000 MCG/ML IJ SOLN
1000.0000 ug | Freq: Once | INTRAMUSCULAR | Status: AC
  Administered 2020-05-18: 1000 ug via INTRAMUSCULAR

## 2020-05-18 NOTE — Progress Notes (Signed)
Patient presented for B 12 injection to right deltoid, patient voiced no concerns nor showed any signs of distress during injection. 

## 2020-06-09 ENCOUNTER — Other Ambulatory Visit (INDEPENDENT_AMBULATORY_CARE_PROVIDER_SITE_OTHER): Payer: Medicare Other

## 2020-06-09 ENCOUNTER — Other Ambulatory Visit: Payer: Self-pay

## 2020-06-09 DIAGNOSIS — K760 Fatty (change of) liver, not elsewhere classified: Secondary | ICD-10-CM

## 2020-06-09 DIAGNOSIS — E538 Deficiency of other specified B group vitamins: Secondary | ICD-10-CM | POA: Diagnosis not present

## 2020-06-09 DIAGNOSIS — E1149 Type 2 diabetes mellitus with other diabetic neurological complication: Secondary | ICD-10-CM

## 2020-06-09 DIAGNOSIS — E559 Vitamin D deficiency, unspecified: Secondary | ICD-10-CM

## 2020-06-09 DIAGNOSIS — E785 Hyperlipidemia, unspecified: Secondary | ICD-10-CM

## 2020-06-09 LAB — COMPREHENSIVE METABOLIC PANEL
ALT: 26 U/L (ref 0–35)
AST: 23 U/L (ref 0–37)
Albumin: 3.9 g/dL (ref 3.5–5.2)
Alkaline Phosphatase: 70 U/L (ref 39–117)
BUN: 17 mg/dL (ref 6–23)
CO2: 28 mEq/L (ref 19–32)
Calcium: 8.8 mg/dL (ref 8.4–10.5)
Chloride: 101 mEq/L (ref 96–112)
Creatinine, Ser: 0.86 mg/dL (ref 0.40–1.20)
GFR: 65.95 mL/min (ref >60.00)
Glucose, Bld: 151 mg/dL — ABNORMAL HIGH (ref 70–99)
Potassium: 4.3 mEq/L (ref 3.5–5.1)
Sodium: 137 mEq/L (ref 135–145)
Total Bilirubin: 1.3 mg/dL — ABNORMAL HIGH (ref 0.2–1.2)
Total Protein: 7.2 g/dL (ref 6.0–8.3)

## 2020-06-09 LAB — LIPID PANEL
Cholesterol: 185 mg/dL (ref 0–200)
HDL: 41.2 mg/dL (ref >39.00)
LDL Cholesterol: 105 mg/dL — ABNORMAL HIGH (ref 0–99)
NonHDL: 143.52
Total CHOL/HDL Ratio: 4
Triglycerides: 193 mg/dL — ABNORMAL HIGH (ref 0.0–149.0)
VLDL: 38.6 mg/dL (ref 0.0–40.0)

## 2020-06-09 LAB — HEMOGLOBIN A1C: Hgb A1c MFr Bld: 7.2 % — ABNORMAL HIGH (ref 4.6–6.5)

## 2020-06-09 LAB — TSH: TSH: 3.47 u[IU]/mL (ref 0.35–4.50)

## 2020-06-09 LAB — VITAMIN B12: Vitamin B-12: 314 pg/mL (ref 211–911)

## 2020-06-09 LAB — VITAMIN D 25 HYDROXY (VIT D DEFICIENCY, FRACTURES): VITD: 24.22 ng/mL — ABNORMAL LOW (ref 30.00–100.00)

## 2020-06-12 LAB — INTRINSIC FACTOR ANTIBODIES: Intrinsic Factor: NEGATIVE

## 2020-06-16 ENCOUNTER — Ambulatory Visit (INDEPENDENT_AMBULATORY_CARE_PROVIDER_SITE_OTHER): Payer: Medicare Other | Admitting: Internal Medicine

## 2020-06-16 ENCOUNTER — Other Ambulatory Visit: Payer: Self-pay

## 2020-06-16 DIAGNOSIS — G4733 Obstructive sleep apnea (adult) (pediatric): Secondary | ICD-10-CM

## 2020-06-16 DIAGNOSIS — E1169 Type 2 diabetes mellitus with other specified complication: Secondary | ICD-10-CM

## 2020-06-16 DIAGNOSIS — K760 Fatty (change of) liver, not elsewhere classified: Secondary | ICD-10-CM | POA: Diagnosis not present

## 2020-06-16 DIAGNOSIS — I739 Peripheral vascular disease, unspecified: Secondary | ICD-10-CM

## 2020-06-16 DIAGNOSIS — E1149 Type 2 diabetes mellitus with other diabetic neurological complication: Secondary | ICD-10-CM | POA: Diagnosis not present

## 2020-06-16 DIAGNOSIS — E538 Deficiency of other specified B group vitamins: Secondary | ICD-10-CM | POA: Diagnosis not present

## 2020-06-16 DIAGNOSIS — Z9989 Dependence on other enabling machines and devices: Secondary | ICD-10-CM

## 2020-06-16 DIAGNOSIS — E785 Hyperlipidemia, unspecified: Secondary | ICD-10-CM

## 2020-06-16 MED ORDER — METFORMIN HCL ER 500 MG PO TB24: 500.0000 mg | ORAL_TABLET | Freq: Every day | ORAL | 1 refills | Status: DC

## 2020-06-16 MED ORDER — ATORVASTATIN CALCIUM 20 MG PO TABS: 20.0000 mg | ORAL_TABLET | ORAL | 3 refills | Status: DC

## 2020-06-16 NOTE — Assessment & Plan Note (Addendum)
Managed without medications for years due to patient's strong preference to do so. Strongly recommending initiation of therapy with metformin

## 2020-06-16 NOTE — Progress Notes (Signed)
Subjective:  Patient ID: [name], female    DOB: January 21, 1945  Age: 75 y.o. MRN: [id number]  CC: Diagnoses of B12 deficiency, Fatty liver, Type 2 diabetes mellitus with neurological complications (HCC), OSA on CPAP, PAD (peripheral artery disease) (HCC), Morbid obesity (HCC), and Hyperlipidemia associated with type 2 diabetes mellitus (HCC) were pertinent to this visit.  HPI [name] presents for follow up on multiple issues including  type DM with neuropathy, fatty liver,  Obesity and OSA   This visit occurred during the SARS-CoV-2 public health emergency.  Safety protocols were in place, including screening questions prior to the visit, additional usage of staff PPE, and extensive cleaning of exam room while observing appropriate contact time as indicated for disinfecting solutions.   Mood is depressed,  Frustrated due to orthopedic issues that have been limiting her mobility. .  Still  having PF in right heel,  Did not respond to C/s injections.  Later on pulled a  Left  hamstring so has been unable to exercise. Using a walker  .  meloxicam raised BP go 197/113 so she has stopped it and using topical diclofenac and 1000 mg tylenol daily.  .     No longer cooking for family anymore unless it is crock pot basedl    HTN:  BP s ranging from 111/60 to 140/79.    DM/Fatty liver:  Strongly recommending atorvastatin and metformin .  She tolerated once weekly atorvastatin  But declines to increase dose to twice weekly   Obesity:   Outpatient Medications Prior to Visit  Medication Sig Dispense Refill  . albuterol (VENTOLIN HFA) 108 (90 Base) MCG/ACT inhaler Inhale 2 puffs into the lungs every 6 (six) hours as needed for wheezing or shortness of breath.    . ALPRAZolam (XANAX) 0.25 MG tablet Take 1 tablet (0.25 mg total) by mouth 2 (two) times daily as needed for anxiety. 30 tablet 0  . b complex vitamins tablet Take 1 tablet by mouth daily.    . bimatoprost (LUMIGAN) 0.03 % ophthalmic solution  Place 1 drop into both eyes at bedtime.    . budesonide (PULMICORT) 180 MCG/ACT inhaler Inhale 2 puffs into the lungs 2 (two) times daily.    . cetirizine (ZYRTEC) 10 MG tablet Take 10 mg by mouth daily.    . Diclofenac Sodium CR 100 MG 24 hr tablet Take 1 tablet (100 mg total) by mouth daily. 90 tablet 1  . EPINEPHrine 0.3 mg/0.3 mL IJ SOAJ injection See admin instructions.    Marland Kitchen MILK THISTLE PO Take 1 capsule by mouth daily.     . ondansetron (ZOFRAN) 4 MG tablet Take 1 tablet (4 mg total) by mouth every 8 (eight) hours as needed for nausea or vomiting. 30 tablet 0  . Probiotic Product (PROBIOTIC COMPLEX ACIDOPHILUS PO) Take 1 capsule by mouth daily.    . sodium chloride (OCEAN) 0.65 % SOLN nasal spray Place 1 spray into the nose as needed.    Marland Kitchen telmisartan (MICARDIS) 20 MG tablet TAKE 1 TABLET BY MOUTH EVERY DAY 90 tablet 3  . tildrakizumab-asmn (ILUMYA) 100 MG/ML subcutaneous injection Inject 100 mg into the skin every 3 (three) months.    Marland Kitchen amoxicillin-clavulanate (AUGMENTIN) 875-125 MG tablet Take 1 tablet by mouth 2 (two) times daily. (Patient not taking: Reported on 06/16/2020) 14 tablet 0  . atorvastatin (LIPITOR) 20 MG tablet TAKE 1 TABLET (20 MG TOTAL) BY MOUTH ONCE A WEEK. (Patient not taking: Reported on 06/16/2020) 12 tablet 0  .  Cholecalciferol (VITAMIN D3 PO) Take 2,000 Units by mouth daily. (Patient not taking: Reported on 06/16/2020)    . cyanocobalamin (,VITAMIN B-12,) 1000 MCG/ML injection INJECT 1 ML (1,000 MCG TOTAL) INTO THE MUSCLE ONCE A WEEK. (Patient not taking: Reported on 06/16/2020) 1 mL 11  . Cyanocobalamin (B-12) 2500 MCG SUBL Place 1 tablet under the tongue once a week. (Patient not taking: Reported on 06/16/2020)    . ergocalciferol (DRISDOL) 1.25 MG (50000 UT) capsule Take 1 capsule (50,000 Units total) by mouth once a week. (Patient not taking: Reported on 06/16/2020) 12 capsule 0  . LUMIGAN 0.01 % SOLN 1 drop at bedtime. (Patient not taking: Reported on 06/16/2020)    .  methocarbamol (ROBAXIN) 750 MG tablet methocarbamol 750 mg tablet  TAKE 1 TABLET BY MOUTH AT BEDTIME (Patient not taking: Reported on 06/16/2020)    . predniSONE (DELTASONE) 10 MG tablet 6 tablets on Day 1 , then reduce by 1 tablet daily until gone (Patient not taking: Reported on 06/16/2020) 21 tablet 0  . tiZANidine (ZANAFLEX) 4 MG tablet tizanidine 4 mg tablet  TAKE 1 TABLET BY MOUTH EVERYDAY AT BEDTIME (Patient not taking: Reported on 06/16/2020)     No facility-administered medications prior to visit.    Review of Systems;  Patient denies headache, fevers, malaise, unintentional weight loss, skin rash, eye pain, sinus congestion and sinus pain, sore throat, dysphagia,  hemoptysis , cough, dyspnea, wheezing, chest pain, palpitations, orthopnea, edema, abdominal pain, nausea, melena, diarrhea, constipation, flank pain, dysuria, hematuria, urinary  Frequency, nocturia, numbness, tingling, seizures,  Focal weakness, Loss of consciousness,  Tremor, insomnia, depression, anxiety, and suicidal ideation.      Objective:  BP (!) 154/84 (BP Location: Left Arm, Patient Position: Sitting, Cuff Size: Large)   Pulse 78   Temp (!) 97 F (36.1 C) (Temporal)   Resp 15   Ht 5\' 5"  (1.651 m)   Wt 231 lb 3.2 oz (104.9 kg)   SpO2 96%   BMI 38.47 kg/m   BP Readings from Last 3 Encounters:  06/16/20 (!) 154/84  02/19/20 139/75  11/19/19 132/80    Wt Readings from Last 3 Encounters:  06/16/20 231 lb 3.2 oz (104.9 kg)  02/19/20 228 lb 9.6 oz (103.7 kg)  11/19/19 224 lb 9.6 oz (101.9 kg)    General appearance: alert, cooperative and appears stated age Ears: normal TM's and external ear canals both ears Throat: lips, mucosa, and tongue normal; teeth and gums normal Neck: no adenopathy, no carotid bruit, supple, symmetrical, trachea midline and thyroid not enlarged, symmetric, no tenderness/mass/nodules Back: symmetric, no curvature. ROM normal. No CVA tenderness. Lungs: clear to auscultation  bilaterally Heart: regular rate and rhythm, S1, S2 normal, no murmur, click, rub or gallop Abdomen: soft, non-tender; bowel sounds normal; no masses,  no organomegaly Pulses: 2+ and symmetric Skin: Skin color, texture, turgor normal. No rashes or lesions Lymph nodes: Cervical, supraclavicular, and axillary nodes normal.  Lab Results  Component Value Date   HGBA1C 7.2 (H) 06/09/2020   HGBA1C 6.9 (H) 02/17/2020   HGBA1C 7.0 (H) 11/10/2019    Lab Results  Component Value Date   CREATININE 0.86 06/09/2020   CREATININE 0.81 02/17/2020   CREATININE 0.80 11/10/2019    Lab Results  Component Value Date   WBC 11.2 (H) 05/26/2019   HGB 13.9 05/26/2019   HCT 41.1 05/26/2019   PLT 332.0 05/26/2019   GLUCOSE 151 (H) 06/09/2020   CHOL 185 06/09/2020   TRIG 193.0 (H) 06/09/2020  HDL 41.20 06/09/2020   LDLDIRECT 116.0 02/17/2020   LDLCALC 105 (H) 06/09/2020   ALT 26 06/09/2020   AST 23 06/09/2020   NA 137 06/09/2020   K 4.3 06/09/2020   CL 101 06/09/2020   CREATININE 0.86 06/09/2020   BUN 17 06/09/2020   CO2 28 06/09/2020   TSH 3.47 06/09/2020   INR 1.1 07/27/2007   HGBA1C 7.2 (H) 06/09/2020   MICROALBUR <0.7 02/17/2020    DG Foot Complete Right  Result Date: 09/10/2019 Please see detailed radiograph report in office note.  MM 3D SCREEN BREAST BILATERAL  Result Date: 09/09/2019 CLINICAL DATA:  Screening. EXAM: DIGITAL SCREENING BILATERAL MAMMOGRAM WITH TOMO AND CAD COMPARISON:  Previous exam(s). ACR Breast Density Category b: There are scattered areas of fibroglandular density. FINDINGS: There are no findings suspicious for malignancy. Images were processed with CAD. IMPRESSION: No mammographic evidence of malignancy. A result letter of this screening mammogram will be mailed directly to the patient. RECOMMENDATION: Screening mammogram in one year. (Code:SM-B-01Y) BI-RADS CATEGORY  1: Negative. Electronically Signed   By: Frederico HammanMichelle  Collins M.D.   On: 09/09/2019 15:41     Assessment & Plan:   Problem List Items Addressed This Visit      Unprioritized   B12 deficiency    REMAINS LOW,  BU IF AB IS NEGATIVE.  Advised to resume oral supplements      Fatty liver    T BILI remains mildly elevated but hepatic enzymes are normal.  She has been unable to lose weight due to orthopedic issues.  Metformin advised   Lab Results  Component Value Date   ALT 26 06/09/2020   AST 23 06/09/2020   ALKPHOS 70 06/09/2020   BILITOT 1.3 (H) 06/09/2020         Hyperlipidemia associated with type 2 diabetes mellitus (HCC)    Lengthy discussion repeated regarding  the risks and benefits of statin therapy.  She has stopped taking her high potency statin once a week but strongly advised to resume it and remains contemplative   Lab Results  Component Value Date   CHOL 185 06/09/2020   HDL 41.20 06/09/2020   LDLCALC 105 (H) 06/09/2020   LDLDIRECT 116.0 02/17/2020   TRIG 193.0 (H) 06/09/2020   CHOLHDL 4 06/09/2020         Relevant Medications   metFORMIN (GLUCOPHAGE XR) 500 MG 24 hr tablet   atorvastatin (LIPITOR) 20 MG tablet   Morbid obesity (HCC)   Relevant Medications   metFORMIN (GLUCOPHAGE XR) 500 MG 24 hr tablet   OSA on CPAP    Diagnosed by sleep study. She is wearing her CPAP on a setting of 10.4  With a Res Med machine every night a minimum of 6 hours per night and notes persistent fatigue .  She is neAdvised to avoid new apparatus recommended by dentist, continue use of CPAP and increase efforts to lose weight       Relevant Orders   For home use only DME continuous positive airway pressure (CPAP)   PAD (peripheral artery disease) (HCC)   Relevant Medications   atorvastatin (LIPITOR) 20 MG tablet   Type 2 diabetes mellitus with neurological complications (HCC)    Managed without medications for years due to patient's strong preference to do so. Strongly recommending initiation of therapy with metformin       Relevant Medications   metFORMIN  (GLUCOPHAGE XR) 500 MG 24 hr tablet   atorvastatin (LIPITOR) 20 MG tablet  A total of 40 minutes was spent with patient more than half of which was spent in counseling patient on  Her sub optimal management of diabetes, hyperlipidemia and fatty liver  mentioned issues , reviewing and explaining recent labs and imaging studies done, and coordination of care.  I have discontinued Liborio Nixon Dineen's Cholecalciferol (VITAMIN D3 PO), B-12, methocarbamol, tiZANidine, ergocalciferol, amoxicillin-clavulanate, predniSONE, and cyanocobalamin. I am also having her start on metFORMIN. Additionally, I am having her maintain her cetirizine, sodium chloride, Probiotic Product (PROBIOTIC COMPLEX ACIDOPHILUS PO), b complex vitamins, bimatoprost, albuterol, budesonide, MILK THISTLE PO, EPINEPHrine, tildrakizumab-asmn, ALPRAZolam, telmisartan, ondansetron, Diclofenac Sodium CR, and atorvastatin.  Meds ordered this encounter  Medications  . metFORMIN (GLUCOPHAGE XR) 500 MG 24 hr tablet    Sig: Take 1 tablet (500 mg total) by mouth daily after supper.    Dispense:  90 tablet    Refill:  1  . atorvastatin (LIPITOR) 20 MG tablet    Sig: Take 1 tablet (20 mg total) by mouth once a week.    Dispense:  12 tablet    Refill:  3    Medications Discontinued During This Encounter  Medication Reason  . amoxicillin-clavulanate (AUGMENTIN) 875-125 MG tablet   . predniSONE (DELTASONE) 10 MG tablet   . LUMIGAN 0.01 % SOLN Duplicate  . atorvastatin (LIPITOR) 20 MG tablet Reorder  . Cholecalciferol (VITAMIN D3 PO)   . cyanocobalamin (,VITAMIN B-12,) 1000 MCG/ML injection   . ergocalciferol (DRISDOL) 1.25 MG (50000 UT) capsule   . methocarbamol (ROBAXIN) 750 MG tablet   . tiZANidine (ZANAFLEX) 4 MG tablet   . Cyanocobalamin (B-12) 2500 MCG SUBL     Follow-up: Return in about 3 months (around 09/16/2020) for follow up diabetes.   Sherlene Shams, MD

## 2020-06-16 NOTE — Patient Instructions (Addendum)
I would like you to resume once weekly atorvastatin.  And continue it if you tolerate it.    I would like you to start taking metformin XR 500 mg daily with dinner  .  This is because you have diabetes and fatty liver. It may hep you lose weight by curbing your appetite    Try the premier protein  Shakes  Instead of skipping breakfast   I WOULD LIKE YOU TO RESUME ATORVASTATIN ONCE A WEEK  TO REDUCE YOUR RISK OF HEART ATTACK AND STROKE.  IF YOU DEVELOP MUSCLE PAIN , STOP THE MEDICATION AND NOTIFY ME AT ONCE

## 2020-06-16 NOTE — Assessment & Plan Note (Addendum)
T BILI remains mildly elevated but hepatic enzymes are normal.  She has been unable to lose weight due to orthopedic issues.  Metformin advised   Lab Results  Component Value Date   ALT 26 06/09/2020   AST 23 06/09/2020   ALKPHOS 70 06/09/2020   BILITOT 1.3 (H) 06/09/2020

## 2020-06-16 NOTE — Assessment & Plan Note (Addendum)
REMAINS LOW,  BU IF AB IS NEGATIVE.  Advised to resume oral supplements

## 2020-06-16 NOTE — Assessment & Plan Note (Signed)
Diagnosed by sleep study. She is wearing her CPAP on a setting of 10.4  With a Res Med machine every night a minimum of 6 hours per night and notes persistent fatigue .  She is neAdvised to avoid new apparatus recommended by dentist, continue use of CPAP and increase efforts to lose weight

## 2020-06-17 ENCOUNTER — Encounter: Payer: Self-pay | Admitting: Internal Medicine

## 2020-06-17 NOTE — Assessment & Plan Note (Addendum)
Lengthy discussion repeated regarding  the risks and benefits of statin therapy.  She has stopped taking her high potency statin once a week but strongly advised to resume it and remains contemplative   Lab Results  Component Value Date   CHOL 185 06/09/2020   HDL 41.20 06/09/2020   LDLCALC 105 (H) 06/09/2020   LDLDIRECT 116.0 02/17/2020   TRIG 193.0 (H) 06/09/2020   CHOLHDL 4 06/09/2020

## 2020-07-14 ENCOUNTER — Ambulatory Visit: Payer: Medicare Other | Admitting: Podiatry

## 2020-07-16 ENCOUNTER — Telehealth: Payer: Self-pay | Admitting: Internal Medicine

## 2020-07-16 NOTE — Telephone Encounter (Addendum)
Patient has been notified. She declined to move appointment up closer.

## 2020-07-16 NOTE — Telephone Encounter (Signed)
PT called in to state that their atorvastatin (LIPITOR) 20 MG tablet and metFORMIN (GLUCOPHAGE XR) 500 MG 24 hr tablet are causing them some issues and were wanting to know if they can cold [place] them or slowly stop taking them.

## 2020-07-29 ENCOUNTER — Other Ambulatory Visit: Payer: Self-pay | Admitting: Internal Medicine

## 2020-07-29 DIAGNOSIS — Z1231 Encounter for screening mammogram for malignant neoplasm of breast: Secondary | ICD-10-CM

## 2020-08-11 ENCOUNTER — Telehealth: Payer: Self-pay | Admitting: Internal Medicine

## 2020-08-11 DIAGNOSIS — G4733 Obstructive sleep apnea (adult) (pediatric): Secondary | ICD-10-CM

## 2020-08-11 NOTE — Telephone Encounter (Signed)
Patient would like to have labs done before her 09/16/2020 appointment. No labs in chart. Let me know if I need to call and schedule labs.

## 2020-08-11 NOTE — Telephone Encounter (Signed)
Pt just had labs done on 06/09/2020. Does she need labs again before her appt on 09/16/2020?

## 2020-08-12 ENCOUNTER — Other Ambulatory Visit: Payer: Self-pay | Admitting: Internal Medicine

## 2020-08-12 DIAGNOSIS — E1169 Type 2 diabetes mellitus with other specified complication: Secondary | ICD-10-CM

## 2020-08-12 DIAGNOSIS — E1149 Type 2 diabetes mellitus with other diabetic neurological complication: Secondary | ICD-10-CM

## 2020-08-12 DIAGNOSIS — E785 Hyperlipidemia, unspecified: Secondary | ICD-10-CM

## 2020-08-12 NOTE — Telephone Encounter (Signed)
Pt is aware that referral has been placed.  

## 2020-08-12 NOTE — Telephone Encounter (Signed)
Spoke with pt and scheduled her for a fasting lab appt. While on phone with pt she stated that she is scheduled to see Dr. Prescott Parma, Pulmonologist on 10/28/2020 but they are needing a new referral. Fax number is [phone].

## 2020-08-12 NOTE — Telephone Encounter (Signed)
Done

## 2020-08-12 NOTE — Addendum Note (Signed)
Addended by: Sherlene Shams on: 08/12/2020 02:55 PM   Modules accepted: Orders

## 2020-09-13 ENCOUNTER — Other Ambulatory Visit: Payer: Self-pay

## 2020-09-13 ENCOUNTER — Other Ambulatory Visit (INDEPENDENT_AMBULATORY_CARE_PROVIDER_SITE_OTHER): Payer: Medicare Other

## 2020-09-13 DIAGNOSIS — E1149 Type 2 diabetes mellitus with other diabetic neurological complication: Secondary | ICD-10-CM

## 2020-09-13 DIAGNOSIS — E1169 Type 2 diabetes mellitus with other specified complication: Secondary | ICD-10-CM | POA: Diagnosis not present

## 2020-09-13 DIAGNOSIS — E785 Hyperlipidemia, unspecified: Secondary | ICD-10-CM

## 2020-09-13 LAB — COMPREHENSIVE METABOLIC PANEL
ALT: 33 U/L (ref 0–35)
AST: 27 U/L (ref 0–37)
Albumin: 3.9 g/dL (ref 3.5–5.2)
Alkaline Phosphatase: 72 U/L (ref 39–117)
BUN: 13 mg/dL (ref 6–23)
CO2: 25 mEq/L (ref 19–32)
Calcium: 9.1 mg/dL (ref 8.4–10.5)
Chloride: 101 mEq/L (ref 96–112)
Creatinine, Ser: 0.91 mg/dL (ref 0.40–1.20)
GFR: 61.51 mL/min (ref >60.00)
Glucose, Bld: 140 mg/dL — ABNORMAL HIGH (ref 70–99)
Potassium: 4.6 mEq/L (ref 3.5–5.1)
Sodium: 138 mEq/L (ref 135–145)
Total Bilirubin: 1.1 mg/dL (ref 0.2–1.2)
Total Protein: 7 g/dL (ref 6.0–8.3)

## 2020-09-13 LAB — LIPID PANEL
Cholesterol: 198 mg/dL (ref 0–200)
HDL: 47.7 mg/dL (ref >39.00)
LDL Cholesterol: 115 mg/dL — ABNORMAL HIGH (ref 0–99)
NonHDL: 149.85
Total CHOL/HDL Ratio: 4
Triglycerides: 174 mg/dL — ABNORMAL HIGH (ref 0.0–149.0)
VLDL: 34.8 mg/dL (ref 0.0–40.0)

## 2020-09-13 LAB — HEMOGLOBIN A1C: Hgb A1c MFr Bld: 6.6 % — ABNORMAL HIGH (ref 4.6–6.5)

## 2020-09-16 ENCOUNTER — Ambulatory Visit (INDEPENDENT_AMBULATORY_CARE_PROVIDER_SITE_OTHER): Payer: Medicare Other | Admitting: Internal Medicine

## 2020-09-16 ENCOUNTER — Other Ambulatory Visit: Payer: Self-pay

## 2020-09-16 ENCOUNTER — Encounter: Payer: Self-pay | Admitting: Internal Medicine

## 2020-09-16 VITALS — BP 130/98 | HR 88 | Temp 96.5°F | Ht 65.0 in | Wt 222.2 lb

## 2020-09-16 DIAGNOSIS — E785 Hyperlipidemia, unspecified: Secondary | ICD-10-CM

## 2020-09-16 DIAGNOSIS — E1169 Type 2 diabetes mellitus with other specified complication: Secondary | ICD-10-CM

## 2020-09-16 DIAGNOSIS — M791 Myalgia, unspecified site: Secondary | ICD-10-CM | POA: Diagnosis not present

## 2020-09-16 DIAGNOSIS — E538 Deficiency of other specified B group vitamins: Secondary | ICD-10-CM

## 2020-09-16 DIAGNOSIS — R010 Benign and innocent cardiac murmurs: Secondary | ICD-10-CM | POA: Diagnosis not present

## 2020-09-16 DIAGNOSIS — Z23 Encounter for immunization: Secondary | ICD-10-CM

## 2020-09-16 DIAGNOSIS — I700 Atherosclerosis of aorta: Secondary | ICD-10-CM

## 2020-09-16 DIAGNOSIS — T466X5A Adverse effect of antihyperlipidemic and antiarteriosclerotic drugs, initial encounter: Secondary | ICD-10-CM

## 2020-09-16 DIAGNOSIS — E1149 Type 2 diabetes mellitus with other diabetic neurological complication: Secondary | ICD-10-CM

## 2020-09-16 DIAGNOSIS — K760 Fatty (change of) liver, not elsewhere classified: Secondary | ICD-10-CM

## 2020-09-16 DIAGNOSIS — I10 Essential (primary) hypertension: Secondary | ICD-10-CM

## 2020-09-16 DIAGNOSIS — R059 Cough, unspecified: Secondary | ICD-10-CM

## 2020-09-16 NOTE — Patient Instructions (Signed)
Congratulations on the weight loss!  Your diabetes is now under excellent control!  I will see you in 6 months   Reminders:  You are due to repeat the Cologuard test this year

## 2020-09-16 NOTE — Assessment & Plan Note (Signed)
She has self referred to an outside pulmnologist  DR Sabino Snipes for rule out IPF due.  She has asthma and OSA>

## 2020-09-16 NOTE — Assessment & Plan Note (Signed)
Noted on 2019 CT abdoen and reviewed with patient today.  Unfortunately she is statin intolerant she declines alternative therpay today  Lab Results  Component Value Date   CHOL 198 09/13/2020   HDL 47.70 09/13/2020   LDLCALC 115 (H) 09/13/2020   LDLDIRECT 116.0 02/17/2020   TRIG 174.0 (H) 09/13/2020   CHOLHDL 4 09/13/2020

## 2020-09-16 NOTE — Assessment & Plan Note (Signed)
Home readings remain < 130/80 on telmisartan 20 mg daily

## 2020-09-16 NOTE — Progress Notes (Signed)
Subjective:  Patient ID: [name], female    DOB: [birth year]  Age: 75 y.o. MRN: [id number]  CC: The primary encounter diagnosis was Type 2 diabetes mellitus with neurological complications (HCC). Diagnoses of Morbid obesity (HCC), Myalgia due to statin, Benign heart murmur, Cough in adult, Hyperlipidemia associated with type 2 diabetes mellitus (HCC), B12 deficiency, White coat syndrome with diagnosis of hypertension, Abdominal aortic atherosclerosis (HCC), Need for immunization against influenza, and Fatty liver were also pertinent to this visit.  HPI [name] presents for  Chief Complaint  Patient presents with   Follow-up    Diabetes   Diabetes with obesity/htn/fatty liver  : losing weight  due to loss of appetite/metallic taste in mouth since starting metformin .  Wants to keep losing.  Goal was 150 lbs,  last time she was 150  was in 2001.  BP continues to be elevated in office. But under 130/80 consistently at home  Taking telmisartan 20 mg   Rare use of zofran for diarrhea upset stomach after eating fried food , salad and cole slaw.  Denies RUQ pain   Used dicofenac   Aortic atherosclerosis:  she has a Statin myalgia with lipitor trial  resulting in muscle weakness and near fall  . Does not want to repeat a trial regardless of the risk of stroke .  Outpatient Medications Prior to Visit  Medication Sig Dispense Refill   albuterol (VENTOLIN HFA) 108 (90 Base) MCG/ACT inhaler Inhale 2 puffs into the lungs every 6 (six) hours as needed for wheezing or shortness of breath.     ALPRAZolam (XANAX) 0.25 MG tablet Take 1 tablet (0.25 mg total) by mouth 2 (two) times daily as needed for anxiety. 30 tablet 0   b complex vitamins tablet Take 1 tablet by mouth daily.     bimatoprost (LUMIGAN) 0.03 % ophthalmic solution Place 1 drop into both eyes at bedtime.     budesonide (PULMICORT) 180 MCG/ACT inhaler Inhale 2 puffs into the lungs 2 (two) times daily.     cetirizine (ZYRTEC) 10 MG  tablet Take 10 mg by mouth daily.     Diclofenac Sodium CR 100 MG 24 hr tablet TAKE 1 TABLET BY MOUTH EVERY DAY 90 tablet 1   EPINEPHrine 0.3 mg/0.3 mL IJ SOAJ injection See admin instructions.     metFORMIN (GLUCOPHAGE XR) 500 MG 24 hr tablet Take 1 tablet (500 mg total) by mouth daily after supper. 90 tablet 1   MILK THISTLE PO Take 1 capsule by mouth daily.      ondansetron (ZOFRAN) 4 MG tablet Take 1 tablet (4 mg total) by mouth every 8 (eight) hours as needed for nausea or vomiting. 30 tablet 0   Probiotic Product (PROBIOTIC COMPLEX ACIDOPHILUS PO) Take 1 capsule by mouth daily.     sodium chloride (OCEAN) 0.65 % SOLN nasal spray Place 1 spray into the nose as needed.     telmisartan (MICARDIS) 20 MG tablet TAKE 1 TABLET BY MOUTH EVERY DAY 90 tablet 3   tildrakizumab-asmn (ILUMYA) 100 MG/ML subcutaneous injection Inject 100 mg into the skin every 3 (three) months.     atorvastatin (LIPITOR) 20 MG tablet Take 1 tablet (20 mg total) by mouth once a week. (Patient not taking: Reported on 09/16/2020) 12 tablet 3   No facility-administered medications prior to visit.    Review of Systems;  Patient denies headache, fevers, malaise, unintentional weight loss, skin rash, eye pain, sinus congestion and sinus pain, sore throat, dysphagia,  hemoptysis , cough, dyspnea, wheezing, chest pain, palpitations, orthopnea, edema, abdominal pain, nausea, melena, diarrhea, constipation, flank pain, dysuria, hematuria, urinary  Frequency, nocturia, numbness, tingling, seizures,  Focal weakness, Loss of consciousness,  Tremor, insomnia, depression, anxiety, and suicidal ideation.      Objective:  BP (!) 130/98 (BP Location: Left Arm, Patient Position: Sitting, Cuff Size: Large)   Pulse 88   Temp (!) 96.5 F (35.8 C) (Temporal)   Ht 5\' 5"  (1.651 m)   Wt 222 lb 3.2 oz (100.8 kg)   SpO2 97%   BMI 36.98 kg/m   BP Readings from Last 3 Encounters:  09/16/20 (!) 130/98  06/16/20 (!) 154/84  02/19/20 139/75     Wt Readings from Last 3 Encounters:  09/16/20 222 lb 3.2 oz (100.8 kg)  06/16/20 231 lb 3.2 oz (104.9 kg)  02/19/20 228 lb 9.6 oz (103.7 kg)    General appearance: alert, cooperative and appears stated age Ears: normal TM's and external ear canals both ears Throat: lips, mucosa, and tongue normal; teeth and gums normal Neck: no adenopathy, no carotid bruit, supple, symmetrical, trachea midline and thyroid not enlarged, symmetric, no tenderness/mass/nodules Back: symmetric, no curvature. ROM normal. No CVA tenderness. Lungs: clear to auscultation bilaterally Heart: regular rate and rhythm, S1, S2 normal, no murmur, click, rub or gallop Abdomen: soft, non-tender; bowel sounds normal; no masses,  no organomegaly Pulses: 2+ and symmetric Skin: Skin color, texture, turgor normal. No rashes or lesions Lymph nodes: Cervical, supraclavicular, and axillary nodes normal.  Lab Results  Component Value Date   HGBA1C 6.6 (H) 09/13/2020   HGBA1C 7.2 (H) 06/09/2020   HGBA1C 6.9 (H) 02/17/2020    Lab Results  Component Value Date   CREATININE 0.91 09/13/2020   CREATININE 0.86 06/09/2020   CREATININE 0.81 02/17/2020    Lab Results  Component Value Date   WBC 11.2 (H) 05/26/2019   HGB 13.9 05/26/2019   HCT 41.1 05/26/2019   PLT 332.0 05/26/2019   GLUCOSE 140 (H) 09/13/2020   CHOL 198 09/13/2020   TRIG 174.0 (H) 09/13/2020   HDL 47.70 09/13/2020   LDLDIRECT 116.0 02/17/2020   LDLCALC 115 (H) 09/13/2020   ALT 33 09/13/2020   AST 27 09/13/2020   NA 138 09/13/2020   K 4.6 09/13/2020   CL 101 09/13/2020   CREATININE 0.91 09/13/2020   BUN 13 09/13/2020   CO2 25 09/13/2020   TSH 3.47 06/09/2020   INR 1.1 07/27/2007   HGBA1C 6.6 (H) 09/13/2020   MICROALBUR <0.7 02/17/2020    DG Foot Complete Right  Result Date: 09/10/2019 Please see detailed radiograph report in office note.  MM 3D SCREEN BREAST BILATERAL  Result Date: 09/09/2019 CLINICAL DATA:  Screening. EXAM: DIGITAL  SCREENING BILATERAL MAMMOGRAM WITH TOMO AND CAD COMPARISON:  Previous exam(s). ACR Breast Density Category b: There are scattered areas of fibroglandular density. FINDINGS: There are no findings suspicious for malignancy. Images were processed with CAD. IMPRESSION: No mammographic evidence of malignancy. A result letter of this screening mammogram will be mailed directly to the patient. RECOMMENDATION: Screening mammogram in one year. (Code:SM-B-01Y) BI-RADS CATEGORY  1: Negative. Electronically Signed   By: 09/11/2019 M.D.   On: 09/09/2019 15:41    Assessment & Plan:   Problem List Items Addressed This Visit       Unprioritized   Fatty liver    T BILI remains mildly elevated but hepatic enzymes are normal.  She has been unable to lose weight due to orthopedic  issues.  Metformin advised   Lab Results  Component Value Date   ALT 33 09/13/2020   AST 27 09/13/2020   ALKPHOS 72 09/13/2020   BILITOT 1.1 09/13/2020         B12 deficiency   Relevant Orders   Vitamin B12   Hyperlipidemia associated with type 2 diabetes mellitus (HCC)    Lengthy discussion repeated regarding  the risks and benefits of statin therapy.  She has statin myalgia and not willing to consider taking it once or twice weekly  Lab Results  Component Value Date   CHOL 198 09/13/2020   HDL 47.70 09/13/2020   LDLCALC 115 (H) 09/13/2020   LDLDIRECT 116.0 02/17/2020   TRIG 174.0 (H) 09/13/2020   CHOLHDL 4 09/13/2020         Relevant Orders   Lipid panel   Type 2 diabetes mellitus with neurological complications (HCC) - Primary    Managed with metformin which is resulting in weight loss  Lab Results  Component Value Date   HGBA1C 6.6 (H) 09/13/2020   Lab Results  Component Value Date   MICROALBUR <0.7 02/17/2020   MICROALBUR 1.6 03/19/2017           Relevant Orders   Comprehensive metabolic panel   Lipid panel   LDL cholesterol, direct   Hemoglobin A1c   Morbid obesity (HCC)    With fatty  liver,  Diabetes and hypertension.  She has lost 9 lbs since her last visit       White coat syndrome with diagnosis of hypertension    Home readings remain < 130/80 on telmisartan 20 mg daily       Myalgia due to statin   Benign heart murmur    declines ECHO.  Murmur is chronic and longstanding       Cough in adult    She has self referred to an outside pulmnologist  DR Sabino Snipes for rule out IPF due.  She has asthma and OSA>      Abdominal aortic atherosclerosis (HCC)    Noted on 2019 CT abdoen and reviewed with patient today.  Unfortunately she is statin intolerant she declines alternative therpay today  Lab Results  Component Value Date   CHOL 198 09/13/2020   HDL 47.70 09/13/2020   LDLCALC 115 (H) 09/13/2020   LDLDIRECT 116.0 02/17/2020   TRIG 174.0 (H) 09/13/2020   CHOLHDL 4 09/13/2020         Other Visit Diagnoses     Need for immunization against influenza       Relevant Orders   Flu Vaccine QUAD High Dose(Fluad) (Completed)      I provided  30 minutes of  face-to-face time during this encounter reviewing patient's current problems and past surgeries, labs and imaging studies, providing counseling on the above mentioned problems , and coordination  of care .   Follow-up: Return in about 6 months (around 03/16/2021) for follow up diabetes.   Sherlene Shams, MD

## 2020-09-16 NOTE — Assessment & Plan Note (Signed)
With fatty liver,  Diabetes and hypertension.  She has lost 9 lbs since her last visit

## 2020-09-16 NOTE — Assessment & Plan Note (Signed)
declines ECHO.  Murmur is chronic and longstanding

## 2020-09-19 NOTE — Assessment & Plan Note (Signed)
T BILI remains mildly elevated but hepatic enzymes are normal.  She has been unable to lose weight due to orthopedic issues.  Metformin advised   Lab Results  Component Value Date   ALT 33 09/13/2020   AST 27 09/13/2020   ALKPHOS 72 09/13/2020   BILITOT 1.1 09/13/2020

## 2020-09-19 NOTE — Assessment & Plan Note (Signed)
Lengthy discussion repeated regarding  the risks and benefits of statin therapy.  She has statin myalgia and not willing to consider taking it once or twice weekly  Lab Results  Component Value Date   CHOL 198 09/13/2020   HDL 47.70 09/13/2020   LDLCALC 115 (H) 09/13/2020   LDLDIRECT 116.0 02/17/2020   TRIG 174.0 (H) 09/13/2020   CHOLHDL 4 09/13/2020

## 2020-09-19 NOTE — Assessment & Plan Note (Signed)
Managed with metformin which is resulting in weight loss  Lab Results  Component Value Date   HGBA1C 6.6 (H) 09/13/2020   Lab Results  Component Value Date   MICROALBUR <0.7 02/17/2020   MICROALBUR 1.6 03/19/2017

## 2020-09-23 ENCOUNTER — Other Ambulatory Visit: Payer: Self-pay

## 2020-09-23 ENCOUNTER — Ambulatory Visit
Admission: RE | Admit: 2020-09-23 | Discharge: 2020-09-23 | Disposition: A | Discharge status: Home / Self Care | Payer: Medicare Other | Source: Ambulatory Visit | Attending: Internal Medicine | Admitting: Internal Medicine

## 2020-09-23 ENCOUNTER — Encounter: Payer: Self-pay | Admitting: Oncology

## 2020-09-23 DIAGNOSIS — Z1231 Encounter for screening mammogram for malignant neoplasm of breast: Secondary | ICD-10-CM

## 2020-09-26 ENCOUNTER — Other Ambulatory Visit: Payer: Self-pay | Admitting: Family

## 2020-09-30 ENCOUNTER — Telehealth: Payer: Self-pay | Admitting: Internal Medicine

## 2020-09-30 NOTE — Telephone Encounter (Signed)
Patient calling in and states she is returning a call. Did not see anything in her chart.   Please advise

## 2020-09-30 NOTE — Progress Notes (Signed)
LMTCB. Need to let pt know that she is due for a cologuard screening and see if its okay to go ahead and order.

## 2020-11-28 ENCOUNTER — Other Ambulatory Visit: Payer: Self-pay | Admitting: Internal Medicine

## 2021-01-25 LAB — HM DIABETES EYE EXAM

## 2021-02-05 ENCOUNTER — Other Ambulatory Visit: Payer: Self-pay | Admitting: Internal Medicine

## 2021-03-09 ENCOUNTER — Other Ambulatory Visit (INDEPENDENT_AMBULATORY_CARE_PROVIDER_SITE_OTHER): Payer: Medicare Other

## 2021-03-09 ENCOUNTER — Other Ambulatory Visit: Payer: Self-pay

## 2021-03-09 DIAGNOSIS — E1149 Type 2 diabetes mellitus with other diabetic neurological complication: Secondary | ICD-10-CM

## 2021-03-09 DIAGNOSIS — E538 Deficiency of other specified B group vitamins: Secondary | ICD-10-CM

## 2021-03-09 DIAGNOSIS — E1169 Type 2 diabetes mellitus with other specified complication: Secondary | ICD-10-CM | POA: Diagnosis not present

## 2021-03-09 DIAGNOSIS — E785 Hyperlipidemia, unspecified: Secondary | ICD-10-CM | POA: Diagnosis not present

## 2021-03-09 LAB — LIPID PANEL
Cholesterol: 199 mg/dL (ref 0–200)
HDL: 42.2 mg/dL (ref >39.00)
LDL Cholesterol: 128 mg/dL — ABNORMAL HIGH (ref 0–99)
NonHDL: 156.84
Total CHOL/HDL Ratio: 5
Triglycerides: 146 mg/dL (ref 0.0–149.0)
VLDL: 29.2 mg/dL (ref 0.0–40.0)

## 2021-03-09 LAB — COMPREHENSIVE METABOLIC PANEL
ALT: 32 U/L (ref 0–35)
AST: 27 U/L (ref 0–37)
Albumin: 4 g/dL (ref 3.5–5.2)
Alkaline Phosphatase: 73 U/L (ref 39–117)
BUN: 12 mg/dL (ref 6–23)
CO2: 28 mEq/L (ref 19–32)
Calcium: 9.2 mg/dL (ref 8.4–10.5)
Chloride: 103 mEq/L (ref 96–112)
Creatinine, Ser: 0.8 mg/dL (ref 0.40–1.20)
GFR: 71.55 mL/min (ref >60.00)
Glucose, Bld: 120 mg/dL — ABNORMAL HIGH (ref 70–99)
Potassium: 4.2 mEq/L (ref 3.5–5.1)
Sodium: 138 mEq/L (ref 135–145)
Total Bilirubin: 0.9 mg/dL (ref 0.2–1.2)
Total Protein: 6.8 g/dL (ref 6.0–8.3)

## 2021-03-09 LAB — VITAMIN B12: Vitamin B-12: 262 pg/mL (ref 211–911)

## 2021-03-09 LAB — HEMOGLOBIN A1C: Hgb A1c MFr Bld: 6.8 % — ABNORMAL HIGH (ref 4.6–6.5)

## 2021-03-09 LAB — LDL CHOLESTEROL, DIRECT: Direct LDL: 133 mg/dL

## 2021-03-16 ENCOUNTER — Other Ambulatory Visit: Payer: Self-pay

## 2021-03-16 ENCOUNTER — Ambulatory Visit (INDEPENDENT_AMBULATORY_CARE_PROVIDER_SITE_OTHER): Payer: Medicare Other | Admitting: Internal Medicine

## 2021-03-16 VITALS — BP 138/80 | HR 66 | Temp 97.6°F | Resp 16 | Ht 66.0 in | Wt 221.0 lb

## 2021-03-16 DIAGNOSIS — R3911 Hesitancy of micturition: Secondary | ICD-10-CM | POA: Insufficient documentation

## 2021-03-16 DIAGNOSIS — J4520 Mild intermittent asthma, uncomplicated: Secondary | ICD-10-CM | POA: Diagnosis not present

## 2021-03-16 DIAGNOSIS — M791 Myalgia, unspecified site: Secondary | ICD-10-CM

## 2021-03-16 DIAGNOSIS — E1169 Type 2 diabetes mellitus with other specified complication: Secondary | ICD-10-CM | POA: Diagnosis not present

## 2021-03-16 DIAGNOSIS — Z8709 Personal history of other diseases of the respiratory system: Secondary | ICD-10-CM

## 2021-03-16 DIAGNOSIS — E1149 Type 2 diabetes mellitus with other diabetic neurological complication: Secondary | ICD-10-CM

## 2021-03-16 DIAGNOSIS — I700 Atherosclerosis of aorta: Secondary | ICD-10-CM

## 2021-03-16 DIAGNOSIS — K760 Fatty (change of) liver, not elsewhere classified: Secondary | ICD-10-CM

## 2021-03-16 DIAGNOSIS — T466X5A Adverse effect of antihyperlipidemic and antiarteriosclerotic drugs, initial encounter: Secondary | ICD-10-CM

## 2021-03-16 DIAGNOSIS — E785 Hyperlipidemia, unspecified: Secondary | ICD-10-CM

## 2021-03-16 LAB — URINALYSIS, ROUTINE W REFLEX MICROSCOPIC
Bilirubin Urine: NEGATIVE
Ketones, ur: NEGATIVE
Leukocytes,Ua: NEGATIVE
Nitrite: POSITIVE — AB
Specific Gravity, Urine: >=1.03 — AB (ref 1.000–1.030)
Total Protein, Urine: NEGATIVE
Urine Glucose: NEGATIVE
Urobilinogen, UA: 0.2 (ref 0.0–1.0)
pH: 5.5 (ref 5.0–8.0)

## 2021-03-16 NOTE — Assessment & Plan Note (Signed)
Noted on 2019 CT abdoen and reviewed with patient today.  Unfortunately she is statin intolerant;  she declines alternative therapy due to  generic zetia cost  ?Lab Results  ?Component Value Date  ? CHOL 199 03/09/2021  ? HDL 42.20 03/09/2021  ? LDLCALC 128 (H) 03/09/2021  ? LDLDIRECT 133.0 03/09/2021  ? TRIG 146.0 03/09/2021  ? CHOLHDL 5 03/09/2021  ? ? ?

## 2021-03-16 NOTE — Patient Instructions (Signed)
Your diabetes is under excellent control currently.  Please plan to return in 6   months for follow up on diabetes.   Fasting labs need to be done a day or two prior to visit so we can discuss them at your visit.  ? ?I have made the referral to Dr Prescott Parma at [facility] ? ?The zyrtec may be causing your bladder problems, but we will check your urine today for infection ? ? ? ?

## 2021-03-16 NOTE — Assessment & Plan Note (Signed)
Diagnosed by her allergist many years ago  PFTS unknown.  She is requesting referral to pulmnologist Dr Jonnie Kind in [place] ?

## 2021-03-16 NOTE — Assessment & Plan Note (Signed)
Untreated due to statin intolerance.diabetes remains under excellent control . Up to date on eye exams and taking ARB. Encouraged to increase activity level.  ? ?Lab Results  ?Component Value Date  ? HGBA1C 6.8 (H) 03/09/2021  ? ?Lab Results  ?Component Value Date  ? MICROALBUR <0.7 02/17/2020  ? MICROALBUR 1.6 03/19/2017  ? ? ? ? ?

## 2021-03-16 NOTE — Assessment & Plan Note (Signed)
She developed muscle pain with trial of once a week atorvastatin  ?

## 2021-03-16 NOTE — Assessment & Plan Note (Signed)
Advised to suspend zyrtec for a few days as a trial.  Ruling out UTI ?

## 2021-03-16 NOTE — Assessment & Plan Note (Signed)
T BILI and  hepatic enzymes are normal.  She has been unable to lose weight due to orthopedic issues. Continue  Metformin  ? ?Lab Results  ?Component Value Date  ? ALT 32 03/09/2021  ? AST 27 03/09/2021  ? ALKPHOS 73 03/09/2021  ? BILITOT 0.9 03/09/2021  ? ? ?

## 2021-03-16 NOTE — Progress Notes (Signed)
Subjective:  Patient ID: [name], female    DOB: 12-May-1945  Age: 76 y.o. MRN: [id number]  CC: The primary encounter diagnosis was Type 2 diabetes mellitus with neurological complications (HCC). Diagnoses of Hyperlipidemia associated with type 2 diabetes mellitus (HCC), Myalgia due to statin, Mild intermittent asthma, unspecified whether complicated, Urinary hesitancy, Fatty liver, Abdominal aortic atherosclerosis (HCC), and History of asthma were also pertinent to this visit.   This visit occurred during the SARS-CoV-2 public health emergency.  Safety protocols were in place, including screening questions prior to the visit, additional usage of staff PPE, and extensive cleaning of exam room while observing appropriate contact time as indicated for disinfecting solutions.    HPI CAYLEIGH PAULL presents for  Chief Complaint  Patient presents with   Follow-up    Follow up diabetes   1)T2DM:  She  feels generally well,  But is not  exercising regularly or trying to lose weight. Checking  blood sugars less than once daily at variable times, usually only if she feels she may be having a hypoglycemic event. .  BS have been under 130 fasting and < 150 post prandially.  Denies any recent hypoglyemic events.  Taking   medications as directed. Following a carbohydrate modified diet 6 days per week. Denies numbness, burning and tingling of extremities. Appetite is good.    A1c 6.8  no longer taking statin even once weekly .  Nlast eye exam by Alvester Morin in Jan,.  Referred to Dr Darel Hong in [facility] for cataract surgery, but no retinopathy .   2) Hypertension: white coat.  patient checks blood pressure twice weekly at home.  Readings have been for the most part < 130/80 at rest . Patient is following a reduce salt diet most days and is taking medications as prescribed   3) OSA:  4) "there's a hollow place in my lungs when I breathe "  told she had asthma by allergist Dr [place] Callas . Doesn't recall when PFTS were done   " along time ago,"   No longer getting allergy shots every 2 weeks.  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  Denies short of breath,  dry cough ,, but does not exercise or walk  because of multiple chronic and new orthopedic issues (pulled hamstring, plantar fasciitis, both improved, but now having lateral foot pain that starts at the 5th MT on the  right foot,  and now having sciatica pain  5) OA:  takes diclofenac every other day.   Outpatient Medications Prior to Visit  Medication Sig Dispense Refill   albuterol (VENTOLIN HFA) 108 (90 Base) MCG/ACT inhaler Inhale 2 puffs into the lungs every 6 (six) hours as needed for wheezing or shortness of breath.     ALPRAZolam (XANAX) 0.25 MG tablet Take 1 tablet (0.25 mg total) by mouth 2 (two) times daily as needed for anxiety. 30 tablet 0   b complex vitamins tablet Take 1 tablet by mouth daily.     budesonide (PULMICORT) 180 MCG/ACT inhaler Inhale 2 puffs into the lungs 2 (two) times daily.     cetirizine (ZYRTEC) 10 MG tablet Take 10 mg by mouth daily.     Diclofenac Sodium CR 100 MG 24 hr tablet TAKE 1 TABLET BY MOUTH EVERY DAY 90  tablet 1   EPINEPHrine 0.3 mg/0.3 mL IJ SOAJ injection See admin instructions.     latanoprost (XALATAN) 0.005 % ophthalmic solution 1 drop at bedtime.     metFORMIN (GLUCOPHAGE-XR) 500 MG 24 hr tablet TAKE 1 TABLET (500 MG TOTAL) BY MOUTH DAILY AFTER SUPPER. 90 tablet 1   MILK THISTLE PO Take 1 capsule by mouth daily.      ondansetron (ZOFRAN) 4 MG tablet Take 1 tablet (4 mg total) by mouth every 8 (eight) hours as needed for nausea or vomiting. 30 tablet 0   Probiotic Product (PROBIOTIC COMPLEX ACIDOPHILUS PO) Take 1 capsule by mouth daily.     sodium chloride (OCEAN) 0.65 % SOLN nasal spray Place 1 spray into the nose as needed.      telmisartan (MICARDIS) 20 MG tablet TAKE 1 TABLET BY MOUTH EVERY DAY 90 tablet 3   tildrakizumab-asmn (ILUMYA) 100 MG/ML subcutaneous injection Inject 100 mg into the skin every 3 (three) months.     atorvastatin (LIPITOR) 20 MG tablet Take 1 tablet (20 mg total) by mouth once a week. (Patient not taking: Reported on 09/16/2020) 12 tablet 3   bimatoprost (LUMIGAN) 0.03 % ophthalmic solution Place 1 drop into both eyes at bedtime.     No facility-administered medications prior to visit.    Review of Systems;  Patient denies headache, fevers, malaise, unintentional weight loss, skin rash, eye pain, sinus congestion and sinus pain, sore throat, dysphagia,  hemoptysis , cough, dyspnea, wheezing, chest pain, palpitations, orthopnea, edema, abdominal pain, nausea, melena, diarrhea, constipation, flank pain, dysuria, hematuria, urinary  Frequency, nocturia, numbness, tingling, seizures,  Focal weakness, Loss of consciousness,  Tremor, insomnia, depression, anxiety, and suicidal ideation.      Objective:  BP 138/80    Pulse 66    Temp 97.6 F (36.4 C)    Resp 16    Ht 5\' 6"  (1.676 m)    Wt 221 lb (100.2 kg)    SpO2 98%    BMI 35.67 kg/m   BP Readings from Last 3 Encounters:  03/16/21 138/80  09/16/20 (!) 130/98  06/16/20 (!) 154/84    Wt Readings from Last 3 Encounters:  03/16/21 221 lb (100.2 kg)  09/16/20 222 lb 3.2 oz (100.8 kg)  06/16/20 231 lb 3.2 oz (104.9 kg)    General appearance: alert, cooperative and appears stated age Ears: normal TM's and external ear canals both ears Throat: lips, mucosa, and tongue normal; teeth and gums normal Neck: no adenopathy, no carotid bruit, supple, symmetrical, trachea midline and thyroid not enlarged, symmetric, no tenderness/mass/nodules Back: symmetric, no curvature. ROM normal. No CVA tenderness. Lungs: clear to auscultation bilaterally Heart: regular rate and rhythm, S1, S2 normal, no murmur, click, rub or gallop Abdomen: soft,  non-tender; bowel sounds normal; no masses,  no organomegaly Pulses: 2+ and symmetric Skin: Skin color, texture, turgor normal. No rashes or lesions Lymph nodes: Cervical, supraclavicular, and axillary nodes normal.  Lab Results  Component Value Date   HGBA1C 6.8 (H) 03/09/2021   HGBA1C 6.6 (H) 09/13/2020   HGBA1C 7.2 (H) 06/09/2020    Lab Results  Component Value Date   CREATININE 0.80 03/09/2021   CREATININE 0.91 09/13/2020   CREATININE 0.86 06/09/2020    Lab Results  Component Value Date   WBC 11.2 (H) 05/26/2019   HGB 13.9 05/26/2019   HCT 41.1 05/26/2019   PLT 332.0 05/26/2019   GLUCOSE 120 (H) 03/09/2021   CHOL 199 03/09/2021   TRIG 146.0 03/09/2021  HDL 42.20 03/09/2021   LDLDIRECT 133.0 03/09/2021   LDLCALC 128 (H) 03/09/2021   ALT 32 03/09/2021   AST 27 03/09/2021   NA 138 03/09/2021   K 4.2 03/09/2021   CL 103 03/09/2021   CREATININE 0.80 03/09/2021   BUN 12 03/09/2021   CO2 28 03/09/2021   TSH 3.47 06/09/2020   INR 1.1 07/27/2007   HGBA1C 6.8 (H) 03/09/2021   MICROALBUR <0.7 02/17/2020    MM 3D SCREEN BREAST BILATERAL  Result Date: 09/24/2020 CLINICAL DATA:  Screening. EXAM: DIGITAL SCREENING BILATERAL MAMMOGRAM WITH TOMOSYNTHESIS AND CAD TECHNIQUE: Bilateral screening digital craniocaudal and mediolateral oblique mammograms were obtained. Bilateral screening digital breast tomosynthesis was performed. The images were evaluated with computer-aided detection. COMPARISON:  Previous exam(s). ACR Breast Density Category b: There are scattered areas of fibroglandular density. FINDINGS: There are no findings suspicious for malignancy. IMPRESSION: No mammographic evidence of malignancy. A result letter of this screening mammogram will be mailed directly to the patient. RECOMMENDATION: Screening mammogram in one year. (Code:SM-B-01Y) BI-RADS CATEGORY  1: Negative. Electronically Signed   By: Bary Richard M.D.   On: 09/24/2020 13:28    Assessment & Plan:    Problem List Items Addressed This Visit     Abdominal aortic atherosclerosis (HCC)    Noted on 2019 CT abdoen and reviewed with patient today.  Unfortunately she is statin intolerant;  she declines alternative therapy due to  generic zetia cost  Lab Results  Component Value Date   CHOL 199 03/09/2021   HDL 42.20 03/09/2021   LDLCALC 128 (H) 03/09/2021   LDLDIRECT 133.0 03/09/2021   TRIG 146.0 03/09/2021   CHOLHDL 5 03/09/2021         Fatty liver    T BILI and  hepatic enzymes are normal.  She has been unable to lose weight due to orthopedic issues. Continue  Metformin   Lab Results  Component Value Date   ALT 32 03/09/2021   AST 27 03/09/2021   ALKPHOS 73 03/09/2021   BILITOT 0.9 03/09/2021         History of asthma    Diagnosed by her allergist many years ago  PFTS unknown.  She is requesting referral to pulmnologist Dr Jonnie Kind in [facility]      Relevant Orders   Ambulatory referral to Pulmonology   Hyperlipidemia associated with type 2 diabetes mellitus (HCC)    Untreated due to statin intolerance.diabetes remains under excellent control . Up to date on eye exams and taking ARB. Encouraged to increase activity level.   Lab Results  Component Value Date   HGBA1C 6.8 (H) 03/09/2021   Lab Results  Component Value Date   MICROALBUR <0.7 02/17/2020   MICROALBUR 1.6 03/19/2017           Relevant Orders   Lipid panel   Direct LDL   Myalgia due to statin    She developed muscle pain with trial of once a week atorvastatin       Type 2 diabetes mellitus with neurological complications (HCC) - Primary   Relevant Orders   Comprehensive metabolic panel   Hemoglobin A1c   Microalbumin / creatinine urine ratio   Urinary hesitancy    Advised to suspend zyrtec for a few days as a trial.  Ruling out UTI      Relevant Orders   Urinalysis, Routine w reflex microscopic   Urine Culture   Other Visit Diagnoses     Mild intermittent asthma, unspecified  whether complicated  I spent 30 minutes dedicated to the care of this patient on the date of this encounter to include pre-visit review of patient's medical history,  most recent imaging studies, Face-to-face time with the patient , and post visit ordering of testing and therapeutics.    Follow-up: No follow-ups on file.   Crecencio Mc, MD

## 2021-03-18 ENCOUNTER — Other Ambulatory Visit: Payer: Self-pay | Admitting: Internal Medicine

## 2021-03-18 LAB — URINE CULTURE
MICRO NUMBER:: 13074000
SPECIMEN QUALITY:: ADEQUATE

## 2021-03-18 MED ORDER — CIPROFLOXACIN HCL 250 MG PO TABS: 250.0000 mg | ORAL_TABLET | Freq: Two times a day (BID) | ORAL | 0 refills | Status: AC

## 2021-05-24 ENCOUNTER — Other Ambulatory Visit: Payer: Self-pay | Admitting: Internal Medicine

## 2021-08-15 ENCOUNTER — Other Ambulatory Visit: Payer: Self-pay | Admitting: Internal Medicine

## 2021-08-15 DIAGNOSIS — Z1231 Encounter for screening mammogram for malignant neoplasm of breast: Secondary | ICD-10-CM

## 2021-09-12 ENCOUNTER — Other Ambulatory Visit: Payer: Self-pay | Admitting: Family

## 2021-09-16 ENCOUNTER — Other Ambulatory Visit (INDEPENDENT_AMBULATORY_CARE_PROVIDER_SITE_OTHER): Payer: Medicare Other

## 2021-09-16 DIAGNOSIS — E785 Hyperlipidemia, unspecified: Secondary | ICD-10-CM

## 2021-09-16 DIAGNOSIS — E1169 Type 2 diabetes mellitus with other specified complication: Secondary | ICD-10-CM

## 2021-09-16 DIAGNOSIS — E1149 Type 2 diabetes mellitus with other diabetic neurological complication: Secondary | ICD-10-CM | POA: Diagnosis not present

## 2021-09-16 LAB — COMPREHENSIVE METABOLIC PANEL
ALT: 35 U/L (ref 0–35)
AST: 32 U/L (ref 0–37)
Albumin: 3.9 g/dL (ref 3.5–5.2)
Alkaline Phosphatase: 70 U/L (ref 39–117)
BUN: 13 mg/dL (ref 6–23)
CO2: 27 mEq/L (ref 19–32)
Calcium: 8.9 mg/dL (ref 8.4–10.5)
Chloride: 102 mEq/L (ref 96–112)
Creatinine, Ser: 0.87 mg/dL (ref 0.40–1.20)
GFR: 64.46 mL/min (ref >60.00)
Glucose, Bld: 124 mg/dL — ABNORMAL HIGH (ref 70–99)
Potassium: 4.1 mEq/L (ref 3.5–5.1)
Sodium: 138 mEq/L (ref 135–145)
Total Bilirubin: 1.6 mg/dL — ABNORMAL HIGH (ref 0.2–1.2)
Total Protein: 7.1 g/dL (ref 6.0–8.3)

## 2021-09-16 LAB — HEMOGLOBIN A1C: Hgb A1c MFr Bld: 7 % — ABNORMAL HIGH (ref 4.6–6.5)

## 2021-09-16 LAB — LIPID PANEL
Cholesterol: 207 mg/dL — ABNORMAL HIGH (ref 0–200)
HDL: 44.1 mg/dL (ref >39.00)
LDL Cholesterol: 123 mg/dL — ABNORMAL HIGH (ref 0–99)
NonHDL: 163.12
Total CHOL/HDL Ratio: 5
Triglycerides: 199 mg/dL — ABNORMAL HIGH (ref 0.0–149.0)
VLDL: 39.8 mg/dL (ref 0.0–40.0)

## 2021-09-16 LAB — MICROALBUMIN / CREATININE URINE RATIO
Creatinine,U: 131.8 mg/dL
Microalb Creat Ratio: 0.8 mg/g (ref 0.0–30.0)
Microalb, Ur: 1.1 mg/dL (ref 0.0–1.9)

## 2021-09-16 LAB — LDL CHOLESTEROL, DIRECT: Direct LDL: 140 mg/dL

## 2021-09-21 ENCOUNTER — Ambulatory Visit (INDEPENDENT_AMBULATORY_CARE_PROVIDER_SITE_OTHER): Payer: Medicare Other | Admitting: Internal Medicine

## 2021-09-21 ENCOUNTER — Encounter: Payer: Self-pay | Admitting: Internal Medicine

## 2021-09-21 ENCOUNTER — Other Ambulatory Visit: Payer: Self-pay | Admitting: Internal Medicine

## 2021-09-21 VITALS — BP 126/77 | HR 84 | Temp 97.8°F | Ht 66.0 in | Wt 222.0 lb

## 2021-09-21 DIAGNOSIS — E1169 Type 2 diabetes mellitus with other specified complication: Secondary | ICD-10-CM

## 2021-09-21 DIAGNOSIS — E1149 Type 2 diabetes mellitus with other diabetic neurological complication: Secondary | ICD-10-CM | POA: Diagnosis not present

## 2021-09-21 DIAGNOSIS — K760 Fatty (change of) liver, not elsewhere classified: Secondary | ICD-10-CM

## 2021-09-21 DIAGNOSIS — R1011 Right upper quadrant pain: Secondary | ICD-10-CM | POA: Diagnosis not present

## 2021-09-21 DIAGNOSIS — Z9989 Dependence on other enabling machines and devices: Secondary | ICD-10-CM

## 2021-09-21 DIAGNOSIS — G4733 Obstructive sleep apnea (adult) (pediatric): Secondary | ICD-10-CM

## 2021-09-21 DIAGNOSIS — M722 Plantar fascial fibromatosis: Secondary | ICD-10-CM

## 2021-09-21 DIAGNOSIS — E785 Hyperlipidemia, unspecified: Secondary | ICD-10-CM

## 2021-09-21 DIAGNOSIS — E2839 Other primary ovarian failure: Secondary | ICD-10-CM | POA: Diagnosis not present

## 2021-09-21 DIAGNOSIS — Z780 Asymptomatic menopausal state: Secondary | ICD-10-CM

## 2021-09-21 MED ORDER — ONDANSETRON HCL 4 MG PO TABS: 4.0000 mg | ORAL_TABLET | Freq: Three times a day (TID) | ORAL | Status: DC | PRN

## 2021-09-21 MED ORDER — MELOXICAM 15 MG PO TABS: 15.0000 mg | ORAL_TABLET | ORAL | 2 refills | Status: DC | PRN

## 2021-09-21 MED ORDER — TETANUS-DIPHTH-ACELL PERTUSSIS 5-2.5-18.5 LF-MCG/0.5 IM SUSY: 0.5000 mL | PREFILLED_SYRINGE | Freq: Once | INTRAMUSCULAR | 0 refills | Status: AC

## 2021-09-21 NOTE — Patient Instructions (Addendum)
Check sugar 2 hours after a meal and send me those readings.   Your bone density test  been ordered.  Please call to make your appointment at [place]  [phone]    Meloxicam ok to take every 3 days for your arthritis pain   Zofran refilled  Abdominal ultrasound ordered to evaluate your gallbladder

## 2021-09-21 NOTE — Progress Notes (Signed)
Subjective:  Patient ID: [name], female    DOB: 06-19-1945  Age: 76 y.o. MRN: [id number]  CC: The primary encounter diagnosis was Hyperlipidemia associated with type 2 diabetes mellitus (HCC). Diagnoses of Type 2 diabetes mellitus with neurological complications (HCC), Postprandial RUQ pain, Estrogen deficiency, Fatty liver, Morbid obesity (HCC), OSA on CPAP, and Plantar fasciitis were also pertinent to this visit.   HPI [name] presents for follow up on multiple chronic issues  Chief Complaint  Patient presents with   Follow-up    6 month follow up     1) TYPE 2 DM  OBESITY  AND HYPERTENSION:    taking meds as directed.  Not exercising due to chronic  foot pain.  She has recorded 3 fasting sugars checked this month  140, 135 and 111 ,  the 140 occurred after eating pound cake the night before,    Home BP readings  have not been checked.    2) right foot pain: diagnosed with plantar fasciitis by Dr Al Corpus.  Last visit was March 2021.  Meloxicam was helping at the time but "making my heart crazy"  .  Followed by a pulled hamstring, now with osteoarthritis .  Stopped diclofenac .  Thinks the meloxicam helped more than the diclofenac   3) positive depression screen:  95% attributed to right foot pain . "Hurts to even put a shoe on"  4) recurrent nausea caused by eating certain foods (fried food  causes RUQ pain and nausea )   5) OSA:  Diagnosed by sleep study. She is wearing her CPAP every night a minimum of 6 hours per night and notes improved daytime wakefulness and decreased fatigue      Outpatient Medications Prior to Visit  Medication Sig Dispense Refill   albuterol (VENTOLIN HFA) 108 (90 Base) MCG/ACT inhaler Inhale 2 puffs into the lungs every 6 (six) hours as needed for wheezing or shortness of breath.     ALPRAZolam (XANAX) 0.25 MG tablet Take 1 tablet (0.25 mg total) by mouth 2 (two) times daily as needed for anxiety. 30 tablet 0   budesonide (PULMICORT) 180 MCG/ACT  inhaler Inhale 2 puffs into the lungs 2 (two) times daily.     cetirizine (ZYRTEC) 10 MG tablet Take 10 mg by mouth daily.     Diclofenac Sodium CR 100 MG 24 hr tablet TAKE 1 TABLET BY MOUTH EVERY DAY 90 tablet 1   EPINEPHrine 0.3 mg/0.3 mL IJ SOAJ injection See admin instructions.     latanoprost (XALATAN) 0.005 % ophthalmic solution 1 drop at bedtime.     metFORMIN (GLUCOPHAGE-XR) 500 MG 24 hr tablet TAKE 1 TABLET (500 MG TOTAL) BY MOUTH DAILY AFTER SUPPER. 90 tablet 1   MILK THISTLE PO Take 1 capsule by mouth daily.      Probiotic Product (PROBIOTIC COMPLEX ACIDOPHILUS PO) Take 1 capsule by mouth daily.     sodium chloride (OCEAN) 0.65 % SOLN nasal spray Place 1 spray into the nose as needed.     telmisartan (MICARDIS) 20 MG tablet TAKE 1 TABLET BY MOUTH EVERY DAY 90 tablet 3   tildrakizumab-asmn (ILUMYA) 100 MG/ML subcutaneous injection Inject 100 mg into the skin every 3 (three) months.     ondansetron (ZOFRAN) 4 MG tablet Take 1 tablet (4 mg total) by mouth every 8 (eight) hours as needed for nausea or vomiting. 30 tablet 0   b complex vitamins tablet Take 1 tablet by mouth daily. (Patient not taking: Reported  on 09/21/2021)     No facility-administered medications prior to visit.    Review of Systems;  Patient denies headache, fevers, malaise, unintentional weight loss, skin rash, eye pain, sinus congestion and sinus pain, sore throat, dysphagia,  hemoptysis , cough, dyspnea, wheezing, chest pain, palpitations, orthopnea, edema, abdominal pain, nausea, melena, diarrhea, constipation, flank pain, dysuria, hematuria, urinary  Frequency, nocturia, numbness, tingling, seizures,  Focal weakness, Loss of consciousness,  Tremor, insomnia, depression, anxiety, and suicidal ideation.      Objective:  BP 126/77   Pulse 84   Temp 97.8 F (36.6 C) (Oral)   Ht 5\' 6"  (1.676 m)   Wt 222 lb (100.7 kg)   SpO2 97%   BMI 35.83 kg/m   BP Readings from Last 3 Encounters:  09/21/21 126/77   03/16/21 138/80  09/16/20 (!) 130/98    Wt Readings from Last 3 Encounters:  09/21/21 222 lb (100.7 kg)  03/16/21 221 lb (100.2 kg)  09/16/20 222 lb 3.2 oz (100.8 kg)    General appearance: alert, cooperative and appears stated age Ears: normal TM's and external ear canals both ears Throat: lips, mucosa, and tongue normal; teeth and gums normal Neck: no adenopathy, no carotid bruit, supple, symmetrical, trachea midline and thyroid not enlarged, symmetric, no tenderness/mass/nodules Back: symmetric, no curvature. ROM normal. No CVA tenderness. Lungs: clear to auscultation bilaterally Heart: regular rate and rhythm, S1, S2 normal, no murmur, click, rub or gallop Abdomen: soft, non-tender; bowel sounds normal; no masses,  no organomegaly Pulses: 2+ and symmetric Skin: Skin color, texture, turgor normal. No rashes or lesions Lymph nodes: Cervical, supraclavicular, and axillary nodes normal.  Lab Results  Component Value Date   HGBA1C 7.0 (H) 09/16/2021   HGBA1C 6.8 (H) 03/09/2021   HGBA1C 6.6 (H) 09/13/2020    Lab Results  Component Value Date   CREATININE 0.87 09/16/2021   CREATININE 0.80 03/09/2021   CREATININE 0.91 09/13/2020    Lab Results  Component Value Date   WBC 11.2 (H) 05/26/2019   HGB 13.9 05/26/2019   HCT 41.1 05/26/2019   PLT 332.0 05/26/2019   GLUCOSE 124 (H) 09/16/2021   CHOL 207 (H) 09/16/2021   TRIG 199.0 (H) 09/16/2021   HDL 44.10 09/16/2021   LDLDIRECT 140.0 09/16/2021   LDLCALC 123 (H) 09/16/2021   ALT 35 09/16/2021   AST 32 09/16/2021   NA 138 09/16/2021   K 4.1 09/16/2021   CL 102 09/16/2021   CREATININE 0.87 09/16/2021   BUN 13 09/16/2021   CO2 27 09/16/2021   TSH 3.47 06/09/2020   INR 1.1 07/27/2007   HGBA1C 7.0 (H) 09/16/2021   MICROALBUR 1.1 09/16/2021    MM 3D SCREEN BREAST BILATERAL  Result Date: 09/24/2020 CLINICAL DATA:  Screening. EXAM: DIGITAL SCREENING BILATERAL MAMMOGRAM WITH TOMOSYNTHESIS AND CAD TECHNIQUE: Bilateral  screening digital craniocaudal and mediolateral oblique mammograms were obtained. Bilateral screening digital breast tomosynthesis was performed. The images were evaluated with computer-aided detection. COMPARISON:  Previous exam(s). ACR Breast Density Category b: There are scattered areas of fibroglandular density. FINDINGS: There are no findings suspicious for malignancy. IMPRESSION: No mammographic evidence of malignancy. A result letter of this screening mammogram will be mailed directly to the patient. RECOMMENDATION: Screening mammogram in one year. (Code:SM-B-01Y) BI-RADS CATEGORY  1: Negative. Electronically Signed   By: 11/24/2020 M.D.   On: 09/24/2020 13:28    Assessment & Plan:   Problem List Items Addressed This Visit     Fatty liver    T  BILI remains mildly elevated,   hepatic enzymes are normal.  She has been unable to lose weight due to orthopedic issues. Continue  Metformin   Lab Results  Component Value Date   ALT 35 09/16/2021   AST 32 09/16/2021   ALKPHOS 70 09/16/2021   BILITOT 1.6 (H) 09/16/2021         Hyperlipidemia associated with type 2 diabetes mellitus (HCC) - Primary    Untreated due to statin intolerance.  She declines trial of Zetia and PCSK 9 inhibitor, and her .diabetes remains under excellent control . Up to date on eye exams and taking ARB. Encouraged to increase activity level.   Lab Results  Component Value Date   HGBA1C 7.0 (H) 09/16/2021   Lab Results  Component Value Date   MICROALBUR 1.1 09/16/2021   MICROALBUR <0.7 02/17/2020   Lab Results  Component Value Date   CHOL 207 (H) 09/16/2021   HDL 44.10 09/16/2021   LDLCALC 123 (H) 09/16/2021   LDLDIRECT 140.0 09/16/2021   TRIG 199.0 (H) 09/16/2021   CHOLHDL 5 09/16/2021           Relevant Orders   Lipid panel   Direct LDL   Morbid obesity (HCC)    With fatty liver,  Diabetes and hypertension.  She has been unable to lose weight due to chronic foot pain and appears discinclined  to consider water aerobics and alternative exercises        OSA on CPAP    Diagnosed by prior sleep study. Patient is using CPAP every night a minimum of 6 hours per night and notes improved daytime wakefulness and decreased fatigue       Plantar fasciitis    Reviewed use of NSAIDS.  Refilling meloxicam for use every 3 days       Postprandial RUQ pain    LFTs are not suggestive of cholestasis but she may have biliary colic.  RUQ ultrasound ordered , low fat diet recommended       Relevant Orders   US Abdomen Limited RUQ (LIVER/GB)   Type 2 diabetes mellitus with neurological complications (HCC)   Relevant Orders   Hemoglobin A1c   Comprehensive metabolic panel   Other Visit Diagnoses     Estrogen deficiency       Relevant Orders   DG Bone Density       I spent a total of 30 minutes with this patient in a face to face visit on the date of this encounter reviewing the last office visit with me ,  most recent with patient's podiatrist in 2021,  patient's diet and eating habits, home blood pressure readings ,  most recent imaging study ,   and post visit ordering of testing and therapeutics.    Follow-up: Return in about 6 months (around 03/22/2022).   Sherlene Shams, MD

## 2021-09-22 DIAGNOSIS — R1011 Right upper quadrant pain: Secondary | ICD-10-CM | POA: Insufficient documentation

## 2021-09-22 DIAGNOSIS — M722 Plantar fascial fibromatosis: Secondary | ICD-10-CM | POA: Insufficient documentation

## 2021-09-22 NOTE — Assessment & Plan Note (Signed)
T BILI remains mildly elevated,   hepatic enzymes are normal.  She has been unable to lose weight due to orthopedic issues. Continue  Metformin   Lab Results  Component Value Date   ALT 35 09/16/2021   AST 32 09/16/2021   ALKPHOS 70 09/16/2021   BILITOT 1.6 (H) 09/16/2021

## 2021-09-22 NOTE — Assessment & Plan Note (Signed)
With fatty liver,  Diabetes and hypertension.  She has been unable to lose weight due to chronic foot pain and appears discinclined to consider water aerobics and alternative exercises

## 2021-09-22 NOTE — Assessment & Plan Note (Signed)
Diagnosed by prior sleep study. Patient is using CPAP every night a minimum of 6 hours per night and notes improved daytime wakefulness and decreased fatigue  

## 2021-09-22 NOTE — Assessment & Plan Note (Signed)
Untreated due to statin intolerance.  She declines trial of Zetia and PCSK 9 inhibitor, and her .diabetes remains under excellent control . Up to date on eye exams and taking ARB. Encouraged to increase activity level.   Lab Results  Component Value Date   HGBA1C 7.0 (H) 09/16/2021   Lab Results  Component Value Date   MICROALBUR 1.1 09/16/2021   MICROALBUR <0.7 02/17/2020   Lab Results  Component Value Date   CHOL 207 (H) 09/16/2021   HDL 44.10 09/16/2021   LDLCALC 123 (H) 09/16/2021   LDLDIRECT 140.0 09/16/2021   TRIG 199.0 (H) 09/16/2021   CHOLHDL 5 09/16/2021

## 2021-09-22 NOTE — Assessment & Plan Note (Signed)
LFTs are not suggestive of cholestasis but she may have biliary colic.  RUQ ultrasound ordered , low fat diet recommended

## 2021-09-22 NOTE — Assessment & Plan Note (Signed)
Reviewed use of NSAIDS.  Refilling meloxicam for use every 3 days

## 2021-09-23 ENCOUNTER — Telehealth: Payer: Self-pay

## 2021-09-23 ENCOUNTER — Ambulatory Visit
Admission: RE | Admit: 2021-09-23 | Discharge: 2021-09-23 | Disposition: A | Discharge status: Home / Self Care | Payer: Medicare Other | Source: Ambulatory Visit | Attending: Internal Medicine | Admitting: Internal Medicine

## 2021-09-23 DIAGNOSIS — I700 Atherosclerosis of aorta: Secondary | ICD-10-CM

## 2021-09-23 DIAGNOSIS — R1011 Right upper quadrant pain: Secondary | ICD-10-CM | POA: Insufficient documentation

## 2021-09-23 MED ORDER — EZETIMIBE 10 MG PO TABS: 10.0000 mg | ORAL_TABLET | Freq: Every day | ORAL | 5 refills | Status: DC

## 2021-09-23 NOTE — Telephone Encounter (Signed)
Patient states she would like to know if she can try taking Zetia for her cholesterol since she is unable to take Lipitor.  Patient states she meant to ask Dr. Duncan Dull this question at her appointment this week and forgot.  *Patient states her preferred pharmacy is [facility].

## 2021-09-23 NOTE — Telephone Encounter (Signed)
Yes, but it may be too $$$ even generic .  [facility] to [facility]

## 2021-09-23 NOTE — Telephone Encounter (Signed)
Spoke to Patient to let her know the prescription was called in.

## 2021-09-23 NOTE — Assessment & Plan Note (Signed)
She has decided to try taking zetia.  rx sent

## 2021-09-26 ENCOUNTER — Ambulatory Visit
Admission: RE | Admit: 2021-09-26 | Discharge: 2021-09-26 | Disposition: A | Discharge status: Home / Self Care | Payer: Medicare Other | Source: Ambulatory Visit | Attending: Internal Medicine | Admitting: Internal Medicine

## 2021-09-26 DIAGNOSIS — Z1231 Encounter for screening mammogram for malignant neoplasm of breast: Secondary | ICD-10-CM

## 2021-11-11 ENCOUNTER — Telehealth: Payer: Self-pay | Admitting: *Deleted

## 2021-11-11 NOTE — Patient Outreach (Signed)
  Care Coordination   Initial Visit Note   11/11/2021 Name: [name] MRN: [id number] DOB: [birth year]  [name] is a 76 y.o. year old female who sees Derrel Nip, Aris Everts, MD for primary care. I spoke with  Sue Lush by phone today.  What matters to the patients health and wellness today?  No health concerns voiced. RN discussed Callahan, RN, SW, and Pharmacist. Patient declined services.     Goals Addressed             This Visit's Progress    RN discussed following up with AWV and Vaccines          SDOH assessments and interventions completed:  Yes     Care Coordination Interventions Activated:  Yes  Care Coordination Interventions:  Yes, provided/Care coordination program/ services discussed  Social determinants of health survey completed Annual wellness visit discussed and patient advised to contact provider office to schedule Patient advised to contact primary care provider office  if care coordination services needed in the future    Follow up plan: No further intervention required.   Encounter Outcome:  Pt. Visit Completed   [employer] [phone]

## 2021-11-17 ENCOUNTER — Other Ambulatory Visit: Payer: Self-pay | Admitting: Internal Medicine

## 2021-11-30 ENCOUNTER — Ambulatory Visit (INDEPENDENT_AMBULATORY_CARE_PROVIDER_SITE_OTHER): Payer: Medicare Other | Admitting: Family Medicine

## 2021-11-30 ENCOUNTER — Other Ambulatory Visit (INDEPENDENT_AMBULATORY_CARE_PROVIDER_SITE_OTHER): Payer: Medicare Other

## 2021-11-30 ENCOUNTER — Encounter: Payer: Self-pay | Admitting: Family Medicine

## 2021-11-30 VITALS — BP 134/82 | HR 87 | Temp 98.1°F | Ht 66.0 in | Wt 219.6 lb

## 2021-11-30 DIAGNOSIS — R8290 Unspecified abnormal findings in urine: Secondary | ICD-10-CM

## 2021-11-30 DIAGNOSIS — J0110 Acute frontal sinusitis, unspecified: Secondary | ICD-10-CM | POA: Diagnosis not present

## 2021-11-30 DIAGNOSIS — N3000 Acute cystitis without hematuria: Secondary | ICD-10-CM

## 2021-11-30 DIAGNOSIS — R350 Frequency of micturition: Secondary | ICD-10-CM

## 2021-11-30 DIAGNOSIS — I10 Essential (primary) hypertension: Secondary | ICD-10-CM

## 2021-11-30 LAB — URINALYSIS, ROUTINE W REFLEX MICROSCOPIC
Bilirubin Urine: NEGATIVE
Hgb urine dipstick: NEGATIVE
Ketones, ur: NEGATIVE
Nitrite: POSITIVE — AB
Specific Gravity, Urine: 1.025 (ref 1.000–1.030)
Total Protein, Urine: NEGATIVE
Urine Glucose: NEGATIVE
Urobilinogen, UA: 0.2 (ref 0.0–1.0)
pH: 5.5 (ref 5.0–8.0)

## 2021-11-30 LAB — POCT URINALYSIS DIPSTICK OB
Glucose, UA: NEGATIVE
Ketones, UA: NEGATIVE
Spec Grav, UA: 1.02 (ref 1.010–1.025)
Urobilinogen, UA: NEGATIVE E.U./dL — AB
pH, UA: 5.5 (ref 5.0–8.0)

## 2021-11-30 MED ORDER — BENZONATATE 100 MG PO CAPS: 200.0000 mg | ORAL_CAPSULE | Freq: Two times a day (BID) | ORAL | 0 refills | Status: DC | PRN

## 2021-11-30 MED ORDER — AMOXICILLIN 875 MG PO TABS: 875.0000 mg | ORAL_TABLET | Freq: Two times a day (BID) | ORAL | 0 refills | Status: DC

## 2021-11-30 NOTE — Patient Instructions (Addendum)
It was a pleasure meeting you today. Thank you for allowing me to take part in your health care.  Our goals for today as we discussed include:  You have a sinus infection and a urinary tract infection. I have sent in medication that will cover both infections. Take Augmentin 1 tablet twice a day for 5 days Take a probiotic 1 tablet daily while on antibiotics and for at least 2 weeks after discontinuation of. Use Flonase 1 spray to each nostril twice daily or you can use 2 sprays once a day Continue Allegra 180 mg daily Continue to stay hydrated, water and cranberry juice  I sent your urine off for further evaluation.  We may need to change her antibiotics pending the results of the urine culture.    If you have worsening symptoms please notify MD.  If any difficulty breathing, shortness of breath please call 911 or have someone to go to the emergency department.   Please follow-up with PCP as scheduled or sooner if needed  If you have any questions or concerns, please do not hesitate to call the office at [phone].  I look forward to our next visit and until then take care and stay safe.  Regards,   Dana Allan, MD   [facility]   Sinus Infection, Adult A sinus infection is soreness and swelling (inflammation) of your sinuses. Sinuses are hollow spaces in the bones around your face. They are located: Around your eyes. In the middle of your forehead. Behind your nose. In your cheekbones. Your sinuses and nasal passages are lined with a fluid called mucus. Mucus drains out of your sinuses. Swelling can trap mucus in your sinuses. This lets germs (bacteria, virus, or fungus) grow, which leads to infection. Most of the time, this condition is caused by a virus. What are the causes? Allergies. Asthma. Germs. Things that block your nose or sinuses. Growths in the nose (nasal polyps). Chemicals or irritants in the air. A fungus. This is rare. What increases  the risk? Having a weak body defense system (immune system). Doing a lot of swimming or diving. Using nasal sprays too much. Smoking. What are the signs or symptoms? The main symptoms of this condition are pain and a feeling of pressure around the sinuses. Other symptoms include: Stuffy nose (congestion). This may make it hard to breathe through your nose. Runny nose (drainage). Soreness, swelling, and warmth in the sinuses. A cough that may get worse at night. Being unable to smell and taste. Mucus that collects in the throat or the back of the nose (postnasal drip). This may cause a sore throat or bad breath. Being very tired (fatigued). A fever. How is this diagnosed? Your symptoms. Your medical history. A physical exam. Tests to find out if your condition is short-term (acute) or long-term (chronic). Your doctor may: Check your nose for growths (polyps). Check your sinuses using a tool that has a light on one end (endoscope). Check for allergies or germs. Do imaging tests, such as an MRI or CT scan. How is this treated? Treatment for this condition depends on the cause and whether it is short-term or long-term. If caused by a virus, your symptoms should go away on their own within 10 days. You may be given medicines to relieve symptoms. They include: Medicines that shrink swollen tissue in the nose. A spray that treats swelling of the nostrils. Rinses that help get rid of thick mucus in your nose (nasal saline washes).  Medicines that treat allergies (antihistamines). Over-the-counter pain relievers. If caused by bacteria, your doctor may wait to see if you will get better without treatment. You may be given antibiotic medicine if you have: A very bad infection. A weak body defense system. If caused by growths in the nose, surgery may be needed. Follow these instructions at home: Medicines Take, use, or apply over-the-counter and prescription medicines only as told by your  doctor. These may include nasal sprays. If you were prescribed an antibiotic medicine, take it as told by your doctor. Do not stop taking it even if you start to feel better. Hydrate and humidify  Drink enough water to keep your pee (urine) pale yellow. Use a cool mist humidifier to keep the humidity level in your home above 50%. Breathe in steam for 10-15 minutes, 3-4 times a day, or as told by your doctor. You can do this in the bathroom while a hot shower is running. Try not to spend time in cool or dry air. Rest Rest as much as you can. Sleep with your head raised (elevated). Make sure you get enough sleep each night. General instructions  Put a warm, moist washcloth on your face 3-4 times a day, or as often as told by your doctor. Use nasal saline washes as often as told by your doctor. Wash your hands often with soap and water. If you cannot use soap and water, use hand sanitizer. Do not smoke. Avoid being around people who are smoking (secondhand smoke). Keep all follow-up visits. Contact a doctor if: You have a fever. Your symptoms get worse. Your symptoms do not get better within 10 days. Get help right away if: You have a very bad headache. You cannot stop vomiting. You have very bad pain or swelling around your face or eyes. You have trouble seeing. You feel confused. Your neck is stiff. You have trouble breathing. These symptoms may be an emergency. Get help right away. Call 911. Do not wait to see if the symptoms will go away. Do not drive yourself to the hospital. Summary A sinus infection is swelling of your sinuses. Sinuses are hollow spaces in the bones around your face. This condition is caused by tissues in your nose that become inflamed or swollen. This traps germs. These can lead to infection. If you were prescribed an antibiotic medicine, take it as told by your doctor. Do not stop taking it even if you start to feel better. Keep all follow-up visits. This  information is not intended to replace advice given to you by your health care provider. Make sure you discuss any questions you have with your health care provider. Document Revised: 12/07/2020 Document Reviewed: 12/07/2020 Elsevier Patient Education  2023 [employer] Inc[employer]   Urinary Tract Infection, Adult A urinary tract infection (UTI) is an infection of any part of the urinary tract. The urinary tract includes: The kidneys. The ureters. The bladder. The urethra. These organs make, store, and get rid of pee (urine) in the body. What are the causes? This infection is caused by germs (bacteria) in your genital area. These germs grow and cause swelling (inflammation) of your urinary tract. What increases the risk? The following factors may make you more likely to develop this condition: Using a small, thin tube (catheter) to drain pee. Not being able to control when you pee or poop (incontinence). Being female. If you are female, these things can increase the risk: Using these methods to prevent pregnancy: A medicine that kills  sperm (spermicide). A device that blocks sperm (diaphragm). Having low levels of a female hormone (estrogen). Being pregnant. You are more likely to develop this condition if: You have genes that add to your risk. You are sexually active. You take antibiotic medicines. You have trouble peeing because of: A prostate that is bigger than normal, if you are female. A blockage in the part of your body that drains pee from the bladder. A kidney stone. A nerve condition that affects your bladder. Not getting enough to drink. Not peeing often enough. You have other conditions, such as: Diabetes. A weak disease-fighting system (immune system). Sickle cell disease. Gout. Injury of the spine. What are the signs or symptoms? Symptoms of this condition include: Needing to pee right away. Peeing small amounts often. Pain or burning when peeing. Blood in the  pee. Pee that smells bad or not like normal. Trouble peeing. Pee that is cloudy. Fluid coming from the vagina, if you are female. Pain in the belly or lower back. Other symptoms include: Vomiting. Not feeling hungry. Feeling mixed up (confused). This may be the first symptom in older adults. Being tired and grouchy (irritable). A fever. Watery poop (diarrhea). How is this treated? Taking antibiotic medicine. Taking other medicines. Drinking enough water. In some cases, you may need to see a specialist. Follow these instructions at home:  Medicines Take over-the-counter and prescription medicines only as told by your doctor. If you were prescribed an antibiotic medicine, take it as told by your doctor. Do not stop taking it even if you start to feel better. General instructions Make sure you: Pee until your bladder is empty. Do not hold pee for a long time. Empty your bladder after sex. Wipe from front to back after peeing or pooping if you are a female. Use each tissue one time when you wipe. Drink enough fluid to keep your pee pale yellow. Keep all follow-up visits. Contact a doctor if: You do not get better after 1-2 days. Your symptoms go away and then come back. Get help right away if: You have very bad back pain. You have very bad pain in your lower belly. You have a fever. You have chills. You feeling like you will vomit or you vomit. Summary A urinary tract infection (UTI) is an infection of any part of the urinary tract. This condition is caused by germs in your genital area. There are many risk factors for a UTI. Treatment includes antibiotic medicines. Drink enough fluid to keep your pee pale yellow. This information is not intended to replace advice given to you by your health care provider. Make sure you discuss any questions you have with your health care provider. Document Revised: 08/15/2019 Document Reviewed: 08/15/2019 Elsevier Patient Education  2023  [employer].

## 2021-11-30 NOTE — Progress Notes (Signed)
SUBJECTIVE:   Chief Complaint  Patient presents with   Acute Visit    Congested UTI Ear pain   HPI Patient presents to clinic for concern of congestion and UTI.  Nasal congestion Patient reports headache, sore throat and bodyaches for 2 weeks.  Has had runny nose, nasal congestion and headaches.  Endorses left ear pain, dry cough.  Reports husband's family was visiting from out of state and had similar symptoms.  She has tried Mucinex, Cepacol, Allegra and Ricola which seem to help with the sore throat.  Reports taking Allegra.  Reports symptoms somewhat improved.  Denies any fevers, loss of hearing, ringing and ears, ear discharge, shortness of breath, chest pain, nausea/vomiting or lower extremity edema.  Urinary frequency Symptoms started few days ago.  Feels like not emptying bladder.  Associated with dysuria and foul odor urine.  Denies any hematuria, fevers, abdominal pain or pressure, diarrhea or constipation.   PERTINENT PMH / PSH: Urinary retention/hesitancy Diabetes type 2 OSA on CPAP   OBJECTIVE:  BP 134/82 (BP Location: Left Arm, Patient Position: Sitting, Cuff Size: Normal)   Pulse 87   Temp 98.1 F (36.7 C) (Oral)   Ht 5\' 6"  (1.676 m)   Wt 219 lb 9.6 oz (99.6 kg)   SpO2 98%   BMI 35.44 kg/m    Physical Exam Vitals reviewed.  Constitutional:      General: She is not in acute distress.    Appearance: Normal appearance. She is not ill-appearing, toxic-appearing or diaphoretic.  HENT:     Head: Normocephalic.     Right Ear: Tympanic membrane, ear canal and external ear normal. There is no impacted cerumen.     Left Ear: Tympanic membrane, ear canal and external ear normal. There is no impacted cerumen.     Nose: Nose normal. No congestion or rhinorrhea.     Mouth/Throat:     Mouth: Mucous membranes are moist.     Pharynx: Oropharynx is clear. No oropharyngeal exudate or posterior oropharyngeal erythema.  Eyes:     General:        Right eye: No  discharge.        Left eye: No discharge.     Conjunctiva/sclera: Conjunctivae normal.  Cardiovascular:     Rate and Rhythm: Normal rate and regular rhythm.     Heart sounds: Normal heart sounds.  Pulmonary:     Effort: Pulmonary effort is normal.     Breath sounds: Normal breath sounds.  Abdominal:     General: Bowel sounds are normal.     Palpations: Abdomen is soft.     Tenderness: There is no right CVA tenderness, left CVA tenderness, guarding or rebound.  Musculoskeletal:        General: Normal range of motion.     Cervical back: Normal range of motion.  Lymphadenopathy:     Cervical: No cervical adenopathy.  Skin:    General: Skin is warm and dry.  Neurological:     General: No focal deficit present.     Mental Status: She is alert and oriented to person, place, and time. Mental status is at baseline.  Psychiatric:        Mood and Affect: Mood normal.        Behavior: Behavior normal.        Thought Content: Thought content normal.        Judgment: Judgment normal.     ASSESSMENT/PLAN:  Acute non-recurrent frontal sinusitis Assessment & Plan: Nasal congestion,  headaches ongoing for over 2 weeks Will treat with antibiotics, Mucinex and Tessalon Perles. Probiotics daily and continue for at least 2 weeks after antibiotics completion Follow-up with PCP if no improvement  Orders: -     Benzonatate; Take 2 capsules (200 mg total) by mouth 2 (two) times daily as needed for cough.  Dispense: 20 capsule; Refill: 0 -     Amoxicillin-Pot Clavulanate; Take 1 tablet by mouth 2 (two) times daily for 5 days.  Dispense: 10 tablet; Refill: 0 -     guaiFENesin ER; Take 1 tablet (600 mg total) by mouth 2 (two) times daily for 14 days.  Dispense: 28 tablet; Refill: 0  Acute cystitis without hematuria Assessment & Plan: Acute, symptomatic.  Low suspicion for pyelonephritis given no fevers or CVA tenderness. POC urine positive for nitrates and leukocytes Start Augmentin given will  cover sinusitis as well. Follow-up with culture sensitivities, may need to change antibiotics   Orders: -     Urine Culture -     Urinalysis, Routine w reflex microscopic -     Amoxicillin-Pot Clavulanate; Take 1 tablet by mouth 2 (two) times daily for 5 days.  Dispense: 10 tablet; Refill: 0  White coat syndrome with diagnosis of hypertension Assessment & Plan: Likely secondary to concurrent URI/UTI in addition to whitecoat syndrome.  Reports blood pressures at home usually lower than 130/80.  Currently on telmisartan 20 mg daily.   Continue to monitor blood pressures at home. Follow-up with PCP     PDMP reviewed  Return if symptoms worsen or fail to improve.  Dana Allan, MD

## 2021-12-02 ENCOUNTER — Telehealth: Payer: Self-pay

## 2021-12-02 MED ORDER — AMOXICILLIN-POT CLAVULANATE 875-125 MG PO TABS: 1.0000 | ORAL_TABLET | Freq: Two times a day (BID) | ORAL | 0 refills | Status: AC

## 2021-12-02 MED ORDER — GUAIFENESIN ER 600 MG PO TB12: 600.0000 mg | ORAL_TABLET | Freq: Two times a day (BID) | ORAL | 0 refills | Status: AC

## 2021-12-02 NOTE — Telephone Encounter (Signed)
LMTCB regarding her lab results 

## 2021-12-02 NOTE — Progress Notes (Signed)
Mucinex 600 mg two times a day for cough ordered.  Changed antibiotics to Augmentin two times a day for  5 days.  Stop current antibiotic

## 2021-12-03 LAB — URINE CULTURE
MICRO NUMBER:: 14193072
SPECIMEN QUALITY:: ADEQUATE

## 2021-12-13 ENCOUNTER — Telehealth: Payer: Self-pay | Admitting: Internal Medicine

## 2021-12-13 DIAGNOSIS — R300 Dysuria: Secondary | ICD-10-CM

## 2021-12-13 NOTE — Telephone Encounter (Signed)
Pt called stating she would like another round of augmentin because she stated it has helped her feel better

## 2021-12-14 MED ORDER — CIPROFLOXACIN HCL 250 MG PO TABS: 250.0000 mg | ORAL_TABLET | Freq: Two times a day (BID) | ORAL | 0 refills | Status: AC

## 2021-12-14 NOTE — Telephone Encounter (Signed)
Spoke with pt and she stated that she does not feel like the UTI went away after completing the augmentin. Pt was advised that she would need to drop off a urine sample before another antibiotic could be called in. Pt stated that she will drop the sample off tomorrow.

## 2021-12-14 NOTE — Telephone Encounter (Signed)
Pt is aware and gave a verbal understanding.  

## 2021-12-14 NOTE — Telephone Encounter (Signed)
She was treated b Dr Clent Ridges with augmentin. Which will not work on the Urine bacteria cultured from last sample  I am calling in cipro to take 250 mg twice daily for 3 days,  if symptoms do not resolve, then she can submit another specimen

## 2021-12-15 ENCOUNTER — Other Ambulatory Visit: Payer: Medicare Other

## 2021-12-17 ENCOUNTER — Encounter: Payer: Self-pay | Admitting: Family Medicine

## 2021-12-17 DIAGNOSIS — N3000 Acute cystitis without hematuria: Secondary | ICD-10-CM | POA: Insufficient documentation

## 2021-12-17 DIAGNOSIS — R350 Frequency of micturition: Secondary | ICD-10-CM | POA: Insufficient documentation

## 2021-12-17 DIAGNOSIS — J0110 Acute frontal sinusitis, unspecified: Secondary | ICD-10-CM | POA: Insufficient documentation

## 2021-12-17 NOTE — Assessment & Plan Note (Signed)
Acute, symptomatic.  Low suspicion for pyelonephritis given no fevers or CVA tenderness. POC urine positive for nitrates and leukocytes Start Augmentin given will cover sinusitis as well. Follow-up with culture sensitivities, may need to change antibiotics

## 2021-12-17 NOTE — Assessment & Plan Note (Addendum)
Nasal congestion, headaches ongoing for over 2 weeks Will treat with antibiotics, Mucinex and Tessalon Perles. Probiotics daily and continue for at least 2 weeks after antibiotics completion Follow-up with PCP if no improvement

## 2021-12-17 NOTE — Assessment & Plan Note (Addendum)
Likely secondary to concurrent URI/UTI in addition to whitecoat syndrome.  Reports blood pressures at home usually lower than 130/80.  Currently on telmisartan 20 mg daily.   Continue to monitor blood pressures at home. Follow-up with PCP

## 2022-01-30 ENCOUNTER — Encounter: Payer: Self-pay | Admitting: Oncology

## 2022-02-20 LAB — HM DIABETES EYE EXAM

## 2022-02-22 ENCOUNTER — Encounter: Payer: Self-pay | Admitting: Oncology

## 2022-02-24 ENCOUNTER — Ambulatory Visit: Payer: Medicare Other | Admitting: Nurse Practitioner

## 2022-03-01 ENCOUNTER — Ambulatory Visit
Admission: RE | Admit: 2022-03-01 | Discharge: 2022-03-01 | Disposition: A | Discharge status: Home / Self Care | Payer: Medicare Other | Source: Ambulatory Visit | Attending: Internal Medicine | Admitting: Internal Medicine

## 2022-03-01 DIAGNOSIS — Z780 Asymptomatic menopausal state: Secondary | ICD-10-CM

## 2022-03-14 ENCOUNTER — Other Ambulatory Visit (INDEPENDENT_AMBULATORY_CARE_PROVIDER_SITE_OTHER): Payer: Medicare Other

## 2022-03-14 DIAGNOSIS — E785 Hyperlipidemia, unspecified: Secondary | ICD-10-CM | POA: Diagnosis not present

## 2022-03-14 DIAGNOSIS — E1169 Type 2 diabetes mellitus with other specified complication: Secondary | ICD-10-CM | POA: Diagnosis not present

## 2022-03-14 DIAGNOSIS — R300 Dysuria: Secondary | ICD-10-CM | POA: Diagnosis not present

## 2022-03-14 DIAGNOSIS — E1149 Type 2 diabetes mellitus with other diabetic neurological complication: Secondary | ICD-10-CM

## 2022-03-14 LAB — URINALYSIS, ROUTINE W REFLEX MICROSCOPIC
Bilirubin Urine: NEGATIVE
Ketones, ur: NEGATIVE
Leukocytes,Ua: NEGATIVE
Nitrite: POSITIVE — AB
Specific Gravity, Urine: 1.02 (ref 1.000–1.030)
Total Protein, Urine: NEGATIVE
Urine Glucose: NEGATIVE
Urobilinogen, UA: 0.2 (ref 0.0–1.0)
pH: 6 (ref 5.0–8.0)

## 2022-03-14 LAB — LIPID PANEL
Cholesterol: 183 mg/dL (ref 0–200)
HDL: 50.1 mg/dL (ref >39.00)
LDL Cholesterol: 112 mg/dL — ABNORMAL HIGH (ref 0–99)
NonHDL: 133.32
Total CHOL/HDL Ratio: 4
Triglycerides: 108 mg/dL (ref 0.0–149.0)
VLDL: 21.6 mg/dL (ref 0.0–40.0)

## 2022-03-14 LAB — COMPREHENSIVE METABOLIC PANEL
ALT: 17 U/L (ref 0–35)
AST: 14 U/L (ref 0–37)
Albumin: 3.7 g/dL (ref 3.5–5.2)
Alkaline Phosphatase: 81 U/L (ref 39–117)
BUN: 13 mg/dL (ref 6–23)
CO2: 29 mEq/L (ref 19–32)
Calcium: 9 mg/dL (ref 8.4–10.5)
Chloride: 102 mEq/L (ref 96–112)
Creatinine, Ser: 0.8 mg/dL (ref 0.40–1.20)
GFR: 71.04 mL/min (ref >60.00)
Glucose, Bld: 137 mg/dL — ABNORMAL HIGH (ref 70–99)
Potassium: 4.6 mEq/L (ref 3.5–5.1)
Sodium: 137 mEq/L (ref 135–145)
Total Bilirubin: 1 mg/dL (ref 0.2–1.2)
Total Protein: 6.7 g/dL (ref 6.0–8.3)

## 2022-03-14 LAB — HEMOGLOBIN A1C: Hgb A1c MFr Bld: 7.1 % — ABNORMAL HIGH (ref 4.6–6.5)

## 2022-03-17 ENCOUNTER — Other Ambulatory Visit: Payer: Self-pay | Admitting: Internal Medicine

## 2022-03-17 DIAGNOSIS — B952 Enterococcus as the cause of diseases classified elsewhere: Secondary | ICD-10-CM

## 2022-03-17 LAB — URINE CULTURE
MICRO NUMBER:: 14620559
SPECIMEN QUALITY:: ADEQUATE

## 2022-03-17 MED ORDER — CIPROFLOXACIN HCL 250 MG PO TABS: 250.0000 mg | ORAL_TABLET | Freq: Two times a day (BID) | ORAL | 0 refills | Status: AC

## 2022-03-17 NOTE — Assessment & Plan Note (Signed)
Cipro sent to pharmacy.  Keep follow up appt march 6

## 2022-03-22 ENCOUNTER — Encounter: Payer: Self-pay | Admitting: Internal Medicine

## 2022-03-22 ENCOUNTER — Ambulatory Visit (INDEPENDENT_AMBULATORY_CARE_PROVIDER_SITE_OTHER): Payer: Medicare Other | Admitting: Internal Medicine

## 2022-03-22 VITALS — BP 124/72 | HR 88 | Temp 97.8°F | Resp 16 | Ht 66.0 in | Wt 222.0 lb

## 2022-03-22 DIAGNOSIS — I10 Essential (primary) hypertension: Secondary | ICD-10-CM

## 2022-03-22 DIAGNOSIS — E785 Hyperlipidemia, unspecified: Secondary | ICD-10-CM

## 2022-03-22 DIAGNOSIS — N390 Urinary tract infection, site not specified: Secondary | ICD-10-CM

## 2022-03-22 DIAGNOSIS — E1169 Type 2 diabetes mellitus with other specified complication: Secondary | ICD-10-CM | POA: Diagnosis not present

## 2022-03-22 DIAGNOSIS — G4733 Obstructive sleep apnea (adult) (pediatric): Secondary | ICD-10-CM

## 2022-03-22 DIAGNOSIS — Z8709 Personal history of other diseases of the respiratory system: Secondary | ICD-10-CM

## 2022-03-22 DIAGNOSIS — E1149 Type 2 diabetes mellitus with other diabetic neurological complication: Secondary | ICD-10-CM

## 2022-03-22 DIAGNOSIS — I700 Atherosclerosis of aorta: Secondary | ICD-10-CM

## 2022-03-22 DIAGNOSIS — L400 Psoriasis vulgaris: Secondary | ICD-10-CM | POA: Diagnosis not present

## 2022-03-22 DIAGNOSIS — B952 Enterococcus as the cause of diseases classified elsewhere: Secondary | ICD-10-CM

## 2022-03-22 MED ORDER — FLUCONAZOLE 150 MG PO TABS: 150.0000 mg | ORAL_TABLET | Freq: Every day | ORAL | 0 refills | Status: DC

## 2022-03-22 MED ORDER — EZETIMIBE 10 MG PO TABS: 10.0000 mg | ORAL_TABLET | Freq: Every day | ORAL | 3 refills | Status: DC

## 2022-03-22 MED ORDER — METFORMIN HCL ER 500 MG PO TB24: 500.0000 mg | ORAL_TABLET | Freq: Every day | ORAL | 3 refills | Status: DC

## 2022-03-22 NOTE — Assessment & Plan Note (Signed)
With fatty liver,  Diabetes and hypertension.  She has been unable to lose weight due to chronic foot pain and  remains  discinclined to consider water aerobics and alternative exercises  .  Encouraged to consider Rybelsus

## 2022-03-22 NOTE — Assessment & Plan Note (Signed)
Likely triggered by diarrhea.  Resolved with cipro

## 2022-03-22 NOTE — Assessment & Plan Note (Signed)
Managed with metformin which she would liek to suspend due to continued complaints of joint pain and diarreha.  Discussed alternative of rybelsus as an alternative given her obesity and diarrhea predominant I BS.  She is is apprehensive about potential side effects , but has no contraindications,  encouraged to find a physical activity she can tolerate.  She is also zetia intolerant , (diarrhea)  but is taking an ARB    Lab Results  Component Value Date   HGBA1C 7.1 (H) 03/14/2022   Lab Results  Component Value Date   MICROALBUR 1.1 09/16/2021   MICROALBUR <0.7 02/17/2020

## 2022-03-22 NOTE — Assessment & Plan Note (Signed)
She is requesting a new CPAP machine , her current one is 105-78 yrs old

## 2022-03-22 NOTE — Assessment & Plan Note (Signed)
Managed by Dr Kellie Moor with [facility]

## 2022-03-22 NOTE — Assessment & Plan Note (Addendum)
She has stopped taking zetia, due to intolerance of medication.

## 2022-03-22 NOTE — Patient Instructions (Addendum)
It is time for your Medicare Annual Wellness Visit. Please schedule this at check out.  Your diabetes remains under good control,  but you can stop the metformin to see if symptoms of joint pain and diarrhea improve   Remember that: Fasting sugars should be < 130  and post prandials  should be < 160    Please Consider a trial of Rybelsus if diabetes  medication is needed . It is  a medication that causes your pancreas to increase its  own insulin secretion  And also slows down the emptying of your stomach,  So it decreases your appetite and helps you lose weight. It may improve the consistency of your stools since it slows down your bowels as well

## 2022-03-22 NOTE — Assessment & Plan Note (Signed)
Untreated due to statin intolerance.  She declines trial of Zetia and PCSK 9 inhibitor, and her .diabetes remains under excellent control . Up to date on eye exams and taking ARB. Encouraged to increase activity level.   Lab Results  Component Value Date   HGBA1C 7.1 (H) 03/14/2022   Lab Results  Component Value Date   MICROALBUR 1.1 09/16/2021   MICROALBUR <0.7 02/17/2020   Lab Results  Component Value Date   CHOL 183 03/14/2022   HDL 50.10 03/14/2022   LDLCALC 112 (H) 03/14/2022   LDLDIRECT 140.0 09/16/2021   TRIG 108.0 03/14/2022   CHOLHDL 4 03/14/2022

## 2022-03-22 NOTE — Progress Notes (Signed)
Subjective:  Patient ID: [name], female    DOB: 16-May-1945  Age: 77 y.o. MRN: [id number]  CC: The primary encounter diagnosis was Hyperlipidemia associated with type 2 diabetes mellitus ([place]). Diagnoses of Type 2 diabetes mellitus with neurological complications ([place]), UTI (urinary tract infection) due to Enterococcus, Plaque psoriasis, OSA on CPAP, Morbid obesity ([place]), and White coat syndrome with diagnosis of hypertension were also pertinent to this visit.   HPI [name] presents for  Chief Complaint  Patient presents with   Medical Management of Chronic Issues   1) diabetes, obesity and hypertension:  has cut out most sweets,  except PBJ sandwich  . Taking metformin but attributes her joint pain and diarrhea to the  medication .Weight is unchanged, but her objective is to reduce her medications.  She is unable to exercise due to multiple orthopedic issues:  1)  continued foot pain on the right from plantar fasciitis and 2) the pulled hamstring left leg in 2022 ,still bothering her.  3)  bilateral foot neuropathy,   4) osteoarthritis .  Home bp readings have been < 140/90 . Neuropathy described as "feeling like I have tight socks on "   2)  recurrent UTI  secondary to E Coli.  First one in November . Treated with augmentin changed to cipro.  Most recent one Started after having diarrhea which she attributed to Zetia trial. Symptoms resolved after Cipro  3) HLD:  did not tolerate Zetia  due to increased loose stools.  4)  Psoriasis   taking Ilumyia  5) OSA:  supplies from [employer] ([facility]) needs new machine .    Outpatient Medications Prior to Visit  Medication Sig Dispense Refill   albuterol (VENTOLIN HFA) 108 (90 Base) MCG/ACT inhaler Inhale 2 puffs into the lungs every 6 (six) hours as needed for wheezing or shortness of breath.     ALPRAZolam (XANAX) 0.25 MG tablet Take 1 tablet (0.25 mg total) by mouth 2 (two) times daily as needed for anxiety. 30 tablet 0    budesonide (PULMICORT) 180 MCG/ACT inhaler Inhale 2 puffs into the lungs 2 (two) times daily.     cetirizine (ZYRTEC) 10 MG tablet Take 10 mg by mouth daily.     EPINEPHrine 0.3 mg/0.3 mL IJ SOAJ injection See admin instructions.     latanoprost (XALATAN) 0.005 % ophthalmic solution 1 drop at bedtime.     meloxicam (MOBIC) 15 MG tablet Take 1 tablet (15 mg total) by mouth every three (3) days as needed for pain. 30 tablet 2   MILK THISTLE PO Take 1 capsule by mouth daily.      ondansetron (ZOFRAN) 4 MG tablet Take 1 tablet (4 mg total) by mouth every 8 (eight) hours as needed for nausea or vomiting. 30 tablet 02   Probiotic Product (PROBIOTIC COMPLEX ACIDOPHILUS PO) Take 1 capsule by mouth daily.     sodium chloride (OCEAN) 0.65 % SOLN nasal spray Place 1 spray into the nose as needed.     telmisartan (MICARDIS) 20 MG tablet TAKE 1 TABLET BY MOUTH EVERY DAY 90 tablet 3   tildrakizumab-asmn (ILUMYA) 100 MG/ML subcutaneous injection Inject 100 mg into the skin every 3 (three) months.     benzonatate (TESSALON PERLES) 100 MG capsule Take 2 capsules (200 mg total) by mouth 2 (two) times daily as needed for cough. 20 capsule 0   Diclofenac Sodium CR 100 MG 24 hr tablet TAKE 1 TABLET BY MOUTH EVERY DAY 90 tablet  1   ezetimibe (ZETIA) 10 MG tablet Take 1 tablet (10 mg total) by mouth daily. 30 tablet 5   metFORMIN (GLUCOPHAGE-XR) 500 MG 24 hr tablet TAKE 1 TABLET (500 MG TOTAL) BY MOUTH DAILY AFTER SUPPER. 90 tablet 1   No facility-administered medications prior to visit.    Review of Systems;  Patient denies headache, fevers, malaise, unintentional weight loss, skin rash, eye pain, sinus congestion and sinus pain, sore throat, dysphagia,  hemoptysis , cough, dyspnea, wheezing, chest pain, palpitations, orthopnea, edema, abdominal pain, nausea, melena, diarrhea, constipation, flank pain, dysuria, hematuria, urinary  Frequency, nocturia, numbness, tingling, seizures,  Focal weakness, Loss of  consciousness,  Tremor, insomnia, depression, anxiety, and suicidal ideation.      Objective:  BP 124/72   Pulse 88   Temp 97.8 F (36.6 C)   Resp 16   Ht '5\' 6"'$  (1.676 m)   Wt 222 lb (100.7 kg)   SpO2 98%   BMI 35.83 kg/m   BP Readings from Last 3 Encounters:  03/22/22 124/72  11/30/21 134/82  09/21/21 126/77    Wt Readings from Last 3 Encounters:  03/22/22 222 lb (100.7 kg)  11/30/21 219 lb 9.6 oz (99.6 kg)  09/21/21 222 lb (100.7 kg)    Physical Exam Vitals reviewed.  Constitutional:      General: She is not in acute distress.    Appearance: Normal appearance. She is obese. She is not ill-appearing, toxic-appearing or diaphoretic.  HENT:     Head: Normocephalic.  Eyes:     General: No scleral icterus.       Right eye: No discharge.        Left eye: No discharge.     Conjunctiva/sclera: Conjunctivae normal.  Cardiovascular:     Rate and Rhythm: Normal rate and regular rhythm.     Heart sounds: Normal heart sounds.  Pulmonary:     Effort: Pulmonary effort is normal. No respiratory distress.     Breath sounds: Normal breath sounds.  Musculoskeletal:        General: Normal range of motion.  Skin:    General: Skin is warm and dry.  Neurological:     General: No focal deficit present.     Mental Status: She is alert and oriented to person, place, and time. Mental status is at baseline.  Psychiatric:        Mood and Affect: Mood normal.        Behavior: Behavior normal.        Thought Content: Thought content normal.        Judgment: Judgment normal.     Lab Results  Component Value Date   HGBA1C 7.1 (H) 03/14/2022   HGBA1C 7.0 (H) 09/16/2021   HGBA1C 6.8 (H) 03/09/2021    Lab Results  Component Value Date   CREATININE 0.80 03/14/2022   CREATININE 0.87 09/16/2021   CREATININE 0.80 03/09/2021    Lab Results  Component Value Date   WBC 11.2 (H) 05/26/2019   HGB 13.9 05/26/2019   HCT 41.1 05/26/2019   PLT 332.0 05/26/2019   GLUCOSE 137 (H)  03/14/2022   CHOL 183 03/14/2022   TRIG 108.0 03/14/2022   HDL 50.10 03/14/2022   LDLDIRECT 140.0 09/16/2021   LDLCALC 112 (H) 03/14/2022   ALT 17 03/14/2022   AST 14 03/14/2022   NA 137 03/14/2022   K 4.6 03/14/2022   CL 102 03/14/2022   CREATININE 0.80 03/14/2022   BUN 13 03/14/2022   CO2 29  03/14/2022   TSH 3.47 06/09/2020   INR 1.1 07/27/2007   HGBA1C 7.1 (H) 03/14/2022   MICROALBUR 1.1 09/16/2021    DG BONE DENSITY (DXA)  Result Date: 03/01/2022 EXAM: DUAL X-RAY ABSORPTIOMETRY (DXA) FOR BONE MINERAL DENSITY IMPRESSION: Referring Physician:  Crecencio Mc Your patient completed a bone mineral density test using GE Lunar iDXA system (analysis version: 16). Technologist: lmn PATIENT: Name: Izabellah, Stetzel Patient ID: [id number] Birth Date: Dec 29, 1945 Height: 65.7 in. Sex: Female Measured: 03/01/2022 Weight: 225.4 lbs. Indications: Advanced Age, Bilateral Ovariectomy (65.51), Caucasian, Diabetic non insulin, Estrogen Deficient, Hysterectomy, Postmenopausal Fractures: NONE Treatments: None ASSESSMENT: The BMD measured at Femur Neck Left is 0.763 g/cm2 with a T-score of -2.0. This patient is considered osteopenic/low bone mass according to [facility]) criteria. The quality of the exam is limited by patient body habitus. L2 was excluded due to degenerative changes. Site Region Measured Date Measured Age [age]A BMD Significant CHANGE T-score DualFemur Neck Left 03/01/2022 77.4 -2.0 0.763 g/cm2 * DualFemur Neck Left 07/07/2004 59.7 -1.6 0.822 g/cm2 AP Spine L1-L4 (L2) 03/01/2022 77.4 0.1 1.177 g/cm2 DualFemur Total Mean 03/01/2022 77.4 -0.7 0.925 g/cm2 Left Forearm Radius 33% 03/01/2022 77.4 -0.2 0.863 g/cm2 World Health Organization [facility]) criteria for post-menopausal, Caucasian Women: Normal       T-score at or above -1 SD Osteopenia   T-score between -1 and -2.5 SD Osteoporosis T-score at or below -2.5 SD RECOMMENDATION: 1. All patients should optimize calcium and vitamin D intake.  2. Consider FDA-approved medical therapies in postmenopausal women and men aged 83 years and older, based on the following: a. A hip or vertebral (clinical or morphometric) fracture. b. T-score = -2.5 at the femoral neck or spine after appropriate evaluation to exclude secondary causes. c. Low bone mass (T-score between -1.0 and -2.5 at the femoral neck or spine) and a 10-year probability of a hip fracture = 3% or a 10-year probability of a major osteoporosis-related fracture = 20% based on the [place]-adapted WHO algorithm. d. Clinician judgment and/or patient preferences may indicate treatment for people with 10-year fracture probabilities above or below these levels. FOLLOW-UP: Patients with diagnosis of osteoporosis or at high risk for fracture should have regular bone mineral density tests.? Patients eligible for Medicare are allowed routine testing every 2 years.? The testing frequency can be increased to one year for patients who have rapidly progressing disease, are receiving or discontinuing medical therapy to restore bone mass, or have additional risk factors. I have reviewed this study and agree with the findings. [facility] FRAX* 10-year Probability of Fracture Based on femoral neck BMD: DualFemur (Left) Major Osteoporotic Fracture: 13.1% Hip Fracture:                3.4% Population:                  [place] (Caucasian) Risk Factors:                None *FRAX is a Materials engineer of the [employer] for Metabolic Bone Disease, a World Pharmacologist (WHO) [facility]. ASSESSMENT: The probability of a major osteoporotic fracture is 13.1% within the next ten years. The probability of a hip fracture is 3.4% within the next ten years. Electronically Signed   By: Zerita Boers M.D.   On: 03/01/2022 08:22    Assessment & Plan:  .Hyperlipidemia associated with type 2 diabetes mellitus ([employer]) -     Lipid panel; Future -  LDL cholesterol, direct;  Future  Type 2 diabetes mellitus with neurological complications [facility]) Assessment & Plan: Managed with metformin which she would liek to suspend due to continued complaints of joint pain and diarreha.  Discussed alternative of rybelsus as an alternative given her obesity and diarrhea predominant I BS.  She is is apprehensive about potential side effects , but has no contraindications,  encouraged to find a physical activity she can tolerate.  She is also zetia intolerant , (diarrhea)  but is taking an ARB    Lab Results  Component Value Date   HGBA1C 7.1 (H) 03/14/2022   Lab Results  Component Value Date   MICROALBUR 1.1 09/16/2021   MICROALBUR <0.7 02/17/2020       Orders: -     Hemoglobin A1c; Future -     Comprehensive metabolic panel; Future -     Microalbumin / creatinine urine ratio; Future  UTI (urinary tract infection) due to Enterococcus Assessment & Plan: Likely triggered by diarrhea.  Resolved with cipro   Plaque psoriasis Assessment & Plan: Managed by Dr Kellie Moor with [facility]    OSA on CPAP Assessment & Plan: She is requesting a new CPAP machine , her current one is 65-10 yrs old    Morbid obesity ([place]) Assessment & Plan: With fatty liver,  Diabetes and hypertension.  She has been unable to lose weight due to chronic foot pain and  remains  discinclined to consider water aerobics and alternative exercises  .  Encouraged to consider Rybelsus    White coat syndrome with diagnosis of hypertension Assessment & Plan:  Reports blood pressures at home usually lower than 130/80.  Currently on telmisartan 20 mg daily.  Continue to monitor blood pressures at home.   Other orders -     metFORMIN HCl ER; Take 1 tablet (500 mg total) by mouth daily after supper.  Dispense: 90 tablet; Refill: 3 -     Fluconazole; Take 1 tablet (150 mg total) by mouth daily.  Dispense: 2 tablet; Refill: 0     I provided 30 minutes of face-to-face time during this encounter  reviewing patient's last visit with me, patient's  most recent visit with  pulmonology,  recent surgical and non surgical procedures, previous  labs and imaging studies, counseling on currently addressed issues,  and post visit ordering to diagnostics and therapeutics .   Follow-up: Return in about 3 months (around 06/22/2022) for follow up diabetes.   Crecencio Mc, MD

## 2022-03-22 NOTE — Assessment & Plan Note (Signed)
Reports blood pressures at home usually lower than 130/80.  Currently on telmisartan 20 mg daily.  Continue to monitor blood pressures at home.

## 2022-03-24 ENCOUNTER — Telehealth: Payer: Self-pay | Admitting: Internal Medicine

## 2022-03-24 NOTE — Telephone Encounter (Signed)
Patient call for her bone density resutls

## 2022-03-27 ENCOUNTER — Encounter: Payer: Self-pay | Admitting: Internal Medicine

## 2022-03-27 DIAGNOSIS — Z780 Asymptomatic menopausal state: Secondary | ICD-10-CM | POA: Insufficient documentation

## 2022-03-28 NOTE — Telephone Encounter (Signed)
Pt is aware of results and gave a verbal understanding.

## 2022-06-15 ENCOUNTER — Other Ambulatory Visit (INDEPENDENT_AMBULATORY_CARE_PROVIDER_SITE_OTHER): Payer: Medicare Other

## 2022-06-15 DIAGNOSIS — Z7984 Long term (current) use of oral hypoglycemic drugs: Secondary | ICD-10-CM | POA: Diagnosis not present

## 2022-06-15 DIAGNOSIS — E1169 Type 2 diabetes mellitus with other specified complication: Secondary | ICD-10-CM | POA: Diagnosis not present

## 2022-06-15 DIAGNOSIS — E785 Hyperlipidemia, unspecified: Secondary | ICD-10-CM | POA: Diagnosis not present

## 2022-06-15 DIAGNOSIS — E1149 Type 2 diabetes mellitus with other diabetic neurological complication: Secondary | ICD-10-CM

## 2022-06-15 LAB — LIPID PANEL
Cholesterol: 201 mg/dL — ABNORMAL HIGH (ref 0–200)
HDL: 50.3 mg/dL (ref >39.00)
LDL Cholesterol: 115 mg/dL — ABNORMAL HIGH (ref 0–99)
NonHDL: 150.82
Total CHOL/HDL Ratio: 4
Triglycerides: 177 mg/dL — ABNORMAL HIGH (ref 0.0–149.0)
VLDL: 35.4 mg/dL (ref 0.0–40.0)

## 2022-06-15 LAB — COMPREHENSIVE METABOLIC PANEL
ALT: 27 U/L (ref 0–35)
AST: 27 U/L (ref 0–37)
Albumin: 4.2 g/dL (ref 3.5–5.2)
Alkaline Phosphatase: 74 U/L (ref 39–117)
BUN: 16 mg/dL (ref 6–23)
CO2: 28 mEq/L (ref 19–32)
Calcium: 9 mg/dL (ref 8.4–10.5)
Chloride: 101 mEq/L (ref 96–112)
Creatinine, Ser: 0.88 mg/dL (ref 0.40–1.20)
GFR: 63.25 mL/min (ref >60.00)
Glucose, Bld: 146 mg/dL — ABNORMAL HIGH (ref 70–99)
Potassium: 4.4 mEq/L (ref 3.5–5.1)
Sodium: 137 mEq/L (ref 135–145)
Total Bilirubin: 1.1 mg/dL (ref 0.2–1.2)
Total Protein: 7.9 g/dL (ref 6.0–8.3)

## 2022-06-15 LAB — LDL CHOLESTEROL, DIRECT: Direct LDL: 139 mg/dL

## 2022-06-15 LAB — HEMOGLOBIN A1C: Hgb A1c MFr Bld: 7.2 % — ABNORMAL HIGH (ref 4.6–6.5)

## 2022-06-15 LAB — MICROALBUMIN / CREATININE URINE RATIO
Creatinine,U: 61.2 mg/dL
Microalb Creat Ratio: 1.1 mg/g (ref 0.0–30.0)
Microalb, Ur: <0.7 mg/dL (ref 0.0–1.9)

## 2022-06-16 ENCOUNTER — Telehealth: Payer: Self-pay

## 2022-06-16 NOTE — Telephone Encounter (Signed)
-----   Message from Sherlene Shams, MD sent at 06/16/2022  8:08 AM EDT ----- Labs are unchanged form previous.  Will discuss in detail at upcoming visit.

## 2022-06-16 NOTE — Telephone Encounter (Signed)
LMTCB in regards to lab results.  

## 2022-06-19 NOTE — Telephone Encounter (Signed)
Pt called back and I read the message to her and she understood 

## 2022-06-19 NOTE — Telephone Encounter (Signed)
noted 

## 2022-06-22 ENCOUNTER — Other Ambulatory Visit (HOSPITAL_COMMUNITY)
Admission: RE | Admit: 2022-06-22 | Discharge: 2022-06-22 | Disposition: A | Discharge status: Home / Self Care | Payer: Medicare Other | Source: Ambulatory Visit | Attending: Internal Medicine | Admitting: Internal Medicine

## 2022-06-22 ENCOUNTER — Encounter: Payer: Self-pay | Admitting: Internal Medicine

## 2022-06-22 ENCOUNTER — Ambulatory Visit (INDEPENDENT_AMBULATORY_CARE_PROVIDER_SITE_OTHER): Payer: Medicare Other | Admitting: Internal Medicine

## 2022-06-22 VITALS — BP 124/65 | HR 86 | Temp 97.6°F | Ht 66.0 in | Wt 222.2 lb

## 2022-06-22 DIAGNOSIS — E1149 Type 2 diabetes mellitus with other diabetic neurological complication: Secondary | ICD-10-CM

## 2022-06-22 DIAGNOSIS — E1169 Type 2 diabetes mellitus with other specified complication: Secondary | ICD-10-CM

## 2022-06-22 DIAGNOSIS — N761 Subacute and chronic vaginitis: Secondary | ICD-10-CM

## 2022-06-22 DIAGNOSIS — Z1151 Encounter for screening for human papillomavirus (HPV): Secondary | ICD-10-CM | POA: Diagnosis not present

## 2022-06-22 DIAGNOSIS — Z01419 Encounter for gynecological examination (general) (routine) without abnormal findings: Secondary | ICD-10-CM | POA: Insufficient documentation

## 2022-06-22 DIAGNOSIS — Z7984 Long term (current) use of oral hypoglycemic drugs: Secondary | ICD-10-CM

## 2022-06-22 DIAGNOSIS — Z79899 Other long term (current) drug therapy: Secondary | ICD-10-CM

## 2022-06-22 DIAGNOSIS — E785 Hyperlipidemia, unspecified: Secondary | ICD-10-CM

## 2022-06-22 MED ORDER — TELMISARTAN 20 MG PO TABS: 20.0000 mg | ORAL_TABLET | Freq: Every day | ORAL | 3 refills | Status: DC

## 2022-06-22 MED ORDER — TRIAMCINOLONE ACETONIDE 0.1 % EX CREA: 1.0000 | TOPICAL_CREAM | Freq: Two times a day (BID) | CUTANEOUS | 2 refills | Status: DC

## 2022-06-22 MED ORDER — FLUCONAZOLE 150 MG PO TABS: 150.0000 mg | ORAL_TABLET | Freq: Every day | ORAL | 0 refills | Status: DC

## 2022-06-22 MED ORDER — RYBELSUS 3 MG PO TABS: 3.0000 mg | ORAL_TABLET | Freq: Every day | ORAL | 0 refills | Status: DC

## 2022-06-22 NOTE — Patient Instructions (Addendum)
Your pelvic exam does NOT suggest that you have a yeast infection as the cause of your itching.  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  We may have to try a STRONGER Steroid cream if there is no improvement in 1-2 weeks, just let me know via mychart  I recommend a trial of Rybelsus for diabetes management.  It may help you lose weight as well.  Take one dose daily (you have been given samples for 30 days. ) and a prescription will be sent to your pharmacy  if you tolerate it (please notify me via mychart)

## 2022-06-22 NOTE — Progress Notes (Signed)
Medication Samples have been provided to the patient.  Drug name: Rybelsus       Strength: 3 mg         Qty: 30 tablets  LOT: [id number]  Exp.Date: 04/16/2023   Dosing instructions: Take 1 tablet daily  The patient has been instructed regarding the correct time, dose, and frequency of taking this medication, including desired effects and most common side effects.   Francene Boyers 10:11 AM 06/22/2022

## 2022-06-22 NOTE — Progress Notes (Signed)
Subjective:  Patient ID: [name], female    DOB: 1946/01/08  Age: 77 y.o. MRN: [id number]  CC: The primary encounter diagnosis was Hyperlipidemia associated with type 2 diabetes mellitus (HCC). Diagnoses of Type 2 diabetes mellitus with neurological complications (HCC), Long-term use of high-risk medication, and Subacute vaginitis were also pertinent to this visit.   HPI [name] presents for  Chief Complaint  Patient presents with   Medical Management of Chronic Issues   1) Type 2 DM:  currently taking no medications for glycemic control due to fear of new side effects.  (Metformin stopped at last visit due to S/e diarrhea and joint pain ) the loose stools have firmed up and are less frequent.  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  Denies clumsiness/weakness of hand but has noticed intermittent tremors of left hand.  She was referred but Cancelled appt with Dr Sherryll Burger   3)  HTN:   home readings <130/80  4) OSA:  can't get a new mask without a  new sleep study .  Wants to postpone home sleep study  5) vaginal itching without discharge.  Improves for a few days after empiric tx with fluconazole.  No recent pelvic  exam.  S/p hysterectomy.  Wears a panty liner for stress incontinence.no deodorant .  Worried about a growth "lie a penis" near her labia.     Pelvic exam done;  areas of decreased pigmentation.  Thin odorless fluid in vaginal vault  tissue friable  Outpatient Medications Prior to Visit  Medication Sig Dispense Refill   albuterol (VENTOLIN HFA) 108 (90 Base) MCG/ACT inhaler Inhale 2 puffs into the lungs every 6 (six) hours as needed for wheezing or shortness of breath.     ALPRAZolam (XANAX) 0.25 MG tablet Take 1 tablet (0.25 mg total) by mouth 2 (two) times daily as needed for anxiety. 30 tablet 0   budesonide (PULMICORT) 180 MCG/ACT  inhaler Inhale 2 puffs into the lungs 2 (two) times daily.     cetirizine (ZYRTEC) 10 MG tablet Take 10 mg by mouth daily.     EPINEPHrine 0.3 mg/0.3 mL IJ SOAJ injection See admin instructions.     meloxicam (MOBIC) 15 MG tablet Take 1 tablet (15 mg total) by mouth every three (3) days as needed for pain. 30 tablet 2   metFORMIN (GLUCOPHAGE-XR) 500 MG 24 hr tablet Take 1 tablet (500 mg total) by mouth daily after supper. 90 tablet 3   MILK THISTLE PO Take 1 capsule by mouth daily.      ondansetron (ZOFRAN) 4 MG tablet Take 1 tablet (4 mg total) by mouth every 8 (eight) hours as needed for nausea or vomiting. 30 tablet 02   Probiotic Product (PROBIOTIC COMPLEX ACIDOPHILUS PO) Take 1 capsule by mouth daily.     sodium chloride (OCEAN) 0.65 % SOLN nasal spray Place 1 spray into the nose as needed.     tildrakizumab-asmn (ILUMYA) 100 MG/ML subcutaneous injection Inject 100 mg into the skin every 3 (three) months.     fluconazole (DIFLUCAN) 150 MG tablet Take 1 tablet (150 mg total) by mouth daily. 2 tablet 0   latanoprost (XALATAN) 0.005 % ophthalmic solution 1 drop at bedtime.     telmisartan (MICARDIS) 20 MG tablet TAKE 1 TABLET BY MOUTH EVERY DAY 90 tablet 3  No facility-administered medications prior to visit.    Review of Systems;  Patient denies headache, fevers, malaise, unintentional weight loss, skin rash, eye pain, sinus congestion and sinus pain, sore throat, dysphagia,  hemoptysis , cough, dyspnea, wheezing, chest pain, palpitations, orthopnea, edema, abdominal pain, nausea, melena, diarrhea, constipation, flank pain, dysuria, hematuria, urinary  Frequency, nocturia, numbness, tingling, seizures,  Focal weakness, Loss of consciousness,  Tremor, insomnia, depression, anxiety, and suicidal ideation.      Objective:  BP 124/65   Pulse 86   Temp 97.6 F (36.4 C)   Ht 5\' 6"  (1.676 m)   Wt 222 lb 3.2 oz (100.8 kg)   SpO2 96%   BMI 35.86 kg/m   BP Readings from Last 3 Encounters:   06/22/22 124/65  03/22/22 124/72  11/30/21 134/82    Wt Readings from Last 3 Encounters:  06/22/22 222 lb 3.2 oz (100.8 kg)  03/22/22 222 lb (100.7 kg)  11/30/21 219 lb 9.6 oz (99.6 kg)    Physical Exam Vitals reviewed.  Constitutional:      General: She is not in acute distress.    Appearance: Normal appearance. She is normal weight. She is not ill-appearing, toxic-appearing or diaphoretic.  HENT:     Head: Normocephalic.  Eyes:     General: No scleral icterus.       Right eye: No discharge.        Left eye: No discharge.     Conjunctiva/sclera: Conjunctivae normal.  Cardiovascular:     Rate and Rhythm: Normal rate and regular rhythm.     Heart sounds: Normal heart sounds.  Pulmonary:     Effort: Pulmonary effort is normal. No respiratory distress.     Breath sounds: Normal breath sounds.  Musculoskeletal:        General: Normal range of motion.  Skin:    General: Skin is warm and dry.  Neurological:     General: No focal deficit present.     Mental Status: She is alert and oriented to person, place, and time. Mental status is at baseline.  Psychiatric:        Mood and Affect: Mood normal.        Behavior: Behavior normal.        Thought Content: Thought content normal.        Judgment: Judgment normal.    Lab Results  Component Value Date   HGBA1C 7.2 (H) 06/15/2022   HGBA1C 7.1 (H) 03/14/2022   HGBA1C 7.0 (H) 09/16/2021    Lab Results  Component Value Date   CREATININE 0.88 06/15/2022   CREATININE 0.80 03/14/2022   CREATININE 0.87 09/16/2021    Lab Results  Component Value Date   WBC 11.2 (H) 05/26/2019   HGB 13.9 05/26/2019   HCT 41.1 05/26/2019   PLT 332.0 05/26/2019   GLUCOSE 146 (H) 06/15/2022   CHOL 201 (H) 06/15/2022   TRIG 177.0 (H) 06/15/2022   HDL 50.30 06/15/2022   LDLDIRECT 139.0 06/15/2022   LDLCALC 115 (H) 06/15/2022   ALT 27 06/15/2022   AST 27 06/15/2022   NA 137 06/15/2022   K 4.4 06/15/2022   CL 101 06/15/2022    CREATININE 0.88 06/15/2022   BUN 16 06/15/2022   CO2 28 06/15/2022   TSH 3.47 06/09/2020   INR 1.1 07/27/2007   HGBA1C 7.2 (H) 06/15/2022   MICROALBUR <0.7 06/15/2022    DG BONE DENSITY (DXA)  Result Date: 03/01/2022 EXAM: DUAL X-RAY ABSORPTIOMETRY (DXA) FOR BONE MINERAL DENSITY IMPRESSION:  Referring Physician:  Sherlene Shams Your patient completed a bone mineral density test using GE Lunar iDXA system (analysis version: 16). Technologist: lmn PATIENT: Name: Stavroula, Nickel Patient ID: [id number] Birth Date: [birth year] Height: 65.7 in. Sex: Female Measured: 03/01/2022 Weight: 225.4 lbs. Indications: Advanced Age, Bilateral Ovariectomy (65.51), Caucasian, Diabetic non insulin, Estrogen Deficient, Hysterectomy, Postmenopausal Fractures: NONE Treatments: None ASSESSMENT: The BMD measured at Femur Neck Left is 0.763 g/cm2 with a T-score of -2.0. This patient is considered osteopenic/low bone mass according to World Health Organization [facility]) criteria. The quality of the exam is limited by patient body habitus. L2 was excluded due to degenerative changes. Site Region Measured Date Measured Age [age]A BMD Significant CHANGE T-score DualFemur Neck Left 2/01/2021 77.4 -2.0 0.763 g/cm2 * DualFemur Neck Left 07/07/2004 59.7 -1.6 0.822 g/cm2 AP Spine L1-L4 (L2) 2/01/2022 77.4 0.1 1.177 g/cm2 DualFemur Total Mean 03/01/2022 77.4 -0.7 0.925 g/cm2 Left Forearm Radius 33% 03/01/2022 77.4 -0.2 0.863 g/cm2 World Health Organization [facility]) criteria for post-menopausal, Caucasian Women: Normal       T-score at or above -1 SD Osteopenia   T-score between -1 and -2.5 SD Osteoporosis T-score at or below -2.5 SD RECOMMENDATION: 1. All patients should optimize calcium and vitamin D intake. 2. Consider FDA-approved medical therapies in postmenopausal women and men aged 20 years and older, based on the following: a. A hip or vertebral (clinical or morphometric) fracture. b. T-score = -2.5 at the femoral neck or spine after appropriate  evaluation to exclude secondary causes. c. Low bone mass (T-score between -1.0 and -2.5 at the femoral neck or spine) and a 10-year probability of a hip fracture = 3% or a 10-year probability of a major osteoporosis-related fracture = 20% based on the [place]-adapted WHO algorithm. d. Clinician judgment and/or patient preferences may indicate treatment for people with 10-year fracture probabilities above or below these levels. FOLLOW-UP: Patients with diagnosis of osteoporosis or at high risk for fracture should have regular bone mineral density tests.? Patients eligible for Medicare are allowed routine testing every 2 years.? The testing frequency can be increased to one year for patients who have rapidly progressing disease, are receiving or discontinuing medical therapy to restore bone mass, or have additional risk factors. I have reviewed this study and agree with the findings. [facility] FRAX* 10-year Probability of Fracture Based on femoral neck BMD: DualFemur (Left) Major Osteoporotic Fracture: 13.1% Hip Fracture:                3.4% Population:                  [place] (Caucasian) Risk Factors:                None *FRAX is a Armed forces logistics/support/administrative officer of the [employer] for Metabolic Bone Disease, a World Science writer (WHO) [employer]. ASSESSMENT: The probability of a major osteoporotic fracture is 13.1% within the next ten years. The probability of a hip fracture is 3.4% within the next ten years. Electronically Signed   By: Romona Curls M.D.   On: 03/01/2022 08:22    Assessment & Plan:  .Hyperlipidemia associated with type 2 diabetes mellitus (HCC) -     Lipid panel; Future -     LDL cholesterol, direct; Future  Type 2 diabetes mellitus with neurological complications (HCC) Assessment & Plan: Previously managed with metformin which she suspended due to continued complaints of joint pain and diarreha.  Discussed alternative of rybelsus as an  alternative  given her obesity and diarrhea predominant I BS.  She is is apprehensive about potential side effects , but has no contraindications,  and has accepted a 30 day sample of the 3 mg dose. She is encouraged to find a physical activity she can tolerate.  She is also zetia intolerant , (diarrhea)  but is taking an ARB    Lab Results  Component Value Date   HGBA1C 7.2 (H) 06/15/2022   Lab Results  Component Value Date   MICROALBUR <0.7 06/15/2022   MICROALBUR 1.1 09/16/2021       Orders: -     Hemoglobin A1c; Future -     Comprehensive metabolic panel; Future  Long-term use of high-risk medication -     TSH; Future -     CBC with Differential/Platelet; Future  Subacute vaginitis Assessment & Plan: Presenting with itching  wit atrophy and scnat discharge on exam. Adding  Estrogen cream   Orders: -     Cytology - PAP  Other orders -     Telmisartan; Take 1 tablet (20 mg total) by mouth daily.  Dispense: 90 tablet; Refill: 3 -     Fluconazole; Take 1 tablet (150 mg total) by mouth daily.  Dispense: 2 tablet; Refill: 0 -     Triamcinolone Acetonide; Apply 1 Application topically 2 (two) times daily. Until itching has resolved  Dispense: 80 g; Refill: 2 -     Rybelsus; Take 1 tablet (3 mg total) by mouth daily.  Dispense: 30 tablet; Refill: 0     I provided 30 minutes of face-to-face time during this encounter reviewing patient's last visit with me, patient's  most recent visit with cardiology,  nephrology,  and neurology,  recent surgical and non surgical procedures, previous  labs and imaging studies, counseling on currently addressed issues,  and post visit ordering to diagnostics and therapeutics .   Follow-up: Return in about 3 months (around 09/21/2022) for follow up diabetes.   Sherlene Shams, MD

## 2022-06-24 DIAGNOSIS — N761 Subacute and chronic vaginitis: Secondary | ICD-10-CM | POA: Insufficient documentation

## 2022-06-24 NOTE — Assessment & Plan Note (Signed)
Presenting with itching  wit atrophy and scnat discharge on exam. Adding  Estrogen cream

## 2022-06-24 NOTE — Assessment & Plan Note (Signed)
Previously managed with metformin which she suspended due to continued complaints of joint pain and diarreha.  Discussed alternative of rybelsus as an alternative given her obesity and diarrhea predominant I BS.  She is is apprehensive about potential side effects , but has no contraindications,  and has accepted a 30 day sample of the 3 mg dose. She is encouraged to find a physical activity she can tolerate.  She is also zetia intolerant , (diarrhea)  but is taking an ARB    Lab Results  Component Value Date   HGBA1C 7.2 (H) 06/15/2022   Lab Results  Component Value Date   MICROALBUR <0.7 06/15/2022   MICROALBUR 1.1 09/16/2021

## 2022-06-27 LAB — CYTOLOGY - PAP
Comment: NEGATIVE
Comment: NEGATIVE
Diagnosis: NEGATIVE
Diagnosis: REACTIVE
High risk HPV: NEGATIVE
Trichomonas: NEGATIVE

## 2022-08-18 ENCOUNTER — Other Ambulatory Visit: Payer: Self-pay | Admitting: Internal Medicine

## 2022-08-18 DIAGNOSIS — Z1231 Encounter for screening mammogram for malignant neoplasm of breast: Secondary | ICD-10-CM

## 2022-09-25 ENCOUNTER — Other Ambulatory Visit: Payer: Medicare Other

## 2022-09-26 ENCOUNTER — Other Ambulatory Visit: Payer: Self-pay | Admitting: Internal Medicine

## 2022-09-27 ENCOUNTER — Ambulatory Visit: Payer: Medicare Other | Admitting: Internal Medicine

## 2022-09-28 ENCOUNTER — Ambulatory Visit: Payer: Medicare Other

## 2022-11-13 ENCOUNTER — Other Ambulatory Visit (INDEPENDENT_AMBULATORY_CARE_PROVIDER_SITE_OTHER): Payer: Medicare Other

## 2022-11-13 DIAGNOSIS — E1169 Type 2 diabetes mellitus with other specified complication: Secondary | ICD-10-CM | POA: Diagnosis not present

## 2022-11-13 DIAGNOSIS — Z79899 Other long term (current) drug therapy: Secondary | ICD-10-CM

## 2022-11-13 DIAGNOSIS — E785 Hyperlipidemia, unspecified: Secondary | ICD-10-CM

## 2022-11-13 DIAGNOSIS — E1149 Type 2 diabetes mellitus with other diabetic neurological complication: Secondary | ICD-10-CM | POA: Diagnosis not present

## 2022-11-13 LAB — LIPID PANEL
Cholesterol: 189 mg/dL (ref 0–200)
HDL: 51.4 mg/dL (ref >39.00)
LDL Cholesterol: 108 mg/dL — ABNORMAL HIGH (ref 0–99)
NonHDL: 137.59
Total CHOL/HDL Ratio: 4
Triglycerides: 150 mg/dL — ABNORMAL HIGH (ref 0.0–149.0)
VLDL: 30 mg/dL (ref 0.0–40.0)

## 2022-11-13 LAB — CBC WITH DIFFERENTIAL/PLATELET
Basophils Absolute: 0.1 10*3/uL (ref 0.0–0.1)
Basophils Relative: 0.5 % (ref 0.0–3.0)
Eosinophils Absolute: 0.2 10*3/uL (ref 0.0–0.7)
Eosinophils Relative: 1.5 % (ref 0.0–5.0)
HCT: 43.1 % (ref 36.0–46.0)
Hemoglobin: 14.1 g/dL (ref 12.0–15.0)
Lymphocytes Relative: 31.2 % (ref 12.0–46.0)
Lymphs Abs: 3.9 10*3/uL (ref 0.7–4.0)
MCHC: 32.6 g/dL (ref 30.0–36.0)
MCV: 87.9 fL (ref 78.0–100.0)
Monocytes Absolute: 0.8 10*3/uL (ref 0.1–1.0)
Monocytes Relative: 6.6 % (ref 3.0–12.0)
Neutro Abs: 7.4 10*3/uL (ref 1.4–7.7)
Neutrophils Relative %: 60.2 % (ref 43.0–77.0)
Platelets: 351 10*3/uL (ref 150.0–400.0)
RBC: 4.91 Mil/uL (ref 3.87–5.11)
RDW: 13.8 % (ref 11.5–15.5)
WBC: 12.4 10*3/uL — ABNORMAL HIGH (ref 4.0–10.5)

## 2022-11-13 LAB — TSH: TSH: 3.43 u[IU]/mL (ref 0.35–5.50)

## 2022-11-13 LAB — COMPREHENSIVE METABOLIC PANEL
ALT: 28 U/L (ref 0–35)
AST: 39 U/L — ABNORMAL HIGH (ref 0–37)
Albumin: 4 g/dL (ref 3.5–5.2)
Alkaline Phosphatase: 78 U/L (ref 39–117)
BUN: 12 mg/dL (ref 6–23)
CO2: 27 meq/L (ref 19–32)
Calcium: 9.2 mg/dL (ref 8.4–10.5)
Chloride: 101 meq/L (ref 96–112)
Creatinine, Ser: 0.85 mg/dL (ref 0.40–1.20)
GFR: 65.75 mL/min (ref >60.00)
Glucose, Bld: 154 mg/dL — ABNORMAL HIGH (ref 70–99)
Potassium: 4.4 meq/L (ref 3.5–5.1)
Sodium: 138 meq/L (ref 135–145)
Total Bilirubin: 1.1 mg/dL (ref 0.2–1.2)
Total Protein: 6.9 g/dL (ref 6.0–8.3)

## 2022-11-13 LAB — HEMOGLOBIN A1C: Hgb A1c MFr Bld: 7.1 % — ABNORMAL HIGH (ref 4.6–6.5)

## 2022-11-13 LAB — LDL CHOLESTEROL, DIRECT: Direct LDL: 127 mg/dL

## 2022-11-17 ENCOUNTER — Emergency Department: Payer: Medicare Other

## 2022-11-17 ENCOUNTER — Other Ambulatory Visit: Payer: Self-pay

## 2022-11-17 ENCOUNTER — Ambulatory Visit (INDEPENDENT_AMBULATORY_CARE_PROVIDER_SITE_OTHER): Payer: Medicare Other | Admitting: Internal Medicine

## 2022-11-17 ENCOUNTER — Inpatient Hospital Stay
Admission: EM | Admit: 2022-11-17 | Discharge: 2022-11-21 | DRG: 321 | Disposition: A | Discharge status: Home / Self Care | Payer: Medicare Other | Attending: Osteopathic Medicine | Admitting: Osteopathic Medicine

## 2022-11-17 ENCOUNTER — Encounter: Payer: Self-pay | Admitting: Internal Medicine

## 2022-11-17 ENCOUNTER — Telehealth: Payer: Self-pay

## 2022-11-17 VITALS — BP 136/78 | HR 102 | Ht 66.0 in | Wt 215.1 lb

## 2022-11-17 DIAGNOSIS — Z87891 Personal history of nicotine dependence: Secondary | ICD-10-CM

## 2022-11-17 DIAGNOSIS — J4520 Mild intermittent asthma, uncomplicated: Secondary | ICD-10-CM | POA: Diagnosis present

## 2022-11-17 DIAGNOSIS — I214 Non-ST elevation (NSTEMI) myocardial infarction: Principal | ICD-10-CM | POA: Diagnosis present

## 2022-11-17 DIAGNOSIS — Z7951 Long term (current) use of inhaled steroids: Secondary | ICD-10-CM

## 2022-11-17 DIAGNOSIS — E1149 Type 2 diabetes mellitus with other diabetic neurological complication: Secondary | ICD-10-CM | POA: Diagnosis present

## 2022-11-17 DIAGNOSIS — E1159 Type 2 diabetes mellitus with other circulatory complications: Secondary | ICD-10-CM | POA: Diagnosis present

## 2022-11-17 DIAGNOSIS — I209 Angina pectoris, unspecified: Secondary | ICD-10-CM

## 2022-11-17 DIAGNOSIS — E119 Type 2 diabetes mellitus without complications: Secondary | ICD-10-CM | POA: Diagnosis present

## 2022-11-17 DIAGNOSIS — I2510 Atherosclerotic heart disease of native coronary artery without angina pectoris: Secondary | ICD-10-CM | POA: Diagnosis present

## 2022-11-17 DIAGNOSIS — E7800 Pure hypercholesterolemia, unspecified: Secondary | ICD-10-CM | POA: Diagnosis not present

## 2022-11-17 DIAGNOSIS — Z9842 Cataract extraction status, left eye: Secondary | ICD-10-CM | POA: Diagnosis not present

## 2022-11-17 DIAGNOSIS — Z90710 Acquired absence of both cervix and uterus: Secondary | ICD-10-CM

## 2022-11-17 DIAGNOSIS — E785 Hyperlipidemia, unspecified: Secondary | ICD-10-CM | POA: Diagnosis present

## 2022-11-17 DIAGNOSIS — G4733 Obstructive sleep apnea (adult) (pediatric): Secondary | ICD-10-CM | POA: Diagnosis present

## 2022-11-17 DIAGNOSIS — Z79899 Other long term (current) drug therapy: Secondary | ICD-10-CM

## 2022-11-17 DIAGNOSIS — J302 Other seasonal allergic rhinitis: Secondary | ICD-10-CM | POA: Insufficient documentation

## 2022-11-17 DIAGNOSIS — I350 Nonrheumatic aortic (valve) stenosis: Secondary | ICD-10-CM | POA: Diagnosis present

## 2022-11-17 DIAGNOSIS — Z882 Allergy status to sulfonamides status: Secondary | ICD-10-CM

## 2022-11-17 DIAGNOSIS — Z8249 Family history of ischemic heart disease and other diseases of the circulatory system: Secondary | ICD-10-CM

## 2022-11-17 DIAGNOSIS — Z791 Long term (current) use of non-steroidal anti-inflammatories (NSAID): Secondary | ICD-10-CM | POA: Diagnosis not present

## 2022-11-17 DIAGNOSIS — R7401 Elevation of levels of liver transaminase levels: Secondary | ICD-10-CM

## 2022-11-17 DIAGNOSIS — D84821 Immunodeficiency due to drugs: Secondary | ICD-10-CM | POA: Diagnosis present

## 2022-11-17 DIAGNOSIS — I5041 Acute combined systolic (congestive) and diastolic (congestive) heart failure: Secondary | ICD-10-CM | POA: Diagnosis not present

## 2022-11-17 DIAGNOSIS — E669 Obesity, unspecified: Secondary | ICD-10-CM | POA: Diagnosis present

## 2022-11-17 DIAGNOSIS — M791 Myalgia, unspecified site: Secondary | ICD-10-CM | POA: Diagnosis not present

## 2022-11-17 DIAGNOSIS — I5043 Acute on chronic combined systolic (congestive) and diastolic (congestive) heart failure: Secondary | ICD-10-CM | POA: Diagnosis present

## 2022-11-17 DIAGNOSIS — I493 Ventricular premature depolarization: Secondary | ICD-10-CM | POA: Diagnosis present

## 2022-11-17 DIAGNOSIS — Z9841 Cataract extraction status, right eye: Secondary | ICD-10-CM

## 2022-11-17 DIAGNOSIS — I700 Atherosclerosis of aorta: Secondary | ICD-10-CM

## 2022-11-17 DIAGNOSIS — Z888 Allergy status to other drugs, medicaments and biological substances status: Secondary | ICD-10-CM | POA: Diagnosis not present

## 2022-11-17 DIAGNOSIS — Z818 Family history of other mental and behavioral disorders: Secondary | ICD-10-CM

## 2022-11-17 DIAGNOSIS — K760 Fatty (change of) liver, not elsewhere classified: Secondary | ICD-10-CM | POA: Diagnosis present

## 2022-11-17 DIAGNOSIS — I110 Hypertensive heart disease with heart failure: Secondary | ICD-10-CM | POA: Diagnosis present

## 2022-11-17 DIAGNOSIS — L400 Psoriasis vulgaris: Secondary | ICD-10-CM | POA: Diagnosis present

## 2022-11-17 DIAGNOSIS — I429 Cardiomyopathy, unspecified: Secondary | ICD-10-CM | POA: Diagnosis present

## 2022-11-17 DIAGNOSIS — F419 Anxiety disorder, unspecified: Secondary | ICD-10-CM | POA: Diagnosis present

## 2022-11-17 DIAGNOSIS — Z7189 Other specified counseling: Secondary | ICD-10-CM | POA: Diagnosis not present

## 2022-11-17 DIAGNOSIS — T466X5A Adverse effect of antihyperlipidemic and antiarteriosclerotic drugs, initial encounter: Secondary | ICD-10-CM | POA: Diagnosis not present

## 2022-11-17 DIAGNOSIS — J45909 Unspecified asthma, uncomplicated: Secondary | ICD-10-CM | POA: Insufficient documentation

## 2022-11-17 DIAGNOSIS — E1169 Type 2 diabetes mellitus with other specified complication: Secondary | ICD-10-CM

## 2022-11-17 DIAGNOSIS — Z6834 Body mass index (BMI) 34.0-34.9, adult: Secondary | ICD-10-CM

## 2022-11-17 DIAGNOSIS — L409 Psoriasis, unspecified: Secondary | ICD-10-CM | POA: Diagnosis present

## 2022-11-17 DIAGNOSIS — I152 Hypertension secondary to endocrine disorders: Secondary | ICD-10-CM

## 2022-11-17 LAB — CBC
HCT: 40.1 % (ref 36.0–46.0)
Hemoglobin: 13.4 g/dL (ref 12.0–15.0)
MCH: 29.5 pg (ref 26.0–34.0)
MCHC: 33.4 g/dL (ref 30.0–36.0)
MCV: 88.3 fL (ref 80.0–100.0)
Platelets: 359 10*3/uL (ref 150–400)
RBC: 4.54 MIL/uL (ref 3.87–5.11)
RDW: 12.7 % (ref 11.5–15.5)
WBC: 16.3 10*3/uL — ABNORMAL HIGH (ref 4.0–10.5)
nRBC: 0 % (ref 0.0–0.2)

## 2022-11-17 LAB — COMPREHENSIVE METABOLIC PANEL
ALT: 20 U/L (ref 0–35)
AST: 26 U/L (ref 0–37)
Albumin: 3.9 g/dL (ref 3.5–5.2)
Alkaline Phosphatase: 76 U/L (ref 39–117)
BUN: 14 mg/dL (ref 6–23)
CO2: 23 meq/L (ref 19–32)
Calcium: 9.1 mg/dL (ref 8.4–10.5)
Chloride: 103 meq/L (ref 96–112)
Creatinine, Ser: 0.76 mg/dL (ref 0.40–1.20)
GFR: 75.19 mL/min (ref >60.00)
Glucose, Bld: 139 mg/dL — ABNORMAL HIGH (ref 70–99)
Potassium: 4.3 meq/L (ref 3.5–5.1)
Sodium: 137 meq/L (ref 135–145)
Total Bilirubin: 1.5 mg/dL — ABNORMAL HIGH (ref 0.2–1.2)
Total Protein: 7.1 g/dL (ref 6.0–8.3)

## 2022-11-17 LAB — CBG MONITORING, ED
Glucose-Capillary: 133 mg/dL — ABNORMAL HIGH (ref 70–99)
Glucose-Capillary: 182 mg/dL — ABNORMAL HIGH (ref 70–99)

## 2022-11-17 LAB — BASIC METABOLIC PANEL
Anion gap: 9 (ref 5–15)
BUN: 17 mg/dL (ref 8–23)
CO2: 24 mmol/L (ref 22–32)
Calcium: 8.6 mg/dL — ABNORMAL LOW (ref 8.9–10.3)
Chloride: 103 mmol/L (ref 98–111)
Creatinine, Ser: 0.81 mg/dL (ref 0.44–1.00)
GFR, Estimated: >60 mL/min (ref >60)
Glucose, Bld: 143 mg/dL — ABNORMAL HIGH (ref 70–99)
Potassium: 3.9 mmol/L (ref 3.5–5.1)
Sodium: 136 mmol/L (ref 135–145)

## 2022-11-17 LAB — CK TOTAL AND CKMB (NOT AT ARMC)
CK, MB: 3 ng/mL (ref 0–5.0)
Relative Index: 2.5 (ref 0–4.0)
Total CK: 118 U/L (ref 29–143)

## 2022-11-17 LAB — PROTIME-INR
INR: 1.1 (ref 0.8–1.2)
Prothrombin Time: 14.3 s (ref 11.4–15.2)

## 2022-11-17 LAB — APTT: aPTT: 25 s (ref 24–36)

## 2022-11-17 LAB — TROPONIN I (HIGH SENSITIVITY)
High Sens Troponin I: 1228 ng/L (ref 2–17)
Troponin I (High Sensitivity): 1560 ng/L (ref <18)
Troponin I (High Sensitivity): 1483 ng/L (ref <18)

## 2022-11-17 MED ORDER — ASPIRIN 81 MG PO TBEC
81.0000 mg | DELAYED_RELEASE_TABLET | Freq: Every day | ORAL | Status: DC
  Administered 2022-11-18 – 2022-11-21 (×3): 81 mg via ORAL
  Filled 2022-11-17 (×3): qty 1

## 2022-11-17 MED ORDER — IOHEXOL 350 MG/ML SOLN
75.0000 mL | Freq: Once | INTRAVENOUS | Status: AC | PRN
  Administered 2022-11-17: 65 mL via INTRAVENOUS

## 2022-11-17 MED ORDER — INSULIN ASPART 100 UNIT/ML IJ SOLN
0.0000 [IU] | INTRAMUSCULAR | Status: DC
  Filled 2022-11-17 (×2): qty 1

## 2022-11-17 MED ORDER — METOPROLOL SUCCINATE ER 25 MG PO TB24: 25.0000 mg | ORAL_TABLET | Freq: Every day | ORAL | 3 refills | Status: DC

## 2022-11-17 MED ORDER — NITROGLYCERIN 0.4 MG SL SUBL: 0.4000 mg | SUBLINGUAL_TABLET | SUBLINGUAL | Status: DC | PRN

## 2022-11-17 MED ORDER — TELMISARTAN 20 MG PO TABS: 10.0000 mg | ORAL_TABLET | Freq: Every day | ORAL | 1 refills | Status: DC

## 2022-11-17 MED ORDER — METOPROLOL SUCCINATE ER 25 MG PO TB24
25.0000 mg | ORAL_TABLET | Freq: Every day | ORAL | Status: DC
  Administered 2022-11-17 – 2022-11-19 (×3): 25 mg via ORAL
  Filled 2022-11-17 (×3): qty 1

## 2022-11-17 MED ORDER — ALPRAZOLAM 0.25 MG PO TABS: 0.2500 mg | ORAL_TABLET | Freq: Two times a day (BID) | ORAL | 0 refills | Status: AC | PRN

## 2022-11-17 MED ORDER — ALPRAZOLAM 0.25 MG PO TABS: 0.2500 mg | ORAL_TABLET | Freq: Two times a day (BID) | ORAL | Status: DC | PRN

## 2022-11-17 MED ORDER — HEPARIN BOLUS VIA INFUSION
4000.0000 [IU] | Freq: Once | INTRAVENOUS | Status: AC
  Administered 2022-11-17: 4000 [IU] via INTRAVENOUS
  Filled 2022-11-17: qty 4000

## 2022-11-17 MED ORDER — HEPARIN (PORCINE) 25000 UT/250ML-% IV SOLN
1400.0000 [IU]/h | INTRAVENOUS | Status: DC
  Administered 2022-11-17: 1000 [IU]/h via INTRAVENOUS
  Administered 2022-11-19 – 2022-11-20 (×2): 1250 [IU]/h via INTRAVENOUS
  Filled 2022-11-17 (×4): qty 250

## 2022-11-17 MED ORDER — ACETAMINOPHEN 325 MG PO TABS: 650.0000 mg | ORAL_TABLET | ORAL | Status: DC | PRN

## 2022-11-17 MED ORDER — ONDANSETRON HCL 4 MG/2ML IJ SOLN: 4.0000 mg | Freq: Four times a day (QID) | INTRAMUSCULAR | Status: DC | PRN

## 2022-11-17 NOTE — Assessment & Plan Note (Signed)
Untreated due to statin myalgia.  Trial of zetia was also  stopped  due to intolerance of medication.

## 2022-11-17 NOTE — Telephone Encounter (Signed)
Patient states she believes she may need a follow-up appointment with Dr. Duncan Dull, but she does not know when.  I was unable to reach Sandy Salaam, CMA, at the time of check-out and there are no check-out instructions.

## 2022-11-17 NOTE — Telephone Encounter (Signed)
CRITICAL VALUE STICKER  CRITICAL VALUE: Troponin 1,228  RECEIVER (on-site recipient of call): Shanda Bumps, CMA  DATE & TIME NOTIFIED: 11/17/2022 @ 2:18pm  MESSENGER (representative from lab):Saa  MD NOTIFIED: Dr. Birdie Sons  TIME OF NOTIFICATION: 2:19pm  RESPONSE:   Spoke with Dr. Birdie Sons about the critical lab value. He advised that pt go to the ED by EMS or if refused by EMS she would need to have someone drive her to the ED. I spoke with pt and she stated that she would have her husband take her to the ED now.

## 2022-11-17 NOTE — H&P (Signed)
History and Physical    Patient: ALAJA Fletcher VHQ:[id number] DOB: 03/24/1945 DOA: 11/17/2022 DOS: the patient was seen and examined on 11/17/2022 PCP: Sherlene Shams, MD  Patient coming from: Home  Chief Complaint:  Chief Complaint  Patient presents with   ABNORMAL LABS    HPI: [name] is a 77 y.o. 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  She initially presented with a 3-day history of recurrent substernal chest pain radiating to jaw, both shoulders and left arm, improved with deep breathing.  Last nuclear stress test was in 2017.  By arrival to the ED she denied any of the symptoms. ED course and data review: Mild tachycardia at 101 but otherwise unremarkable vitals Labs troponin 1568-->1483 CBC notable for WBC of 16,000 BMP unremarkable EKG, personally viewed and interpreted showing sinus tachycardia at 102 now with a septal infarct present and ST depression in lateral leads CTA chest showed no evidence of PE or active pulmonary disease and mild cardiac enlargement with prominent coronary artery calcifications The ED provider spoke with on-call cardiologist that did not think patient was having a STEMI Patient started on heparin infusion Hospitalist consulted for admission.   Review of Systems: As mentioned in the history of present illness. All other systems reviewed and are negative.  Past Medical History:  Diagnosis Date   Asthma    Asthma due to environmental allergies    grass, mold, trees, dust   Cataract    Cellulitis    LEFT LEG   Diabetes mellitus    CONTROLLED ON DIET ALONE   Edema leg    LEFT LEG...CHRONIC   Endometrial hyperplasia    External hemorrhoid    Fatty liver    Glaucoma    Hepatic cyst    STABLE  PER 05/20/2008 ULTRASOUND   Hypertension    borderline...controlled since taking herself of HCTZ IN JULY   IBS (irritable bowel syndrome)    Murmur, cardiac    Neuropathy    OSA on CPAP    Osteoarthritis    Psoriasis    Sleep apnea    CPAP   Past Surgical History:  Procedure Laterality Date   BONE DENSITY TEST  01/17/2004   CATARACT EXTRACTION, BILATERAL Bilateral    May 2023 and June 2023   HYSTEROSCOPY WITH D & C  01/16/2005   BC OF ABNORAL PAP AND ENDOMETRIAL HYPERPLASIA   ONE VAGINAL DELIVERY     UMBILICAL HERNIA REPAIR  01/17/2008   VAGINAL HYSTERECTOMY  01/17/2008   Social History:  reports that she quit smoking about 44 years ago. Her smoking use included cigarettes. She has never used smokeless tobacco. She reports that she does not drink alcohol and does not use drugs.  Allergies  Allergen Reactions   Meloxicam Other (See Comments)    Hypertension, tachycardia   Sulfa Drugs Cross Reactors    Zostavax [employer] Vaccine Live]     Bad reaction     Family History  Problem Relation Age of Onset   Mental illness Mother        DEMENTIA   Heart disease Father 88       MI.Marland KitchenMarland KitchenS/D AVR/CABG x3   Cancer Neg Hx        no ovarian colon breast   Breast  cancer Neg Hx     Prior to Admission medications   Medication Sig Start Date End Date Taking? Authorizing Provider  albuterol (VENTOLIN HFA) 108 (90 Base) MCG/ACT inhaler Inhale 2 puffs into the lungs every 6 (six) hours as needed for wheezing or shortness of breath.    [provider]  ALPRAZolam Prudy Feeler) 0.25 MG tablet Take 1 tablet (0.25 mg total) by mouth 2 (two) times daily as needed for anxiety. 11/17/22   Sherlene Shams, MD  budesonide (PULMICORT) 180 MCG/ACT inhaler Inhale 2 puffs into the lungs 2 (two) times daily.    [provider]  cetirizine (ZYRTEC) 10 MG tablet Take 10 mg by mouth daily.    [provider]  EPINEPHrine 0.3 mg/0.3 mL IJ SOAJ injection See admin instructions. 07/23/18    [provider]  LUMIGAN 0.01 % SOLN Place 1 drop into both eyes at bedtime. 10/24/22   [provider]  meloxicam (MOBIC) 15 MG tablet TAKE 1 TABLET (15 MG TOTAL) BY MOUTH EVERY THREE (3) DAYS AS NEEDED FOR PAIN. 09/26/22   Sherlene Shams, MD  metoprolol succinate (TOPROL-XL) 25 MG 24 hr tablet Take 1 tablet (25 mg total) by mouth at bedtime. 11/17/22   Sherlene Shams, MD  MILK THISTLE PO Take 1 capsule by mouth daily.     [provider]  ondansetron (ZOFRAN) 4 MG tablet Take 1 tablet (4 mg total) by mouth every 8 (eight) hours as needed for nausea or vomiting. 09/21/21   Sherlene Shams, MD  Probiotic Product (PROBIOTIC COMPLEX ACIDOPHILUS PO) Take 1 capsule by mouth daily.    [provider]  sodium chloride (OCEAN) 0.65 % SOLN nasal spray Place 1 spray into the nose as needed.    [provider]  telmisartan (MICARDIS) 20 MG tablet Take 0.5 tablets (10 mg total) by mouth at bedtime. 11/17/22   Sherlene Shams, MD  tildrakizumab-asmn [facility]) 100 MG/ML subcutaneous injection Inject 100 mg into the skin every 3 (three) months.    [provider]  triamcinolone cream (KENALOG) 0.1 % Apply 1 Application topically 2 (two) times daily. Until itching has resolved 06/22/22   Sherlene Shams, MD    Physical Exam: Vitals:   11/17/22 2000 11/17/22 2004 11/17/22 2009 11/17/22 2130  BP: (!) 144/65  (!) 144/65 134/64  Pulse: 98  95 94  Resp: 18   (!) 21  Temp:  98.4 F (36.9 C)    TempSrc:  Oral    SpO2: 99%   97%   Physical Exam Vitals and nursing note reviewed.  Constitutional:      General: She is not in acute distress. HENT:     Head: Normocephalic and atraumatic.  Cardiovascular:     Rate and Rhythm: Normal rate and regular rhythm.     Heart sounds: Normal heart sounds.  Pulmonary:     Effort: Pulmonary effort is normal.     Breath sounds: Normal breath sounds.  Abdominal:     Palpations: Abdomen is soft.     Tenderness: There is no  abdominal tenderness.  Neurological:     Mental Status: Mental status is at baseline.     Labs on Admission: I have personally reviewed following labs and imaging studies  CBC: Recent Labs  Lab 11/13/22 0845 11/17/22 1812  WBC 12.4* 16.3*  NEUTROABS 7.4  --   HGB 14.1 13.4  HCT 43.1 40.1  MCV 87.9 88.3  PLT 351.0 359   Basic Metabolic  Panel: Recent Labs  Lab 11/13/22 0845 11/17/22 1002 11/17/22 1646  NA 138 137 136  K 4.4 4.3 3.9  CL 101 103 103  CO2 27 23 24   GLUCOSE 154* 139* 143*  BUN 12 14 17   CREATININE 0.85 0.76 0.81  CALCIUM 9.2 9.1 8.6*   GFR: Estimated Creatinine Clearance: 67.4 mL/min (by C-G formula based on SCr of 0.81 mg/dL). Liver Function Tests: Recent Labs  Lab 11/13/22 0845 11/17/22 1002  AST 39* 26  ALT 28 20  ALKPHOS 78 76  BILITOT 1.1 1.5*  PROT 6.9 7.1  ALBUMIN 4.0 3.9   No results for input(s): "LIPASE", "AMYLASE" in the last 168 hours. No results for input(s): "AMMONIA" in the last 168 hours. Coagulation Profile: Recent Labs  Lab 11/17/22 1812  INR 1.1   Cardiac Enzymes: Recent Labs  Lab 11/17/22 1002  CKTOTAL 118  CKMB 3.0   BNP (last 3 results) No results for input(s): "PROBNP" in the last 8760 hours. HbA1C: No results for input(s): "HGBA1C" in the last 72 hours. CBG: Recent Labs  Lab 11/17/22 2012 11/17/22 2304  GLUCAP 133* 182*   Lipid Profile: No results for input(s): "CHOL", "HDL", "LDLCALC", "TRIG", "CHOLHDL", "LDLDIRECT" in the last 72 hours. Thyroid Function Tests: No results for input(s): "TSH", "T4TOTAL", "FREET4", "T3FREE", "THYROIDAB" in the last 72 hours. Anemia Panel: No results for input(s): "VITAMINB12", "FOLATE", "FERRITIN", "TIBC", "IRON", "RETICCTPCT" in the last 72 hours. Urine analysis:    Component Value Date/Time   COLORURINE YELLOW 03/14/2022 1407   APPEARANCEUR Sl Cloudy (A) 03/14/2022 1407   LABSPEC 1.020 03/14/2022 1407   PHURINE 6.0 03/14/2022 1407   GLUCOSEU NEGATIVE 03/14/2022  1407   HGBUR TRACE-INTACT (A) 03/14/2022 1407   BILIRUBINUR NEGATIVE 03/14/2022 1407   BILIRUBINUR 1+ 11/30/2021 1001   KETONESUR NEGATIVE 03/14/2022 1407   PROTEINUR neg 10/15/2014 0952   PROTEINUR 30 (A) 07/27/2007 1730   UROBILINOGEN 0.2 03/14/2022 1407   NITRITE POSITIVE (A) 03/14/2022 1407   LEUKOCYTESUR NEGATIVE 03/14/2022 1407    Radiological Exams on Admission: CT Angio Chest PE W/Cm &/Or Wo Cm  Result Date: 11/17/2022 CLINICAL DATA:  Pulmonary embolus suspected with high probability. Elevated troponin levels. EXAM: CT ANGIOGRAPHY CHEST WITH CONTRAST TECHNIQUE: Multidetector CT imaging of the chest was performed using the standard protocol during bolus administration of intravenous contrast. Multiplanar CT image reconstructions and MIPs were obtained to evaluate the vascular anatomy. RADIATION DOSE REDUCTION: This exam was performed according to the departmental dose-optimization program which includes automated exposure control, adjustment of the mA and/or kV according to patient size and/or use of iterative reconstruction technique. CONTRAST:  65mL OMNIPAQUE IOHEXOL 350 MG/ML SOLN COMPARISON:  Chest radiograph 11/17/2022 FINDINGS: Cardiovascular: Technically adequate study with good opacification of the central and segmental pulmonary arteries. Moderate motion artifact. No focal filling defects. No evidence of significant pulmonary embolus. Mild cardiac enlargement. No pericardial effusions. Normal caliber thoracic aorta. Few scattered calcifications in the aorta. Prominent coronary artery calcification. Mediastinum/Nodes: No enlarged mediastinal, hilar, or axillary lymph nodes. Thyroid gland, trachea, and esophagus demonstrate no significant findings. Lungs/Pleura: Mild dependent atelectasis in the lung bases. No airspace disease or consolidation. No pleural effusions. No pneumothorax. Upper Abdomen: No acute abnormalities. Musculoskeletal: Degenerative changes in the spine. No acute bony  abnormalities. Review of the MIP images confirms the above findings. IMPRESSION: 1. No evidence of significant pulmonary embolus. 2. Cardiac enlargement with prominent coronary artery calcifications. 3. Mild dependent atelectasis in the lungs. No evidence of active pulmonary disease. Electronically Signed  By: Burman Nieves M.D.   On: 11/17/2022 18:44   DG Chest 2 View  Result Date: 11/17/2022 CLINICAL DATA:  Elevated troponin EXAM: CHEST - 2 VIEW COMPARISON:  03/05/2018 FINDINGS: Mild cardiac enlargement. No vascular congestion, edema, or consolidation. Blunting of the costophrenic angles suggests small bilateral pleural effusions. No pneumothorax. Mediastinal contours appear intact. Degenerative changes in the spine. IMPRESSION: 1. Cardiac enlargement. 2. Small bilateral pleural effusions. 3. Lungs are otherwise clear. Electronically Signed   By: Burman Nieves M.D.   On: 11/17/2022 18:41     Data Reviewed: Relevant notes from primary care and specialist visits, past discharge summaries as available in EHR, including Care Everywhere. Prior diagnostic testing as pertinent to current admission diagnoses Updated medications and problem lists for reconciliation ED course, including vitals, labs, imaging, treatment and response to treatment Triage notes, nursing and pharmacy notes and ED provider's notes Notable results as noted in HPI   Assessment and Plan: * NSTEMI (non-ST elevated myocardial infarction) [facility]) Suspect patient might have had a completed MI given downtrending troponins 1568--> 1483 EKG showing a new septal infarct with ST depression CTA chest showed prominent coronary artery calcifications Cardiology she was consulted from the ED and recommended heparin infusion Continue heparin infusion, aspirin and metoprolol Follow-up lipoprotein a and add statin as needed Formal cardiology consult placed Will keep n.p.o. 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  Discussed plan of care they voiced understanding and agreement with plan.  They asked a lot of questions which I believe were answered to their satisfaction  Disposition Plan: Back to previous home environment  Severity of Illness: The appropriate patient status for this patient is INPATIENT. Inpatient status is judged to be reasonable and necessary in order to provide the required intensity of service to ensure the patient's safety. The patient's presenting symptoms, physical exam findings, and initial radiographic and laboratory data in the context of their chronic comorbidities is felt to place them at high risk for further clinical deterioration. Furthermore, it is not anticipated that the patient will be medically stable for discharge from the hospital within 2 midnights of admission.   * I certify that at the point of admission it is my clinical judgment that the patient will require inpatient hospital care spanning beyond 2 midnights from the point of admission due to high intensity of service, high risk for further deterioration and high frequency of surveillance required.*  Author: Andris Baumann, MD 11/17/2022 11:55 PM  For on call review [email or web address].

## 2022-11-17 NOTE — ED Notes (Signed)
Lab called about abnormal Troponin - 1560. EDP notified.

## 2022-11-17 NOTE — Progress Notes (Signed)
PHARMACY - ANTICOAGULATION CONSULT NOTE  Pharmacy Consult for Heparin Infusion Indication: chest pain/ACS  Allergies  Allergen Reactions   Meloxicam Other (See Comments)    Hypertension, tachycardia   Sulfa Drugs Cross Reactors    Zostavax [Zoster Vaccine Live]     Bad reaction     Patient Measurements:   Heparin Dosing Weight: 81.2 kg  Vital Signs: Temp: 98.5 F (36.9 C) (11/01 1538) BP: 149/76 (11/01 1600) Pulse Rate: 101 (11/01 1538)  Labs: Recent Labs    11/17/22 1002 11/17/22 1646  CREATININE 0.76 0.81  CKTOTAL 118  --   CKMB 3.0  --   TROPONINIHS  --  1,560*    Estimated Creatinine Clearance: 67.4 mL/min (by C-G formula based on SCr of 0.81 mg/dL).   Medical History: Past Medical History:  Diagnosis Date   Asthma    Asthma due to environmental allergies    grass, mold, trees, dust   Cataract    Cellulitis    LEFT LEG   Diabetes mellitus    CONTROLLED ON DIET ALONE   Edema leg    LEFT LEG...CHRONIC   Endometrial hyperplasia    External hemorrhoid    Fatty liver    Glaucoma    Hepatic cyst    STABLE PER 05/20/2008 ULTRASOUND   Hypertension    borderline...controlled since taking herself of HCTZ IN JULY   IBS (irritable bowel syndrome)    Murmur, cardiac    Neuropathy    OSA on CPAP    Osteoarthritis    Psoriasis    Sleep apnea    CPAP   Assessment: Patient is a 77yo female with elevated troponin. Pharmacy consulted for Heparin dosing.  Goal of Therapy:  Heparin level 0.3-0.7 units/ml Monitor platelets by anticoagulation protocol: Yes   Plan:  Give 4000 units bolus x 1 Start heparin infusion at 1000 units/hr Check anti-Xa level in 8 hours and daily while on heparin Continue to monitor H&H and platelets  Clovia Cuff, PharmD, BCPS 11/17/2022 5:32 PM

## 2022-11-17 NOTE — Assessment & Plan Note (Signed)
Plaque psoriasis Patient on Ilumya injection every 3 months

## 2022-11-17 NOTE — Assessment & Plan Note (Addendum)
Suspect patient might have had a completed MI given downtrending troponins 1568--> 1483 EKG showing a new septal infarct with ST depression CTA chest showed prominent coronary artery calcifications Cardiology she was consulted from the ED and recommended heparin infusion Continue heparin infusion, aspirin and metoprolol Follow-up lipoprotein a and add statin as needed Formal cardiology consult placed Will keep n.p.o. for further risk stratification tomorrow either with stress test or cardiac cath as determined by cardiology Echo ordered to evaluate for wall motion abnormality

## 2022-11-17 NOTE — Patient Instructions (Addendum)
Skip your blood pressure tomorrow morning and take 1/2 tablet at night instead (telmisartan)along with a very low dose of metoprolol  to protect your heart   Your EKG looks unchanged from 2017,  but If you have another episode of chest pain,  jaw pain,  or breast pain.  Go to the nearest ER because it could be your heart or your gallbladder    Referral to Dr Antoine Poche is in progress   Home sleep study has been ordered.

## 2022-11-17 NOTE — Telephone Encounter (Signed)
Agree with documentation.

## 2022-11-17 NOTE — ED Provider Notes (Signed)
[facility] Provider Note   Event Date/Time   First MD Initiated Contact with Patient 11/17/22 1557     (approximate) History  ABNORMAL LABS  HPI [name] is a 77 y.o. female with a stated past medical history of type 2 diabetes, hypertension, and asthma who presents from a primary care provider visit after a troponin level of greater than 1200.  Patient relates a story few days prior to arrival in which she had neck and bilateral shoulder pain with associated palpitations however denies any significant pain in the chest.  Patient states that days following she would get abnormal rhythm readings on her blood pressure monitor.  Patient denies any history of arrhythmia.  ROS: Patient currently denies any vision changes, tinnitus, difficulty speaking, facial droop, sore throat, chest pain, shortness of breath, abdominal pain, nausea/vomiting/diarrhea, dysuria, or weakness/numbness/paresthesias in any extremity   Physical Exam  Triage Vital Signs: ED Triage Vitals  Encounter Vitals Group     BP 11/17/22 1538 (!) 150/81     Systolic BP Percentile --      Diastolic BP Percentile --      Pulse Rate 11/17/22 1538 (!) 101     Resp 11/17/22 1538 16     Temp 11/17/22 1538 98.5 F (36.9 C)     Temp src --      SpO2 11/17/22 1538 96 %     Weight --      Height --      Head Circumference --      Peak Flow --      Pain Score 11/17/22 1542 0     Pain Loc --      Pain Education --      Exclude from Growth Chart --    Most recent vital signs: Vitals:   11/17/22 1538 11/17/22 1600  BP: (!) 150/81 (!) 149/76  Pulse: (!) 101   Resp: 16 15  Temp: 98.5 F (36.9 C)   SpO2: 96%    General: Awake, oriented x4. CV:  Good peripheral perfusion.  Resp:  Normal effort.  Abd:  No distention.  Other:  Elderly obese Caucasian female resting comfortably in no acute distress ED Results / Procedures / Treatments  Labs (all labs ordered are listed, but only abnormal  results are displayed) Labs Reviewed  BASIC METABOLIC PANEL - Abnormal; Notable for the following components:      Result Value   Glucose, Bld 143 (*)    Calcium 8.6 (*)    All other components within normal limits  CBC - Abnormal; Notable for the following components:   WBC 16.3 (*)    All other components within normal limits  TROPONIN I (HIGH SENSITIVITY) - Abnormal; Notable for the following components:   Troponin I (High Sensitivity) 1,560 (*)    All other components within normal limits  TROPONIN I (HIGH SENSITIVITY) - Abnormal; Notable for the following components:   Troponin I (High Sensitivity) 1,483 (*)    All other components within normal limits  APTT  PROTIME-INR  HEPARIN LEVEL (UNFRACTIONATED)  CBC   EKG ED ECG REPORT I, Merwyn Katos, the attending physician, personally viewed and interpreted this ECG. Date: 11/17/2022 EKG Time: 1542 Rate: 102 Rhythm: Tachycardic sinus rhythm QRS Axis: normal Intervals: normal ST/T Wave abnormalities: normal Narrative Interpretation: Tachycardic sinus rhythm.  No evidence of acute ischemia RADIOLOGY ED MD interpretation: CT angiography of the chest interpreted independently by me and shows no evidence of acute abnormalities.  There is cardiac enlargement with prominent coronary artery calcifications  2 view chest x-ray interpreted by me shows no evidence of acute abnormalities including no pneumonia, pneumothorax, or widened mediastinum -Agree with radiology assessment Official radiology report(s): CT Angio Chest PE W/Cm &/Or Wo Cm  Result Date: 11/17/2022 CLINICAL DATA:  Pulmonary embolus suspected with high probability. Elevated troponin levels. EXAM: CT ANGIOGRAPHY CHEST WITH CONTRAST TECHNIQUE: Multidetector CT imaging of the chest was performed using the standard protocol during bolus administration of intravenous contrast. Multiplanar CT image reconstructions and MIPs were obtained to evaluate the vascular anatomy.  RADIATION DOSE REDUCTION: This exam was performed according to the departmental dose-optimization program which includes automated exposure control, adjustment of the mA and/or kV according to patient size and/or use of iterative reconstruction technique. CONTRAST:  65mL OMNIPAQUE IOHEXOL 350 MG/ML SOLN COMPARISON:  Chest radiograph 11/17/2022 FINDINGS: Cardiovascular: Technically adequate study with good opacification of the central and segmental pulmonary arteries. Moderate motion artifact. No focal filling defects. No evidence of significant pulmonary embolus. Mild cardiac enlargement. No pericardial effusions. Normal caliber thoracic aorta. Few scattered calcifications in the aorta. Prominent coronary artery calcification. Mediastinum/Nodes: No enlarged mediastinal, hilar, or axillary lymph nodes. Thyroid gland, trachea, and esophagus demonstrate no significant findings. Lungs/Pleura: Mild dependent atelectasis in the lung bases. No airspace disease or consolidation. No pleural effusions. No pneumothorax. Upper Abdomen: No acute abnormalities. Musculoskeletal: Degenerative changes in the spine. No acute bony abnormalities. Review of the MIP images confirms the above findings. IMPRESSION: 1. No evidence of significant pulmonary embolus. 2. Cardiac enlargement with prominent coronary artery calcifications. 3. Mild dependent atelectasis in the lungs. No evidence of active pulmonary disease. Electronically Signed   By: Burman Nieves M.D.   On: 11/17/2022 18:44   DG Chest 2 View  Result Date: 11/17/2022 CLINICAL DATA:  Elevated troponin EXAM: CHEST - 2 VIEW COMPARISON:  03/05/2018 FINDINGS: Mild cardiac enlargement. No vascular congestion, edema, or consolidation. Blunting of the costophrenic angles suggests small bilateral pleural effusions. No pneumothorax. Mediastinal contours appear intact. Degenerative changes in the spine. IMPRESSION: 1. Cardiac enlargement. 2. Small bilateral pleural effusions. 3. Lungs  are otherwise clear. Electronically Signed   By: Burman Nieves M.D.   On: 11/17/2022 18:41   PROCEDURES: Critical Care performed: Yes, see critical care procedure note(s) .1-3 Lead EKG Interpretation  Performed by: Merwyn Katos, MD Authorized by: Merwyn Katos, MD     Interpretation: abnormal     ECG rate:  111   ECG rate assessment: tachycardic     Rhythm: sinus tachycardia     Ectopy: none     Conduction: normal    MEDICATIONS ORDERED IN ED: Medications  heparin ADULT infusion 100 units/mL (25000 units/231mL) (1,000 Units/hr Intravenous New Bag/Given 11/17/22 1827)  heparin bolus via infusion 4,000 Units (4,000 Units Intravenous Bolus from Bag 11/17/22 1827)  iohexol (OMNIPAQUE) 350 MG/ML injection 75 mL (65 mLs Intravenous Contrast Given 11/17/22 1739)   IMPRESSION / MDM / ASSESSMENT AND PLAN / ED COURSE  I reviewed the triage vital signs and the nursing notes.                             The patient is on the cardiac monitor to evaluate for evidence of arrhythmia and/or significant heart rate changes. Patient's presentation is most consistent with acute presentation with potential threat to life or bodily function. Workup: ECG, CXR, CBC, CMP, Troponin Findings: ECG: No overt evidence of STEMI.  No evidence of Brugada's sign, delta wave, epsilon wave, significantly prolonged QTc, or malignant arrhythmia Troponin: 1483 Other Labs unremarkable for emergent problems. CXR: Without PTX, PNA, or widened mediastinum Last Stress Test:  2017 Last Heart Catheterization: NA HEART Score: 5  Given History, Exam, and Workup concern for NSTEMI.  I have low suspicion for Pneumothorax, Pneumonia, Pulmonary Embolus, Tamponade, Aortic Dissection  Interventions: ASA 324or325mg  Heparin Bolus 60-70u/kg (max 5000) Heparin gtt about 12-15u/kg/hr (max 1000/hr) PRN analgesia with fentanyl, morphine PRN antiemetic therapy  Dispo: Admit   FINAL CLINICAL IMPRESSION(S) / ED DIAGNOSES    Final diagnoses:  NSTEMI (non-ST elevated myocardial infarction) (HCC)   Rx / DC Orders   ED Discharge Orders     None      Note:  This document was prepared using Dragon voice recognition software and may include unintentional dictation errors.   Merwyn Katos, MD 11/17/22 [id number]

## 2022-11-17 NOTE — Assessment & Plan Note (Signed)
Seasonal allergies OSA on CPAP Continue Pulmicort twice daily with Ventolin as needed Continue cetirizine

## 2022-11-17 NOTE — ED Triage Notes (Addendum)
Pt had blood drawn Monday went for a physical today and had troponin drawn. PCP called about 1 hour ago and said to come straight to the ER due to elevated troponin over 1200. Pt denies any pain or discomfort.

## 2022-11-17 NOTE — Progress Notes (Unsigned)
Subjective:  Patient ID: [name], female    DOB: [birth year]  Age: 77 y.o. MRN: [id number]  CC: {There were no encounter diagnoses. (Refresh or delete this SmartLink)}   HPI [name] presents for  Chief Complaint  Patient presents with   Medical Management of Chronic Issues    3 month follow up    Obesity , diabretes and hypertension :  she stopped metformin due to diarrhea.  She stopped rybelsus due to concerns about safety, (family history of goiter and pancreatic CA)   has lost 7 lbs since June .  Hoe BP readings are all < 130/80 with one low of 93/61.  Reports constant fatigue but no orthostasis.  BPS 118 to 146   Stress/anxiety:    3 days ago began having recurrent f SSCP that radiated to both both sides of jaw,  both shoulders, and left axilla followed by  under right breast  that spread to her  abdomen. No nausea .  Felt better with deep breathing. BP was 174/92.  BP machine suggested that her Heart rate was irregular for 2 days, then  all symptoms resolved. Has had increased indigestion,  burping and flatulence   H/o fatty liver last ultrasound sept 2023 .  AST elevated during recent labs 10/28 but alk phos normal   Last cardiac eval was in 2017 with a nuc med stress test (Hochrein)  OSA:  needs new CPAP  machine [facility] is out of business. Last sleep study was in May 2008. Done at [facility] .  Current setting at 10.4    Outpatient Medications Prior to Visit  Medication Sig Dispense Refill   albuterol (VENTOLIN HFA) 108 (90 Base) MCG/ACT inhaler Inhale 2 puffs into the lungs every 6 (six) hours as needed for wheezing or shortness of breath.     ALPRAZolam (XANAX) 0.25 MG tablet Take 1 tablet (0.25 mg total) by mouth 2 (two) times daily as needed for anxiety. 30 tablet 0   budesonide (PULMICORT) 180 MCG/ACT inhaler Inhale 2 puffs into the lungs 2 (two) times daily.     cetirizine (ZYRTEC) 10 MG tablet Take 10 mg by mouth daily.     EPINEPHrine 0.3 mg/0.3  mL IJ SOAJ injection See admin instructions.     LUMIGAN 0.01 % SOLN Place 1 drop into both eyes at bedtime.     meloxicam (MOBIC) 15 MG tablet TAKE 1 TABLET (15 MG TOTAL) BY MOUTH EVERY THREE (3) DAYS AS NEEDED FOR PAIN. 30 tablet 2   MILK THISTLE PO Take 1 capsule by mouth daily.      ondansetron (ZOFRAN) 4 MG tablet Take 1 tablet (4 mg total) by mouth every 8 (eight) hours as needed for nausea or vomiting. 30 tablet 02   Probiotic Product (PROBIOTIC COMPLEX ACIDOPHILUS PO) Take 1 capsule by mouth daily.     sodium chloride (OCEAN) 0.65 % SOLN nasal spray Place 1 spray into the nose as needed.     telmisartan (MICARDIS) 20 MG tablet Take 1 tablet (20 mg total) by mouth daily. 90 tablet 3   tildrakizumab-asmn (ILUMYA) 100 MG/ML subcutaneous injection Inject 100 mg into the skin every 3 (three) months.     triamcinolone cream (KENALOG) 0.1 % Apply 1 Application topically 2 (two) times daily. Until itching has resolved 80 g 2   fluconazole (DIFLUCAN) 150 MG tablet Take 1 tablet (150 mg total) by mouth daily. (Patient not taking: Reported on 11/17/2022) 2 tablet 0   metFORMIN (  GLUCOPHAGE-XR) 500 MG 24 hr tablet Take 1 tablet (500 mg total) by mouth daily after supper. (Patient not taking: Reported on 11/17/2022) 90 tablet 3   Semaglutide (RYBELSUS) 3 MG TABS Take 1 tablet (3 mg total) by mouth daily. (Patient not taking: Reported on 11/17/2022) 30 tablet 0   No facility-administered medications prior to visit.    Review of Systems;  Patient denies headache, fevers, malaise, unintentional weight loss, skin rash, eye pain, sinus congestion and sinus pain, sore throat, dysphagia,  hemoptysis , cough, dyspnea, wheezing, chest pain, palpitations, orthopnea, edema, abdominal pain, nausea, melena, diarrhea, constipation, flank pain, dysuria, hematuria, urinary  Frequency, nocturia, numbness, tingling, seizures,  Focal weakness, Loss of consciousness,  Tremor, insomnia, depression, anxiety, and suicidal  ideation.      Objective:  BP 136/78   Pulse (!) 102   Ht 5\' 6"  (1.676 m)   Wt 215 lb 1.9 oz (97.6 kg)   SpO2 96%   BMI 34.72 kg/m   BP Readings from Last 3 Encounters:  11/17/22 136/78  06/22/22 124/65  03/22/22 124/72    Wt Readings from Last 3 Encounters:  11/17/22 215 lb 1.9 oz (97.6 kg)  06/22/22 222 lb 3.2 oz (100.8 kg)  03/22/22 222 lb (100.7 kg)    Physical Exam  Lab Results  Component Value Date   HGBA1C 7.1 (H) 11/13/2022   HGBA1C 7.2 (H) 06/15/2022   HGBA1C 7.1 (H) 03/14/2022    Lab Results  Component Value Date   CREATININE 0.85 11/13/2022   CREATININE 0.88 06/15/2022   CREATININE 0.80 03/14/2022    Lab Results  Component Value Date   WBC 12.4 (H) 11/13/2022   HGB 14.1 11/13/2022   HCT 43.1 11/13/2022   PLT 351.0 11/13/2022   GLUCOSE 154 (H) 11/13/2022   CHOL 189 11/13/2022   TRIG 150.0 (H) 11/13/2022   HDL 51.40 11/13/2022   LDLDIRECT 127.0 11/13/2022   LDLCALC 108 (H) 11/13/2022   ALT 28 11/13/2022   AST 39 (H) 11/13/2022   NA 138 11/13/2022   K 4.4 11/13/2022   CL 101 11/13/2022   CREATININE 0.85 11/13/2022   BUN 12 11/13/2022   CO2 27 11/13/2022   TSH 3.43 11/13/2022   INR 1.1 07/27/2007   HGBA1C 7.1 (H) 11/13/2022   MICROALBUR <0.7 06/15/2022    No results found.  Assessment & Plan:  .There are no diagnoses linked to this encounter.   I provided 30 minutes of face-to-face time during this encounter reviewing patient's last visit with me, patient's  most recent visit with cardiology,  nephrology,  and neurology,  recent surgical and non surgical procedures, previous  labs and imaging studies, counseling on currently addressed issues,  and post visit ordering to diagnostics and therapeutics .   Follow-up: No follow-ups on file.   Sherlene Shams, MD

## 2022-11-18 ENCOUNTER — Encounter: Payer: Self-pay | Admitting: Osteopathic Medicine

## 2022-11-18 ENCOUNTER — Inpatient Hospital Stay (HOSPITAL_COMMUNITY)
Admit: 2022-11-18 | Discharge: 2022-11-18 | Disposition: A | Discharge status: Home / Self Care | Payer: Medicare Other | Attending: Internal Medicine

## 2022-11-18 DIAGNOSIS — I429 Cardiomyopathy, unspecified: Secondary | ICD-10-CM

## 2022-11-18 DIAGNOSIS — T466X5A Adverse effect of antihyperlipidemic and antiarteriosclerotic drugs, initial encounter: Secondary | ICD-10-CM

## 2022-11-18 DIAGNOSIS — I214 Non-ST elevation (NSTEMI) myocardial infarction: Secondary | ICD-10-CM | POA: Diagnosis not present

## 2022-11-18 DIAGNOSIS — M791 Myalgia, unspecified site: Secondary | ICD-10-CM

## 2022-11-18 DIAGNOSIS — E7800 Pure hypercholesterolemia, unspecified: Secondary | ICD-10-CM

## 2022-11-18 DIAGNOSIS — E1159 Type 2 diabetes mellitus with other circulatory complications: Secondary | ICD-10-CM | POA: Diagnosis not present

## 2022-11-18 DIAGNOSIS — E119 Type 2 diabetes mellitus without complications: Secondary | ICD-10-CM

## 2022-11-18 DIAGNOSIS — E669 Obesity, unspecified: Secondary | ICD-10-CM

## 2022-11-18 LAB — CBC
HCT: 38.8 % (ref 36.0–46.0)
Hemoglobin: 13.3 g/dL (ref 12.0–15.0)
MCH: 29.8 pg (ref 26.0–34.0)
MCHC: 34.3 g/dL (ref 30.0–36.0)
MCV: 87 fL (ref 80.0–100.0)
Platelets: 348 10*3/uL (ref 150–400)
RBC: 4.46 MIL/uL (ref 3.87–5.11)
RDW: 12.6 % (ref 11.5–15.5)
WBC: 12.8 10*3/uL — ABNORMAL HIGH (ref 4.0–10.5)
nRBC: 0 % (ref 0.0–0.2)

## 2022-11-18 LAB — ECHOCARDIOGRAM COMPLETE
AR max vel: 1.24 cm2
AV Area VTI: 1.07 cm2
AV Area mean vel: 1.19 cm2
AV Mean grad: 8.5 mm[Hg]
AV Peak grad: 15.1 mm[Hg]
Ao pk vel: 1.95 m/s
Area-P 1/2: 6.27 cm2
Height: 66 in
S' Lateral: 3.55 cm
Weight: 3441.92 [oz_av]

## 2022-11-18 LAB — HEPARIN LEVEL (UNFRACTIONATED)
Heparin Unfractionated: 0.18 [IU]/mL — ABNORMAL LOW (ref 0.30–0.70)
Heparin Unfractionated: 0.44 [IU]/mL (ref 0.30–0.70)
Heparin Unfractionated: 0.34 [IU]/mL (ref 0.30–0.70)

## 2022-11-18 LAB — CBG MONITORING, ED
Glucose-Capillary: 149 mg/dL — ABNORMAL HIGH (ref 70–99)
Glucose-Capillary: 133 mg/dL — ABNORMAL HIGH (ref 70–99)

## 2022-11-18 MED ORDER — BUDESONIDE 0.25 MG/2ML IN SUSP
0.2500 mg | Freq: Two times a day (BID) | RESPIRATORY_TRACT | Status: DC
  Filled 2022-11-18: qty 2

## 2022-11-18 MED ORDER — LATANOPROST 0.005 % OP SOLN
1.0000 [drp] | Freq: Every day | OPHTHALMIC | Status: DC
  Administered 2022-11-18: 1 [drp] via OPHTHALMIC
  Filled 2022-11-18 (×2): qty 2.5

## 2022-11-18 MED ORDER — HEPARIN BOLUS VIA INFUSION
2400.0000 [IU] | Freq: Once | INTRAVENOUS | Status: AC
  Administered 2022-11-18: 2400 [IU] via INTRAVENOUS
  Filled 2022-11-18: qty 2400

## 2022-11-18 MED ORDER — BUDESONIDE 180 MCG/ACT IN AEPB: 2.0000 | INHALATION_SPRAY | Freq: Two times a day (BID) | RESPIRATORY_TRACT | Status: DC

## 2022-11-18 MED ORDER — ALBUTEROL SULFATE (2.5 MG/3ML) 0.083% IN NEBU: 3.0000 mL | INHALATION_SOLUTION | Freq: Four times a day (QID) | RESPIRATORY_TRACT | Status: DC | PRN

## 2022-11-18 MED ORDER — ROSUVASTATIN CALCIUM 20 MG PO TABS
40.0000 mg | ORAL_TABLET | Freq: Every day | ORAL | Status: DC
  Filled 2022-11-18: qty 2

## 2022-11-18 MED ORDER — LORATADINE 10 MG PO TABS
10.0000 mg | ORAL_TABLET | Freq: Every day | ORAL | Status: DC
  Filled 2022-11-18: qty 1

## 2022-11-18 MED ORDER — PERFLUTREN LIPID MICROSPHERE
1.0000 mL | INTRAVENOUS | Status: AC | PRN
  Administered 2022-11-18: 4 mL via INTRAVENOUS

## 2022-11-18 NOTE — ED Notes (Signed)
Dr. Lyn Hollingshead aware of pt's insulin refusal.

## 2022-11-18 NOTE — Consult Note (Signed)
Cardiology Consultation:   Patient ID: [name]; [id number]; December 21, 1945   Admit date: 11/17/2022 Date of Consult: 11/18/2022  Primary Care Provider: Sherlene Shams, MD Primary Cardiologist: New - consult by Dr. Cristal Deer, MD Primary Electrophysiologist:  None   Patient Profile:   [name] is a 77 y.o. female with a hx of coronary artery calcification noted on CT imaging, DM2, HTN, HLD with intolerance to atorvastatin, fatty liver, asthma, obesity, and sleep apnea  who is being seen today for the evaluation of NSTEMI at the request of Dr. Para Fletcher.  History of Present Illness:   [name] was previously evaluated in our office in 2017 for chest discomfort.  Lexiscan MPI in 07/2015 showed no evidence of ischemia with an EF of 55 to 65% with thickened left ventricular wall, and was overall low risk.   She was seen at her PCP's office on 11/1 and reported a 3-day history (began on 10/27) of substernal chest pain rated to both sides of her jaw, both shoulders into the left upper extremity and down into her abdomen.  Pain was constant and not exacerbated by exertion.  Eating did make the pain worse.  No associated dyspnea, diaphoresis, nausea, or vomiting.  No symptoms leading up to the 3-day history of chest discomfort.  On 10/30, symptoms spontaneously resolved.  EKG in the office showed a sinus rhythm, 95 bpm, LVH, anterior infarct, poor R wave progression along the precordial leads, and nonspecific ST-T changes.  Outpatient troponin was drawn and elevated at 1228.  In this setting, she was advised to proceed to the ED.   Upon arrival to [facility] ED she was found to be hemodynamically stable with BP ranging from 1 34-1 56 systolic heart rates ranging from 89 to 100 bpm, oxygen saturation 94 to 96% on room air and was afebrile.  Repeat EKG showed sinus tachycardia, 102 bpm, prior septal infarct, and nonspecific inferolateral ST-T changes.  Repeat troponin of 1560 subsequently downtrending to  1483.  Chest x-ray showed cardiac enlargement with small bilateral pleural effusions.  CTA chest negative for PE with cardiac enlargement, coronary artery calcification, and mild dependent atelectasis.  She was started on a heparin drip.  Currently, notes some mild right sided and left axillary chest discomfort that is improved from what she previously experienced.  No palpitations, dizziness, presyncope, or syncope.    Past Medical History:  Diagnosis Date   Asthma    Asthma due to environmental allergies    grass, mold, trees, dust   Cataract    Cellulitis    LEFT LEG   Diabetes mellitus    CONTROLLED ON DIET ALONE   Edema leg    LEFT LEG...CHRONIC   Endometrial hyperplasia    External hemorrhoid    Fatty liver    Glaucoma    Hepatic cyst    STABLE PER 05/20/2008 ULTRASOUND   Hypertension    borderline...controlled since taking herself of HCTZ IN JULY   IBS (irritable bowel syndrome)    Murmur, cardiac    Neuropathy    OSA on CPAP    Osteoarthritis    Psoriasis    Sleep apnea    CPAP    Past Surgical History:  Procedure Laterality Date   BONE DENSITY TEST  01/17/2004   CATARACT EXTRACTION, BILATERAL Bilateral    May 2023 and June 2023   HYSTEROSCOPY WITH D & C  01/16/2005   BC OF ABNORAL PAP AND ENDOMETRIAL HYPERPLASIA   ONE VAGINAL DELIVERY  UMBILICAL HERNIA REPAIR  01/17/2008   VAGINAL HYSTERECTOMY  01/17/2008     Home Meds: Prior to Admission medications   Medication Sig Start Date End Date Taking? Authorizing Provider  albuterol (VENTOLIN HFA) 108 (90 Base) MCG/ACT inhaler Inhale 2 puffs into the lungs every 6 (six) hours as needed for wheezing or shortness of breath.    [provider]  ALPRAZolam Prudy Feeler) 0.25 MG tablet Take 1 tablet (0.25 mg total) by mouth 2 (two) times daily as needed for anxiety. 11/17/22   Maria Shams, MD  budesonide (PULMICORT) 180 MCG/ACT inhaler Inhale 2 puffs into the lungs 2 (two) times daily.    [provider]  cetirizine (ZYRTEC) 10 MG tablet Take 10 mg by mouth daily.    [provider]  EPINEPHrine 0.3 mg/0.3 mL IJ SOAJ injection See admin instructions. 07/23/18   [provider]  LUMIGAN 0.01 % SOLN Place 1 drop into both eyes at bedtime. 10/24/22   [provider]  meloxicam (MOBIC) 15 MG tablet TAKE 1 TABLET (15 MG TOTAL) BY MOUTH EVERY THREE (3) DAYS AS NEEDED FOR PAIN. 09/26/22   Maria Shams, MD  metoprolol succinate (TOPROL-XL) 25 MG 24 hr tablet Take 1 tablet (25 mg total) by mouth at bedtime. 11/17/22   Maria Shams, MD  MILK THISTLE PO Take 1 capsule by mouth daily.     [provider]  ondansetron (ZOFRAN) 4 MG tablet Take 1 tablet (4 mg total) by mouth every 8 (eight) hours as needed for nausea or vomiting. 09/21/21   Maria Shams, MD  Probiotic Product (PROBIOTIC COMPLEX ACIDOPHILUS PO) Take 1 capsule by mouth daily.    [provider]  sodium chloride (OCEAN) 0.65 % SOLN nasal spray Place 1 spray into the nose as needed.    [provider]  telmisartan (MICARDIS) 20 MG tablet Take 0.5 tablets (10 mg total) by mouth at bedtime. 11/17/22   Maria Shams, MD  tildrakizumab-asmn [facility]) 100 MG/ML subcutaneous injection Inject 100 mg into the skin every 3 (three) months.    [provider]  triamcinolone cream (KENALOG) 0.1 % Apply 1 Application topically 2 (two) times daily. Until itching has resolved 06/22/22   Maria Shams, MD    Inpatient Medications: Scheduled Meds:  aspirin EC  81 mg Oral Daily   insulin aspart  0-20 Units Subcutaneous Q4H   metoprolol succinate  25 mg Oral Daily   Continuous Infusions:  heparin 1,250 Units/hr (11/18/22 0414)   PRN Meds: acetaminophen, ALPRAZolam, nitroGLYCERIN, ondansetron (ZOFRAN) IV  Allergies:   Allergies  Allergen Reactions   Meloxicam Other (See Comments)    Hypertension, tachycardia   Sulfa Drugs Cross Reactors    Zostavax [employer] Vaccine Live]     Bad  reaction     Social History:   Social History   Socioeconomic History   Marital status: Married    Spouse name: Not on file   Number of children: 2   Years of education: Not on file   Highest education level: Not on file  Occupational History   Occupation: RETIRED  Tobacco Use   Smoking status: Former    Current packs/day: 0.00    Types: Cigarettes    Quit date: 01/16/1978    Years since quitting: 44.8   Smokeless tobacco: Never   Tobacco comments:    REMOTELY QUIT IN 1987 AFTER A FEW YEARS OF USE  Substance and Sexual Activity   Alcohol use: No  Drug use: No   Sexual activity: Not on file  Other Topics Concern   Not on file  Social History Narrative   Lives with husband.   Social Determinants of Health   Financial Resource Strain: Low Risk  (07/30/2018)   Overall Financial Resource Strain (CARDIA)    Difficulty of Paying Living Expenses: Not hard at all  Food Insecurity: No Food Insecurity (11/11/2021)   Hunger Vital Sign    Worried About Running Out of Food in the Last Year: Never true    Ran Out of Food in the Last Year: Never true  Transportation Needs: No Transportation Needs (11/11/2021)   PRAPARE - Administrator, Civil Service (Medical): No    Lack of Transportation (Non-Medical): No  Physical Activity: Unknown (07/30/2018)   Exercise Vital Sign    Days of Exercise per Week: 0 days    Minutes of Exercise per Session: Not on file  Stress: No Stress Concern Present (07/30/2018)   [employer] of Occupational Health - Occupational Stress Questionnaire    Feeling of Stress : Not at all  Social Connections: Not on file  Intimate Partner Violence: Not At Risk (07/30/2018)   Humiliation, Afraid, Rape, and Kick questionnaire    Fear of Current or Ex-Partner: No    Emotionally Abused: No    Physically Abused: No    Sexually Abused: No     Family History:   Family History  Problem Relation Age of Onset   Mental illness Mother        DEMENTIA    Heart disease Father 27       MI.Marland KitchenMarland KitchenS/D AVR/CABG x3   Cancer Neg Hx        no ovarian colon breast   Breast cancer Neg Hx     ROS:  Review of Systems  Constitutional:  Negative for chills, diaphoresis, fever, malaise/fatigue and weight loss.  HENT:  Negative for congestion.   Eyes:  Negative for discharge and redness.  Respiratory:  Negative for cough, sputum production, shortness of breath and wheezing.   Cardiovascular:  Positive for chest pain. Negative for palpitations, orthopnea, claudication, leg swelling and PND.  Gastrointestinal:  Negative for abdominal pain, heartburn, nausea and vomiting.  Musculoskeletal:  Negative for falls and myalgias.  Skin:  Negative for rash.  Neurological:  Negative for dizziness, tingling, tremors, sensory change, speech change, focal weakness, loss of consciousness and weakness.  Endo/Heme/Allergies:  Does not bruise/bleed easily.  Psychiatric/Behavioral:  Negative for substance abuse. The patient is not nervous/anxious.   All other systems reviewed and are negative.     Physical Exam/Data:   Vitals:   11/18/22 0143 11/18/22 0330 11/18/22 0600 11/18/22 0604  BP: (!) 156/87   (!) 151/92  Pulse: 92  90 89  Resp: 18  18 (!) 22  Temp:  97.8 F (36.6 C)    TempSrc:  Oral    SpO2: 100%  99% 94%    Intake/Output Summary (Last 24 hours) at 11/18/2022 0720 Last data filed at 11/18/2022 4098 Gross per 24 hour  Intake 130.24 ml  Output --  Net 130.24 ml   There were no vitals filed for this visit. There is no height or weight on file to calculate BMI.   Physical Exam: General: Well developed, well nourished, in no acute distress. Head: Normocephalic, atraumatic, sclera non-icteric, no xanthomas, nares without discharge.  Neck: Negative for carotid bruits. JVD not elevated. Lungs: Clear bilaterally to auscultation without wheezes, rales, or rhonchi. Breathing  is unlabored. Heart: RRR with S1 S2. I/VI systolic murmur RUSB, no rubs, or  gallops appreciated. Abdomen: Soft, non-tender, non-distended with normoactive bowel sounds. No hepatomegaly. No rebound/guarding. No obvious abdominal masses. Msk:  Strength and tone appear normal for age. Extremities: No clubbing or cyanosis. No edema. Distal pedal pulses are 2+ and equal bilaterally. Neuro: Alert and oriented X 3. No facial asymmetry. No focal deficit. Moves all extremities spontaneously. Psych:  Responds to questions appropriately with a normal affect.   EKG:  The EKG was personally reviewed and demonstrates: sinus tachycardia, 102 bpm, prior septal infarct, and nonspecific inferolateral ST-T changes Telemetry:  Telemetry was personally reviewed and demonstrates: Sinus rhythm with occasional isolated PVCs, rare ventricular couplet, 9 beat run of atrial tachycardia  Weights: There were no vitals filed for this visit.  Relevant CV Studies:  Lexiscan MPI 08/11/2015: The left ventricular ejection fraction is normal (55-65%). Nuclear stress EF: 63%. No wall motion abnormalities. Left ventricular wall appears thickened suggestive of hypertrophy There was no ST segment deviation noted during stress. This is a low risk study. No ischemia identified.  Laboratory Data:  Chemistry Recent Labs  Lab 11/13/22 0845 11/17/22 1002 11/17/22 1646  NA 138 137 136  K 4.4 4.3 3.9  CL 101 103 103  CO2 27 23 24   GLUCOSE 154* 139* 143*  BUN 12 14 17   CREATININE 0.85 0.76 0.81  CALCIUM 9.2 9.1 8.6*  GFRNONAA  --   --  >60  ANIONGAP  --   --  9    Recent Labs  Lab 11/13/22 0845 11/17/22 1002  PROT 6.9 7.1  ALBUMIN 4.0 3.9  AST 39* 26  ALT 28 20  ALKPHOS 78 76  BILITOT 1.1 1.5*   Hematology Recent Labs  Lab 11/13/22 0845 11/17/22 1812 11/18/22 0301  WBC 12.4* 16.3* 12.8*  RBC 4.91 4.54 4.46  HGB 14.1 13.4 13.3  HCT 43.1 40.1 38.8  MCV 87.9 88.3 87.0  MCH  --  29.5 29.8  MCHC 32.6 33.4 34.3  RDW 13.8 12.7 12.6  PLT 351.0 359 348   Cardiac EnzymesNo results  for input(s): "TROPONINI" in the last 168 hours. No results for input(s): "TROPIPOC" in the last 168 hours.  BNPNo results for input(s): "BNP", "PROBNP" in the last 168 hours.  DDimer No results for input(s): "DDIMER" in the last 168 hours.  Radiology/Studies:  CT Angio Chest PE W/Cm &/Or Wo Cm  Result Date: 11/17/2022 IMPRESSION: 1. No evidence of significant pulmonary embolus. 2. Cardiac enlargement with prominent coronary artery calcifications. 3. Mild dependent atelectasis in the lungs. No evidence of active pulmonary disease. Electronically Signed   By: Burman Nieves M.D.   On: 11/17/2022 18:44   DG Chest 2 View  Result Date: 11/17/2022 IMPRESSION: 1. Cardiac enlargement. 2. Small bilateral pleural effusions. 3. Lungs are otherwise clear. Electronically Signed   By: Burman Nieves M.D.   On: 11/17/2022 18:41    Assessment and Plan:   1. NSTEMI: -Without symptoms of angina or cardiac decompensation -Troponin has peaked at 1560 and is currently down trending -ASA 81 mg -Heparin drip -Echo pending -Plan for LHC on 11/20/2022 (0730 case) -N.p.o. at midnight on 11/4 -If she has recurrent angina that is unable to be controlled with antianginal escalation would pursue cardiac cath before 11/4 -She notes intolerance to atorvastatin secondary to myalgias, undergo a trial of rosuvastatin -Metoprolol succinate 25 mg  2.  HTN: -Blood pressure is mildly elevated in the ED, likely in the setting  of increased stress -Metoprolol succinate 25 mg -Escalate antihypertensive therapy as needed  3.  HLD: -Direct LDL 127 on 11/13/2022 with goal LDL now less than 55 -Intolerant to atorvastatin secondary to myalgias -Trial of rosuvastatin 40 mg -If she is intolerant to rosuvastatin would need to pursue PCSK9 inhibitor  4.  DM2: -A1c 7.1 on 11/13/2022 -Not on metformin PTA secondary to intolerance -SSI per IM  5.  Obesity with OSA: -Weight loss encouraged through heart healthy diet and  regular exercise -Continue PTA CPAP   Informed Consent   Shared Decision Making/Informed Consent{  The risks [stroke (1 in 1000), death (1 in 1000), kidney failure [usually temporary] (1 in 500), bleeding (1 in 200), allergic reaction [possibly serious] (1 in 200)], benefits (diagnostic support and management of coronary artery disease) and alternatives of a cardiac catheterization were discussed in detail with Ms. Demarinis and she is willing to proceed.       For questions or updates, please contact [facility] Please consult [email or web address] for contact info under Cardiology/STEMI.   Signed, Eula Listen, PA-C [facility] Pager: [phone] 11/18/2022, 7:20 AM

## 2022-11-18 NOTE — Assessment & Plan Note (Signed)
SHE has not had a sleep study since diagnosis in 2008  and needs a new machine.  Advised her to have a home study over 2 nights

## 2022-11-18 NOTE — Progress Notes (Signed)
PHARMACY - ANTICOAGULATION CONSULT NOTE  Pharmacy Consult for Heparin Infusion Indication: chest pain/ACS  Allergies  Allergen Reactions   Meloxicam Other (See Comments)    Hypertension, tachycardia   Statins Other (See Comments)    "Muscle weakness"   Sulfa Drugs Cross Reactors    Zostavax [Zoster Vaccine Live]     Bad reaction     Patient Measurements:   Heparin Dosing Weight: 81.2 kg  Vital Signs: Temp: 97.8 F (36.6 C) (11/02 0330) Temp Source: Oral (11/02 0330) BP: 100/77 (11/02 1200) Pulse Rate: 88 (11/02 1200)  Labs: Recent Labs    11/17/22 1002 11/17/22 1646 11/17/22 1812 11/18/22 0301 11/18/22 1214  HGB  --   --  13.4 13.3  --   HCT  --   --  40.1 38.8  --   PLT  --   --  359 348  --   APTT  --   --  25  --   --   LABPROT  --   --  14.3  --   --   INR  --   --  1.1  --   --   HEPARINUNFRC  --   --   --  0.18* 0.44  CREATININE 0.76 0.81  --   --   --   CKTOTAL 118  --   --   --   --   CKMB 3.0  --   --   --   --   TROPONINIHS  --  1,560* 1,483*  --   --     Estimated Creatinine Clearance: 67.4 mL/min (by C-G formula based on SCr of 0.81 mg/dL).   Medical History: Past Medical History:  Diagnosis Date   Asthma    Asthma due to environmental allergies    grass, mold, trees, dust   Cataract    Cellulitis    LEFT LEG   Diabetes mellitus    CONTROLLED ON DIET ALONE   Edema leg    LEFT LEG...CHRONIC   Endometrial hyperplasia    External hemorrhoid    Fatty liver    Glaucoma    Hepatic cyst    STABLE PER 05/20/2008 ULTRASOUND   Hypertension    borderline...controlled since taking herself of HCTZ IN JULY   IBS (irritable bowel syndrome)    Murmur, cardiac    Neuropathy    OSA on CPAP    Osteoarthritis    Psoriasis    Sleep apnea    CPAP   Assessment: Patient is a 77yo female with elevated troponin. Pharmacy consulted for Heparin dosing.   Goal of Therapy:  Heparin level 0.3-0.7 units/ml Monitor platelets by anticoagulation  protocol: Yes   11/02 0301 HL 0.18, subtherapeutic 11/02  1214 HL 0.44, therapeutic x 1   Plan:  Heparin therapeutic  continue heparin infusion at 1250 units/hr Recheck HL in 8 hr to confirm CBC daily while on heparin  Sharen Hones, PharmD, BCPS Clinical Pharmacist   11/18/2022 12:48 PM

## 2022-11-18 NOTE — Assessment & Plan Note (Signed)
Recent episode of stuttering chest pain suspicious for angina .  She is pain free currently.  I have ordered and reviewed a 12 lead EKG and find that there are no acute changes (compared to 2017) and patient is in sinus rhythm.    Stat cardiac enzymes ordered .  I am reducing her telmisartan in order to add metoprolol.  Cardiology referral made , per patient preference,  to dr hochrein (prior  noninasive workup in 2017 )  If enzymes are negative will repeat abd ultrasound.

## 2022-11-18 NOTE — Progress Notes (Signed)
*  PRELIMINARY RESULTS* Echocardiogram 2D Echocardiogram has been performed.  [name] 11/18/2022, 8:19 AM

## 2022-11-18 NOTE — ED Notes (Signed)
Pt is refusing CBG checks and insulin. This RN explained insulin is a protocol standard of monitoring and regulating blood sugars while at hospital even if pt does not take insulin at home and does not necessarily mean pt will go home on insulin.

## 2022-11-18 NOTE — ED Notes (Signed)
ED TO INPATIENT HANDOFF REPORT  ED Nurse Name and Phone #: Thurmon Fair. Manson Passey [phone]  S Name/Age/Gender [name] 77 y.o. female Room/Bed: ED32A/ED32A  Code Status   Code Status: Full Code  Home/SNF/Other Home Patient oriented to: self, place, time, and situation Is this baseline? Yes   Triage Complete: Triage complete  Chief Complaint NSTEMI (non-ST elevated myocardial infarction) [facility]) [I21.4]  Triage Note Pt had blood drawn Monday went for a physical today and had troponin drawn. PCP called about 1 hour ago and said to come straight to the ER due to elevated troponin over 1200. Pt denies any pain or discomfort.    Allergies Allergies  Allergen Reactions   Meloxicam Other (See Comments)    Hypertension, tachycardia   Statins Other (See Comments)    "Muscle weakness"   Sulfa Drugs Cross Reactors    Zostavax [Zoster Vaccine Live]     Bad reaction     Level of Care/Admitting Diagnosis ED Disposition     ED Disposition  Admit   Condition  --   Comment  Hospital Area: [facility] [[id number]]  Level of Care: Telemetry Cardiac [[id number]]  Covid Evaluation: Asymptomatic - no recent exposure (last 10 days) testing not required  Diagnosis: NSTEMI (non-ST elevated myocardial infarction) [facility]) [[id number]]  Admitting Physician: Sunnie Nielsen [[id number]]  Attending Physician: Sunnie Nielsen [phone]  Certification:: I certify this patient will need inpatient services for at least 2 midnights          B Medical/Surgery History Past Medical History:  Diagnosis Date   Asthma    Asthma due to environmental allergies    grass, mold, trees, dust   Cataract    Cellulitis    LEFT LEG   Diabetes mellitus    CONTROLLED ON DIET ALONE   Edema leg    LEFT LEG...CHRONIC   Endometrial hyperplasia    External hemorrhoid    Fatty liver    Glaucoma    Hepatic cyst    STABLE PER 05/20/2008 ULTRASOUND   Hypertension    borderline...controlled since  taking herself of HCTZ IN JULY   IBS (irritable bowel syndrome)    Murmur, cardiac    Neuropathy    OSA on CPAP    Osteoarthritis    Psoriasis    Sleep apnea    CPAP   Past Surgical History:  Procedure Laterality Date   BONE DENSITY TEST  01/17/2004   CATARACT EXTRACTION, BILATERAL Bilateral    May 2023 and June 2023   HYSTEROSCOPY WITH D & C  01/16/2005   BC OF ABNORAL PAP AND ENDOMETRIAL HYPERPLASIA   ONE VAGINAL DELIVERY     UMBILICAL HERNIA REPAIR  01/17/2008   VAGINAL HYSTERECTOMY  01/17/2008     A IV Location/Drains/Wounds Patient Lines/Drains/Airways Status     Active Line/Drains/Airways     Name Placement date Placement time Site Days   Peripheral IV 11/17/22 20 G 1" Right Antecubital 11/17/22  1648  Antecubital  1            Intake/Output Last 24 hours  Intake/Output Summary (Last 24 hours) at 11/18/2022 1547 Last data filed at 11/18/2022 1221 Gross per 24 hour  Intake 259.81 ml  Output --  Net 259.81 ml    Labs/Imaging Results for orders placed or performed during the hospital encounter of 11/17/22 (from the past 48 hour(s))  Basic metabolic panel     Status: Abnormal   Collection Time: 11/17/22  4:46 PM  Result Value  Ref Range   Sodium 136 135 - 145 mmol/L   Potassium 3.9 3.5 - 5.1 mmol/L   Chloride 103 98 - 111 mmol/L   CO2 24 22 - 32 mmol/L   Glucose, Bld 143 (H) 70 - 99 mg/dL    Comment: Glucose reference range applies only to samples taken after fasting for at least 8 hours.   BUN 17 8 - 23 mg/dL   Creatinine, Ser 1.61 0.44 - 1.00 mg/dL   Calcium 8.6 (L) 8.9 - 10.3 mg/dL   GFR, Estimated >09 >60 mL/min    Comment: (NOTE) Calculated using the CKD-EPI Creatinine Equation (2021)    Anion gap 9 5 - 15    Comment: Performed at [facility], [address] [place], [place] [address]  Troponin I (High Sensitivity)     Status: Abnormal   Collection Time: 11/17/22  4:46 PM  Result Value Ref Range   Troponin I (High Sensitivity)  1,560 (HH) <18 ng/L    Comment: CRITICAL RESULT CALLED TO, READ BACK BY AND VERIFIED WITH JES SAVAGE @1722  11/17/22 MJU (NOTE) Elevated high sensitivity troponin I (hsTnI) values and significant  changes across serial measurements may suggest ACS but many other  chronic and acute conditions are known to elevate hsTnI results.  Refer to the "Links" section for chest pain algorithms and additional  guidance. Performed at [facility], [address] [place], [place] [address]   CBC     Status: Abnormal   Collection Time: 11/17/22  6:12 PM  Result Value Ref Range   WBC 16.3 (H) 4.0 - 10.5 K/uL   RBC 4.54 3.87 - 5.11 MIL/uL   Hemoglobin 13.4 12.0 - 15.0 g/dL   HCT 47.8 29.5 - 62.1 %   MCV 88.3 80.0 - 100.0 fL   MCH 29.5 26.0 - 34.0 pg   MCHC 33.4 30.0 - 36.0 g/dL   RDW 30.8 65.7 - 84.6 %   Platelets 359 150 - 400 K/uL   nRBC 0.0 0.0 - 0.2 %    Comment: Performed at [facility], [address] [place], [place] [address]  Troponin I (High Sensitivity)     Status: Abnormal   Collection Time: 11/17/22  6:12 PM  Result Value Ref Range   Troponin I (High Sensitivity) 1,483 (HH) <18 ng/L    Comment: CRITICAL VALUE NOTED. VALUE IS CONSISTENT WITH PREVIOUSLY REPORTED/CALLED VALUE LFD (NOTE) Elevated high sensitivity troponin I (hsTnI) values and significant  changes across serial measurements may suggest ACS but many other  chronic and acute conditions are known to elevate hsTnI results.  Refer to the "Links" section for chest pain algorithms and additional  guidance. Performed at [facility], [address] [place], [place] [address]   APTT     Status: None   Collection Time: 11/17/22  6:12 PM  Result Value Ref Range   aPTT 25 24 - 36 seconds    Comment: Performed at [facility], [address] [place], [place] [address]  Protime-INR     Status: None   Collection Time: 11/17/22  6:12 PM  Result Value Ref Range   Prothrombin Time 14.3  11.4 - 15.2 seconds   INR 1.1 0.8 - 1.2    Comment: (NOTE) INR goal varies based on device and disease states. Performed at [facility], [address] [place], [place] [address]   CBG monitoring, ED     Status: Abnormal   Collection Time: 11/17/22  8:12 PM  Result Value Ref Range   Glucose-Capillary 133 (H) 70 - 99 mg/dL    Comment: Glucose reference range applies only to samples taken after fasting for at least 8 hours.  CBG monitoring, ED     Status: Abnormal   Collection Time: 11/17/22 11:04 PM  Result Value Ref Range   Glucose-Capillary 182 (H) 70 - 99 mg/dL    Comment: Glucose reference range applies only to samples taken after fasting for at least 8 hours.  Heparin level (unfractionated)     Status: Abnormal   Collection Time: 11/18/22  3:01 AM  Result Value Ref Range   Heparin Unfractionated 0.18 (L) 0.30 - 0.70 IU/mL    Comment: (NOTE) The clinical reportable range upper limit is being lowered to >1.10 to align with the FDA approved guidance for the current laboratory assay.  If heparin results are below expected values, and patient dosage has  been confirmed, suggest follow up testing of antithrombin III levels. Performed at [facility], [address] [place], [place] [address]   CBC     Status: Abnormal   Collection Time: 11/18/22  3:01 AM  Result Value Ref Range   WBC 12.8 (H) 4.0 - 10.5 K/uL   RBC 4.46 3.87 - 5.11 MIL/uL   Hemoglobin 13.3 12.0 - 15.0 g/dL   HCT 60.4 54.0 - 98.1 %   MCV 87.0 80.0 - 100.0 fL   MCH 29.8 26.0 - 34.0 pg   MCHC 34.3 30.0 - 36.0 g/dL   RDW 19.1 47.8 - 29.5 %   Platelets 348 150 - 400 K/uL   nRBC 0.0 0.0 - 0.2 %    Comment: Performed at [facility], [address] [place], [place] [address]  CBG monitoring, ED     Status: Abnormal   Collection Time: 11/18/22  4:20 AM  Result Value Ref Range   Glucose-Capillary 149 (H) 70 - 99 mg/dL    Comment: Glucose reference range applies only to samples  taken after fasting for at least 8 hours.  CBG monitoring, ED     Status: Abnormal   Collection Time: 11/18/22 11:52 AM  Result Value Ref Range   Glucose-Capillary 133 (H) 70 - 99 mg/dL    Comment: Glucose reference range applies only to samples taken after fasting for at least 8 hours.   Comment 1 Notify RN   Heparin level (unfractionated)     Status: None   Collection Time: 11/18/22 12:14 PM  Result Value Ref Range   Heparin Unfractionated 0.44 0.30 - 0.70 IU/mL    Comment: (NOTE) The clinical reportable range upper limit is being lowered to >1.10 to align with the FDA approved guidance for the current laboratory assay.  If heparin results are below expected values, and patient dosage has  been confirmed, suggest follow up testing of antithrombin III levels. Performed at [facility], [address] [place], [place] [address]    ECHOCARDIOGRAM COMPLETE  Result Date: 11/18/2022    ECHOCARDIOGRAM REPORT   Patient Name:   [name] Date of Exam: 11/18/2022 Medical Rec #:  [id number]     Height:       66.0 in Accession #:    [id number]    Weight:       215.1 lb Date of Birth:  Jun 18, 1945      BSA:          2.063 m Patient Age:    77 years      BP:  156/92 mmHg Patient Gender: F             HR:           94 bpm. Exam Location:  [facility] Procedure: 2D Echo, Cardiac Doppler, Color Doppler and Intracardiac            Opacification Agent Indications:    NSTEMI  History:        Patient has no prior history of Echocardiogram examinations.                 PAD, Signs/Symptoms:Chest Pain; Risk Factors:Hypertension,                 Diabetes, Dyslipidemia, Former Smoker and Sleep Apnea.  Sonographer:    Dondra Prader RVT RCS Referring Phys: [id number] Andris Baumann  Sonographer Comments: Technically difficult study due to poor echo windows, suboptimal parasternal window, suboptimal apical window, suboptimal subcostal window and patient is obese. Image acquisition challenging due to patient  body habitus. IMPRESSIONS  1. Left ventricular ejection fraction, by estimation, is 35 to 40%. The left ventricle has moderately decreased function. The left ventricle demonstrates regional wall motion abnormalities (see scoring diagram/findings for description). There is mild concentric left ventricular hypertrophy. Left ventricular diastolic parameters are indeterminate. There is mild hypokinesis of the left ventricular, entire inferoseptal wall and inferior wall. There is moderate hypokinesis of the left ventricular, entire  anterolateral wall, inferolateral wall and anterior wall.  2. Right ventricular systolic function is normal. The right ventricular size is not well visualized. There is normal pulmonary artery systolic pressure.  3. The mitral valve is normal in structure. Mild mitral valve regurgitation. No evidence of mitral stenosis.  4. The aortic valve was not well visualized. There is moderate calcification of the aortic valve. There is mild thickening of the aortic valve. Aortic valve regurgitation is not visualized. Mild aortic valve stenosis.  5. The inferior vena cava is normal in size with greater than 50% respiratory variability, suggesting right atrial pressure of 3 mmHg. Comparison(s): No prior Echocardiogram. Conclusion(s)/Recommendation(s): Very difficult to see wall motion, even with use of echo contrast. Moderately reduced LVEF, anterior wall not well visualized, in contrast images lateral wall appears slightly more hypokinetic than other walls, but images  are poor sensitivity for wall motion. FINDINGS  Left Ventricle: Left ventricular ejection fraction, by estimation, is 35 to 40%. The left ventricle has moderately decreased function. The left ventricle demonstrates regional wall motion abnormalities. Mild hypokinesis of the left ventricular, entire inferoseptal wall and inferior wall. Moderate hypokinesis of the left ventricular, entire anterolateral wall, inferolateral wall and anterior  wall. Definity contrast agent was given IV to delineate the left ventricular endocardial borders. The left ventricular internal cavity size was normal in size. There is mild concentric left ventricular hypertrophy. Left ventricular diastolic parameters are indeterminate. Right Ventricle: The right ventricular size is not well visualized. Right vetricular wall thickness was not well visualized. Right ventricular systolic function is normal. There is normal pulmonary artery systolic pressure. The tricuspid regurgitant velocity is 1.86 m/s, and with an assumed right atrial pressure of 3 mmHg, the estimated right ventricular systolic pressure is 16.8 mmHg. Left Atrium: Left atrial size was normal in size. Right Atrium: Right atrial size was not well visualized. Pericardium: There is no evidence of pericardial effusion. Mitral Valve: The mitral valve is normal in structure. Mild mitral valve regurgitation. No evidence of mitral valve stenosis. Tricuspid Valve: The tricuspid valve is normal in structure. Tricuspid valve regurgitation is trivial.  No evidence of tricuspid stenosis. Aortic Valve: The aortic valve was not well visualized. There is moderate calcification of the aortic valve. There is mild thickening of the aortic valve. Aortic valve regurgitation is not visualized. Mild aortic stenosis is present. Aortic valve mean gradient measures 8.5 mmHg. Aortic valve peak gradient measures 15.1 mmHg. Aortic valve area, by VTI measures 1.07 cm. Pulmonic Valve: The pulmonic valve was not well visualized. Pulmonic valve regurgitation is not visualized. No evidence of pulmonic stenosis. Aorta: The aortic root, ascending aorta, aortic arch and descending aorta are all structurally normal, with no evidence of dilitation or obstruction. Venous: The inferior vena cava is normal in size with greater than 50% respiratory variability, suggesting right atrial pressure of 3 mmHg. IAS/Shunts: The interatrial septum was not well  visualized.  LEFT VENTRICLE PLAX 2D LVIDd:         4.75 cm   Diastology LVIDs:         3.55 cm   LV e' medial:    6.85 cm/s LV PW:         1.25 cm   LV E/e' medial:  16.1 LV IVS:        1.15 cm   LV e' lateral:   6.53 cm/s LVOT diam:     1.90 cm   LV E/e' lateral: 16.8 LV SV:         35 LV SV Index:   17 LVOT Area:     2.84 cm  RIGHT VENTRICLE             IVC RV S prime:     12.10 cm/s  IVC diam: 1.60 cm TAPSE (M-mode): 2.0 cm LEFT ATRIUM             Index LA diam:        2.80 cm 1.36 cm/m LA Vol (A2C):   43.1 ml 20.89 ml/m LA Vol (A4C):   55.4 ml 26.86 ml/m LA Biplane Vol: 50.6 ml 24.53 ml/m  AORTIC VALVE                     PULMONIC VALVE AV Area (Vmax):    1.24 cm      PV Vmax:       1.24 m/s AV Area (Vmean):   1.19 cm      PV Peak grad:  6.2 mmHg AV Area (VTI):     1.07 cm AV Vmax:           194.50 cm/s AV Vmean:          135.500 cm/s AV VTI:            0.324 m AV Peak Grad:      15.1 mmHg AV Mean Grad:      8.5 mmHg LVOT Vmax:         85.00 cm/s LVOT Vmean:        56.900 cm/s LVOT VTI:          0.123 m LVOT/AV VTI ratio: 0.38  AORTA Ao Root diam: 2.30 cm Ao Asc diam:  2.80 cm MITRAL VALVE                TRICUSPID VALVE MV Area (PHT): 6.27 cm     TR Peak grad:   13.8 mmHg MV Decel Time: 121 msec     TR Vmax:        186.00 cm/s MV E velocity: 110.00 cm/s MV A velocity: 103.00 cm/s  SHUNTS MV E/A  ratio:  1.07         Systemic VTI:  0.12 m                             Systemic Diam: 1.90 cm Jodelle Red MD Electronically signed by Jodelle Red MD Signature Date/Time: 11/18/2022/12:16:38 PM    Final    CT Angio Chest PE W/Cm &/Or Wo Cm  Result Date: 11/17/2022 CLINICAL DATA:  Pulmonary embolus suspected with high probability. Elevated troponin levels. EXAM: CT ANGIOGRAPHY CHEST WITH CONTRAST TECHNIQUE: Multidetector CT imaging of the chest was performed using the standard protocol during bolus administration of intravenous contrast. Multiplanar CT image reconstructions and MIPs were  obtained to evaluate the vascular anatomy. RADIATION DOSE REDUCTION: This exam was performed according to the departmental dose-optimization program which includes automated exposure control, adjustment of the mA and/or kV according to patient size and/or use of iterative reconstruction technique. CONTRAST:  65mL OMNIPAQUE IOHEXOL 350 MG/ML SOLN COMPARISON:  Chest radiograph 11/17/2022 FINDINGS: Cardiovascular: Technically adequate study with good opacification of the central and segmental pulmonary arteries. Moderate motion artifact. No focal filling defects. No evidence of significant pulmonary embolus. Mild cardiac enlargement. No pericardial effusions. Normal caliber thoracic aorta. Few scattered calcifications in the aorta. Prominent coronary artery calcification. Mediastinum/Nodes: No enlarged mediastinal, hilar, or axillary lymph nodes. Thyroid gland, trachea, and esophagus demonstrate no significant findings. Lungs/Pleura: Mild dependent atelectasis in the lung bases. No airspace disease or consolidation. No pleural effusions. No pneumothorax. Upper Abdomen: No acute abnormalities. Musculoskeletal: Degenerative changes in the spine. No acute bony abnormalities. Review of the MIP images confirms the above findings. IMPRESSION: 1. No evidence of significant pulmonary embolus. 2. Cardiac enlargement with prominent coronary artery calcifications. 3. Mild dependent atelectasis in the lungs. No evidence of active pulmonary disease. Electronically Signed   By: Burman Nieves M.D.   On: 11/17/2022 18:44   DG Chest 2 View  Result Date: 11/17/2022 CLINICAL DATA:  Elevated troponin EXAM: CHEST - 2 VIEW COMPARISON:  03/05/2018 FINDINGS: Mild cardiac enlargement. No vascular congestion, edema, or consolidation. Blunting of the costophrenic angles suggests small bilateral pleural effusions. No pneumothorax. Mediastinal contours appear intact. Degenerative changes in the spine. IMPRESSION: 1. Cardiac enlargement. 2.  Small bilateral pleural effusions. 3. Lungs are otherwise clear. Electronically Signed   By: Burman Nieves M.D.   On: 11/17/2022 18:41    Pending Labs Unresulted Labs (From admission, onward)     Start     Ordered   11/19/22 0500  CBC  Tomorrow morning,   R        11/18/22 1249   11/18/22 2000  Heparin level (unfractionated)  Once-Timed,   TIMED        11/18/22 1249   11/18/22 0500  Lipoprotein A (LPA)  Tomorrow morning,   R        11/17/22 1941            Vitals/Pain Today's Vitals   11/18/22 1020 11/18/22 1036 11/18/22 1200 11/18/22 1544  BP: (!) 142/78 (!) 142/78 100/77 (!) 141/66  Pulse: 89 89 88 95  Resp: 20  19 18   Temp:    97.6 F (36.4 C)  TempSrc:    Oral  SpO2: 96%  96% 97%  PainSc:        Isolation Precautions No active isolations  Medications Medications  heparin ADULT infusion 100 units/mL (25000 units/265mL) (1,250 Units/hr Intravenous Infusion Verify 11/18/22 1221)  aspirin EC tablet  81 mg (81 mg Oral Given 11/18/22 1036)  nitroGLYCERIN (NITROSTAT) SL tablet 0.4 mg (has no administration in time range)  acetaminophen (TYLENOL) tablet 650 mg (has no administration in time range)  ondansetron (ZOFRAN) injection 4 mg (has no administration in time range)  ALPRAZolam (XANAX) tablet 0.25 mg (has no administration in time range)  metoprolol succinate (TOPROL-XL) 24 hr tablet 25 mg (25 mg Oral Given 11/18/22 1036)  latanoprost (XALATAN) 0.005 % ophthalmic solution 1 drop (has no administration in time range)  albuterol (PROVENTIL) (2.5 MG/3ML) 0.083% nebulizer solution 3 mL (has no administration in time range)  heparin bolus via infusion 4,000 Units (4,000 Units Intravenous Bolus from Bag 11/17/22 1827)  iohexol (OMNIPAQUE) 350 MG/ML injection 75 mL (65 mLs Intravenous Contrast Given 11/17/22 1739)  heparin bolus via infusion 2,400 Units (2,400 Units Intravenous Bolus from Bag 11/18/22 0415)    Mobility walks     Focused Assessments Cardiac Assessment  Handoff:  Cardiac Rhythm: Normal sinus rhythm Lab Results  Component Value Date   CKTOTAL 118 11/17/2022   CKMB 3.0 11/17/2022   No results found for: "DDIMER" Does the Patient currently have chest pain? No    R Recommendations: See Admitting Provider Note  Report given to:   Additional Notes:

## 2022-11-18 NOTE — Assessment & Plan Note (Signed)
Sliding scale insulin coverage 

## 2022-11-18 NOTE — Progress Notes (Signed)
PHARMACY - ANTICOAGULATION CONSULT NOTE  Pharmacy Consult for Heparin Infusion Indication: chest pain/ACS  Allergies  Allergen Reactions   Meloxicam Other (See Comments)    Hypertension, tachycardia   Sulfa Drugs Cross Reactors    Zostavax [Zoster Vaccine Live]     Bad reaction     Patient Measurements:   Heparin Dosing Weight: 81.2 kg  Vital Signs: Temp: 97.8 F (36.6 C) (11/02 0330) Temp Source: Oral (11/02 0330) BP: 156/87 (11/02 0143) Pulse Rate: 92 (11/02 0143)  Labs: Recent Labs    11/17/22 1002 11/17/22 1646 11/17/22 1812 11/18/22 0301  HGB  --   --  13.4 13.3  HCT  --   --  40.1 38.8  PLT  --   --  359 348  APTT  --   --  25  --   LABPROT  --   --  14.3  --   INR  --   --  1.1  --   HEPARINUNFRC  --   --   --  0.18*  CREATININE 0.76 0.81  --   --   CKTOTAL 118  --   --   --   CKMB 3.0  --   --   --   TROPONINIHS  --  1,560* 1,483*  --     Estimated Creatinine Clearance: 67.4 mL/min (by C-G formula based on SCr of 0.81 mg/dL).   Medical History: Past Medical History:  Diagnosis Date   Asthma    Asthma due to environmental allergies    grass, mold, trees, dust   Cataract    Cellulitis    LEFT LEG   Diabetes mellitus    CONTROLLED ON DIET ALONE   Edema leg    LEFT LEG...CHRONIC   Endometrial hyperplasia    External hemorrhoid    Fatty liver    Glaucoma    Hepatic cyst    STABLE PER 05/20/2008 ULTRASOUND   Hypertension    borderline...controlled since taking herself of HCTZ IN JULY   IBS (irritable bowel syndrome)    Murmur, cardiac    Neuropathy    OSA on CPAP    Osteoarthritis    Psoriasis    Sleep apnea    CPAP   Assessment: Patient is a 77yo female with elevated troponin. Pharmacy consulted for Heparin dosing.  Goal of Therapy:  Heparin level 0.3-0.7 units/ml Monitor platelets by anticoagulation protocol: Yes   1102 0301 HL 0.18, subtherapeutic  Plan:  Bolus 2400 units x 1 Increase heparin infusion to 1250  units/hr Recheck HL in 8 hr after rate change CBC daily while on heparin  Otelia Sergeant, PharmD, [facility] 11/18/2022 3:42 AM

## 2022-11-18 NOTE — Hospital Course (Addendum)
HPI: [name] is a 77 y.o. 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  She initially presented with a 3-day history of recurrent substernal chest pain radiating to jaw, both shoulders and left arm, improved with deep breathing.  Last nuclear stress test was in 2017.  By arrival to the ED she denied any of the symptoms.   Hospital course / significant events:  11/01: to ED. Troponin 1228 outpatient --> 1568 in ED --> 1483 on repeat. ST depression in lateral leads. CTA chest no PE, mild cardiac enlargement with prominent coronary artery calcifications. Cardiologist that did not think patient was having a STEMI. Admitted to hospitalist service on heparin gtt for NSTEMI / completed MI 11/02: remaining on heparin thru the weekend pending cardiac cath Mon 11/04. 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  EKG (+)new septal infarct with ST depression CTA chest (+)coronary artery calcifications S/p cardiac cath /today w/ PCI  Cardiology following  aspirin  Metoprolol ARB Plan add SGLT2i following cath Hx intolerance to atorvastatin w/ myalgias, will trial rosuvastatin - consider PCSK9i outpatient  Cardiology following  LHC and stent today, discharge readiness per cardiology likely for tomorrow    Diabetes mellitus without complication A1C 7.1 - reasonably well  controlled  Declining insulin, stopped this rx    Asthma Seasonal allergies OSA on CPAP Ventolin as needed Declining loratadine as substitution for cetirizine   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 Patient on Ilumya injection every 3 months   Declining labs  Declines labs that are not absolutely needed - orders dc  Counseled will need to continue heparin levels     DVT prophylaxis: on heparin IV fluids: no continuous IV fluids  Nutrition: cardiac diet Central lines / invasive devices: none  Code Status: FULL CODE  ACP documentation reviewed: none on file in [facility]  TOC needs: none at this time Barriers to dispo / significant pending items: cath today, hopefully d/c tomorrow if stable

## 2022-11-18 NOTE — Assessment & Plan Note (Signed)
Thus far she has not tolerated metformin and declines use of GLP 1 agonist.  Statin myalgia also reported.  Will encouarge her to consider SGLT2 ihibitor at next visit. Continue telmisartan  Lab Results  Component Value Date   HGBA1C 7.1 (H) 11/13/2022   Lab Results  Component Value Date   MICROALBUR <0.7 06/15/2022   MICROALBUR 1.1 09/16/2021

## 2022-11-18 NOTE — Progress Notes (Signed)
Known intolerance to atorvastatin with myalgias.  Prior diarrhea with ezetimibe.  She is not willing to try rosuvastatin.  Will need to pursue bempedoic acid versus PCSK9 inhibitor for further CV risk reduction.

## 2022-11-18 NOTE — Progress Notes (Signed)
PHARMACY - ANTICOAGULATION CONSULT NOTE  Pharmacy Consult for Heparin Infusion Indication: chest pain/ACS  Allergies  Allergen Reactions   Meloxicam Other (See Comments)    Hypertension, tachycardia   Statins Other (See Comments)    "Muscle weakness"   Sulfa Drugs Cross Reactors    Zostavax [Zoster Vaccine Live]     Bad reaction     Patient Measurements: Height: 5\' 6"  (167.6 cm) Weight: 97.6 kg (215 lb 2.7 oz) IBW/kg (Calculated) : 59.3 Heparin Dosing Weight: 81.2 kg  Vital Signs: Temp: 98.7 F (37.1 C) (11/02 1638) Temp Source: Oral (11/02 1544) BP: 104/66 (11/02 1638) Pulse Rate: 91 (11/02 1638)  Labs: Recent Labs    11/17/22 1002 11/17/22 1646 11/17/22 1812 11/18/22 0301 11/18/22 1214  HGB  --   --  13.4 13.3  --   HCT  --   --  40.1 38.8  --   PLT  --   --  359 348  --   APTT  --   --  25  --   --   LABPROT  --   --  14.3  --   --   INR  --   --  1.1  --   --   HEPARINUNFRC  --   --   --  0.18* 0.44  CREATININE 0.76 0.81  --   --   --   CKTOTAL 118  --   --   --   --   CKMB 3.0  --   --   --   --   TROPONINIHS  --  1,560* 1,483*  --   --     Estimated Creatinine Clearance: 67.4 mL/min (by C-G formula based on SCr of 0.81 mg/dL).   Medical History: Past Medical History:  Diagnosis Date   Asthma    Asthma due to environmental allergies    grass, mold, trees, dust   Cataract    Cellulitis    LEFT LEG   Diabetes mellitus    CONTROLLED ON DIET ALONE   Edema leg    LEFT LEG...CHRONIC   Endometrial hyperplasia    External hemorrhoid    Fatty liver    Glaucoma    Hepatic cyst    STABLE PER 05/20/2008 ULTRASOUND   Hypertension    borderline...controlled since taking herself of HCTZ IN JULY   IBS (irritable bowel syndrome)    Murmur, cardiac    Neuropathy    OSA on CPAP    Osteoarthritis    Psoriasis    Sleep apnea    CPAP   Assessment: [name] is a 77 y.o. female presenting with elevated hsTrop. PMH significant for T2DM, OSA,  psoriasis (immunosuppressed on Ilumya), PAD, HLD. Patient was not on [facility] PTA per chart review. Cardiology planning [facility] 11/4. Pharmacy has been consulted to initiate and manage heparin infusion.   Baseline Labs: aPTT 25, PT 14.3, INR 1.1, Hgb 13.4, Hct 40.1, Plt 359   Goal of Therapy:  Heparin level 0.3-0.7 units/ml Monitor platelets by anticoagulation protocol: Yes   Date Time HL Rate/Comment  11/2 0301 0.18 1000/subtherapeutic 11/2 1214 0.44 1250/therapeutic x1 11/2 2020 0.34 1250/therapeutic x2  Plan:  Continue heparin infusion at 1250 units/hr Check HL daily while on heparin infusion  Continue to monitor H&H and platelets daily while on heparin infusion   Celene Squibb, PharmD Clinical Pharmacist 11/18/2022 6:47 PM

## 2022-11-18 NOTE — Progress Notes (Signed)
PROGRESS NOTE    [name]   MVH:[id number] DOB: 12-Feb-1945  DOA: 11/17/2022 Date of Service: 11/18/22 which is hospital day 1  PCP: Sherlene Shams, MD    HPI: [name] is a 77 y.o. 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  She initially presented with a 3-day history of recurrent substernal chest pain radiating to jaw, both shoulders and left arm, improved with deep breathing.  Last nuclear stress test was in 2017.  By arrival to the ED she denied any of the symptoms.   Hospital course / significant events:  11/01: to ED. Troponin 1228 outpatient --> 1568 in ED --> 1483 on repeat. ST depression in lateral leads. CTA chest no PE, mild cardiac enlargement with prominent coronary artery calcifications. Cardiologist that did not think patient was having a STEMI. Admitted to hospitalist service on heparin gtt for NSTEMI / completed MI 11/02: remaining on heparin thru the weekend pending cardiac cath Mon 11/04   Consultants:  Cardiology   Procedures/Surgeries: none      ASSESSMENT & PLAN:   NSTEMI (non-ST elevated myocardial infarction) (HCC) C/w completed MI given downtrending troponins 1568--> 1483 EKG (+)new septal infarct with ST depression CTA chest (+)coronary artery calcifications heparin infusion aspirin  Metoprolol Hx intolerance to atorvastatin w/ myalgias, will trial rosuvastatin Cardiology following  Echo pending Plan [facility] on 11/20/2022 (0730 case) NPO p mn 11/04 If recurrent angina not controlled with antianginal escalation would pursue cardiac cath sooner  Diabetes mellitus without complication A1C 7.1 - reasonably well controlled  Declining insulin, stopped this rx    Asthma Seasonal allergies OSA on  CPAP Ventolin as needed Declining loratadine as substitution for cetirizine   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 Patient on Ilumya injection every 3 months   Declining labs  Declines labs that are not absolutely needed - orders dc  Counseled will need to continue heparin levels     DVT prophylaxis: on heparin IV fluids: no continuous IV fluids  Nutrition: cardiac diet Central lines / invasive devices: none  Code Status: FULL CODE  ACP documentation reviewed: none on file in [facility]  TOC needs: none at this time Barriers to dispo / significant pending items: cath Mon             Subjective / Brief ROS:  Patient reports feels fine at this time Denies CP/SOB.  Pain controlled.  Denies new weakness.  Tolerating diet.    Family Communication: family at bedside on rounds     Objective Findings:  Vitals:   11/18/22 0604 11/18/22 1020 11/18/22 1036 11/18/22 1200  BP: (!) 151/92 (!) 142/78 (!) 142/78 100/77  Pulse: 89 89 89 88  Resp: (!) 22 20  19   Temp:      TempSrc:      SpO2: 94% 96%  96%    Intake/Output Summary (Last 24 hours) at 11/18/2022 1347 Last data filed at 11/18/2022 1221 Gross per 24 hour  Intake 259.81 ml  Output --  Net 259.81 ml   There were no vitals filed for this visit.  Examination:  Physical Exam Constitutional:      General: She is not in acute distress.    Appearance: She is not ill-appearing.  Cardiovascular:     Rate  and Rhythm: Normal rate and regular rhythm.  Pulmonary:     Effort: Pulmonary effort is normal.     Breath sounds: Normal breath sounds.  Skin:    General: Skin is warm and dry.  Neurological:     General: No focal deficit present.     Mental Status: She is alert and oriented to person, place, and time. Mental status is at baseline.  Psychiatric:        Mood and Affect: Mood normal.        Behavior: Behavior normal.          Scheduled Medications:   aspirin EC  81 mg  Oral Daily   latanoprost  1 drop Both Eyes QHS   metoprolol succinate  25 mg Oral Daily    Continuous Infusions:  heparin 1,250 Units/hr (11/18/22 1221)    PRN Medications:  acetaminophen, albuterol, ALPRAZolam, nitroGLYCERIN, ondansetron (ZOFRAN) IV  Antimicrobials from admission:  Anti-infectives (From admission, onward)    None           Data Reviewed:  I have personally reviewed the following...  CBC: Recent Labs  Lab 11/13/22 0845 11/17/22 1812 11/18/22 0301  WBC 12.4* 16.3* 12.8*  NEUTROABS 7.4  --   --   HGB 14.1 13.4 13.3  HCT 43.1 40.1 38.8  MCV 87.9 88.3 87.0  PLT 351.0 359 348   Basic Metabolic Panel: Recent Labs  Lab 11/13/22 0845 11/17/22 1002 11/17/22 1646  NA 138 137 136  K 4.4 4.3 3.9  CL 101 103 103  CO2 27 23 24   GLUCOSE 154* 139* 143*  BUN 12 14 17   CREATININE 0.85 0.76 0.81  CALCIUM 9.2 9.1 8.6*   GFR: Estimated Creatinine Clearance: 67.4 mL/min (by C-G formula based on SCr of 0.81 mg/dL). Liver Function Tests: Recent Labs  Lab 11/13/22 0845 11/17/22 1002  AST 39* 26  ALT 28 20  ALKPHOS 78 76  BILITOT 1.1 1.5*  PROT 6.9 7.1  ALBUMIN 4.0 3.9   No results for input(s): "LIPASE", "AMYLASE" in the last 168 hours. No results for input(s): "AMMONIA" in the last 168 hours. Coagulation Profile: Recent Labs  Lab 11/17/22 1812  INR 1.1   Cardiac Enzymes: Recent Labs  Lab 11/17/22 1002  CKTOTAL 118  CKMB 3.0   BNP (last 3 results) No results for input(s): "PROBNP" in the last 8760 hours. HbA1C: No results for input(s): "HGBA1C" in the last 72 hours. CBG: Recent Labs  Lab 11/17/22 2012 11/17/22 2304 11/18/22 0420 11/18/22 1152  GLUCAP 133* 182* 149* 133*   Lipid Profile: No results for input(s): "CHOL", "HDL", "LDLCALC", "TRIG", "CHOLHDL", "LDLDIRECT" in the last 72 hours. Thyroid Function Tests: No results for input(s): "TSH", "T4TOTAL", "FREET4", "T3FREE", "THYROIDAB" in the last 72 hours. Anemia Panel: No  results for input(s): "VITAMINB12", "FOLATE", "FERRITIN", "TIBC", "IRON", "RETICCTPCT" in the last 72 hours. Most Recent Urinalysis On File:     Component Value Date/Time   COLORURINE YELLOW 03/14/2022 1407   APPEARANCEUR Sl Cloudy (A) 03/14/2022 1407   LABSPEC 1.020 03/14/2022 1407   PHURINE 6.0 03/14/2022 1407   GLUCOSEU NEGATIVE 03/14/2022 1407   HGBUR TRACE-INTACT (A) 03/14/2022 1407   BILIRUBINUR NEGATIVE 03/14/2022 1407   BILIRUBINUR 1+ 11/30/2021 1001   KETONESUR NEGATIVE 03/14/2022 1407   PROTEINUR neg 10/15/2014 0952   PROTEINUR 30 (A) 07/27/2007 1730   UROBILINOGEN 0.2 03/14/2022 1407   NITRITE POSITIVE (A) 03/14/2022 1407   LEUKOCYTESUR NEGATIVE 03/14/2022 1407   Sepsis Labs: @LABRCNTIP (procalcitonin:4,lacticidven:4) Microbiology: No  results found for this or any previous visit (from the past 240 hour(s)).    Radiology Studies last 3 days: ECHOCARDIOGRAM COMPLETE  Result Date: 11/18/2022    ECHOCARDIOGRAM REPORT   Patient Name:   CAYDANCE KUEHNLE Date of Exam: 11/18/2022 Medical Rec #:  [id number]     Height:       66.0 in Accession #:    [id number]    Weight:       215.1 lb Date of Birth:  09-14-1945      BSA:          2.063 m Patient Age:    77 years      BP:           156/92 mmHg Patient Gender: F             HR:           94 bpm. Exam Location:  [facility] Procedure: 2D Echo, Cardiac Doppler, Color Doppler and Intracardiac            Opacification Agent Indications:    NSTEMI  History:        Patient has no prior history of Echocardiogram examinations.                 PAD, Signs/Symptoms:Chest Pain; Risk Factors:Hypertension,                 Diabetes, Dyslipidemia, Former Smoker and Sleep Apnea.  Sonographer:    Dondra Prader RVT RCS Referring Phys: [id number] Andris Baumann  Sonographer Comments: Technically difficult study due to poor echo windows, suboptimal parasternal window, suboptimal apical window, suboptimal subcostal window and patient is obese. Image acquisition challenging due  to patient body habitus. IMPRESSIONS  1. Left ventricular ejection fraction, by estimation, is 35 to 40%. The left ventricle has moderately decreased function. The left ventricle demonstrates regional wall motion abnormalities (see scoring diagram/findings for description). There is mild concentric left ventricular hypertrophy. Left ventricular diastolic parameters are indeterminate. There is mild hypokinesis of the left ventricular, entire inferoseptal wall and inferior wall. There is moderate hypokinesis of the left ventricular, entire  anterolateral wall, inferolateral wall and anterior wall.  2. Right ventricular systolic function is normal. The right ventricular size is not well visualized. There is normal pulmonary artery systolic pressure.  3. The mitral valve is normal in structure. Mild mitral valve regurgitation. No evidence of mitral stenosis.  4. The aortic valve was not well visualized. There is moderate calcification of the aortic valve. There is mild thickening of the aortic valve. Aortic valve regurgitation is not visualized. Mild aortic valve stenosis.  5. The inferior vena cava is normal in size with greater than 50% respiratory variability, suggesting right atrial pressure of 3 mmHg. Comparison(s): No prior Echocardiogram. Conclusion(s)/Recommendation(s): Very difficult to see wall motion, even with use of echo contrast. Moderately reduced LVEF, anterior wall not well visualized, in contrast images lateral wall appears slightly more hypokinetic than other walls, but images  are poor sensitivity for wall motion. FINDINGS  Left Ventricle: Left ventricular ejection fraction, by estimation, is 35 to 40%. The left ventricle has moderately decreased function. The left ventricle demonstrates regional wall motion abnormalities. Mild hypokinesis of the left ventricular, entire inferoseptal wall and inferior wall. Moderate hypokinesis of the left ventricular, entire anterolateral wall, inferolateral wall  and anterior wall. Definity contrast agent was given IV to delineate the left ventricular endocardial borders. The left ventricular internal cavity size was normal in size. There is mild  concentric left ventricular hypertrophy. Left ventricular diastolic parameters are indeterminate. Right Ventricle: The right ventricular size is not well visualized. Right vetricular wall thickness was not well visualized. Right ventricular systolic function is normal. There is normal pulmonary artery systolic pressure. The tricuspid regurgitant velocity is 1.86 m/s, and with an assumed right atrial pressure of 3 mmHg, the estimated right ventricular systolic pressure is 16.8 mmHg. Left Atrium: Left atrial size was normal in size. Right Atrium: Right atrial size was not well visualized. Pericardium: There is no evidence of pericardial effusion. Mitral Valve: The mitral valve is normal in structure. Mild mitral valve regurgitation. No evidence of mitral valve stenosis. Tricuspid Valve: The tricuspid valve is normal in structure. Tricuspid valve regurgitation is trivial. No evidence of tricuspid stenosis. Aortic Valve: The aortic valve was not well visualized. There is moderate calcification of the aortic valve. There is mild thickening of the aortic valve. Aortic valve regurgitation is not visualized. Mild aortic stenosis is present. Aortic valve mean gradient measures 8.5 mmHg. Aortic valve peak gradient measures 15.1 mmHg. Aortic valve area, by VTI measures 1.07 cm. Pulmonic Valve: The pulmonic valve was not well visualized. Pulmonic valve regurgitation is not visualized. No evidence of pulmonic stenosis. Aorta: The aortic root, ascending aorta, aortic arch and descending aorta are all structurally normal, with no evidence of dilitation or obstruction. Venous: The inferior vena cava is normal in size with greater than 50% respiratory variability, suggesting right atrial pressure of 3 mmHg. IAS/Shunts: The interatrial septum was  not well visualized.  LEFT VENTRICLE PLAX 2D LVIDd:         4.75 cm   Diastology LVIDs:         3.55 cm   LV e' medial:    6.85 cm/s LV PW:         1.25 cm   LV E/e' medial:  16.1 LV IVS:        1.15 cm   LV e' lateral:   6.53 cm/s LVOT diam:     1.90 cm   LV E/e' lateral: 16.8 LV SV:         35 LV SV Index:   17 LVOT Area:     2.84 cm  RIGHT VENTRICLE             IVC RV S prime:     12.10 cm/s  IVC diam: 1.60 cm TAPSE (M-mode): 2.0 cm LEFT ATRIUM             Index LA diam:        2.80 cm 1.36 cm/m LA Vol (A2C):   43.1 ml 20.89 ml/m LA Vol (A4C):   55.4 ml 26.86 ml/m LA Biplane Vol: 50.6 ml 24.53 ml/m  AORTIC VALVE                     PULMONIC VALVE AV Area (Vmax):    1.24 cm      PV Vmax:       1.24 m/s AV Area (Vmean):   1.19 cm      PV Peak grad:  6.2 mmHg AV Area (VTI):     1.07 cm AV Vmax:           194.50 cm/s AV Vmean:          135.500 cm/s AV VTI:            0.324 m AV Peak Grad:      15.1 mmHg AV Mean Grad:  8.5 mmHg LVOT Vmax:         85.00 cm/s LVOT Vmean:        56.900 cm/s LVOT VTI:          0.123 m LVOT/AV VTI ratio: 0.38  AORTA Ao Root diam: 2.30 cm Ao Asc diam:  2.80 cm MITRAL VALVE                TRICUSPID VALVE MV Area (PHT): 6.27 cm     TR Peak grad:   13.8 mmHg MV Decel Time: 121 msec     TR Vmax:        186.00 cm/s MV E velocity: 110.00 cm/s MV A velocity: 103.00 cm/s  SHUNTS MV E/A ratio:  1.07         Systemic VTI:  0.12 m                             Systemic Diam: 1.90 cm Jodelle Red MD Electronically signed by Jodelle Red MD Signature Date/Time: 11/18/2022/12:16:38 PM    Final    CT Angio Chest PE W/Cm &/Or Wo Cm  Result Date: 11/17/2022 CLINICAL DATA:  Pulmonary embolus suspected with high probability. Elevated troponin levels. EXAM: CT ANGIOGRAPHY CHEST WITH CONTRAST TECHNIQUE: Multidetector CT imaging of the chest was performed using the standard protocol during bolus administration of intravenous contrast. Multiplanar CT image reconstructions and MIPs  were obtained to evaluate the vascular anatomy. RADIATION DOSE REDUCTION: This exam was performed according to the departmental dose-optimization program which includes automated exposure control, adjustment of the mA and/or kV according to patient size and/or use of iterative reconstruction technique. CONTRAST:  65mL OMNIPAQUE IOHEXOL 350 MG/ML SOLN COMPARISON:  Chest radiograph 11/17/2022 FINDINGS: Cardiovascular: Technically adequate study with good opacification of the central and segmental pulmonary arteries. Moderate motion artifact. No focal filling defects. No evidence of significant pulmonary embolus. Mild cardiac enlargement. No pericardial effusions. Normal caliber thoracic aorta. Few scattered calcifications in the aorta. Prominent coronary artery calcification. Mediastinum/Nodes: No enlarged mediastinal, hilar, or axillary lymph nodes. Thyroid gland, trachea, and esophagus demonstrate no significant findings. Lungs/Pleura: Mild dependent atelectasis in the lung bases. No airspace disease or consolidation. No pleural effusions. No pneumothorax. Upper Abdomen: No acute abnormalities. Musculoskeletal: Degenerative changes in the spine. No acute bony abnormalities. Review of the MIP images confirms the above findings. IMPRESSION: 1. No evidence of significant pulmonary embolus. 2. Cardiac enlargement with prominent coronary artery calcifications. 3. Mild dependent atelectasis in the lungs. No evidence of active pulmonary disease. Electronically Signed   By: Burman Nieves M.D.   On: 11/17/2022 18:44   DG Chest 2 View  Result Date: 11/17/2022 CLINICAL DATA:  Elevated troponin EXAM: CHEST - 2 VIEW COMPARISON:  03/05/2018 FINDINGS: Mild cardiac enlargement. No vascular congestion, edema, or consolidation. Blunting of the costophrenic angles suggests small bilateral pleural effusions. No pneumothorax. Mediastinal contours appear intact. Degenerative changes in the spine. IMPRESSION: 1. Cardiac  enlargement. 2. Small bilateral pleural effusions. 3. Lungs are otherwise clear. Electronically Signed   By: Burman Nieves M.D.   On: 11/17/2022 18:41        Sunnie Nielsen, DO [facility] 11/18/2022, 1:47 PM    Dictation software may have been used to generate the above note. Typos may occur and escape review in typed/dictated notes. Please contact Dr Lyn Hollingshead directly for clarity if needed.  Staff may message me via secure chat in Epic  but this may not receive an  immediate response,  please page me for urgent matters!  If 7PM-7AM, please contact night coverage [email or web address]

## 2022-11-19 DIAGNOSIS — I5041 Acute combined systolic (congestive) and diastolic (congestive) heart failure: Secondary | ICD-10-CM | POA: Diagnosis not present

## 2022-11-19 DIAGNOSIS — E1159 Type 2 diabetes mellitus with other circulatory complications: Secondary | ICD-10-CM | POA: Diagnosis not present

## 2022-11-19 DIAGNOSIS — Z7189 Other specified counseling: Secondary | ICD-10-CM

## 2022-11-19 DIAGNOSIS — I214 Non-ST elevation (NSTEMI) myocardial infarction: Secondary | ICD-10-CM | POA: Diagnosis not present

## 2022-11-19 LAB — CBC
HCT: 37.7 % (ref 36.0–46.0)
Hemoglobin: 13 g/dL (ref 12.0–15.0)
MCH: 29.6 pg (ref 26.0–34.0)
MCHC: 34.5 g/dL (ref 30.0–36.0)
MCV: 85.9 fL (ref 80.0–100.0)
Platelets: 354 10*3/uL (ref 150–400)
RBC: 4.39 MIL/uL (ref 3.87–5.11)
RDW: 12.6 % (ref 11.5–15.5)
WBC: 12.3 10*3/uL — ABNORMAL HIGH (ref 4.0–10.5)
nRBC: 0 % (ref 0.0–0.2)

## 2022-11-19 LAB — HEPARIN LEVEL (UNFRACTIONATED): Heparin Unfractionated: 0.34 [IU]/mL (ref 0.30–0.70)

## 2022-11-19 MED ORDER — SODIUM CHLORIDE 0.9 % WEIGHT BASED INFUSION: 3.0000 mL/kg/h | INTRAVENOUS | Status: DC

## 2022-11-19 MED ORDER — GUAIFENESIN-DM 100-10 MG/5ML PO SYRP
5.0000 mL | ORAL_SOLUTION | ORAL | Status: DC | PRN
  Administered 2022-11-19: 5 mL via ORAL
  Filled 2022-11-19 (×2): qty 10

## 2022-11-19 MED ORDER — SODIUM CHLORIDE 0.9 % WEIGHT BASED INFUSION
1.0000 mL/kg/h | INTRAVENOUS | Status: DC
  Administered 2022-11-20: 1 mL/kg/h via INTRAVENOUS

## 2022-11-19 MED ORDER — SODIUM CHLORIDE 0.9 % WEIGHT BASED INFUSION
3.0000 mL/kg/h | INTRAVENOUS | Status: DC
Start: 2022-11-20 — End: 2022-11-20
  Administered 2022-11-20: 3 mL/kg/h via INTRAVENOUS

## 2022-11-19 MED ORDER — LOSARTAN POTASSIUM 25 MG PO TABS
12.5000 mg | ORAL_TABLET | Freq: Every day | ORAL | Status: DC
  Administered 2022-11-19: 12.5 mg via ORAL
  Filled 2022-11-19: qty 1

## 2022-11-19 MED ORDER — SODIUM CHLORIDE 0.9 % WEIGHT BASED INFUSION: 1.0000 mL/kg/h | INTRAVENOUS | Status: DC

## 2022-11-19 MED ORDER — ASPIRIN 81 MG PO CHEW
81.0000 mg | CHEWABLE_TABLET | ORAL | Status: AC
  Administered 2022-11-20: 81 mg via ORAL
  Filled 2022-11-19: qty 1

## 2022-11-19 NOTE — Progress Notes (Signed)
PHARMACY - ANTICOAGULATION CONSULT NOTE  Pharmacy Consult for Heparin Infusion Indication: chest pain/ACS  Allergies  Allergen Reactions   Meloxicam Other (See Comments)    Hypertension, tachycardia   Statins Other (See Comments)    "Muscle weakness"   Sulfa Drugs Cross Reactors    Zostavax [Zoster Vaccine Live]     Bad reaction     Patient Measurements: Height: 5\' 6"  (167.6 cm) Weight: 97.6 kg (215 lb 2.7 oz) IBW/kg (Calculated) : 59.3 Heparin Dosing Weight: 81.2 kg  Vital Signs: Temp: 98.6 F (37 C) (11/03 0400) Temp Source: Oral (11/03 0400) BP: 130/80 (11/03 0400) Pulse Rate: 72 (11/03 0400)  Labs: Recent Labs    11/17/22 1002 11/17/22 1646 11/17/22 1812 11/17/22 1812 11/18/22 0301 11/18/22 1214 11/18/22 2020 11/19/22 0531  HGB  --   --  13.4   < > 13.3  --   --  13.0  HCT  --   --  40.1  --  38.8  --   --  37.7  PLT  --   --  359  --  348  --   --  354  APTT  --   --  25  --   --   --   --   --   LABPROT  --   --  14.3  --   --   --   --   --   INR  --   --  1.1  --   --   --   --   --   HEPARINUNFRC  --   --   --    < > 0.18* 0.44 0.34 0.34  CREATININE 0.76 0.81  --   --   --   --   --   --   CKTOTAL 118  --   --   --   --   --   --   --   CKMB 3.0  --   --   --   --   --   --   --   TROPONINIHS  --  1,560* 1,483*  --   --   --   --   --    < > = values in this interval not displayed.    Estimated Creatinine Clearance: 67.4 mL/min (by C-G formula based on SCr of 0.81 mg/dL).   Medical History: Past Medical History:  Diagnosis Date   Asthma    Asthma due to environmental allergies    grass, mold, trees, dust   Cataract    Cellulitis    LEFT LEG   Diabetes mellitus    CONTROLLED ON DIET ALONE   Edema leg    LEFT LEG...CHRONIC   Endometrial hyperplasia    External hemorrhoid    Fatty liver    Glaucoma    Hepatic cyst    STABLE PER 05/20/2008 ULTRASOUND   Hypertension    borderline...controlled since taking herself of HCTZ IN JULY   IBS  (irritable bowel syndrome)    Murmur, cardiac    Neuropathy    OSA on CPAP    Osteoarthritis    Psoriasis    Sleep apnea    CPAP   Assessment: [name] is a 77 y.o. female presenting with elevated hsTrop. PMH significant for T2DM, OSA, psoriasis (immunosuppressed on Ilumya), PAD, HLD. Patient was not on [facility] PTA per chart review. Cardiology planning [facility] 11/4. Pharmacy has been consulted to initiate and manage heparin infusion.  Baseline Labs: aPTT 25, PT 14.3, INR 1.1, Hgb 13.4, Hct 40.1, Plt 359   Goal of Therapy:  Heparin level 0.3-0.7 units/ml Monitor platelets by anticoagulation protocol: Yes   Date Time HL Rate/Comment  11/2 0301 0.18 1000/subtherapeutic 11/2 1214 0.44 1250/therapeutic x1 11/2 2020 0.34 1250/therapeutic x2 11/03 0531 0.34 1250/therapeutic x 3  Plan:  Continue heparin infusion at 1250 units/hr Check HL daily while on heparin infusion  Continue to monitor H&H and platelets daily while on heparin infusion   Otelia Sergeant, PharmD, [facility] 11/19/2022 6:27 AM

## 2022-11-19 NOTE — Progress Notes (Signed)
Progress Note  Patient Name: [name] Date of Encounter: 11/19/2022  Primary Cardiologist: New - consult by Dr. Cristal Deer, MD   Subjective   Continues to note very mild left axillary to upper left-sided chest discomfort that is 3/10 and "nothing that would stop me from doing anything." No dyspnea. Planning for cardiac cath 11/4.   Inpatient Medications    Scheduled Meds:  aspirin EC  81 mg Oral Daily   latanoprost  1 drop Both Eyes QHS   metoprolol succinate  25 mg Oral Daily   Continuous Infusions:  heparin 1,250 Units/hr (11/18/22 1221)   PRN Meds: acetaminophen, albuterol, ALPRAZolam, guaiFENesin-dextromethorphan, nitroGLYCERIN, ondansetron (ZOFRAN) IV   Vital Signs    Vitals:   11/18/22 1638 11/18/22 1800 11/18/22 1929 11/19/22 0400  BP: 104/66  (!) 156/89 130/80  Pulse: 91  100 72  Resp:   18 14  Temp: 98.7 F (37.1 C)  98.1 F (36.7 C) 98.6 F (37 C)  TempSrc:    Oral  SpO2: 98%  99% 98%  Weight:  97.6 kg    Height:  5\' 6"  (1.676 m)      Intake/Output Summary (Last 24 hours) at 11/19/2022 0710 Last data filed at 11/19/2022 0300 Gross per 24 hour  Intake 751.2 ml  Output --  Net 751.2 ml   Filed Weights   11/18/22 1800  Weight: 97.6 kg    Telemetry    SR - Personally Reviewed  ECG    No new tracings - Personally Reviewed  Physical Exam   GEN: No acute distress.   Neck: No JVD. Cardiac: RRR, I/VI systolic murmur RUSB, no rubs, or gallops.  Respiratory: Clear to auscultation bilaterally.  GI: Soft, nontender, non-distended.   MS: No edema; No deformity. Neuro:  Alert and oriented x 3; Nonfocal.  Psych: Normal affect.  Labs    Chemistry Recent Labs  Lab 11/13/22 0845 11/17/22 1002 11/17/22 1646  NA 138 137 136  K 4.4 4.3 3.9  CL 101 103 103  CO2 27 23 24   GLUCOSE 154* 139* 143*  BUN 12 14 17   CREATININE 0.85 0.76 0.81  CALCIUM 9.2 9.1 8.6*  PROT 6.9 7.1  --   ALBUMIN 4.0 3.9  --   AST 39* 26  --   ALT 28 20  --    ALKPHOS 78 76  --   BILITOT 1.1 1.5*  --   GFRNONAA  --   --  >60  ANIONGAP  --   --  9     Hematology Recent Labs  Lab 11/17/22 1812 11/18/22 0301 11/19/22 0531  WBC 16.3* 12.8* 12.3*  RBC 4.54 4.46 4.39  HGB 13.4 13.3 13.0  HCT 40.1 38.8 37.7  MCV 88.3 87.0 85.9  MCH 29.5 29.8 29.6  MCHC 33.4 34.3 34.5  RDW 12.7 12.6 12.6  PLT 359 348 354    Cardiac EnzymesNo results for input(s): "TROPONINI" in the last 168 hours. No results for input(s): "TROPIPOC" in the last 168 hours.   BNPNo results for input(s): "BNP", "PROBNP" in the last 168 hours.   DDimer No results for input(s): "DDIMER" in the last 168 hours.   Radiology    CT Angio Chest PE W/Cm &/Or Wo Cm  Result Date: 11/17/2022 IMPRESSION: 1. No evidence of significant pulmonary embolus. 2. Cardiac enlargement with prominent coronary artery calcifications. 3. Mild dependent atelectasis in the lungs. No evidence of active pulmonary disease. Electronically Signed   By: Marisa Cyphers.D.  On: 11/17/2022 18:44   DG Chest 2 View  Result Date: 11/17/2022 IMPRESSION: 1. Cardiac enlargement. 2. Small bilateral pleural effusions. 3. Lungs are otherwise clear. Electronically Signed   By: Burman Nieves M.D.   On: 11/17/2022 18:41    Cardiac Studies   2D echo 11/18/2022: 1. Left ventricular ejection fraction, by estimation, is 35 to 40%. The  left ventricle has moderately decreased function. The left ventricle  demonstrates regional wall motion abnormalities (see scoring  diagram/findings for description). There is mild  concentric left ventricular hypertrophy. Left ventricular diastolic  parameters are indeterminate. There is mild hypokinesis of the left  ventricular, entire inferoseptal wall and inferior wall. There is moderate  hypokinesis of the left ventricular, entire   anterolateral wall, inferolateral wall and anterior wall.   2. Right ventricular systolic function is normal. The right ventricular  size is  not well visualized. There is normal pulmonary artery systolic  pressure.   3. The mitral valve is normal in structure. Mild mitral valve  regurgitation. No evidence of mitral stenosis.   4. The aortic valve was not well visualized. There is moderate  calcification of the aortic valve. There is mild thickening of the aortic  valve. Aortic valve regurgitation is not visualized. Mild aortic valve  stenosis.   5. The inferior vena cava is normal in size with greater than 50%  respiratory variability, suggesting right atrial pressure of 3 mmHg.   Comparison(s): No prior Echocardiogram.   Patient Profile     77 y.o. female with history of coronary artery calcification noted on CT imaging, DM2, HTN, HLD with intolerance to atorvastatin, fatty liver, asthma, obesity, and sleep apnea  who is being seen today for the evaluation of NSTEMI and acute HFrEF at the request of Dr. Para March.   Assessment & Plan    1. NSTEMI: -Without symptoms of angina or cardiac decompensation -Troponin has peaked at 1560 and is currently down trending -ASA 81 mg -Heparin drip -Echo as outlined above -Plan for LHC on 11/20/2022 (0730 case) -N.p.o. at midnight on 11/4 -If she has recurrent angina that is unable to be controlled with antianginal escalation would pursue cardiac cath before 11/4 -She notes intolerance to atorvastatin secondary to myalgias, Zetia with diarrhea and declines trial of Crestor -Metoprolol succinate 25 mg   2.  Acute HFrEF: -Likely ischemic in etiology with an EF of 35 to 40% with mild hypokinesis of the entire inferior septal and inferior wall as well as moderate hypokinesis of the entire anterolateral, inferolateral, and anterior walls with normal RV systolic function and RVSP -Pursue ischemic evaluation as outlined above -Appears euvolemic and well compensated -Add losartan 12.5 mg daily with recommendation to transition to ARNI prior to discharge or in follow-up -Recommend further  escalation of GDMT with addition of MRA and SGLT2 inhibitor as able moving forward -Continue Toprol-XL 25 mg -Not requiring standing loop diuretic at this time -Will need follow-up echo in several months in the office following cardiac cath with possible intervention and optimization of GDMT -CHF education  3. HTN: -Blood pressure stable -Add losartan 12.5 mg as outlined above with continuation of metoprolol succinate 25 mg   4.  HLD: -Direct LDL 127 on 11/13/2022 with goal LDL now less than 55 -Intolerant to atorvastatin secondary to myalgias, intolerant to ezetimibe secondary to diarrhea -Refuses trial of rosuvastatin -Will need to pursue PCSK9i or bempedoic acid in the office   5.  DM2: -A1c 7.1 on 11/13/2022 -Not on metformin PTA secondary to  intolerance -SSI per IM -Would benefit from addition of SGLT2 inhibitor this admission as outlined above   6.  Obesity with OSA: -Weight loss encouraged through heart healthy diet and regular exercise -Continue PTA CPAP  7. Aortic stenosis: -Mild by echo this admission -Follow up in the office with periodic imaging   Informed Consent   Shared Decision Making/Informed Consent{  The risks [stroke (1 in 1000), death (1 in 1000), kidney failure [usually temporary] (1 in 500), bleeding (1 in 200), allergic reaction [possibly serious] (1 in 200)], benefits (diagnostic support and management of coronary artery disease) and alternatives of a cardiac catheterization were discussed in detail with Ms. Pardi and she is willing to proceed.      For questions or updates, please contact [facility] Please consult [email or web address] for contact info under Cardiology/STEMI.    Signed, Eula Listen, PA-C [facility] Pager: [phone] 11/19/2022, 7:10 AM

## 2022-11-19 NOTE — H&P (View-Only) (Signed)
Progress Note  Patient Name: [name] Date of Encounter: 11/19/2022  Primary Cardiologist: New - consult by Dr. Cristal Deer, MD   Subjective   Continues to note very mild left axillary to upper left-sided chest discomfort that is 3/10 and "nothing that would stop me from doing anything." No dyspnea. Planning for cardiac cath 11/4.   Inpatient Medications    Scheduled Meds:  aspirin EC  81 mg Oral Daily   latanoprost  1 drop Both Eyes QHS   metoprolol succinate  25 mg Oral Daily   Continuous Infusions:  heparin 1,250 Units/hr (11/18/22 1221)   PRN Meds: acetaminophen, albuterol, ALPRAZolam, guaiFENesin-dextromethorphan, nitroGLYCERIN, ondansetron (ZOFRAN) IV   Vital Signs    Vitals:   11/18/22 1638 11/18/22 1800 11/18/22 1929 11/19/22 0400  BP: 104/66  (!) 156/89 130/80  Pulse: 91  100 72  Resp:   18 14  Temp: 98.7 F (37.1 C)  98.1 F (36.7 C) 98.6 F (37 C)  TempSrc:    Oral  SpO2: 98%  99% 98%  Weight:  97.6 kg    Height:  5\' 6"  (1.676 m)      Intake/Output Summary (Last 24 hours) at 11/19/2022 0710 Last data filed at 11/19/2022 0300 Gross per 24 hour  Intake 751.2 ml  Output --  Net 751.2 ml   Filed Weights   11/18/22 1800  Weight: 97.6 kg    Telemetry    SR - Personally Reviewed  ECG    No new tracings - Personally Reviewed  Physical Exam   GEN: No acute distress.   Neck: No JVD. Cardiac: RRR, I/VI systolic murmur RUSB, no rubs, or gallops.  Respiratory: Clear to auscultation bilaterally.  GI: Soft, nontender, non-distended.   MS: No edema; No deformity. Neuro:  Alert and oriented x 3; Nonfocal.  Psych: Normal affect.  Labs    Chemistry Recent Labs  Lab 11/13/22 0845 11/17/22 1002 11/17/22 1646  NA 138 137 136  K 4.4 4.3 3.9  CL 101 103 103  CO2 27 23 24   GLUCOSE 154* 139* 143*  BUN 12 14 17   CREATININE 0.85 0.76 0.81  CALCIUM 9.2 9.1 8.6*  PROT 6.9 7.1  --   ALBUMIN 4.0 3.9  --   AST 39* 26  --   ALT 28 20  --    ALKPHOS 78 76  --   BILITOT 1.1 1.5*  --   GFRNONAA  --   --  >60  ANIONGAP  --   --  9     Hematology Recent Labs  Lab 11/17/22 1812 11/18/22 0301 11/19/22 0531  WBC 16.3* 12.8* 12.3*  RBC 4.54 4.46 4.39  HGB 13.4 13.3 13.0  HCT 40.1 38.8 37.7  MCV 88.3 87.0 85.9  MCH 29.5 29.8 29.6  MCHC 33.4 34.3 34.5  RDW 12.7 12.6 12.6  PLT 359 348 354    Cardiac EnzymesNo results for input(s): "TROPONINI" in the last 168 hours. No results for input(s): "TROPIPOC" in the last 168 hours.   BNPNo results for input(s): "BNP", "PROBNP" in the last 168 hours.   DDimer No results for input(s): "DDIMER" in the last 168 hours.   Radiology    CT Angio Chest PE W/Cm &/Or Wo Cm  Result Date: 11/17/2022 IMPRESSION: 1. No evidence of significant pulmonary embolus. 2. Cardiac enlargement with prominent coronary artery calcifications. 3. Mild dependent atelectasis in the lungs. No evidence of active pulmonary disease. Electronically Signed   By: Marisa Cyphers.D.  On: 11/17/2022 18:44   DG Chest 2 View  Result Date: 11/17/2022 IMPRESSION: 1. Cardiac enlargement. 2. Small bilateral pleural effusions. 3. Lungs are otherwise clear. Electronically Signed   By: Burman Nieves M.D.   On: 11/17/2022 18:41    Cardiac Studies   2D echo 11/18/2022: 1. Left ventricular ejection fraction, by estimation, is 35 to 40%. The  left ventricle has moderately decreased function. The left ventricle  demonstrates regional wall motion abnormalities (see scoring  diagram/findings for description). There is mild  concentric left ventricular hypertrophy. Left ventricular diastolic  parameters are indeterminate. There is mild hypokinesis of the left  ventricular, entire inferoseptal wall and inferior wall. There is moderate  hypokinesis of the left ventricular, entire   anterolateral wall, inferolateral wall and anterior wall.   2. Right ventricular systolic function is normal. The right ventricular  size is  not well visualized. There is normal pulmonary artery systolic  pressure.   3. The mitral valve is normal in structure. Mild mitral valve  regurgitation. No evidence of mitral stenosis.   4. The aortic valve was not well visualized. There is moderate  calcification of the aortic valve. There is mild thickening of the aortic  valve. Aortic valve regurgitation is not visualized. Mild aortic valve  stenosis.   5. The inferior vena cava is normal in size with greater than 50%  respiratory variability, suggesting right atrial pressure of 3 mmHg.   Comparison(s): No prior Echocardiogram.   Patient Profile     77 y.o. female with history of coronary artery calcification noted on CT imaging, DM2, HTN, HLD with intolerance to atorvastatin, fatty liver, asthma, obesity, and sleep apnea  who is being seen today for the evaluation of NSTEMI and acute HFrEF at the request of Dr. Para March.   Assessment & Plan    1. NSTEMI: -Without symptoms of angina or cardiac decompensation -Troponin has peaked at 1560 and is currently down trending -ASA 81 mg -Heparin drip -Echo as outlined above -Plan for LHC on 11/20/2022 (0730 case) -N.p.o. at midnight on 11/4 -If she has recurrent angina that is unable to be controlled with antianginal escalation would pursue cardiac cath before 11/4 -She notes intolerance to atorvastatin secondary to myalgias, Zetia with diarrhea and declines trial of Crestor -Metoprolol succinate 25 mg   2.  Acute HFrEF: -Likely ischemic in etiology with an EF of 35 to 40% with mild hypokinesis of the entire inferior septal and inferior wall as well as moderate hypokinesis of the entire anterolateral, inferolateral, and anterior walls with normal RV systolic function and RVSP -Pursue ischemic evaluation as outlined above -Appears euvolemic and well compensated -Add losartan 12.5 mg daily with recommendation to transition to ARNI prior to discharge or in follow-up -Recommend further  escalation of GDMT with addition of MRA and SGLT2 inhibitor as able moving forward -Continue Toprol-XL 25 mg -Not requiring standing loop diuretic at this time -Will need follow-up echo in several months in the office following cardiac cath with possible intervention and optimization of GDMT -CHF education  3. HTN: -Blood pressure stable -Add losartan 12.5 mg as outlined above with continuation of metoprolol succinate 25 mg   4.  HLD: -Direct LDL 127 on 11/13/2022 with goal LDL now less than 55 -Intolerant to atorvastatin secondary to myalgias, intolerant to ezetimibe secondary to diarrhea -Refuses trial of rosuvastatin -Will need to pursue PCSK9i or bempedoic acid in the office   5.  DM2: -A1c 7.1 on 11/13/2022 -Not on metformin PTA secondary to  intolerance -SSI per IM -Would benefit from addition of SGLT2 inhibitor this admission as outlined above   6.  Obesity with OSA: -Weight loss encouraged through heart healthy diet and regular exercise -Continue PTA CPAP  7. Aortic stenosis: -Mild by echo this admission -Follow up in the office with periodic imaging   Informed Consent   Shared Decision Making/Informed Consent{  The risks [stroke (1 in 1000), death (1 in 1000), kidney failure [usually temporary] (1 in 500), bleeding (1 in 200), allergic reaction [possibly serious] (1 in 200)], benefits (diagnostic support and management of coronary artery disease) and alternatives of a cardiac catheterization were discussed in detail with Ms. Pardi and she is willing to proceed.      For questions or updates, please contact [facility] Please consult [email or web address] for contact info under Cardiology/STEMI.    Signed, Eula Listen, PA-C [facility] Pager: [phone] 11/19/2022, 7:10 AM

## 2022-11-19 NOTE — Progress Notes (Signed)
PROGRESS NOTE    [name]   KGM:[id number] DOB: 1945/04/03  DOA: 11/17/2022 Date of Service: 11/19/22 which is hospital day 2  PCP: Sherlene Shams, MD    HPI: [name] is a 77 y.o. 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  She initially presented with a 3-day history of recurrent substernal chest pain radiating to jaw, both shoulders and left arm, improved with deep breathing.  Last nuclear stress test was in 2017.  By arrival to the ED she denied any of the symptoms.   Hospital course / significant events:  11/01: to ED. Troponin 1228 outpatient --> 1568 in ED --> 1483 on repeat. ST depression in lateral leads. CTA chest no PE, mild cardiac enlargement with prominent coronary artery calcifications. Cardiologist that did not think patient was having a STEMI. Admitted to hospitalist service on heparin gtt for NSTEMI / completed MI 11/02: remaining on heparin thru the weekend pending cardiac cath Mon 11/04. 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  EKG (+)new septal infarct with ST depression CTA chest (+)coronary artery calcifications heparin infusion aspirin  Metoprolol Adding ARB today  Plan add SGLT2i following cath Hx intolerance to atorvastatin w/ myalgias, will trial rosuvastatin - consider [id number] outpatient  Cardiology following  Plan LHC on 11/20/2022 (0730 case) NPO p mn 11/04 If recurrent angina not controlled with antianginal escalation would pursue  cardiac cath sooner  Diabetes mellitus without complication A1C 7.1 - reasonably well controlled  Declining insulin, stopped this rx    Asthma Seasonal allergies OSA on CPAP Ventolin as needed Declining loratadine as substitution for cetirizine   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 Patient on Ilumya injection every 3 months   Declining labs  Declines labs that are not absolutely needed - orders dc  Counseled will need to continue heparin levels     DVT prophylaxis: on heparin IV fluids: no continuous IV fluids  Nutrition: cardiac diet Central lines / invasive devices: none  Code Status: FULL CODE  ACP documentation reviewed: none on file in [facility]  TOC needs: none at this time Barriers to dispo / significant pending items: cath tomorrow              Subjective / Brief ROS:  Patient reports feels fine at this time, no concerns  Denies CP/SOB.  Pain controlled.  Denies new weakness.  Tolerating diet.    Family Communication: none at this time     Objective Findings:  Vitals:   11/18/22 1800 11/18/22 1929 11/19/22 0400 11/19/22 0755  BP:  (!) 156/89 130/80 (!) 153/80  Pulse:  100 72 85  Resp:  18 14 18   Temp:  98.1 F (36.7 C) 98.6 F (37 C) 98 F (36.7 C)  TempSrc:   Oral Oral  SpO2:  99% 98% 94%  Weight: 97.6 kg     Height: 5\' 6"  (1.676 m)       Intake/Output Summary (Last 24 hours) at 11/19/2022 1227 Last data filed at 11/19/2022 0300 Gross  per 24 hour  Intake 621.63 ml  Output --  Net 621.63 ml   Filed Weights   11/18/22 1800  Weight: 97.6 kg    Examination:  Physical Exam Constitutional:      General: She is not in acute distress.    Appearance: She is not ill-appearing.  Cardiovascular:     Rate and Rhythm: Normal rate and regular rhythm.  Pulmonary:     Effort: Pulmonary effort is normal.     Breath sounds: Normal breath sounds.  Skin:    General: Skin is warm and dry.  Neurological:      General: No focal deficit present.     Mental Status: She is alert and oriented to person, place, and time. Mental status is at baseline.  Psychiatric:        Mood and Affect: Mood normal.        Behavior: Behavior normal.          Scheduled Medications:   aspirin EC  81 mg Oral Daily   latanoprost  1 drop Both Eyes QHS   losartan  12.5 mg Oral Daily   metoprolol succinate  25 mg Oral Daily    Continuous Infusions:  heparin 1,250 Units/hr (11/19/22 0720)    PRN Medications:  acetaminophen, albuterol, ALPRAZolam, guaiFENesin-dextromethorphan, nitroGLYCERIN, ondansetron (ZOFRAN) IV  Antimicrobials from admission:  Anti-infectives (From admission, onward)    None           Data Reviewed:  I have personally reviewed the following...  CBC: Recent Labs  Lab 11/13/22 0845 11/17/22 1812 11/18/22 0301 11/19/22 0531  WBC 12.4* 16.3* 12.8* 12.3*  NEUTROABS 7.4  --   --   --   HGB 14.1 13.4 13.3 13.0  HCT 43.1 40.1 38.8 37.7  MCV 87.9 88.3 87.0 85.9  PLT 351.0 359 348 354   Basic Metabolic Panel: Recent Labs  Lab 11/13/22 0845 11/17/22 1002 11/17/22 1646  NA 138 137 136  K 4.4 4.3 3.9  CL 101 103 103  CO2 27 23 24   GLUCOSE 154* 139* 143*  BUN 12 14 17   CREATININE 0.85 0.76 0.81  CALCIUM 9.2 9.1 8.6*   GFR: Estimated Creatinine Clearance: 67.4 mL/min (by C-G formula based on SCr of 0.81 mg/dL). Liver Function Tests: Recent Labs  Lab 11/13/22 0845 11/17/22 1002  AST 39* 26  ALT 28 20  ALKPHOS 78 76  BILITOT 1.1 1.5*  PROT 6.9 7.1  ALBUMIN 4.0 3.9   No results for input(s): "LIPASE", "AMYLASE" in the last 168 hours. No results for input(s): "AMMONIA" in the last 168 hours. Coagulation Profile: Recent Labs  Lab 11/17/22 1812  INR 1.1   Cardiac Enzymes: Recent Labs  Lab 11/17/22 1002  CKTOTAL 118  CKMB 3.0   BNP (last 3 results) No results for input(s): "PROBNP" in the last 8760 hours. HbA1C: No results for input(s): "HGBA1C" in  the last 72 hours. CBG: Recent Labs  Lab 11/17/22 2012 11/17/22 2304 11/18/22 0420 11/18/22 1152  GLUCAP 133* 182* 149* 133*   Lipid Profile: No results for input(s): "CHOL", "HDL", "LDLCALC", "TRIG", "CHOLHDL", "LDLDIRECT" in the last 72 hours. Thyroid Function Tests: No results for input(s): "TSH", "T4TOTAL", "FREET4", "T3FREE", "THYROIDAB" in the last 72 hours. Anemia Panel: No results for input(s): "VITAMINB12", "FOLATE", "FERRITIN", "TIBC", "IRON", "RETICCTPCT" in the last 72 hours. Most Recent Urinalysis On File:     Component Value Date/Time   COLORURINE YELLOW 03/14/2022 1407   APPEARANCEUR Sl Cloudy (A) 03/14/2022 1407  LABSPEC 1.020 03/14/2022 1407   PHURINE 6.0 03/14/2022 1407   GLUCOSEU NEGATIVE 03/14/2022 1407   HGBUR TRACE-INTACT (A) 03/14/2022 1407   BILIRUBINUR NEGATIVE 03/14/2022 1407   BILIRUBINUR 1+ 11/30/2021 1001   KETONESUR NEGATIVE 03/14/2022 1407   PROTEINUR neg 10/15/2014 0952   PROTEINUR 30 (A) 07/27/2007 1730   UROBILINOGEN 0.2 03/14/2022 1407   NITRITE POSITIVE (A) 03/14/2022 1407   LEUKOCYTESUR NEGATIVE 03/14/2022 1407   Sepsis Labs: @LABRCNTIP (procalcitonin:4,lacticidven:4) Microbiology: No results found for this or any previous visit (from the past 240 hour(s)).    Radiology Studies last 3 days: ECHOCARDIOGRAM COMPLETE  Result Date: 11/18/2022    ECHOCARDIOGRAM REPORT   Patient Name:   GENESSIS FLANARY Date of Exam: 11/18/2022 Medical Rec #:  [id number]     Height:       66.0 in Accession #:    [id number]    Weight:       215.1 lb Date of Birth:  07/31/1945      BSA:          2.063 m Patient Age:    77 years      BP:           156/92 mmHg Patient Gender: F             HR:           94 bpm. Exam Location:  [facility] Procedure: 2D Echo, Cardiac Doppler, Color Doppler and Intracardiac            Opacification Agent Indications:    NSTEMI  History:        Patient has no prior history of Echocardiogram examinations.                 PAD, Signs/Symptoms:Chest  Pain; Risk Factors:Hypertension,                 Diabetes, Dyslipidemia, Former Smoker and Sleep Apnea.  Sonographer:    Dondra Prader RVT RCS Referring Phys: [id number] Andris Baumann  Sonographer Comments: Technically difficult study due to poor echo windows, suboptimal parasternal window, suboptimal apical window, suboptimal subcostal window and patient is obese. Image acquisition challenging due to patient body habitus. IMPRESSIONS  1. Left ventricular ejection fraction, by estimation, is 35 to 40%. The left ventricle has moderately decreased function. The left ventricle demonstrates regional wall motion abnormalities (see scoring diagram/findings for description). There is mild concentric left ventricular hypertrophy. Left ventricular diastolic parameters are indeterminate. There is mild hypokinesis of the left ventricular, entire inferoseptal wall and inferior wall. There is moderate hypokinesis of the left ventricular, entire  anterolateral wall, inferolateral wall and anterior wall.  2. Right ventricular systolic function is normal. The right ventricular size is not well visualized. There is normal pulmonary artery systolic pressure.  3. The mitral valve is normal in structure. Mild mitral valve regurgitation. No evidence of mitral stenosis.  4. The aortic valve was not well visualized. There is moderate calcification of the aortic valve. There is mild thickening of the aortic valve. Aortic valve regurgitation is not visualized. Mild aortic valve stenosis.  5. The inferior vena cava is normal in size with greater than 50% respiratory variability, suggesting right atrial pressure of 3 mmHg. Comparison(s): No prior Echocardiogram. Conclusion(s)/Recommendation(s): Very difficult to see wall motion, even with use of echo contrast. Moderately reduced LVEF, anterior wall not well visualized, in contrast images lateral wall appears slightly more hypokinetic than other walls, but images  are poor sensitivity for wall  motion. FINDINGS  Left Ventricle: Left ventricular ejection fraction, by estimation, is 35 to 40%. The left ventricle has moderately decreased function. The left ventricle demonstrates regional wall motion abnormalities. Mild hypokinesis of the left ventricular, entire inferoseptal wall and inferior wall. Moderate hypokinesis of the left ventricular, entire anterolateral wall, inferolateral wall and anterior wall. Definity contrast agent was given IV to delineate the left ventricular endocardial borders. The left ventricular internal cavity size was normal in size. There is mild concentric left ventricular hypertrophy. Left ventricular diastolic parameters are indeterminate. Right Ventricle: The right ventricular size is not well visualized. Right vetricular wall thickness was not well visualized. Right ventricular systolic function is normal. There is normal pulmonary artery systolic pressure. The tricuspid regurgitant velocity is 1.86 m/s, and with an assumed right atrial pressure of 3 mmHg, the estimated right ventricular systolic pressure is 16.8 mmHg. Left Atrium: Left atrial size was normal in size. Right Atrium: Right atrial size was not well visualized. Pericardium: There is no evidence of pericardial effusion. Mitral Valve: The mitral valve is normal in structure. Mild mitral valve regurgitation. No evidence of mitral valve stenosis. Tricuspid Valve: The tricuspid valve is normal in structure. Tricuspid valve regurgitation is trivial. No evidence of tricuspid stenosis. Aortic Valve: The aortic valve was not well visualized. There is moderate calcification of the aortic valve. There is mild thickening of the aortic valve. Aortic valve regurgitation is not visualized. Mild aortic stenosis is present. Aortic valve mean gradient measures 8.5 mmHg. Aortic valve peak gradient measures 15.1 mmHg. Aortic valve area, by VTI measures 1.07 cm. Pulmonic Valve: The pulmonic valve was not well visualized. Pulmonic valve  regurgitation is not visualized. No evidence of pulmonic stenosis. Aorta: The aortic root, ascending aorta, aortic arch and descending aorta are all structurally normal, with no evidence of dilitation or obstruction. Venous: The inferior vena cava is normal in size with greater than 50% respiratory variability, suggesting right atrial pressure of 3 mmHg. IAS/Shunts: The interatrial septum was not well visualized.  LEFT VENTRICLE PLAX 2D LVIDd:         4.75 cm   Diastology LVIDs:         3.55 cm   LV e' medial:    6.85 cm/s LV PW:         1.25 cm   LV E/e' medial:  16.1 LV IVS:        1.15 cm   LV e' lateral:   6.53 cm/s LVOT diam:     1.90 cm   LV E/e' lateral: 16.8 LV SV:         35 LV SV Index:   17 LVOT Area:     2.84 cm  RIGHT VENTRICLE             IVC RV S prime:     12.10 cm/s  IVC diam: 1.60 cm TAPSE (M-mode): 2.0 cm LEFT ATRIUM             Index LA diam:        2.80 cm 1.36 cm/m LA Vol (A2C):   43.1 ml 20.89 ml/m LA Vol (A4C):   55.4 ml 26.86 ml/m LA Biplane Vol: 50.6 ml 24.53 ml/m  AORTIC VALVE                     PULMONIC VALVE AV Area (Vmax):    1.24 cm      PV Vmax:       1.24 m/s AV Area (Vmean):   1.19  cm      PV Peak grad:  6.2 mmHg AV Area (VTI):     1.07 cm AV Vmax:           194.50 cm/s AV Vmean:          135.500 cm/s AV VTI:            0.324 m AV Peak Grad:      15.1 mmHg AV Mean Grad:      8.5 mmHg LVOT Vmax:         85.00 cm/s LVOT Vmean:        56.900 cm/s LVOT VTI:          0.123 m LVOT/AV VTI ratio: 0.38  AORTA Ao Root diam: 2.30 cm Ao Asc diam:  2.80 cm MITRAL VALVE                TRICUSPID VALVE MV Area (PHT): 6.27 cm     TR Peak grad:   13.8 mmHg MV Decel Time: 121 msec     TR Vmax:        186.00 cm/s MV E velocity: 110.00 cm/s MV A velocity: 103.00 cm/s  SHUNTS MV E/A ratio:  1.07         Systemic VTI:  0.12 m                             Systemic Diam: 1.90 cm Jodelle Red MD Electronically signed by Jodelle Red MD Signature Date/Time: 11/18/2022/12:16:38 PM     Final    CT Angio Chest PE W/Cm &/Or Wo Cm  Result Date: 11/17/2022 CLINICAL DATA:  Pulmonary embolus suspected with high probability. Elevated troponin levels. EXAM: CT ANGIOGRAPHY CHEST WITH CONTRAST TECHNIQUE: Multidetector CT imaging of the chest was performed using the standard protocol during bolus administration of intravenous contrast. Multiplanar CT image reconstructions and MIPs were obtained to evaluate the vascular anatomy. RADIATION DOSE REDUCTION: This exam was performed according to the departmental dose-optimization program which includes automated exposure control, adjustment of the mA and/or kV according to patient size and/or use of iterative reconstruction technique. CONTRAST:  65mL OMNIPAQUE IOHEXOL 350 MG/ML SOLN COMPARISON:  Chest radiograph 11/17/2022 FINDINGS: Cardiovascular: Technically adequate study with good opacification of the central and segmental pulmonary arteries. Moderate motion artifact. No focal filling defects. No evidence of significant pulmonary embolus. Mild cardiac enlargement. No pericardial effusions. Normal caliber thoracic aorta. Few scattered calcifications in the aorta. Prominent coronary artery calcification. Mediastinum/Nodes: No enlarged mediastinal, hilar, or axillary lymph nodes. Thyroid gland, trachea, and esophagus demonstrate no significant findings. Lungs/Pleura: Mild dependent atelectasis in the lung bases. No airspace disease or consolidation. No pleural effusions. No pneumothorax. Upper Abdomen: No acute abnormalities. Musculoskeletal: Degenerative changes in the spine. No acute bony abnormalities. Review of the MIP images confirms the above findings. IMPRESSION: 1. No evidence of significant pulmonary embolus. 2. Cardiac enlargement with prominent coronary artery calcifications. 3. Mild dependent atelectasis in the lungs. No evidence of active pulmonary disease. Electronically Signed   By: Burman Nieves M.D.   On: 11/17/2022 18:44   DG Chest 2  View  Result Date: 11/17/2022 CLINICAL DATA:  Elevated troponin EXAM: CHEST - 2 VIEW COMPARISON:  03/05/2018 FINDINGS: Mild cardiac enlargement. No vascular congestion, edema, or consolidation. Blunting of the costophrenic angles suggests small bilateral pleural effusions. No pneumothorax. Mediastinal contours appear intact. Degenerative changes in the spine. IMPRESSION: 1. Cardiac enlargement. 2. Small bilateral pleural effusions. 3. Lungs  are otherwise clear. Electronically Signed   By: Burman Nieves M.D.   On: 11/17/2022 18:41        Sunnie Nielsen, DO [facility] 11/19/2022, 12:27 PM    Dictation software may have been used to generate the above note. Typos may occur and escape review in typed/dictated notes. Please contact Dr Lyn Hollingshead directly for clarity if needed.  Staff may message me via secure chat in Epic  but this may not receive an immediate response,  please page me for urgent matters!  If 7PM-7AM, please contact night coverage [email or web address]

## 2022-11-20 ENCOUNTER — Encounter: Payer: Self-pay | Admitting: Oncology

## 2022-11-20 ENCOUNTER — Other Ambulatory Visit (HOSPITAL_COMMUNITY): Payer: Self-pay

## 2022-11-20 ENCOUNTER — Other Ambulatory Visit: Payer: Self-pay

## 2022-11-20 ENCOUNTER — Encounter
Admission: EM | Disposition: A | Discharge status: Home / Self Care | Payer: Self-pay | Source: Home / Self Care | Attending: Osteopathic Medicine

## 2022-11-20 DIAGNOSIS — I2510 Atherosclerotic heart disease of native coronary artery without angina pectoris: Secondary | ICD-10-CM

## 2022-11-20 DIAGNOSIS — I214 Non-ST elevation (NSTEMI) myocardial infarction: Secondary | ICD-10-CM

## 2022-11-20 HISTORY — PX: CORONARY STENT INTERVENTION: CATH118234

## 2022-11-20 HISTORY — PX: LEFT HEART CATH AND CORONARY ANGIOGRAPHY: CATH118249

## 2022-11-20 LAB — CBC
HCT: 42.4 % (ref 36.0–46.0)
Hemoglobin: 14.5 g/dL (ref 12.0–15.0)
MCH: 29.7 pg (ref 26.0–34.0)
MCHC: 34.2 g/dL (ref 30.0–36.0)
MCV: 86.7 fL (ref 80.0–100.0)
Platelets: 378 10*3/uL (ref 150–400)
RBC: 4.89 MIL/uL (ref 3.87–5.11)
RDW: 12.7 % (ref 11.5–15.5)
WBC: 11.9 10*3/uL — ABNORMAL HIGH (ref 4.0–10.5)
nRBC: 0 % (ref 0.0–0.2)

## 2022-11-20 LAB — POCT ACTIVATED CLOTTING TIME
Activated Clotting Time: 302 s
Activated Clotting Time: 222 s

## 2022-11-20 LAB — LIPOPROTEIN A (LPA): Lipoprotein (a): 145.8 nmol/L — ABNORMAL HIGH (ref <75.0)

## 2022-11-20 LAB — GLUCOSE, CAPILLARY
Glucose-Capillary: 123 mg/dL — ABNORMAL HIGH (ref 70–99)
Glucose-Capillary: 118 mg/dL — ABNORMAL HIGH (ref 70–99)

## 2022-11-20 LAB — HEPARIN LEVEL (UNFRACTIONATED): Heparin Unfractionated: 0.25 [IU]/mL — ABNORMAL LOW (ref 0.30–0.70)

## 2022-11-20 SURGERY — LEFT HEART CATH AND CORONARY ANGIOGRAPHY
Anesthesia: Moderate Sedation

## 2022-11-20 MED ORDER — NITROGLYCERIN 1 MG/10 ML FOR IR/CATH LAB
INTRA_ARTERIAL | Status: DC | PRN
  Administered 2022-11-20: 200 ug via INTRA_ARTERIAL

## 2022-11-20 MED ORDER — SODIUM CHLORIDE 0.9 % IV BOLUS
250.0000 mL | Freq: Once | INTRAVENOUS | Status: AC
  Administered 2022-11-20: 250 mL via INTRAVENOUS

## 2022-11-20 MED ORDER — TICAGRELOR 90 MG PO TABS
ORAL_TABLET | ORAL | Status: DC | PRN
  Administered 2022-11-20: 180 mg via ORAL

## 2022-11-20 MED ORDER — HEPARIN (PORCINE) IN NACL 2000-0.9 UNIT/L-% IV SOLN
INTRAVENOUS | Status: DC | PRN
  Administered 2022-11-20: 1000 mL

## 2022-11-20 MED ORDER — VERAPAMIL HCL 2.5 MG/ML IV SOLN
INTRAVENOUS | Status: DC | PRN
  Administered 2022-11-20: 2.5 mg via INTRAVENOUS

## 2022-11-20 MED ORDER — MIDAZOLAM HCL 2 MG/2ML IJ SOLN
INTRAMUSCULAR | Status: DC | PRN
  Administered 2022-11-20 (×2): 1 mg via INTRAVENOUS

## 2022-11-20 MED ORDER — SODIUM CHLORIDE 0.9% FLUSH
3.0000 mL | Freq: Two times a day (BID) | INTRAVENOUS | Status: DC
  Administered 2022-11-20 – 2022-11-21 (×3): 3 mL via INTRAVENOUS

## 2022-11-20 MED ORDER — SODIUM CHLORIDE 0.9 % IV SOLN: 250.0000 mL | INTRAVENOUS | Status: AC | PRN

## 2022-11-20 MED ORDER — VERAPAMIL HCL 2.5 MG/ML IV SOLN
INTRAVENOUS | Status: AC
  Filled 2022-11-20: qty 2

## 2022-11-20 MED ORDER — HEPARIN SODIUM (PORCINE) 1000 UNIT/ML IJ SOLN
INTRAMUSCULAR | Status: DC | PRN
  Administered 2022-11-20: 2000 [IU] via INTRAVENOUS
  Administered 2022-11-20 (×2): 5000 [IU] via INTRAVENOUS

## 2022-11-20 MED ORDER — FENTANYL CITRATE (PF) 100 MCG/2ML IJ SOLN
INTRAMUSCULAR | Status: AC
  Filled 2022-11-20: qty 2

## 2022-11-20 MED ORDER — HEPARIN SODIUM (PORCINE) 1000 UNIT/ML IJ SOLN
INTRAMUSCULAR | Status: AC
  Filled 2022-11-20: qty 10

## 2022-11-20 MED ORDER — IOHEXOL 300 MG/ML  SOLN
INTRAMUSCULAR | Status: DC | PRN
  Administered 2022-11-20: 128 mL

## 2022-11-20 MED ORDER — HEPARIN BOLUS VIA INFUSION
1200.0000 [IU] | Freq: Once | INTRAVENOUS | Status: AC
  Administered 2022-11-20: 1200 [IU] via INTRAVENOUS
  Filled 2022-11-20: qty 1200

## 2022-11-20 MED ORDER — CARVEDILOL 6.25 MG PO TABS
6.2500 mg | ORAL_TABLET | Freq: Two times a day (BID) | ORAL | Status: DC
  Administered 2022-11-21: 6.25 mg via ORAL
  Filled 2022-11-20: qty 1

## 2022-11-20 MED ORDER — LABETALOL HCL 5 MG/ML IV SOLN: 10.0000 mg | INTRAVENOUS | Status: AC | PRN

## 2022-11-20 MED ORDER — MIDAZOLAM HCL 2 MG/2ML IJ SOLN
INTRAMUSCULAR | Status: AC
  Filled 2022-11-20: qty 2

## 2022-11-20 MED ORDER — TICAGRELOR 90 MG PO TABS
90.0000 mg | ORAL_TABLET | Freq: Two times a day (BID) | ORAL | Status: DC
Start: 2022-11-20 — End: 2022-11-21
  Administered 2022-11-20 – 2022-11-21 (×2): 90 mg via ORAL
  Filled 2022-11-20 (×2): qty 1

## 2022-11-20 MED ORDER — LABETALOL HCL 5 MG/ML IV SOLN
INTRAVENOUS | Status: DC | PRN
  Administered 2022-11-20: 10 mg via INTRAVENOUS

## 2022-11-20 MED ORDER — FENTANYL CITRATE (PF) 100 MCG/2ML IJ SOLN
INTRAMUSCULAR | Status: DC | PRN
  Administered 2022-11-20 (×2): 25 ug via INTRAVENOUS

## 2022-11-20 MED ORDER — SODIUM CHLORIDE 0.9 % IV SOLN
INTRAVENOUS | Status: AC
Start: 2022-11-20 — End: 2022-11-20

## 2022-11-20 MED ORDER — SODIUM CHLORIDE 0.9% FLUSH: 3.0000 mL | INTRAVENOUS | Status: DC | PRN

## 2022-11-20 MED ORDER — LABETALOL HCL 5 MG/ML IV SOLN
INTRAVENOUS | Status: AC
  Filled 2022-11-20: qty 4

## 2022-11-20 MED ORDER — TICAGRELOR 90 MG PO TABS
ORAL_TABLET | ORAL | Status: AC
  Filled 2022-11-20: qty 2

## 2022-11-20 MED ORDER — LOSARTAN POTASSIUM 25 MG PO TABS
25.0000 mg | ORAL_TABLET | Freq: Every day | ORAL | Status: DC
  Administered 2022-11-21: 25 mg via ORAL
  Filled 2022-11-20: qty 1

## 2022-11-20 MED ORDER — ROSUVASTATIN CALCIUM 5 MG PO TABS
5.0000 mg | ORAL_TABLET | Freq: Every day | ORAL | Status: DC
  Administered 2022-11-21: 5 mg via ORAL
  Filled 2022-11-20: qty 1

## 2022-11-20 MED ORDER — SODIUM CHLORIDE 0.9 % WEIGHT BASED INFUSION
50.0000 mL/h | INTRAVENOUS | Status: DC
Start: 2022-11-20 — End: 2022-11-20

## 2022-11-20 MED ORDER — NITROGLYCERIN 1 MG/10 ML FOR IR/CATH LAB
INTRA_ARTERIAL | Status: AC
  Filled 2022-11-20: qty 10

## 2022-11-20 MED ORDER — HEPARIN (PORCINE) IN NACL 1000-0.9 UT/500ML-% IV SOLN
INTRAVENOUS | Status: AC
  Filled 2022-11-20: qty 1000

## 2022-11-20 SURGICAL SUPPLY — 22 items
BALLN EUPHORA RX 2.5X15 (BALLOONS) ×1
BALLN ~~LOC~~ TREK NEO RX 2.75X15 (BALLOONS) ×1
BALLOON EUPHORA RX 2.5X15 (BALLOONS) IMPLANT
BALLOON ~~LOC~~ TREK NEO RX 2.75X15 (BALLOONS) IMPLANT
CATH 5FR JL3.5 JR4 ANG PIG MP (CATHETERS) IMPLANT
CATH LAUNCHER 6FR EBU 3 (CATHETERS) IMPLANT
CATH LAUNCHER 6FR EBU3.5 (CATHETERS) IMPLANT
DEVICE RAD TR BAND REGULAR (VASCULAR PRODUCTS) IMPLANT
DRAPE BRACHIAL (DRAPES) IMPLANT
GLIDESHEATH SLEND SS 6F .021 (SHEATH) IMPLANT
GUIDEWIRE INQWIRE 1.5J.035X260 (WIRE) IMPLANT
INQWIRE 1.5J .035X260CM (WIRE) ×1
KIT ENCORE 26 ADVANTAGE (KITS) IMPLANT
PACK CARDIAC CATH (CUSTOM PROCEDURE TRAY) ×1 IMPLANT
PROTECTION STATION PRESSURIZED (MISCELLANEOUS) ×1
SET ATX-X65L (MISCELLANEOUS) IMPLANT
STATION PROTECTION PRESSURIZED (MISCELLANEOUS) IMPLANT
STENT ONYX FRONTIER 2.5X22 (Permanent Stent) IMPLANT
STENT ONYX FRONTIER 2.5X26 (Permanent Stent) IMPLANT
TUBING CIL FLEX 10 FLL-RA (TUBING) IMPLANT
WIRE RUNTHROUGH .014X180CM (WIRE) IMPLANT
WIRE RUNTHROUGH IZANAI 014 180 (WIRE) IMPLANT

## 2022-11-20 NOTE — Plan of Care (Signed)

## 2022-11-20 NOTE — Progress Notes (Signed)
PHARMACY - ANTICOAGULATION CONSULT NOTE  Pharmacy Consult for Heparin Infusion Indication: chest pain/ACS  Allergies  Allergen Reactions   Meloxicam Other (See Comments)    Hypertension, tachycardia   Statins Other (See Comments)    "Muscle weakness"   Sulfa Drugs Cross Reactors    Zostavax [Zoster Vaccine Live]     Bad reaction     Patient Measurements: Height: 5\' 6"  (167.6 cm) Weight: 97.6 kg (215 lb 2.7 oz) IBW/kg (Calculated) : 59.3 Heparin Dosing Weight: 81.2 kg  Vital Signs: Temp: 97.6 F (36.4 C) (11/04 0414) Temp Source: Oral (11/04 0004) BP: 110/78 (11/04 0414) Pulse Rate: 85 (11/04 0414)  Labs: Recent Labs    11/17/22 1002 11/17/22 1646 11/17/22 1812 11/17/22 1812 11/18/22 0301 11/18/22 1214 11/18/22 2020 11/19/22 0531 11/20/22 0606  HGB  --   --  13.4   < > 13.3  --   --  13.0 14.5  HCT  --   --  40.1   < > 38.8  --   --  37.7 42.4  PLT  --   --  359   < > 348  --   --  354 378  APTT  --   --  25  --   --   --   --   --   --   LABPROT  --   --  14.3  --   --   --   --   --   --   INR  --   --  1.1  --   --   --   --   --   --   HEPARINUNFRC  --   --   --   --  0.18*   < > 0.34 0.34 0.25*  CREATININE 0.76 0.81  --   --   --   --   --   --   --   CKTOTAL 118  --   --   --   --   --   --   --   --   CKMB 3.0  --   --   --   --   --   --   --   --   TROPONINIHS  --  1,560* 1,483*  --   --   --   --   --   --    < > = values in this interval not displayed.    Estimated Creatinine Clearance: 67.4 mL/min (by C-G formula based on SCr of 0.81 mg/dL).   Medical History: Past Medical History:  Diagnosis Date   Asthma    Asthma due to environmental allergies    grass, mold, trees, dust   Cataract    Cellulitis    LEFT LEG   Diabetes mellitus    CONTROLLED ON DIET ALONE   Edema leg    LEFT LEG...CHRONIC   Endometrial hyperplasia    External hemorrhoid    Fatty liver    Glaucoma    Hepatic cyst    STABLE PER 05/20/2008 ULTRASOUND    Hypertension    borderline...controlled since taking herself of HCTZ IN JULY   IBS (irritable bowel syndrome)    Murmur, cardiac    Neuropathy    OSA on CPAP    Osteoarthritis    Psoriasis    Sleep apnea    CPAP   Assessment: [name] is a 77 y.o. female presenting with elevated hsTrop. PMH significant for T2DM, OSA,  psoriasis (immunosuppressed on Ilumya), PAD, HLD. Patient was not on [facility] PTA per chart review. Cardiology planning [facility] 11/4. Pharmacy has been consulted to initiate and manage heparin infusion.   Baseline Labs: aPTT 25, PT 14.3, INR 1.1, Hgb 13.4, Hct 40.1, Plt 359   Goal of Therapy:  Heparin level 0.3-0.7 units/ml Monitor platelets by anticoagulation protocol: Yes   Date Time HL Rate/Comment  11/2 0301 0.18 1000/subtherapeutic 11/2 1214 0.44 1250/therapeutic x1 11/2 2020 0.34 1250/therapeutic x2 11/03 0531 0.34 1250/therapeutic x 3 11/04 0606 0.25 Subtherapeutic  Plan:  Bolus 1200 units x 1 Increase heparin infusion to 1400 units/hr Recheck HL in 8 hrs after rate change Continue to monitor H&H and platelets daily while on heparin infusion   Otelia Sergeant, PharmD, [facility] 11/20/2022 6:34 AM

## 2022-11-20 NOTE — Progress Notes (Signed)
PROGRESS NOTE    [name]   KGM:[id number] DOB: [birth year]  DOA: 11/17/2022 Date of Service: 11/20/22 which is hospital day 3  PCP: Sherlene Shams, MD    HPI: [name] is a 77 y.o. 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  She initially presented with a 3-day history of recurrent substernal chest pain radiating to jaw, both shoulders and left arm, improved with deep breathing.  Last nuclear stress test was in 2017.  By arrival to the ED she denied any of the symptoms.   Hospital course / significant events:  11/01: to ED. Troponin 1228 outpatient --> 1568 in ED --> 1483 on repeat. ST depression in lateral leads. CTA chest no PE, mild cardiac enlargement with prominent coronary artery calcifications. Cardiologist that did not think patient was having a STEMI. Admitted to hospitalist service on heparin gtt for NSTEMI / completed MI 11/02: remaining on heparin thru the weekend pending cardiac cath Mon 11/04. 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  EKG (+)new septal infarct with ST depression CTA chest (+)coronary artery calcifications S/p cardiac cath /today w/ PCI  Cardiology following  aspirin  Metoprolol ARB Plan add SGLT2i following cath Hx intolerance to atorvastatin w/ myalgias, will trial rosuvastatin - consider PCSK9i outpatient  Cardiology  following  LHC and stent today, discharge readiness per cardiology likely for tomorrow    Diabetes mellitus without complication A1C 7.1 - reasonably well controlled  Declining insulin, stopped this rx    Asthma Seasonal allergies OSA on CPAP Ventolin as needed Declining loratadine as substitution for cetirizine   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 Patient on Ilumya injection every 3 months   Declining labs  Declines labs that are not absolutely needed - orders dc  Counseled will need to continue heparin levels     DVT prophylaxis: on heparin IV fluids: no continuous IV fluids  Nutrition: cardiac diet Central lines / invasive devices: none  Code Status: FULL CODE  ACP documentation reviewed: none on file in [facility]  TOC needs: none at this time Barriers to dispo / significant pending items: cath today, hopefully d/c tomorrow if stable               Subjective / Brief ROS:  Patient reports feels fine at this time, no concerns  Examined in recovery following cath No chest pain Pain controlled.  Denies new weakness. .    Family Communication: none at this time     Objective Findings:  Vitals:   11/20/22 1315 11/20/22 1330 11/20/22 1418 11/20/22 1541  BP: 133/77 (!) 140/81 (!) 154/81 (!) 146/72  Pulse: 86 88 (!) 106 97  Resp: 19 20 18    Temp:    98.4 F (36.9 C)  TempSrc:      SpO2: 97% 98% 98% 96%  Weight:      Height:  No intake or output data in the 24 hours ending 11/20/22 1552  Filed Weights   11/18/22 1800  Weight: 97.6 kg    Examination:  Physical Exam Constitutional:      General: She is not in acute distress.    Appearance: She is not ill-appearing.  Cardiovascular:     Rate and Rhythm: Normal rate and regular rhythm.  Pulmonary:     Effort: Pulmonary effort is normal.     Breath sounds: Normal breath sounds.  Skin:    General: Skin is warm and dry.  Neurological:     General: No focal  deficit present.     Mental Status: She is alert and oriented to person, place, and time. Mental status is at baseline.  Psychiatric:        Mood and Affect: Mood normal.        Behavior: Behavior normal.          Scheduled Medications:   aspirin EC  81 mg Oral Daily   carvedilol  6.25 mg Oral BID WC   latanoprost  1 drop Both Eyes QHS   [START ON 11/21/2022] losartan  25 mg Oral Daily   rosuvastatin  5 mg Oral Daily   sodium chloride flush  3 mL Intravenous Q12H   ticagrelor  90 mg Oral BID    Continuous Infusions:  sodium chloride      PRN Medications:  sodium chloride, acetaminophen, albuterol, ALPRAZolam, guaiFENesin-dextromethorphan, labetalol, nitroGLYCERIN, ondansetron (ZOFRAN) IV, sodium chloride flush  Antimicrobials from admission:  Anti-infectives (From admission, onward)    None           Data Reviewed:  I have personally reviewed the following...  CBC: Recent Labs  Lab 11/17/22 1812 11/18/22 0301 11/19/22 0531 11/20/22 0606  WBC 16.3* 12.8* 12.3* 11.9*  HGB 13.4 13.3 13.0 14.5  HCT 40.1 38.8 37.7 42.4  MCV 88.3 87.0 85.9 86.7  PLT 359 348 354 378   Basic Metabolic Panel: Recent Labs  Lab 11/17/22 1002 11/17/22 1646  NA 137 136  K 4.3 3.9  CL 103 103  CO2 23 24  GLUCOSE 139* 143*  BUN 14 17  CREATININE 0.76 0.81  CALCIUM 9.1 8.6*   GFR: Estimated Creatinine Clearance: 67.4 mL/min (by C-G formula based on SCr of 0.81 mg/dL). Liver Function Tests: Recent Labs  Lab 11/17/22 1002  AST 26  ALT 20  ALKPHOS 76  BILITOT 1.5*  PROT 7.1  ALBUMIN 3.9   No results for input(s): "LIPASE", "AMYLASE" in the last 168 hours. No results for input(s): "AMMONIA" in the last 168 hours. Coagulation Profile: Recent Labs  Lab 11/17/22 1812  INR 1.1   Cardiac Enzymes: Recent Labs  Lab 11/17/22 1002  CKTOTAL 118  CKMB 3.0   BNP (last 3 results) No results for input(s): "PROBNP" in the last 8760 hours. HbA1C: No results for  input(s): "HGBA1C" in the last 72 hours. CBG: Recent Labs  Lab 11/17/22 2012 11/17/22 2304 11/18/22 0420 11/18/22 1152 11/20/22 0834  GLUCAP 133* 182* 149* 133* 123*   Lipid Profile: No results for input(s): "CHOL", "HDL", "LDLCALC", "TRIG", "CHOLHDL", "LDLDIRECT" in the last 72 hours. Thyroid Function Tests: No results for input(s): "TSH", "T4TOTAL", "FREET4", "T3FREE", "THYROIDAB" in the last 72 hours. Anemia Panel: No results for input(s): "VITAMINB12", "FOLATE", "FERRITIN", "TIBC", "IRON", "RETICCTPCT" in the last 72 hours. Most Recent Urinalysis On File:     Component Value Date/Time   COLORURINE YELLOW 03/14/2022 1407   APPEARANCEUR Sl Cloudy (A)  03/14/2022 1407   LABSPEC 1.020 03/14/2022 1407   PHURINE 6.0 03/14/2022 1407   GLUCOSEU NEGATIVE 03/14/2022 1407   HGBUR TRACE-INTACT (A) 03/14/2022 1407   BILIRUBINUR NEGATIVE 03/14/2022 1407   BILIRUBINUR 1+ 11/30/2021 1001   KETONESUR NEGATIVE 03/14/2022 1407   PROTEINUR neg 10/15/2014 0952   PROTEINUR 30 (A) 07/27/2007 1730   UROBILINOGEN 0.2 03/14/2022 1407   NITRITE POSITIVE (A) 03/14/2022 1407   LEUKOCYTESUR NEGATIVE 03/14/2022 1407   Sepsis Labs: @LABRCNTIP (procalcitonin:4,lacticidven:4) Microbiology: No results found for this or any previous visit (from the past 240 hour(s)).    Radiology Studies last 3 days: CARDIAC CATHETERIZATION  Result Date: 11/20/2022   Prox RCA lesion is 30% stenosed.   Dist RCA lesion is 70% stenosed with 90% stenosed side branch in RPDA.   Prox LAD to Mid LAD lesion is 30% stenosed.   1st Diag lesion is 50% stenosed.   Mid LAD lesion is 40% stenosed.   Mid LAD to Dist LAD lesion is 30% stenosed.   Ost LM lesion is 30% stenosed.   1st Mrg lesion is 99% stenosed.   Prox Cx to Mid Cx lesion is 90% stenosed.   A drug-eluting stent was successfully placed using a STENT ONYX FRONTIER 2.5X26.   A drug-eluting stent was successfully placed using a STENT ONYX FRONTIER 2.5X22.   Post intervention,  there is a 0% residual stenosis.   Post intervention, there is a 0% residual stenosis. 1.  Diffuse and moderately calcified coronary arteries with severe two-vessel coronary artery disease.  The culprit for myocardial infarction seems to be plaque rupture and OM1 and mid left circumflex.  In addition, there is severe stenosis in the distal RCA extending into the ostium of the right PDA.  The LAD has moderate disease in multiple areas. 2.  Left ventricular angiography was not performed.  EF was moderately reduced by echo.  LVEDP was mildly elevated. 3.  Successful complex bifurcation angioplasty and kissing stent placement to OM1/mid left circumflex using 2 drug-eluting stents. Recommendations: Dual antiplatelet therapy for at least 12 months and preferably longer. The patient is intolerant to statins but will try small dose rosuvastatin 5 mg daily. I switch Toprol to carvedilol for better blood pressure control. Recommend staged RCA PCI in few weeks.   ECHOCARDIOGRAM COMPLETE  Result Date: 11/18/2022    ECHOCARDIOGRAM REPORT   Patient Name:   [name] Date of Exam: 11/18/2022 Medical Rec #:  [id number]     Height:       66.0 in Accession #:    [id number]    Weight:       215.1 lb Date of Birth:  1945-10-18      BSA:          2.063 m Patient Age:    77 years      BP:           156/92 mmHg Patient Gender: F             HR:           94 bpm. Exam Location:  [facility] Procedure: 2D Echo, Cardiac Doppler, Color Doppler and Intracardiac            Opacification Agent Indications:    NSTEMI  History:        Patient has no prior history of Echocardiogram examinations.                 PAD, Signs/Symptoms:Chest Pain; Risk Factors:Hypertension,  Diabetes, Dyslipidemia, Former Smoker and Sleep Apnea.  Sonographer:    Dondra Prader RVT RCS Referring Phys: [id number] Andris Baumann  Sonographer Comments: Technically difficult study due to poor echo windows, suboptimal parasternal window, suboptimal apical window,  suboptimal subcostal window and patient is obese. Image acquisition challenging due to patient body habitus. IMPRESSIONS  1. Left ventricular ejection fraction, by estimation, is 35 to 40%. The left ventricle has moderately decreased function. The left ventricle demonstrates regional wall motion abnormalities (see scoring diagram/findings for description). There is mild concentric left ventricular hypertrophy. Left ventricular diastolic parameters are indeterminate. There is mild hypokinesis of the left ventricular, entire inferoseptal wall and inferior wall. There is moderate hypokinesis of the left ventricular, entire  anterolateral wall, inferolateral wall and anterior wall.  2. Right ventricular systolic function is normal. The right ventricular size is not well visualized. There is normal pulmonary artery systolic pressure.  3. The mitral valve is normal in structure. Mild mitral valve regurgitation. No evidence of mitral stenosis.  4. The aortic valve was not well visualized. There is moderate calcification of the aortic valve. There is mild thickening of the aortic valve. Aortic valve regurgitation is not visualized. Mild aortic valve stenosis.  5. The inferior vena cava is normal in size with greater than 50% respiratory variability, suggesting right atrial pressure of 3 mmHg. Comparison(s): No prior Echocardiogram. Conclusion(s)/Recommendation(s): Very difficult to see wall motion, even with use of echo contrast. Moderately reduced LVEF, anterior wall not well visualized, in contrast images lateral wall appears slightly more hypokinetic than other walls, but images  are poor sensitivity for wall motion. FINDINGS  Left Ventricle: Left ventricular ejection fraction, by estimation, is 35 to 40%. The left ventricle has moderately decreased function. The left ventricle demonstrates regional wall motion abnormalities. Mild hypokinesis of the left ventricular, entire inferoseptal wall and inferior wall. Moderate  hypokinesis of the left ventricular, entire anterolateral wall, inferolateral wall and anterior wall. Definity contrast agent was given IV to delineate the left ventricular endocardial borders. The left ventricular internal cavity size was normal in size. There is mild concentric left ventricular hypertrophy. Left ventricular diastolic parameters are indeterminate. Right Ventricle: The right ventricular size is not well visualized. Right vetricular wall thickness was not well visualized. Right ventricular systolic function is normal. There is normal pulmonary artery systolic pressure. The tricuspid regurgitant velocity is 1.86 m/s, and with an assumed right atrial pressure of 3 mmHg, the estimated right ventricular systolic pressure is 16.8 mmHg. Left Atrium: Left atrial size was normal in size. Right Atrium: Right atrial size was not well visualized. Pericardium: There is no evidence of pericardial effusion. Mitral Valve: The mitral valve is normal in structure. Mild mitral valve regurgitation. No evidence of mitral valve stenosis. Tricuspid Valve: The tricuspid valve is normal in structure. Tricuspid valve regurgitation is trivial. No evidence of tricuspid stenosis. Aortic Valve: The aortic valve was not well visualized. There is moderate calcification of the aortic valve. There is mild thickening of the aortic valve. Aortic valve regurgitation is not visualized. Mild aortic stenosis is present. Aortic valve mean gradient measures 8.5 mmHg. Aortic valve peak gradient measures 15.1 mmHg. Aortic valve area, by VTI measures 1.07 cm. Pulmonic Valve: The pulmonic valve was not well visualized. Pulmonic valve regurgitation is not visualized. No evidence of pulmonic stenosis. Aorta: The aortic root, ascending aorta, aortic arch and descending aorta are all structurally normal, with no evidence of dilitation or obstruction. Venous: The inferior vena cava is normal in size with  greater than 50% respiratory variability,  suggesting right atrial pressure of 3 mmHg. IAS/Shunts: The interatrial septum was not well visualized.  LEFT VENTRICLE PLAX 2D LVIDd:         4.75 cm   Diastology LVIDs:         3.55 cm   LV e' medial:    6.85 cm/s LV PW:         1.25 cm   LV E/e' medial:  16.1 LV IVS:        1.15 cm   LV e' lateral:   6.53 cm/s LVOT diam:     1.90 cm   LV E/e' lateral: 16.8 LV SV:         35 LV SV Index:   17 LVOT Area:     2.84 cm  RIGHT VENTRICLE             IVC RV S prime:     12.10 cm/s  IVC diam: 1.60 cm TAPSE (M-mode): 2.0 cm LEFT ATRIUM             Index LA diam:        2.80 cm 1.36 cm/m LA Vol (A2C):   43.1 ml 20.89 ml/m LA Vol (A4C):   55.4 ml 26.86 ml/m LA Biplane Vol: 50.6 ml 24.53 ml/m  AORTIC VALVE                     PULMONIC VALVE AV Area (Vmax):    1.24 cm      PV Vmax:       1.24 m/s AV Area (Vmean):   1.19 cm      PV Peak grad:  6.2 mmHg AV Area (VTI):     1.07 cm AV Vmax:           194.50 cm/s AV Vmean:          135.500 cm/s AV VTI:            0.324 m AV Peak Grad:      15.1 mmHg AV Mean Grad:      8.5 mmHg LVOT Vmax:         85.00 cm/s LVOT Vmean:        56.900 cm/s LVOT VTI:          0.123 m LVOT/AV VTI ratio: 0.38  AORTA Ao Root diam: 2.30 cm Ao Asc diam:  2.80 cm MITRAL VALVE                TRICUSPID VALVE MV Area (PHT): 6.27 cm     TR Peak grad:   13.8 mmHg MV Decel Time: 121 msec     TR Vmax:        186.00 cm/s MV E velocity: 110.00 cm/s MV A velocity: 103.00 cm/s  SHUNTS MV E/A ratio:  1.07         Systemic VTI:  0.12 m                             Systemic Diam: 1.90 cm Jodelle Red MD Electronically signed by Jodelle Red MD Signature Date/Time: 11/18/2022/12:16:38 PM    Final    CT Angio Chest PE W/Cm &/Or Wo Cm  Result Date: 11/17/2022 CLINICAL DATA:  Pulmonary embolus suspected with high probability. Elevated troponin levels. EXAM: CT ANGIOGRAPHY CHEST WITH CONTRAST TECHNIQUE: Multidetector CT imaging of the chest was performed using the standard protocol during bolus  administration  of intravenous contrast. Multiplanar CT image reconstructions and MIPs were obtained to evaluate the vascular anatomy. RADIATION DOSE REDUCTION: This exam was performed according to the departmental dose-optimization program which includes automated exposure control, adjustment of the mA and/or kV according to patient size and/or use of iterative reconstruction technique. CONTRAST:  65mL OMNIPAQUE IOHEXOL 350 MG/ML SOLN COMPARISON:  Chest radiograph 11/17/2022 FINDINGS: Cardiovascular: Technically adequate study with good opacification of the central and segmental pulmonary arteries. Moderate motion artifact. No focal filling defects. No evidence of significant pulmonary embolus. Mild cardiac enlargement. No pericardial effusions. Normal caliber thoracic aorta. Few scattered calcifications in the aorta. Prominent coronary artery calcification. Mediastinum/Nodes: No enlarged mediastinal, hilar, or axillary lymph nodes. Thyroid gland, trachea, and esophagus demonstrate no significant findings. Lungs/Pleura: Mild dependent atelectasis in the lung bases. No airspace disease or consolidation. No pleural effusions. No pneumothorax. Upper Abdomen: No acute abnormalities. Musculoskeletal: Degenerative changes in the spine. No acute bony abnormalities. Review of the MIP images confirms the above findings. IMPRESSION: 1. No evidence of significant pulmonary embolus. 2. Cardiac enlargement with prominent coronary artery calcifications. 3. Mild dependent atelectasis in the lungs. No evidence of active pulmonary disease. Electronically Signed   By: Burman Nieves M.D.   On: 11/17/2022 18:44   DG Chest 2 View  Result Date: 11/17/2022 CLINICAL DATA:  Elevated troponin EXAM: CHEST - 2 VIEW COMPARISON:  03/05/2018 FINDINGS: Mild cardiac enlargement. No vascular congestion, edema, or consolidation. Blunting of the costophrenic angles suggests small bilateral pleural effusions. No pneumothorax. Mediastinal  contours appear intact. Degenerative changes in the spine. IMPRESSION: 1. Cardiac enlargement. 2. Small bilateral pleural effusions. 3. Lungs are otherwise clear. Electronically Signed   By: Burman Nieves M.D.   On: 11/17/2022 18:41        Sunnie Nielsen, DO [facility] 11/20/2022, 3:52 PM    Dictation software may have been used to generate the above note. Typos may occur and escape review in typed/dictated notes. Please contact Dr Lyn Hollingshead directly for clarity if needed.  Staff may message me via secure chat in Epic  but this may not receive an immediate response,  please page me for urgent matters!  If 7PM-7AM, please contact night coverage [email or web address]

## 2022-11-20 NOTE — Care Management Important Message (Signed)
Important Message  Patient Details  Name: [name] MRN: [id number] Date of Birth: Apr 13, 1945   Important Message Given:  N/A - LOS <3 / Initial given by admissions     Olegario Messier A Cordarro Spinnato 11/20/2022, 2:44 PM

## 2022-11-20 NOTE — TOC Benefit Eligibility Note (Signed)
Patient Product/process development scientist completed.    The patient is insured through [facility]. Patient has Medicare and is not eligible for a copay card, but may be able to apply for patient assistance, if available.    Ran test claim for Brilinta 90 mg and the current 30 day co-pay is $40.00.  Ran test claim for Repatha Sureclick 140 mg/ml and Requires Prior Authorization  This test claim was processed through [facility]- copay amounts may vary at other pharmacies due to [employer], or as the patient moves through the different stages of their insurance plan.     Roland Earl, CPHT Pharmacy Technician III Certified Patient Advocate [facility] Patient Advocate Team Direct Number: [phone]  Fax: [phone]

## 2022-11-20 NOTE — TOC CM/SW Note (Signed)
Transition of Care [facility]) - Inpatient Brief Assessment   Patient Details  Name: [name] MRN: [id number] Date of Birth: 12-Nov-1945  Transition of Care [facility]) CM/SW Contact:    Margarito Liner, LCSW Phone Number: 11/20/2022, 3:28 PM   Clinical Narrative: CSW reviewed chart. No TOC needs identified so far. CSW will continue to follow progress. Please place [facility] Main consult if any needs arise.  Transition of Care Asessment: Insurance and Status: Insurance coverage has been reviewed Patient has primary care physician: Yes Home environment has been reviewed: Single family home Prior level of function:: Not documented Prior/Current Home Services: No current home services Social Determinants of Health Reivew: SDOH reviewed no interventions necessary Readmission risk has been reviewed: Yes Transition of care needs: no transition of care needs at this time

## 2022-11-20 NOTE — Telephone Encounter (Signed)
Pt is currently admitted to the hospital.

## 2022-11-20 NOTE — Progress Notes (Incomplete)
Heart Failure Stewardship Pharmacy Note  PCP: Sherlene Shams, MD PCP-Cardiologist: None  HPI: [name] is a 77 y.o. 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 EKG on admission with ST depression in lateral leads. CXR showed small bilateral pleural effusions. CTA negative for PE. Troponin on admission was 1228, trending up to 1560, then down to 1483. TTE this admission showed LVEF of 35-40%, mild AS, mild MR.  Pertinent cardiac history: Stress test 07/2015 showed LVEF of 63% and did not identify ischemia.   Pertinent Lab Values: Creatinine, Ser  Date Value Ref Range Status  11/17/2022 0.81 0.44 - 1.00 mg/dL Final   BUN  Date Value Ref Range Status  11/17/2022 17 8 - 23 mg/dL Final  16/10/9602 12 4 - 21 mg/dL    Potassium  Date Value Ref Range Status  11/17/2022 3.9 3.5 - 5.1 mmol/L Final   Sodium  Date Value Ref Range Status  11/17/2022 136 135 - 145 mmol/L Final  10/18/2009 142 137 - 147 mmol/L    Magnesium  Date Value Ref Range Status  03/19/2017 2.0 1.5 - 2.5 mg/dL Final   Hgb V4U MFr Bld  Date Value Ref Range Status  11/13/2022 7.1 (H) 4.6 - 6.5 % Final    Comment:    Glycemic Control Guidelines for People with Diabetes:Non Diabetic:  <6%Goal of Therapy: <7%Additional Action Suggested:  >8%    TSH  Date Value Ref Range Status  11/13/2022 3.43 0.35 - 5.50 uIU/mL Final    Vital Signs: Admission weight: Temp:  [97.5 F (36.4 C)-98.6 F (37 C)] 97.8 F (36.6 C) (11/04 0730) Pulse Rate:  [75-88] 75 (11/04 0730) Cardiac Rhythm: Normal sinus rhythm (11/04 0700) Resp:  [16-20] 17 (11/04 0414) BP: (110-156)/(73-91) 156/82 (11/04 0730) SpO2:  [98 %-100 %] 99 % (11/04  0730) No intake or output data in the 24 hours ending 11/20/22 0818  Current Heart Failure Medications:  Loop diuretic: none Beta-Blocker: metoprolol succinate 25 mg daily ACEI/ARB/ARNI: losartan 12.5 mg daily MRA: none SGLT2i: none  Prior to admission Heart Failure Medications:  Loop diuretic: none Beta-Blocker: none ACEI/ARB/ARNI: telmisartan 10 mg daily MRA: none SGLT2i: none   Assessment: 1. Acute systolic heart failure (LVEF 35-40%) ***, due to ***. NYHA class *** symptoms.   - Plan: 1) Medication changes recommended at this time:  2) Patient assistance:   3) Education: -To be completed prior to discharge.  *** Medication Assistance / Insurance Benefits Check: Does the patient have prescription insurance?    Type of insurance plan:  Does the patient qualify for medication assistance through manufacturers or grants? {CHL AMB Yes/No/Pending:[id number]}  Eligible grants and/or patient assistance programs: ***  Medication assistance applications in progress: ***  Medication assistance applications approved: *** Approved medication assistance renewals will be completed by: ***  Outpatient Pharmacy: Prior to admission outpatient pharmacy: ***      ***

## 2022-11-20 NOTE — Interval H&P Note (Signed)
History and Physical Interval Note:  11/20/2022 9:34 AM  [name]  has presented today for surgery, with the diagnosis of Non-ST segment myocardial infarction.  The various methods of treatment have been discussed with the patient and family. After consideration of risks, benefits and other options for treatment, the patient has consented to  Procedure(s): LEFT HEART CATH AND CORONARY ANGIOGRAPHY (N/A) as a surgical intervention.  The patient's history has been reviewed, patient examined, no change in status, stable for surgery.  I have reviewed the patient's chart and labs.  Questions were answered to the patient's satisfaction.     Lorine Bears

## 2022-11-21 ENCOUNTER — Encounter: Payer: Self-pay | Admitting: Oncology

## 2022-11-21 ENCOUNTER — Other Ambulatory Visit: Payer: Self-pay

## 2022-11-21 ENCOUNTER — Encounter: Payer: Self-pay | Admitting: Cardiovascular Disease

## 2022-11-21 DIAGNOSIS — I214 Non-ST elevation (NSTEMI) myocardial infarction: Secondary | ICD-10-CM | POA: Diagnosis not present

## 2022-11-21 LAB — CBC
HCT: 37.2 % (ref 36.0–46.0)
Hemoglobin: 12.8 g/dL (ref 12.0–15.0)
MCH: 29.4 pg (ref 26.0–34.0)
MCHC: 34.4 g/dL (ref 30.0–36.0)
MCV: 85.5 fL (ref 80.0–100.0)
Platelets: 348 10*3/uL (ref 150–400)
RBC: 4.35 MIL/uL (ref 3.87–5.11)
RDW: 12.8 % (ref 11.5–15.5)
WBC: 11.1 10*3/uL — ABNORMAL HIGH (ref 4.0–10.5)
nRBC: 0 % (ref 0.0–0.2)

## 2022-11-21 LAB — BASIC METABOLIC PANEL
Anion gap: 11 (ref 5–15)
BUN: 15 mg/dL (ref 8–23)
CO2: 22 mmol/L (ref 22–32)
Calcium: 8.6 mg/dL — ABNORMAL LOW (ref 8.9–10.3)
Chloride: 102 mmol/L (ref 98–111)
Creatinine, Ser: 0.74 mg/dL (ref 0.44–1.00)
GFR, Estimated: >60 mL/min (ref >60)
Glucose, Bld: 134 mg/dL — ABNORMAL HIGH (ref 70–99)
Potassium: 3.6 mmol/L (ref 3.5–5.1)
Sodium: 135 mmol/L (ref 135–145)

## 2022-11-21 MED ORDER — TICAGRELOR 90 MG PO TABS
90.0000 mg | ORAL_TABLET | Freq: Two times a day (BID) | ORAL | 0 refills | Status: DC
  Filled 2022-11-21: qty 60, 30d supply, fill #0

## 2022-11-21 MED ORDER — ROSUVASTATIN CALCIUM 5 MG PO TABS
5.0000 mg | ORAL_TABLET | Freq: Every day | ORAL | 0 refills | Status: DC
  Filled 2022-11-21: qty 30, 30d supply, fill #0

## 2022-11-21 MED ORDER — CARVEDILOL 6.25 MG PO TABS
6.2500 mg | ORAL_TABLET | Freq: Two times a day (BID) | ORAL | 0 refills | Status: DC
  Filled 2022-11-21: qty 60, 30d supply, fill #0

## 2022-11-21 MED ORDER — NITROGLYCERIN 0.4 MG SL SUBL
0.4000 mg | SUBLINGUAL_TABLET | SUBLINGUAL | 0 refills | Status: AC | PRN
  Filled 2022-11-21: qty 25, 25d supply, fill #0

## 2022-11-21 MED ORDER — ASPIRIN 81 MG PO TBEC
81.0000 mg | DELAYED_RELEASE_TABLET | Freq: Every day | ORAL | 12 refills | Status: DC
  Filled 2022-11-21: qty 30, 30d supply, fill #0
  Filled 2022-12-19: qty 30, 30d supply, fill #1
  Filled 2023-01-15: qty 30, 30d supply, fill #2

## 2022-11-21 MED ORDER — LOSARTAN POTASSIUM 25 MG PO TABS
25.0000 mg | ORAL_TABLET | Freq: Every day | ORAL | 0 refills | Status: DC
  Filled 2022-11-21: qty 30, 30d supply, fill #0

## 2022-11-21 NOTE — Discharge Summary (Signed)
Physician Discharge Summary   Patient: [name] MRN: [id number]  DOB: 1945-04-02   Admit:     Date of Admission: 11/17/2022 Admitted from: home   Discharge: Date of discharge: 11/21/22 Disposition: Home Condition at discharge: good  CODE STATUS: FULL CODE     Discharge Physician: Sunnie Nielsen, DO [facility]     PCP: Sherlene Shams, MD  Recommendations for Outpatient Follow-up:  Follow up with PCP Sherlene Shams, MD in 2-4 weeks Follow up with cardiology Dr Kirke Corin in 1-2 weeks    Discharge Instructions     AMB Referral to Cardiac Rehabilitation - Phase II   Complete by: As directed    Diagnosis:  NSTEMI Coronary Stents     After initial evaluation and assessments completed: Virtual Based Care may be provided alone or in conjunction with Phase 2 Cardiac Rehab based on patient barriers.: Yes   Intensive Cardiac Rehabilitation (ICR) [facility] location only OR Traditional Cardiac Rehabilitation (TCR) *If criteria for ICR are not met will enroll in TCR [facility] only): Yes   Diet - low sodium heart healthy   Complete by: As directed    Increase activity slowly   Complete by: As directed          Discharge Diagnoses: Principal Problem:   NSTEMI (non-ST elevated myocardial infarction) (HCC) Active Problems:   Psoriasis   OSA on CPAP   Plaque psoriasis   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  She initially presented with a 3-day history of recurrent substernal chest pain radiating to jaw, both shoulders and left arm, improved with deep  breathing.  Last nuclear stress test was in 2017.  By arrival to the ED she denied any of the symptoms.   Hospital course / significant events:  11/01: to ED. Troponin 1228 outpatient --> 1568 in ED --> 1483 on repeat. ST depression in lateral leads. CTA chest no PE, mild cardiac enlargement with prominent coronary artery calcifications. No STEMI. Admitted to hospitalist service on heparin gtt 11/02-11/03: remaining on heparin thru the weekend pending cardiac cath Mon 11/04. 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  S/p cardiac cath yesterday w/ PCI  Aspirin, Brilinta  Metoprolol ARB Plan add SGLT2i  Hx intolerance to atorvastatin w/ myalgias, will trial rosuvastatin - consider PCSK9i outpatient  Cardiology following outpatient   Diabetes mellitus without complication A1C 7.1 - reasonably well controlled  Declining insulin, stopped this rx  Follow outpatient    Asthma Seasonal allergies OSA on CPAP Ventolin as needed Declining loratadine as substitution for cetirizine, resume home meds on discharge    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 Patient on Ilumya injection every 3 months  Discharge Instructions  Allergies as of 11/21/2022       Reactions   Meloxicam Other (See Comments)   Hypertension, tachycardia   Statins Other (See Comments)   "Muscle weakness"   Sulfa Drugs Cross Reactors    Zostavax [zoster Vaccine Live]    Bad reaction        Medication List     STOP taking these medications    meloxicam 15 MG tablet Commonly known as: MOBIC   metoprolol succinate 25 MG 24 hr  tablet Commonly known as: TOPROL-XL   telmisartan 20 MG tablet Commonly known as: MICARDIS       TAKE these medications    albuterol 108 (90 Base) MCG/ACT inhaler Commonly known as: VENTOLIN HFA Inhale 2 puffs into the lungs every 6 (six) hours as needed for wheezing or shortness of breath.   ALPRAZolam 0.25 MG tablet Commonly known as: XANAX Take 1 tablet (0.25 mg total) by mouth 2 (two) times daily as needed for anxiety.   aspirin EC 81 MG tablet Take 1 tablet (81 mg total) by mouth daily. Swallow whole. Start taking on: November 22, 2022   budesonide 180 MCG/ACT inhaler Commonly known as: PULMICORT Inhale 2 puffs into the lungs 2 (two) times daily.   carvedilol 6.25 MG tablet Commonly known as: COREG Take 1 tablet (6.25 mg total) by mouth 2 (two) times daily with a meal.   cetirizine 10 MG tablet Commonly known as: ZYRTEC Take 10 mg by mouth daily.   EPINEPHrine 0.3 mg/0.3 mL Soaj injection Commonly known as: EPI-PEN See admin instructions.   losartan 25 MG tablet Commonly known as: COZAAR Take 1 tablet (25 mg total) by mouth daily.   Lumigan 0.01 % Soln Generic drug: bimatoprost Place 1 drop into both eyes at bedtime.   MILK THISTLE PO Take 1 capsule by mouth daily.   nitroGLYCERIN 0.4 MG SL tablet Commonly known as: NITROSTAT Place 1 tablet (0.4 mg total) under the tongue every 5 (five) minutes x 3 doses as needed for chest pain.   ondansetron 4 MG tablet Commonly known as: ZOFRAN Take 1 tablet (4 mg total) by mouth every 8 (eight) hours as needed for nausea or vomiting.   PROBIOTIC COMPLEX ACIDOPHILUS PO Take 1 capsule by mouth daily.   rosuvastatin 5 MG tablet Commonly known as: CRESTOR Take 1 tablet (5 mg total) by mouth daily. Start taking on: November 22, 2022   sodium chloride 0.65 % Soln nasal spray Commonly known as: OCEAN Place 1 spray into the nose as needed.   ticagrelor 90 MG Tabs tablet Commonly known as: BRILINTA Take 1 tablet  (90 mg total) by mouth 2 (two) times daily.   tildrakizumab-asmn 100 MG/ML subcutaneous injection Commonly known as: ILUMYA Inject 100 mg into the skin every 3 (three) months.   triamcinolone cream 0.1 % Commonly known as: KENALOG Apply 1 Application topically 2 (two) times daily. Until itching has resolved         Follow-up Information     Iran Ouch, MD. Schedule an appointment as soon as possible for a visit.   Specialty: Cardiology Why: hospital follow up in 1 week Contact information: [address] [place] [address] [phone]                 Allergies  Allergen Reactions   Meloxicam Other (See Comments)    Hypertension, tachycardia   Statins Other (See Comments)    "Muscle weakness"   Sulfa Drugs  Cross Reactors    Zostavax [employer] Vaccine Live]     Bad reaction      Subjective: pt reports feeling okay today a bit "woozy" but stated cardiology said this may happen w/ new meds. Denies CP/SOB   Discharge Exam: BP 115/68 (BP Location: Left Arm)   Pulse 79   Temp 97.8 F (36.6 C)   Resp 17   Ht 5\' 6"  (1.676 m)   Wt 97.6 kg   SpO2 97%   BMI 34.73 kg/m  General: Pt is alert, awake, not in acute distress Cardiovascular: RRR, S1/S2 +, no rubs, no gallops Respiratory: CTA bilaterally, no wheezing, no rhonchi Abdominal: Soft, NT, ND, bowel sounds + Extremities: no edema, no cyanosis     The results of significant diagnostics from this hospitalization (including imaging, microbiology, ancillary and laboratory) are listed below for reference.     Microbiology: No results found for this or any previous visit (from the past 240 hour(s)).   Labs: BNP (last 3 results) No results for input(s): "BNP" in the last 8760 hours. Basic Metabolic Panel: Recent Labs  Lab 11/17/22 1002 11/17/22 1646 11/21/22 0635  NA 137 136 135  K 4.3 3.9 3.6  CL 103 103 102  CO2 23 24 22   GLUCOSE 139* 143* 134*  BUN 14 17 15    CREATININE 0.76 0.81 0.74  CALCIUM 9.1 8.6* 8.6*   Liver Function Tests: Recent Labs  Lab 11/17/22 1002  AST 26  ALT 20  ALKPHOS 76  BILITOT 1.5*  PROT 7.1  ALBUMIN 3.9   No results for input(s): "LIPASE", "AMYLASE" in the last 168 hours. No results for input(s): "AMMONIA" in the last 168 hours. CBC: Recent Labs  Lab 11/17/22 1812 11/18/22 0301 11/19/22 0531 11/20/22 0606 11/21/22 0635  WBC 16.3* 12.8* 12.3* 11.9* 11.1*  HGB 13.4 13.3 13.0 14.5 12.8  HCT 40.1 38.8 37.7 42.4 37.2  MCV 88.3 87.0 85.9 86.7 85.5  PLT 359 348 354 378 348   Cardiac Enzymes: Recent Labs  Lab 11/17/22 1002  CKTOTAL 118  CKMB 3.0   BNP: Invalid input(s): "POCBNP" CBG: Recent Labs  Lab 11/17/22 2304 11/18/22 0420 11/18/22 1152 11/20/22 0834 11/20/22 1719  GLUCAP 182* 149* 133* 123* 118*   D-Dimer No results for input(s): "DDIMER" in the last 72 hours. Hgb A1c No results for input(s): "HGBA1C" in the last 72 hours. Lipid Profile No results for input(s): "CHOL", "HDL", "LDLCALC", "TRIG", "CHOLHDL", "LDLDIRECT" in the last 72 hours. Thyroid function studies No results for input(s): "TSH", "T4TOTAL", "T3FREE", "THYROIDAB" in the last 72 hours.  Invalid input(s): "FREET3" Anemia work up No results for input(s): "VITAMINB12", "FOLATE", "FERRITIN", "TIBC", "IRON", "RETICCTPCT" in the last 72 hours. Urinalysis    Component Value Date/Time   COLORURINE YELLOW 03/14/2022 1407   APPEARANCEUR Sl Cloudy (A) 03/14/2022 1407   LABSPEC 1.020 03/14/2022 1407   PHURINE 6.0 03/14/2022 1407   GLUCOSEU NEGATIVE 03/14/2022 1407   HGBUR TRACE-INTACT (A) 03/14/2022 1407   BILIRUBINUR NEGATIVE 03/14/2022 1407   BILIRUBINUR 1+ 11/30/2021 1001   KETONESUR NEGATIVE 03/14/2022 1407   PROTEINUR neg 10/15/2014 0952   PROTEINUR 30 (A) 07/27/2007 1730   UROBILINOGEN 0.2 03/14/2022 1407   NITRITE POSITIVE (A) 03/14/2022 1407   LEUKOCYTESUR NEGATIVE 03/14/2022 1407   Sepsis Labs Recent Labs  Lab  11/18/22 0301 11/19/22 0531 11/20/22 0606 11/21/22 0635  WBC 12.8* 12.3* 11.9* 11.1*   Microbiology No results found for this or any previous visit (from the past 240 hour(s)). Imaging  ECHOCARDIOGRAM COMPLETE  Result Date: 11/18/2022    ECHOCARDIOGRAM REPORT   Patient Name:   MORIYAH BYINGTON Date of Exam: 11/18/2022 Medical Rec #:  [id number]     Height:       66.0 in Accession #:    [id number]    Weight:       215.1 lb Date of Birth:  February 19, 1945      BSA:          2.063 m Patient Age:    77 years      BP:           156/92 mmHg Patient Gender: F             HR:           94 bpm. Exam Location:  [facility] Procedure: 2D Echo, Cardiac Doppler, Color Doppler and Intracardiac            Opacification Agent Indications:    NSTEMI  History:        Patient has no prior history of Echocardiogram examinations.                 PAD, Signs/Symptoms:Chest Pain; Risk Factors:Hypertension,                 Diabetes, Dyslipidemia, Former Smoker and Sleep Apnea.  Sonographer:    Dondra Prader RVT RCS Referring Phys: [id number] Andris Baumann  Sonographer Comments: Technically difficult study due to poor echo windows, suboptimal parasternal window, suboptimal apical window, suboptimal subcostal window and patient is obese. Image acquisition challenging due to patient body habitus. IMPRESSIONS  1. Left ventricular ejection fraction, by estimation, is 35 to 40%. The left ventricle has moderately decreased function. The left ventricle demonstrates regional wall motion abnormalities (see scoring diagram/findings for description). There is mild concentric left ventricular hypertrophy. Left ventricular diastolic parameters are indeterminate. There is mild hypokinesis of the left ventricular, entire inferoseptal wall and inferior wall. There is moderate hypokinesis of the left ventricular, entire  anterolateral wall, inferolateral wall and anterior wall.  2. Right ventricular systolic function is normal. The right ventricular size is not well  visualized. There is normal pulmonary artery systolic pressure.  3. The mitral valve is normal in structure. Mild mitral valve regurgitation. No evidence of mitral stenosis.  4. The aortic valve was not well visualized. There is moderate calcification of the aortic valve. There is mild thickening of the aortic valve. Aortic valve regurgitation is not visualized. Mild aortic valve stenosis.  5. The inferior vena cava is normal in size with greater than 50% respiratory variability, suggesting right atrial pressure of 3 mmHg. Comparison(s): No prior Echocardiogram. Conclusion(s)/Recommendation(s): Very difficult to see wall motion, even with use of echo contrast. Moderately reduced LVEF, anterior wall not well visualized, in contrast images lateral wall appears slightly more hypokinetic than other walls, but images  are poor sensitivity for wall motion. FINDINGS  Left Ventricle: Left ventricular ejection fraction, by estimation, is 35 to 40%. The left ventricle has moderately decreased function. The left ventricle demonstrates regional wall motion abnormalities. Mild hypokinesis of the left ventricular, entire inferoseptal wall and inferior wall. Moderate hypokinesis of the left ventricular, entire anterolateral wall, inferolateral wall and anterior wall. Definity contrast agent was given IV to delineate the left ventricular endocardial borders. The left ventricular internal cavity size was normal in size. There is mild concentric left ventricular hypertrophy. Left ventricular diastolic parameters are indeterminate. Right Ventricle: The right ventricular size is not well visualized. Right vetricular  wall thickness was not well visualized. Right ventricular systolic function is normal. There is normal pulmonary artery systolic pressure. The tricuspid regurgitant velocity is 1.86 m/s, and with an assumed right atrial pressure of 3 mmHg, the estimated right ventricular systolic pressure is 16.8 mmHg. Left Atrium: Left  atrial size was normal in size. Right Atrium: Right atrial size was not well visualized. Pericardium: There is no evidence of pericardial effusion. Mitral Valve: The mitral valve is normal in structure. Mild mitral valve regurgitation. No evidence of mitral valve stenosis. Tricuspid Valve: The tricuspid valve is normal in structure. Tricuspid valve regurgitation is trivial. No evidence of tricuspid stenosis. Aortic Valve: The aortic valve was not well visualized. There is moderate calcification of the aortic valve. There is mild thickening of the aortic valve. Aortic valve regurgitation is not visualized. Mild aortic stenosis is present. Aortic valve mean gradient measures 8.5 mmHg. Aortic valve peak gradient measures 15.1 mmHg. Aortic valve area, by VTI measures 1.07 cm. Pulmonic Valve: The pulmonic valve was not well visualized. Pulmonic valve regurgitation is not visualized. No evidence of pulmonic stenosis. Aorta: The aortic root, ascending aorta, aortic arch and descending aorta are all structurally normal, with no evidence of dilitation or obstruction. Venous: The inferior vena cava is normal in size with greater than 50% respiratory variability, suggesting right atrial pressure of 3 mmHg. IAS/Shunts: The interatrial septum was not well visualized.  LEFT VENTRICLE PLAX 2D LVIDd:         4.75 cm   Diastology LVIDs:         3.55 cm   LV e' medial:    6.85 cm/s LV PW:         1.25 cm   LV E/e' medial:  16.1 LV IVS:        1.15 cm   LV e' lateral:   6.53 cm/s LVOT diam:     1.90 cm   LV E/e' lateral: 16.8 LV SV:         35 LV SV Index:   17 LVOT Area:     2.84 cm  RIGHT VENTRICLE             IVC RV S prime:     12.10 cm/s  IVC diam: 1.60 cm TAPSE (M-mode): 2.0 cm LEFT ATRIUM             Index LA diam:        2.80 cm 1.36 cm/m LA Vol (A2C):   43.1 ml 20.89 ml/m LA Vol (A4C):   55.4 ml 26.86 ml/m LA Biplane Vol: 50.6 ml 24.53 ml/m  AORTIC VALVE                     PULMONIC VALVE AV Area (Vmax):    1.24 cm       PV Vmax:       1.24 m/s AV Area (Vmean):   1.19 cm      PV Peak grad:  6.2 mmHg AV Area (VTI):     1.07 cm AV Vmax:           194.50 cm/s AV Vmean:          135.500 cm/s AV VTI:            0.324 m AV Peak Grad:      15.1 mmHg AV Mean Grad:      8.5 mmHg LVOT Vmax:         85.00 cm/s LVOT Vmean:  56.900 cm/s LVOT VTI:          0.123 m LVOT/AV VTI ratio: 0.38  AORTA Ao Root diam: 2.30 cm Ao Asc diam:  2.80 cm MITRAL VALVE                TRICUSPID VALVE MV Area (PHT): 6.27 cm     TR Peak grad:   13.8 mmHg MV Decel Time: 121 msec     TR Vmax:        186.00 cm/s MV E velocity: 110.00 cm/s MV A velocity: 103.00 cm/s  SHUNTS MV E/A ratio:  1.07         Systemic VTI:  0.12 m                             Systemic Diam: 1.90 cm Jodelle Red MD Electronically signed by Jodelle Red MD Signature Date/Time: 11/18/2022/12:16:38 PM    Final       Time coordinating discharge: over 30 minutes  SIGNED:  Sunnie Nielsen DO [facility]

## 2022-11-21 NOTE — Progress Notes (Signed)
Heart Failure Stewardship Pharmacy Note  PCP: Sherlene Shams, MD PCP-Cardiologist: None  HPI: [name] is a 77 y.o. 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 EKG on admission with ST depression in lateral leads. CXR showed small bilateral pleural effusions. CTA negative for PE. Troponin on admission was 1228, trending up to 1560, then down to 1483. TTE this admission showed LVEF of 35-40%, mild AS, mild MR. Underwent LHC 11/20/22 showing diffuse and moderately calcified coronary arteries with severe two-vessel coronary artery disease where culprit lesion was suspected to be plaque rupture of OM1 and mid left circumflex.  Severe stenosis in the distal RCA extending into the ostium of the right PDA and moderate LAD disease in multiple areas was also noted.  Treated OM1/mid left circumflex using 2 drug-eluting stents.  Pertinent Lab Values: Creatinine, Ser  Date Value Ref Range Status  11/21/2022 0.74 0.44 - 1.00 mg/dL Final   BUN  Date Value Ref Range Status  11/21/2022 15 8 - 23 mg/dL Final  16/10/9602 12 4 - 21 mg/dL    Potassium  Date Value Ref Range Status  11/21/2022 3.6 3.5 - 5.1 mmol/L Final   Sodium  Date Value Ref Range Status  11/21/2022 135 135 - 145 mmol/L Final  10/18/2009 142 137 - 147 mmol/L    Magnesium  Date Value Ref Range Status  03/19/2017 2.0 1.5 - 2.5 mg/dL Final   Hgb V4U MFr Bld  Date Value Ref Range Status  11/13/2022 7.1 (H) 4.6 - 6.5 % Final    Comment:    Glycemic Control Guidelines for People with Diabetes:Non Diabetic:  <6%Goal of Therapy: <7%Additional Action Suggested:  >8%    TSH  Date Value Ref Range Status  11/13/2022 3.43 0.35 - 5.50 uIU/mL  Final    Vital Signs: Admission weight: Temp:  [97.8 F (36.6 C)-98.4 F (36.9 C)] 97.8 F (36.6 C) (11/05 0756) Pulse Rate:  [0-106] 86 (11/05 0756) Cardiac Rhythm: Normal sinus rhythm (11/05 0700) Resp:  [16-26] 17 (11/05 0521) BP: (106-154)/(50-91) 136/76 (11/05 0756) SpO2:  [96 %-99 %] 97 % (11/05 0756) No intake or output data in the 24 hours ending 11/21/22 1044  Current Heart Failure Medications:  Loop diuretic: none Beta-Blocker: carvedilol 6.25 mg BID ACEI/ARB/ARNI: losartan 25 mg daily MRA: none SGLT2i: none  Prior to admission Heart Failure Medications:  Loop diuretic: none Beta-Blocker: none ACEI/ARB/ARNI: telmisartan 10 mg daily MRA: none SGLT2i: none  Assessment: 1. Acute systolic heart failure (LVEF 35-40%)  , due to ICM. NYHA class II symptoms.  -Symptoms: Reports symptoms have much improved. One episode of shortness of breath overnight that may be due to anxiety or to new Brillinta start. Denies orthopnea, but unable to lay flat due to back pain. Normally wears CPAP for OSA. -Volume: Appears euvolemic on exam.  -Hemodynamics: SBP 110s-170s and HR 90s.  -BB: curerently taking carvedilol 6.25 mg BID. Can consider further titration outpatient. -ACEI/ARB/ARNI: Losartan recently increase to 25 mg daily. -MRA: Would benefit from starting MRA given hypokalemia and HFrEF. Renal function is stable for initiation. Based on BP would likely tolerate 12.5 mg or 25 mg daily. -SGLT2i: Patient reports multiple UTIs on previous antidiabetic medications. SGLT2i can be considered in the outpatient setting  based on risk benefit discussion.  Plan: 1) Medication changes recommended at this time: -Consider starting spironolactone 12.5 mg daily  2) Patient assistance: -None  3) Education: - Patient has been educated on current HF medications and potential additions to HF medication regimen - Patient verbalizes understanding that over the next few months, these medication  doses may change and more medications may be added to optimize HF regimen - Patient has been educated on basic disease state pathophysiology and goals of therapy  Medication Assistance / Insurance Benefits Check: Does the patient have prescription insurance?  Yes, [employer] Medicare   Type of insurance plan:  Does the patient qualify for medication assistance through manufacturers or grants? No   Outpatient Pharmacy: Prior to admission outpatient [facility] Is the patient agreeable to switch to [facility]?: No Is the patient willing to utilize a [facility] pharmacy at discharge?: Yes  Please do not hesitate to reach out with questions or concerns,  Enos Fling, PharmD, CPP, BCPS Heart Failure Pharmacist  Phone - [phone] 11/21/2022 10:44 AM

## 2022-11-21 NOTE — Consult Note (Signed)
Medications delivered to the patient.   Paschal Dopp, PharmD, BCPS

## 2022-11-21 NOTE — Progress Notes (Signed)
Heart Failure Nurse Navigator Progress Note  PCP: Sherlene Shams, MD PCP-Cardiologist: None Admission Diagnosis: NSTEMI ( non-ST elevated myocardial infarction) Admitted from: Home  Presentation:   [name] presented after call from PCP visit with troponin level greater than 1200. A few days prior to PCP appointment she had neck and bilateral shoulder pain with associated palpitations and denied any significant pain in the chest.    ECHO/ LVEF: 35-40%  Clinical Course:  Past Medical History:  Diagnosis Date   Asthma    Asthma due to environmental allergies    grass, mold, trees, dust   Cataract    Cellulitis    LEFT LEG   Diabetes mellitus    CONTROLLED ON DIET ALONE   Edema leg    LEFT LEG...CHRONIC   Endometrial hyperplasia    External hemorrhoid    Fatty liver    Glaucoma    Hepatic cyst    STABLE PER 05/20/2008 ULTRASOUND   Hypertension    borderline...controlled since taking herself of HCTZ IN JULY   IBS (irritable bowel syndrome)    Murmur, cardiac    Neuropathy    OSA on CPAP    Osteoarthritis    Psoriasis    Sleep apnea    CPAP     Social History   Socioeconomic History   Marital status: Married    Spouse name: Not on file   Number of children: 2   Years of education: Not on file   Highest education level: Some college, no degree  Occupational History   Occupation: RETIRED  Tobacco Use   Smoking status: Former    Current packs/day: 0.00    Types: Cigarettes    Quit date: 01/16/1978    Years since quitting: 44.8   Smokeless tobacco: Never   Tobacco comments:    REMOTELY QUIT IN 1987 AFTER A FEW YEARS OF USE  Vaping Use   Vaping status: Never Used  Substance and Sexual Activity   Alcohol use: No   Drug use: No   Sexual activity: Not on file  Other Topics Concern   Not on file  Social History Narrative   Lives with husband.   Social Determinants of Health   Financial Resource Strain: Low Risk  (07/30/2018)   Overall Financial  Resource Strain (CARDIA)    Difficulty of Paying Living Expenses: Not hard at all  Food Insecurity: No Food Insecurity (11/18/2022)   Hunger Vital Sign    Worried About Running Out of Food in the Last Year: Never true    Ran Out of Food in the Last Year: Never true  Transportation Needs: No Transportation Needs (11/21/2022)   PRAPARE - Administrator, Civil Service (Medical): No    Lack of Transportation (Non-Medical): No  Recent Concern: Transportation Needs - Unmet Transportation Needs (11/21/2022)   PRAPARE - Transportation    Lack of Transportation (Medical): Yes    Lack of Transportation (Non-Medical): Yes  Physical Activity: Unknown (07/30/2018)   Exercise Vital Sign    Days of Exercise per Week: 0 days    Minutes of Exercise per Session: Not on file  Stress: No Stress Concern Present (07/30/2018)   [employer] of Occupational Health - Occupational Stress Questionnaire    Feeling of Stress : Not at all  Social Connections: Not on file   Education Assessment and Provision:  Detailed education and instructions provided on heart failure disease management including the following:  Signs and symptoms of Heart Failure When  to call the physician Importance of daily weights Low sodium diet Fluid restriction Medication management Anticipated future follow-up appointments  Patient education given on each of the above topics.  Patient acknowledges understanding via teach back method and acceptance of all instructions.  Education Materials:  "Living Better With Heart Failure" Booklet, HF zone tool, & Daily Weight Tracker Tool.  Patient has scale at home: Yes Patient has pill box at home: No-but will get one.    High Risk Criteria for Readmission and/or Poor Patient Outcomes: Heart failure hospital admissions (last 6 months): 0  No Show rate: 1 Difficult social situation: None Demonstrates medication adherence: Yes Primary Language: English Literacy level:  Reading, Writing & Comprehension  Barriers of Care:   Diet & Fluid Restrictions Daily Weights Medication Compliance  Considerations/Referrals:   Referral made to Heart Failure Pharmacist Stewardship: Yes Referral made to Heart Failure CSW/NCM TOC: No Referral made to Heart & Vascular TOC clinic: Yes  Items for Follow-up on DC/TOC: Diet & Fluid Restrictions-Pt will work on [employer] Daily Weights Medication Compliance Continued Heart Failure Education   Roxy Horseman, RN, BSN [facility] Heart Failure Navigator Secure Chat Only

## 2022-11-21 NOTE — Progress Notes (Signed)
Rounding Note    Patient Name: [name] Date of Encounter: 11/21/2022  [facility] Cardiologist: New  Subjective   No chest pain.  She reports some shortness of breath after she took Brilinta but her symptoms are improved this morning.  She is very anxious.  Inpatient Medications    Scheduled Meds:  aspirin EC  81 mg Oral Daily   carvedilol  6.25 mg Oral BID WC   latanoprost  1 drop Both Eyes QHS   losartan  25 mg Oral Daily   rosuvastatin  5 mg Oral Daily   sodium chloride flush  3 mL Intravenous Q12H   ticagrelor  90 mg Oral BID   Continuous Infusions:  sodium chloride     PRN Meds: sodium chloride, acetaminophen, albuterol, ALPRAZolam, guaiFENesin-dextromethorphan, nitroGLYCERIN, ondansetron (ZOFRAN) IV, sodium chloride flush   Vital Signs    Vitals:   11/20/22 1820 11/20/22 2000 11/20/22 2308 11/21/22 0521  BP: 132/79 114/64 126/74 114/73  Pulse: 87 93 93 99  Resp:  17 17 17   Temp:  97.9 F (36.6 C) 98.4 F (36.9 C) 98.3 F (36.8 C)  TempSrc:      SpO2:  96% 96% 98%  Weight:      Height:       No intake or output data in the 24 hours ending 11/21/22 0749    11/18/2022    6:00 PM 11/17/2022    9:13 AM 06/22/2022    8:26 AM  Last 3 Weights  Weight (lbs) 215 lb 2.7 oz 215 lb 1.9 oz 222 lb 3.2 oz  Weight (kg) 97.6 kg 97.578 kg 100.789 kg      Telemetry    Sinus rhythm with PVC - Personally Reviewed  ECG    Sinus rhythm with PVCs- Personally Reviewed  Physical Exam   GEN: No acute distress.   Neck: No JVD Cardiac: RRR, no murmurs, rubs, or gallops.  Respiratory: Clear to auscultation bilaterally. GI: Soft, nontender, non-distended  MS: No edema; No deformity. Neuro:  Nonfocal  Psych: Normal affect  Right radial pulses normal with no hematoma  Labs    High Sensitivity Troponin:   Recent Labs  Lab 11/17/22 1646 11/17/22 1812  TROPONINIHS 1,560* 1,483*     Chemistry Recent Labs  Lab 11/17/22 1002 11/17/22 1646  11/21/22 0635  NA 137 136 135  K 4.3 3.9 3.6  CL 103 103 102  CO2 23 24 22   GLUCOSE 139* 143* 134*  BUN 14 17 15   CREATININE 0.76 0.81 0.74  CALCIUM 9.1 8.6* 8.6*  PROT 7.1  --   --   ALBUMIN 3.9  --   --   AST 26  --   --   ALT 20  --   --   ALKPHOS 76  --   --   BILITOT 1.5*  --   --   GFRNONAA  --  >60 >60  ANIONGAP  --  9 11    Lipids No results for input(s): "CHOL", "TRIG", "HDL", "LABVLDL", "LDLCALC", "CHOLHDL" in the last 168 hours.  Hematology Recent Labs  Lab 11/19/22 0531 11/20/22 0606 11/21/22 0635  WBC 12.3* 11.9* 11.1*  RBC 4.39 4.89 4.35  HGB 13.0 14.5 12.8  HCT 37.7 42.4 37.2  MCV 85.9 86.7 85.5  MCH 29.6 29.7 29.4  MCHC 34.5 34.2 34.4  RDW 12.6 12.7 12.8  PLT 354 378 348   Thyroid No results for input(s): "TSH", "FREET4" in the last 168 hours.  BNPNo  results for input(s): "BNP", "PROBNP" in the last 168 hours.  DDimer No results for input(s): "DDIMER" in the last 168 hours.   Radiology    CARDIAC CATHETERIZATION  Result Date: 11/20/2022   Prox RCA lesion is 30% stenosed.   Dist RCA lesion is 70% stenosed with 90% stenosed side branch in RPDA.   Prox LAD to Mid LAD lesion is 30% stenosed.   1st Diag lesion is 50% stenosed.   Mid LAD lesion is 40% stenosed.   Mid LAD to Dist LAD lesion is 30% stenosed.   Ost LM lesion is 30% stenosed.   1st Mrg lesion is 99% stenosed.   Prox Cx to Mid Cx lesion is 90% stenosed.   A drug-eluting stent was successfully placed using a STENT ONYX FRONTIER 2.5X26.   A drug-eluting stent was successfully placed using a STENT ONYX FRONTIER 2.5X22.   Post intervention, there is a 0% residual stenosis.   Post intervention, there is a 0% residual stenosis. 1.  Diffuse and moderately calcified coronary arteries with severe two-vessel coronary artery disease.  The culprit for myocardial infarction seems to be plaque rupture and OM1 and mid left circumflex.  In addition, there is severe stenosis in the distal RCA extending into the ostium  of the right PDA.  The LAD has moderate disease in multiple areas. 2.  Left ventricular angiography was not performed.  EF was moderately reduced by echo.  LVEDP was mildly elevated. 3.  Successful complex bifurcation angioplasty and kissing stent placement to OM1/mid left circumflex using 2 drug-eluting stents. Recommendations: Dual antiplatelet therapy for at least 12 months and preferably longer. The patient is intolerant to statins but will try small dose rosuvastatin 5 mg daily. I switch Toprol to carvedilol for better blood pressure control. Recommend staged RCA PCI in few weeks.    Cardiac Studies   LHC 11/20/22    Prox RCA lesion is 30% stenosed.   Dist RCA lesion is 70% stenosed with 90% stenosed side branch in RPDA.   Prox LAD to Mid LAD lesion is 30% stenosed.   1st Diag lesion is 50% stenosed.   Mid LAD lesion is 40% stenosed.   Mid LAD to Dist LAD lesion is 30% stenosed.   Ost LM lesion is 30% stenosed.   1st Mrg lesion is 99% stenosed.   Prox Cx to Mid Cx lesion is 90% stenosed.   A drug-eluting stent was successfully placed using a STENT ONYX FRONTIER 2.5X26.   A drug-eluting stent was successfully placed using a STENT ONYX FRONTIER 2.5X22.   Post intervention, there is a 0% residual stenosis.   Post intervention, there is a 0% residual stenosis.   1.  Diffuse and moderately calcified coronary arteries with severe two-vessel coronary artery disease.  The culprit for myocardial infarction seems to be plaque rupture and OM1 and mid left circumflex.  In addition, there is severe stenosis in the distal RCA extending into the ostium of the right PDA.  The LAD has moderate disease in multiple areas. 2.  Left ventricular angiography was not performed.  EF was moderately reduced by echo.  LVEDP was mildly elevated. 3.  Successful complex bifurcation angioplasty and kissing stent placement to OM1/mid left circumflex using 2 drug-eluting stents.   Recommendations: Dual antiplatelet  therapy for at least 12 months and preferably longer. The patient is intolerant to statins but will try small dose rosuvastatin 5 mg daily. I switch Toprol to carvedilol for better blood pressure control. Recommend staged RCA  PCI in few weeks.   2D echo 11/18/2022: 1. Left ventricular ejection fraction, by estimation, is 35 to 40%. The  left ventricle has moderately decreased function. The left ventricle  demonstrates regional wall motion abnormalities (see scoring  diagram/findings for description). There is mild  concentric left ventricular hypertrophy. Left ventricular diastolic  parameters are indeterminate. There is mild hypokinesis of the left  ventricular, entire inferoseptal wall and inferior wall. There is moderate  hypokinesis of the left ventricular, entire   anterolateral wall, inferolateral wall and anterior wall.   2. Right ventricular systolic function is normal. The right ventricular  size is not well visualized. There is normal pulmonary artery systolic  pressure.   3. The mitral valve is normal in structure. Mild mitral valve  regurgitation. No evidence of mitral stenosis.   4. The aortic valve was not well visualized. There is moderate  calcification of the aortic valve. There is mild thickening of the aortic  valve. Aortic valve regurgitation is not visualized. Mild aortic valve  stenosis.   5. The inferior vena cava is normal in size with greater than 50%  respiratory variability, suggesting right atrial pressure of 3 mmHg.   Comparison(s): No prior Echocardiogram.       Patient Profile     77 y.o. female with a h/o CAD on CT, DM2, HTN, HLD with intolerance to Lipitor, fatty liver, asthma, obesity, and OSA who is being seen for NSTEMI and acute HFrEF.  Assessment & Plan    NSTEMI - HS troponin peaked 1560 - LHC showed diffuse and moderately calcified arteries with severe 2V CAD. The culprit was likely plaque rupture and OM1 and mid left circumflex treated  with successful bifurcation angioplasty and kissing stent placement to OM1/mid left circumflex using 2 drug-eluding stents.  - DAPT for at least 12 months with ASA and Brilinta - continue ASA 81mg  daily, and carvedilol - cath site stable -She does have significant stenosis in the distal right coronary artery for which I recommend staged PCI in few weeks.  Acute HFrEF - suspect ICM - Echo showed LVEF 35-40% with mild hypokinesis of the inferior septal and inferior wall and moderate HK of the entire anterolateral, inferolateral and anterior walls with normal RVSF and RVSP - appears euvolemic -  continue losartan and carvedilol.  HTN - Bps stable - continue Coreg 6.25mg BID and Losartan 25mg  daily  HLD - LDL 127, goal<55 - intolerant to Lipitor and Zetia - continue Crestor 5mg  daily - will need PCSK9i  DM2 - A1C 7.1 -per IM  The patient can be discharged home from a cardiac standpoint and will arrange for follow-up.  For questions or updates, please contact  HeartCare Please consult [email or web address] for contact info under        Signed, Lorine Bears, MD 11/21/2022, 7:49 AM

## 2022-11-22 ENCOUNTER — Telehealth: Payer: Self-pay

## 2022-11-22 NOTE — Transitions of Care (Post Inpatient/ED Visit) (Signed)
11/22/2022  Name: [name] MRN: [id number] DOB: 07-15-1945  Today's TOC FU Call Status: Today's TOC FU Call Status:: Successful TOC FU Call Completed TOC FU Call Complete Date: 11/22/22 Patient's Name and Date of Birth confirmed.  Transition Care Management Follow-up Telephone Call Date of Discharge: 11/22/22 Discharge Facility: [facility]) Type of Discharge: Inpatient Admission Primary Inpatient Discharge Diagnosis:: NSTEMI How have you been since you were released from the hospital?: Better Any questions or concerns?: No  Items Reviewed: Did you receive and understand the discharge instructions provided?: Yes Medications obtained,verified, and reconciled?: Yes (Medications Reviewed) Any new allergies since your discharge?: No Dietary orders reviewed?: NA Do you have support at home?: Yes People in Home: spouse Name of Support/Comfort Primary Source: Christean Leaf  Medications Reviewed Today: Medications Reviewed Today     Reviewed by Redge Gainer, RN (Case Manager) on 11/22/22 at 1116  Med List Status: <None>   Medication Order Taking? Sig Documenting Provider Last Dose Status Informant  albuterol (VENTOLIN HFA) 108 (90 Base) MCG/ACT inhaler [id number] No Inhale 2 puffs into the lungs every 6 (six) hours as needed for wheezing or shortness of breath. [provider] unk Active   ALPRAZolam Prudy Feeler) 0.25 MG tablet [id number] No Take 1 tablet (0.25 mg total) by mouth 2 (two) times daily as needed for anxiety. Sherlene Shams, MD unk Active   aspirin EC 81 MG tablet [id number]  Take 1 tablet (81 mg total) by mouth daily. Swallow whole. Sunnie Nielsen, DO  Active   budesonide (PULMICORT) 180 MCG/ACT inhaler [id number] No Inhale 2 puffs into the lungs 2 (two) times daily. [provider] unk Active   carvedilol (COREG) 6.25 MG tablet [id number]  Take 1 tablet (6.25 mg total) by mouth 2 (two) times daily with a meal. Sunnie Nielsen, DO  Active   cetirizine (ZYRTEC) 10 MG tablet [id number] No Take 10 mg by mouth daily. [provider] unk Active   EPINEPHrine 0.3 mg/0.3 mL IJ SOAJ injection [id number] No See admin instructions. [provider] unk Active   losartan (COZAAR) 25 MG tablet [id number]  Take 1 tablet (25 mg total) by mouth daily. Sunnie Nielsen, DO  Active   LUMIGAN 0.01 % SOLN [id number] No Place 1 drop into both eyes at bedtime. [provider] 11/16/2022 Active   MILK THISTLE PO [id number] No Take 1 capsule by mouth daily.  [provider] Taking Active   nitroGLYCERIN (NITROSTAT) 0.4 MG SL tablet [id number]  Place 1 tablet (0.4 mg total) under the tongue every 5 (five) minutes for up to 3 doses as needed for chest pain. Sunnie Nielsen, DO  Active   ondansetron [facility]) 4 MG tablet [id number] No Take 1 tablet (4 mg total) by mouth every 8 (eight) hours as needed for nausea or vomiting. Sherlene Shams, MD unk Active   Probiotic Product (PROBIOTIC COMPLEX ACIDOPHILUS PO) [id number] No Take 1 capsule by mouth daily. [provider] Taking Active   rosuvastatin (CRESTOR) 5 MG tablet [id number]  Take 1 tablet (5 mg total) by mouth daily. Sunnie Nielsen, DO  Active   sodium chloride (OCEAN) 0.65 % SOLN nasal spray [id number] No Place 1 spray into the nose as needed. [provider] Taking Active   ticagrelor (BRILINTA) 90 MG TABS tablet [id number]  Take 1 tablet (90 mg total) by mouth 2 (two) times daily. Sunnie Nielsen, DO  Active   tildrakizumab-asmn [facility]) 100 MG/ML subcutaneous injection [id number] No Inject 100 mg into  the skin every 3 (three) months. [provider] Taking Active   triamcinolone cream (KENALOG) 0.1 % [id number] No Apply 1 Application topically 2 (two) times daily. Until itching has resolved Sherlene Shams, MD unk Active             Home Care and Equipment/Supplies: Were Home Health Services Ordered?: NA Any new  equipment or medical supplies ordered?: NA  Functional Questionnaire: Do you need assistance with bathing/showering or dressing?: No Do you need assistance with meal preparation?: No Do you need assistance with eating?: No Do you have difficulty maintaining continence: No Do you need assistance with getting out of bed/getting out of a chair/moving?: No Do you have difficulty managing or taking your medications?: No  Follow up appointments reviewed: PCP Follow-up appointment confirmed?: Yes Date of PCP follow-up appointment?: 11/30/22 Follow-up Provider: Duncan Dull Specialist [facility] Follow-up appointment confirmed?: Yes Date of Specialist follow-up appointment?: 11/28/22 Follow-Up Specialty Provider:: Clarisa Kindred Do you need transportation to your follow-up appointment?: No Do you understand care options if your condition(s) worsen?: Yes-patient verbalized understanding  SDOH Interventions Today    Flowsheet Row Most Recent Value  SDOH Interventions   Food Insecurity Interventions Intervention Not Indicated  Transportation Interventions Intervention Not Indicated      The patient declines the 30 day Outreach Program  Deidre Ala, RN RN Care Manager [facility]Population [facility] [phone]

## 2022-11-27 NOTE — Progress Notes (Unsigned)
Advanced Heart Failure Clinic Note   Referring Physician: PCP: Sherlene Shams, MD (last seen 11/24) Cardiologist: None   HPI:  Ms Rydel is a 77 y/o female with a history of NSTEMI, psoriasis, OSA on CPAP, asthma, obesity, HTN, anxiety and chronic heart failure.    Admitted 11/17/22 due to 3-day history of recurrent substernal chest pain radiating to jaw, both shoulders and left arm, improved with deep breathing. Troponin peaked at 1568. ST depression in lateral leads. CTA chest no PE, mild cardiac enlargement with prominent coronary artery calcifications. No STEMI. Echo EF 35-40%, (+)RWMA. Cath w/ complex PCI. Meds adjusted and troponin trended down to 1483. Hx intolerance to atorvastatin w/ myalgias, will trial rosuvastatin - consider PCSK9i outpatient. Consider SGLT2.   Echo 11/18/22: EF 35-40% with mild LVH, mild MR/ AS  LHC 11/20/22:    Prox RCA lesion is 30% stenosed.   Dist RCA lesion is 70% stenosed with 90% stenosed side branch in RPDA.   Prox LAD to Mid LAD lesion is 30% stenosed.   1st Diag lesion is 50% stenosed.   Mid LAD lesion is 40% stenosed.   Mid LAD to Dist LAD lesion is 30% stenosed.   Ost LM lesion is 30% stenosed.   1st Mrg lesion is 99% stenosed.   Prox Cx to Mid Cx lesion is 90% stenosed.   A drug-eluting stent was successfully placed using a STENT ONYX FRONTIER 2.5X26.   A drug-eluting stent was successfully placed using a STENT ONYX FRONTIER 2.5X22.   Post intervention, there is a 0% residual stenosis.   Post intervention, there is a 0% residual stenosis.  1.  Diffuse and moderately calcified coronary arteries with severe two-vessel coronary artery disease.  The culprit for myocardial infarction seems to be plaque rupture and OM1 and mid left circumflex.  In addition, there is severe stenosis in the distal RCA extending into the ostium of the right PDA.  The LAD has moderate disease in multiple areas. 2.  Left ventricular angiography was not performed.  EF was  moderately reduced by echo.  LVEDP was mildly elevated. 3.  Successful complex bifurcation angioplasty and kissing stent placement to OM1/mid left circumflex using 2 drug-eluting stents.  Recommendations: Dual antiplatelet therapy for at least 12 months and preferably longer. The patient is intolerant to statins but will try small dose rosuvastatin 5 mg daily. I switch Toprol to carvedilol for better blood pressure control. Recommend staged RCA PCI in few weeks.  She presents today for her initial visit with a chief complaint of   Review of Systems: [y] = yes, [ ]  = no   General: Weight gain [ ] ; Weight loss [ ] ; Anorexia [ ] ; Fatigue [ ] ; Fever [ ] ; Chills [ ] ; Weakness [ ]   Cardiac: Chest pain/pressure [ ] ; Resting SOB [ ] ; Exertional SOB [ ] ; Orthopnea [ ] ; Pedal Edema [ ] ; Palpitations [ ] ; Syncope [ ] ; Presyncope [ ] ; Paroxysmal nocturnal dyspnea[ ]   Pulmonary: Cough [ ] ; Wheezing[ ] ; Hemoptysis[ ] ; Sputum [ ] ; Snoring [ ]   GI: Vomiting[ ] ; Dysphagia[ ] ; Melena[ ] ; Hematochezia [ ] ; Heartburn[ ] ; Abdominal pain [ ] ; Constipation [ ] ; Diarrhea [ ] ; BRBPR [ ]   GU: Hematuria[ ] ; Dysuria [ ] ; Nocturia[ ]   Vascular: Pain in legs with walking [ ] ; Pain in feet with lying flat [ ] ; Non-healing sores [ ] ; Stroke [ ] ; TIA [ ] ; Slurred speech [ ] ;  Neuro: Headaches[ ] ; Vertigo[ ] ; Seizures[ ] ; Paresthesias[ ] ;Blurred vision [ ] ;  Diplopia [ ] ; Vision changes [ ]   Ortho/Skin: Arthritis [ ] ; Joint pain [ ] ; Muscle pain [ ] ; Joint swelling [ ] ; Back Pain [ ] ; Rash [ ]   Psych: Depression[ ] ; Anxiety[ ]   Heme: Bleeding problems [ ] ; Clotting disorders [ ] ; Anemia [ ]   Endocrine: Diabetes [ ] ; Thyroid dysfunction[ ]    Past Medical History:  Diagnosis Date   Asthma    Asthma due to environmental allergies    grass, mold, trees, dust   Cataract    Cellulitis    LEFT LEG   Diabetes mellitus    CONTROLLED ON DIET ALONE   Edema leg    LEFT LEG...CHRONIC   Endometrial hyperplasia    External  hemorrhoid    Fatty liver    Glaucoma    Hepatic cyst    STABLE PER 05/20/2008 ULTRASOUND   Hypertension    borderline...controlled since taking herself of HCTZ IN JULY   IBS (irritable bowel syndrome)    Murmur, cardiac    Neuropathy    OSA on CPAP    Osteoarthritis    Psoriasis    Sleep apnea    CPAP    Current Outpatient Medications  Medication Sig Dispense Refill   albuterol (VENTOLIN HFA) 108 (90 Base) MCG/ACT inhaler Inhale 2 puffs into the lungs every 6 (six) hours as needed for wheezing or shortness of breath.     ALPRAZolam (XANAX) 0.25 MG tablet Take 1 tablet (0.25 mg total) by mouth 2 (two) times daily as needed for anxiety. 30 tablet 0   aspirin EC 81 MG tablet Take 1 tablet (81 mg total) by mouth daily. Swallow whole. 30 tablet 12   budesonide (PULMICORT) 180 MCG/ACT inhaler Inhale 2 puffs into the lungs 2 (two) times daily.     carvedilol (COREG) 6.25 MG tablet Take 1 tablet (6.25 mg total) by mouth 2 (two) times daily with a meal. 60 tablet 0   cetirizine (ZYRTEC) 10 MG tablet Take 10 mg by mouth daily.     EPINEPHrine 0.3 mg/0.3 mL IJ SOAJ injection See admin instructions.     losartan (COZAAR) 25 MG tablet Take 1 tablet (25 mg total) by mouth daily. 30 tablet 0   LUMIGAN 0.01 % SOLN Place 1 drop into both eyes at bedtime.     MILK THISTLE PO Take 1 capsule by mouth daily.      nitroGLYCERIN (NITROSTAT) 0.4 MG SL tablet Place 1 tablet (0.4 mg total) under the tongue every 5 (five) minutes for up to 3 doses as needed for chest pain. 25 tablet 0   ondansetron (ZOFRAN) 4 MG tablet Take 1 tablet (4 mg total) by mouth every 8 (eight) hours as needed for nausea or vomiting. 30 tablet 02   Probiotic Product (PROBIOTIC COMPLEX ACIDOPHILUS PO) Take 1 capsule by mouth daily.     rosuvastatin (CRESTOR) 5 MG tablet Take 1 tablet (5 mg total) by mouth daily. 30 tablet 0   sodium chloride (OCEAN) 0.65 % SOLN nasal spray Place 1 spray into the nose as needed.     ticagrelor  (BRILINTA) 90 MG TABS tablet Take 1 tablet (90 mg total) by mouth 2 (two) times daily. 60 tablet 0   tildrakizumab-asmn (ILUMYA) 100 MG/ML subcutaneous injection Inject 100 mg into the skin every 3 (three) months.     triamcinolone cream (KENALOG) 0.1 % Apply 1 Application topically 2 (two) times daily. Until itching has resolved 80 g 2   No current facility-administered medications for this  visit.    Allergies  Allergen Reactions   Meloxicam Other (See Comments)    Hypertension, tachycardia   Statins Other (See Comments)    "Muscle weakness"   Sulfa Drugs Cross Reactors    Zostavax [employer] Vaccine Live]     Bad reaction       Social History   Socioeconomic History   Marital status: Married    Spouse name: Not on file   Number of children: 2   Years of education: Not on file   Highest education level: Some college, no degree  Occupational History   Occupation: RETIRED  Tobacco Use   Smoking status: Former    Current packs/day: 0.00    Types: Cigarettes    Quit date: 01/16/1978    Years since quitting: 44.8   Smokeless tobacco: Never   Tobacco comments:    REMOTELY QUIT IN 1987 AFTER A FEW YEARS OF USE  Vaping Use   Vaping status: Never Used  Substance and Sexual Activity   Alcohol use: No   Drug use: No   Sexual activity: Not on file  Other Topics Concern   Not on file  Social History Narrative   Lives with husband.   Social Determinants of Health   Financial Resource Strain: Low Risk  (07/30/2018)   Overall Financial Resource Strain (CARDIA)    Difficulty of Paying Living Expenses: Not hard at all  Food Insecurity: No Food Insecurity (11/22/2022)   Hunger Vital Sign    Worried About Running Out of Food in the Last Year: Never true    Ran Out of Food in the Last Year: Never true  Transportation Needs: No Transportation Needs (11/22/2022)   PRAPARE - Administrator, Civil Service (Medical): No    Lack of Transportation (Non-Medical): No  Recent  Concern: Transportation Needs - Unmet Transportation Needs (11/21/2022)   PRAPARE - Transportation    Lack of Transportation (Medical): Yes    Lack of Transportation (Non-Medical): Yes  Physical Activity: Unknown (07/30/2018)   Exercise Vital Sign    Days of Exercise per Week: 0 days    Minutes of Exercise per Session: Not on file  Stress: No Stress Concern Present (07/30/2018)   [employer] of Occupational Health - Occupational Stress Questionnaire    Feeling of Stress : Not at all  Social Connections: Not on file  Intimate Partner Violence: Not At Risk (11/18/2022)   Humiliation, Afraid, Rape, and Kick questionnaire    Fear of Current or Ex-Partner: No    Emotionally Abused: No    Physically Abused: No    Sexually Abused: No      Family History  Problem Relation Age of Onset   Mental illness Mother        DEMENTIA   Heart disease Father 62       MI.Marland KitchenMarland KitchenS/D AVR/CABG x3   Cancer Neg Hx        no ovarian colon breast   Breast cancer Neg Hx        PHYSICAL EXAM: General:  Well appearing. No respiratory difficulty HEENT: normal Neck: supple. no JVD. No lymphadenopathy or thyromegaly appreciated. Cor: PMI nondisplaced. Regular rate & rhythm. No rubs, gallops or murmurs. Lungs: clear Abdomen: soft, nontender, nondistended. No hepatosplenomegaly. No bruits or masses.  Extremities: no cyanosis, clubbing, rash, edema Neuro: alert & oriented x 3, cranial nerves grossly intact. moves all 4 extremities w/o difficulty. Affect pleasant.  ECG:   ASSESSMENT & PLAN:  1: Chronic heart failure  with reduced ejection fraction- - suspect due to - NYHA class - euvolemic - weighing daily - Echo 11/18/22: EF 35-40% with mild LVH, mild MR/ AS - continue  - proNP 07/27/07 was 38.5  2: HTN- - BP - saw PCP Darrick Huntsman) 11/24 - BMP 11/21/22 showed sodium 135, potassium 3.6, creatinine 0.74 & GFR >60  3: CAD- - continue - saw cardiology - LHC 11/20/22:    Prox RCA lesion is 30%  stenosed.   Dist RCA lesion is 70% stenosed with 90% stenosed side branch in RPDA.   Prox LAD to Mid LAD lesion is 30% stenosed.   1st Diag lesion is 50% stenosed.   Mid LAD lesion is 40% stenosed.   Mid LAD to Dist LAD lesion is 30% stenosed.   Ost LM lesion is 30% stenosed.   1st Mrg lesion is 99% stenosed.   Prox Cx to Mid Cx lesion is 90% stenosed.   A drug-eluting stent was successfully placed using a STENT ONYX FRONTIER 2.5X26.   A drug-eluting stent was successfully placed using a STENT ONYX FRONTIER 2.5X22.   Post intervention, there is a 0% residual stenosis.   Post intervention, there is a 0% residual stenosis.  1.  Diffuse and moderately calcified coronary arteries with severe two-vessel coronary artery disease.  The culprit for myocardial infarction seems to be plaque rupture and OM1 and mid left circumflex.  In addition, there is severe stenosis in the distal RCA extending into the ostium of the right PDA.  The LAD has moderate disease in multiple areas. 2.  Left ventricular angiography was not performed.  EF was moderately reduced by echo.  LVEDP was mildly elevated. 3.  Successful complex bifurcation angioplasty and kissing stent placement to OM1/mid left circumflex using 2 drug-eluting stents.  Recommendations: Dual antiplatelet therapy for at least 12 months and preferably longer. The patient is intolerant to statins but will try small dose rosuvastatin 5 mg daily.  4: OSA- - wearing CPAP    Delma Freeze, FNP 11/27/22

## 2022-11-28 ENCOUNTER — Other Ambulatory Visit: Payer: Self-pay

## 2022-11-28 ENCOUNTER — Ambulatory Visit: Payer: Medicare Other | Attending: Family | Admitting: Family

## 2022-11-28 ENCOUNTER — Encounter: Payer: Self-pay | Admitting: Family

## 2022-11-28 VITALS — BP 104/63 | HR 74 | Ht 66.0 in | Wt 207.0 lb

## 2022-11-28 DIAGNOSIS — F419 Anxiety disorder, unspecified: Secondary | ICD-10-CM | POA: Insufficient documentation

## 2022-11-28 DIAGNOSIS — E1136 Type 2 diabetes mellitus with diabetic cataract: Secondary | ICD-10-CM | POA: Insufficient documentation

## 2022-11-28 DIAGNOSIS — Z955 Presence of coronary angioplasty implant and graft: Secondary | ICD-10-CM | POA: Insufficient documentation

## 2022-11-28 DIAGNOSIS — I252 Old myocardial infarction: Secondary | ICD-10-CM | POA: Diagnosis not present

## 2022-11-28 DIAGNOSIS — Z79899 Other long term (current) drug therapy: Secondary | ICD-10-CM | POA: Insufficient documentation

## 2022-11-28 DIAGNOSIS — J45909 Unspecified asthma, uncomplicated: Secondary | ICD-10-CM | POA: Diagnosis not present

## 2022-11-28 DIAGNOSIS — G4733 Obstructive sleep apnea (adult) (pediatric): Secondary | ICD-10-CM | POA: Insufficient documentation

## 2022-11-28 DIAGNOSIS — I110 Hypertensive heart disease with heart failure: Secondary | ICD-10-CM | POA: Insufficient documentation

## 2022-11-28 DIAGNOSIS — E1140 Type 2 diabetes mellitus with diabetic neuropathy, unspecified: Secondary | ICD-10-CM | POA: Insufficient documentation

## 2022-11-28 DIAGNOSIS — I2510 Atherosclerotic heart disease of native coronary artery without angina pectoris: Secondary | ICD-10-CM | POA: Diagnosis not present

## 2022-11-28 DIAGNOSIS — R42 Dizziness and giddiness: Secondary | ICD-10-CM | POA: Insufficient documentation

## 2022-11-28 DIAGNOSIS — I10 Essential (primary) hypertension: Secondary | ICD-10-CM

## 2022-11-28 DIAGNOSIS — E669 Obesity, unspecified: Secondary | ICD-10-CM | POA: Diagnosis not present

## 2022-11-28 DIAGNOSIS — I5022 Chronic systolic (congestive) heart failure: Secondary | ICD-10-CM

## 2022-11-28 DIAGNOSIS — Z7902 Long term (current) use of antithrombotics/antiplatelets: Secondary | ICD-10-CM | POA: Diagnosis not present

## 2022-11-28 DIAGNOSIS — E119 Type 2 diabetes mellitus without complications: Secondary | ICD-10-CM

## 2022-11-28 MED ORDER — LUMIGAN 0.01 % OP SOLN: 1.0000 [drp] | Freq: Every day | OPHTHALMIC | 6 refills | Status: DC

## 2022-11-28 MED ORDER — TELMISARTAN 20 MG PO TABS: 20.0000 mg | ORAL_TABLET | Freq: Every day | ORAL | 3 refills | Status: DC

## 2022-11-28 MED ORDER — METOPROLOL SUCCINATE ER 25 MG PO TB24: 25.0000 mg | ORAL_TABLET | Freq: Every day | ORAL | 0 refills | Status: DC

## 2022-11-28 NOTE — Progress Notes (Signed)
   11/28/22 1049  ReDS Vest / Clip  Station Marker D  Ruler Value 43  ReDS Value Range < 36  ReDS Actual Value 20

## 2022-11-28 NOTE — Patient Instructions (Addendum)
It was a pleasure meeting you today!   You can start taking docusate (colace) daily as a stool softener   You can go to the [facility] outpatient pharmacy in the [place] [facility] to pay your pharmacy bill. It is located on the first floor of that building.    If you receive a satisfaction survey regarding the Heart Failure Clinic, please take the time to fill it out. This way we can continue to provide excellent care and make any changes that need to be made.

## 2022-11-30 ENCOUNTER — Encounter: Payer: Self-pay | Admitting: Oncology

## 2022-11-30 ENCOUNTER — Other Ambulatory Visit: Payer: Self-pay

## 2022-11-30 ENCOUNTER — Ambulatory Visit: Payer: Medicare Other | Admitting: Internal Medicine

## 2022-11-30 ENCOUNTER — Ambulatory Visit: Payer: Medicare Other | Attending: Medical | Admitting: Medical

## 2022-11-30 ENCOUNTER — Encounter: Payer: Self-pay | Admitting: Medical

## 2022-11-30 ENCOUNTER — Encounter: Payer: Self-pay | Admitting: Internal Medicine

## 2022-11-30 VITALS — BP 138/76 | HR 82 | Ht 66.0 in | Wt 205.8 lb

## 2022-11-30 VITALS — BP 98/70 | HR 69 | Ht 66.0 in | Wt 206.1 lb

## 2022-11-30 DIAGNOSIS — I952 Hypotension due to drugs: Secondary | ICD-10-CM | POA: Diagnosis not present

## 2022-11-30 DIAGNOSIS — E785 Hyperlipidemia, unspecified: Secondary | ICD-10-CM

## 2022-11-30 DIAGNOSIS — I5022 Chronic systolic (congestive) heart failure: Secondary | ICD-10-CM

## 2022-11-30 DIAGNOSIS — Z79899 Other long term (current) drug therapy: Secondary | ICD-10-CM

## 2022-11-30 DIAGNOSIS — E782 Mixed hyperlipidemia: Secondary | ICD-10-CM

## 2022-11-30 DIAGNOSIS — E1169 Type 2 diabetes mellitus with other specified complication: Secondary | ICD-10-CM | POA: Diagnosis not present

## 2022-11-30 DIAGNOSIS — I25112 Atherosclerotic heart disease of native coronary artery with refractory angina pectoris: Secondary | ICD-10-CM | POA: Diagnosis not present

## 2022-11-30 DIAGNOSIS — I2510 Atherosclerotic heart disease of native coronary artery without angina pectoris: Secondary | ICD-10-CM | POA: Diagnosis not present

## 2022-11-30 DIAGNOSIS — I214 Non-ST elevation (NSTEMI) myocardial infarction: Secondary | ICD-10-CM

## 2022-11-30 DIAGNOSIS — Z09 Encounter for follow-up examination after completed treatment for conditions other than malignant neoplasm: Secondary | ICD-10-CM

## 2022-11-30 MED ORDER — CARVEDILOL 3.125 MG PO TABS
3.1250 mg | ORAL_TABLET | Freq: Two times a day (BID) | ORAL | 3 refills | Status: DC
  Filled 2022-11-30 (×2): qty 180, 90d supply, fill #0
  Filled 2023-02-23: qty 180, 90d supply, fill #1
  Filled 2023-05-24: qty 180, 90d supply, fill #2

## 2022-11-30 MED ORDER — TICAGRELOR 90 MG PO TABS
90.0000 mg | ORAL_TABLET | Freq: Two times a day (BID) | ORAL | 3 refills | Status: DC
  Filled 2022-11-30 – 2022-12-15 (×2): qty 180, 90d supply, fill #0

## 2022-11-30 MED ORDER — LOSARTAN POTASSIUM 25 MG PO TABS
12.5000 mg | ORAL_TABLET | Freq: Every day | ORAL | 3 refills | Status: DC
  Filled 2022-11-30 – 2022-12-15 (×2): qty 45, 90d supply, fill #0

## 2022-11-30 MED ORDER — ROSUVASTATIN CALCIUM 5 MG PO TABS
5.0000 mg | ORAL_TABLET | Freq: Every day | ORAL | 3 refills | Status: DC
  Filled 2022-11-30: qty 90, 90d supply, fill #0

## 2022-11-30 NOTE — H&P (View-Only) (Signed)
Cardiology Office Note:    Date:  11/30/2022   ID:  [name], DOB Jul 02, 1945, MRN [id number]  PCP:  Sherlene Shams, MD  [facility] Cardiologist:  None  [facility] Electrophysiologist:  None   Referring MD: Sherlene Shams, MD   Chief Complaint: Hospital follow-up  History of Present Illness:    [name] is a 77 y.o. 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  Patient was sent to the ER 11/17/2023 for chest pain and elevated troponin.  High-sensitivity troponin elevated to 1569.  EKG showed ST depression in the lateral leads.  Heart cath showed diffuse and moderately calcified arteries with severe two-vessel CAD.  Culprit was likely plaque rupture from OM1 and mid left circumflex treated with successful bifurcation angioplasty and kissing stent placement to OM1/mid left circumflex using 2 drug-eluting stents.  Patient was started on DAPT for least 12 months with aspirin and Brilinta.  Echo showed EF 35 to 40%, mild hypokinesis of the inferoseptal inferior wall and moderate HK of the entire anterolateral, inferolateral and anterior walls with normal RV SF and RVSP.  Plan for staged PCI in a few weeks.  Today, the patient denies chest pain. She moderate some SOB. Feels SOB started after starting Brilinta. Breathing improves throughout the day. BP is low today. She is feeling dizzy and lightheaded every day. We dicussed prior PCI. Cath site is stable.   Past Medical History:  Diagnosis Date   Asthma    Asthma due to environmental allergies    grass, mold, trees, dust   Cataract    Cellulitis    LEFT LEG   Diabetes mellitus    CONTROLLED ON DIET ALONE   Edema leg    LEFT LEG...CHRONIC   Endometrial hyperplasia    External hemorrhoid    Fatty liver    Glaucoma     Hepatic cyst    STABLE PER 05/20/2008 ULTRASOUND   Hypertension    borderline...controlled since taking herself of HCTZ IN JULY   IBS (irritable bowel syndrome)    Murmur, cardiac    Neuropathy    OSA on CPAP    Osteoarthritis    Psoriasis    Sleep apnea    CPAP    Past Surgical History:  Procedure Laterality Date   BONE DENSITY TEST  01/17/2004   CATARACT EXTRACTION, BILATERAL Bilateral    May 2023 and June 2023   CORONARY STENT INTERVENTION N/A 11/20/2022   Procedure: CORONARY STENT INTERVENTION;  Surgeon: Iran Ouch, MD;  Location: [facility] INVASIVE CV [facility];  Service: Cardiovascular;  Laterality: N/A;   HYSTEROSCOPY WITH D & C  01/16/2005   BC OF ABNORAL PAP AND ENDOMETRIAL HYPERPLASIA   LEFT HEART CATH AND CORONARY ANGIOGRAPHY N/A 11/20/2022   Procedure: LEFT HEART CATH AND CORONARY ANGIOGRAPHY;  Surgeon: Iran Ouch, MD;  Location: [facility] INVASIVE CV LAB;  Service: Cardiovascular;  Laterality: N/A;   ONE VAGINAL DELIVERY     UMBILICAL HERNIA REPAIR  01/17/2008   VAGINAL HYSTERECTOMY  01/17/2008    Current Medications: Current Meds  Medication Sig   albuterol (VENTOLIN HFA) 108 (90 Base) MCG/ACT inhaler Inhale 2 puffs into the lungs every 6 (six) hours as needed for wheezing or shortness of breath.   ALPRAZolam (XANAX) 0.25 MG tablet Take 1 tablet (0.25 mg total)  by mouth 2 (two) times daily as needed for anxiety.   aspirin EC 81 MG tablet Take 1 tablet (81 mg total) by mouth daily. Swallow whole.   budesonide (PULMICORT) 180 MCG/ACT inhaler Inhale 2 puffs into the lungs 2 (two) times daily.   cetirizine (ZYRTEC) 10 MG tablet Take 10 mg by mouth daily.   LUMIGAN 0.01 % SOLN Place 1 drop into both eyes at bedtime.   MILK THISTLE PO Take 1 capsule by mouth daily.    nitroGLYCERIN (NITROSTAT) 0.4 MG SL tablet Place 1 tablet (0.4 mg total) under the tongue every 5 (five) minutes for up to 3 doses as needed for chest pain.   ondansetron (ZOFRAN) 4 MG tablet Take 1  tablet (4 mg total) by mouth every 8 (eight) hours as needed for nausea or vomiting.   Probiotic Product (PROBIOTIC COMPLEX ACIDOPHILUS PO) Take 1 capsule by mouth daily.   sodium chloride (OCEAN) 0.65 % SOLN nasal spray Place 1 spray into the nose as needed.   tildrakizumab-asmn (ILUMYA) 100 MG/ML subcutaneous injection Inject 100 mg into the skin every 3 (three) months.   [DISCONTINUED] carvedilol (COREG) 6.25 MG tablet Take 1 tablet (6.25 mg total) by mouth 2 (two) times daily with a meal.   [DISCONTINUED] losartan (COZAAR) 25 MG tablet Take 1 tablet (25 mg total) by mouth daily.   [DISCONTINUED] rosuvastatin (CRESTOR) 5 MG tablet Take 1 tablet (5 mg total) by mouth daily.   [DISCONTINUED] ticagrelor (BRILINTA) 90 MG TABS tablet Take 1 tablet (90 mg total) by mouth 2 (two) times daily.     Allergies:   Meloxicam, Statins, Sulfa drugs cross reactors, and Zostavax [zoster vaccine live]   Social History   Socioeconomic History   Marital status: Married    Spouse name: Not on file   Number of children: 2   Years of education: Not on file   Highest education level: Some college, no degree  Occupational History   Occupation: RETIRED  Tobacco Use   Smoking status: Former    Current packs/day: 0.00    Types: Cigarettes    Quit date: 01/16/1978    Years since quitting: 44.9   Smokeless tobacco: Never   Tobacco comments:    REMOTELY QUIT IN 1987 AFTER A FEW YEARS OF USE  Vaping Use   Vaping status: Never Used  Substance and Sexual Activity   Alcohol use: No   Drug use: No   Sexual activity: Not on file  Other Topics Concern   Not on file  Social History Narrative   Lives with husband.   Social Determinants of Health   Financial Resource Strain: Low Risk  (07/30/2018)   Overall Financial Resource Strain (CARDIA)    Difficulty of Paying Living Expenses: Not hard at all  Food Insecurity: No Food Insecurity (11/22/2022)   Hunger Vital Sign    Worried About Running Out of Food in the  Last Year: Never true    Ran Out of Food in the Last Year: Never true  Transportation Needs: No Transportation Needs (11/22/2022)   PRAPARE - Administrator, Civil Service (Medical): No    Lack of Transportation (Non-Medical): No  Recent Concern: Transportation Needs - Unmet Transportation Needs (11/21/2022)   PRAPARE - Transportation    Lack of Transportation (Medical): Yes    Lack of Transportation (Non-Medical): Yes  Physical Activity: Unknown (07/30/2018)   Exercise Vital Sign    Days of Exercise per Week: 0 days    Minutes of  Exercise per Session: Not on file  Stress: No Stress Concern Present (07/30/2018)   [employer] of Occupational Health - Occupational Stress Questionnaire    Feeling of Stress : Not at all  Social Connections: Not on file     Family History: The patient's family history includes Heart disease (age of onset: 35) in her father; Mental illness in her mother. There is no history of Cancer or Breast cancer.  ROS:   Please see the history of present illness.     All other systems reviewed and are negative.  EKGs/Labs/Other Studies Reviewed:    The following studies were reviewed today:  LHC 11/2022   Prox RCA lesion is 30% stenosed.   Dist RCA lesion is 70% stenosed with 90% stenosed side branch in RPDA.   Prox LAD to Mid LAD lesion is 30% stenosed.   1st Diag lesion is 50% stenosed.   Mid LAD lesion is 40% stenosed.   Mid LAD to Dist LAD lesion is 30% stenosed.   Ost LM lesion is 30% stenosed.   1st Mrg lesion is 99% stenosed.   Prox Cx to Mid Cx lesion is 90% stenosed.   A drug-eluting stent was successfully placed using a STENT ONYX FRONTIER 2.5X26.   A drug-eluting stent was successfully placed using a STENT ONYX FRONTIER 2.5X22.   Post intervention, there is a 0% residual stenosis.   Post intervention, there is a 0% residual stenosis.   1.  Diffuse and moderately calcified coronary arteries with severe two-vessel coronary artery  disease.  The culprit for myocardial infarction seems to be plaque rupture and OM1 and mid left circumflex.  In addition, there is severe stenosis in the distal RCA extending into the ostium of the right PDA.  The LAD has moderate disease in multiple areas. 2.  Left ventricular angiography was not performed.  EF was moderately reduced by echo.  LVEDP was mildly elevated. 3.  Successful complex bifurcation angioplasty and kissing stent placement to OM1/mid left circumflex using 2 drug-eluting stents.   Recommendations: Dual antiplatelet therapy for at least 12 months and preferably longer. The patient is intolerant to statins but will try small dose rosuvastatin 5 mg daily. I switch Toprol to carvedilol for better blood pressure control. Recommend staged RCA PCI in few weeks.  Echo 11/2022  1. Left ventricular ejection fraction, by estimation, is 35 to 40%. The  left ventricle has moderately decreased function. The left ventricle  demonstrates regional wall motion abnormalities (see scoring  diagram/findings for description). There is mild  concentric left ventricular hypertrophy. Left ventricular diastolic  parameters are indeterminate. There is mild hypokinesis of the left  ventricular, entire inferoseptal wall and inferior wall. There is moderate  hypokinesis of the left ventricular, entire   anterolateral wall, inferolateral wall and anterior wall.   2. Right ventricular systolic function is normal. The right ventricular  size is not well visualized. There is normal pulmonary artery systolic  pressure.   3. The mitral valve is normal in structure. Mild mitral valve  regurgitation. No evidence of mitral stenosis.   4. The aortic valve was not well visualized. There is moderate  calcification of the aortic valve. There is mild thickening of the aortic  valve. Aortic valve regurgitation is not visualized. Mild aortic valve  stenosis.   5. The inferior vena cava is normal in size with  greater than 50%  respiratory variability, suggesting right atrial pressure of 3 mmHg.   Comparison(s): No prior Echocardiogram.  EKG:  EKG is ordered today.  The ekg ordered today demonstrates NSR 69bpm, PVC, TWI lateral leads  Recent Labs: 11/13/2022: TSH 3.43 11/17/2022: ALT 20 11/21/2022: BUN 15; Creatinine, Ser 0.74; Hemoglobin 12.8; Platelets 348; Potassium 3.6; Sodium 135  Recent Lipid Panel    Component Value Date/Time   CHOL 189 11/13/2022 0845   TRIG 150.0 (H) 11/13/2022 0845   HDL 51.40 11/13/2022 0845   CHOLHDL 4 11/13/2022 0845   VLDL 30.0 11/13/2022 0845   LDLCALC 108 (H) 11/13/2022 0845   LDLDIRECT 127.0 11/13/2022 0845    Physical Exam:    VS:  BP 98/70 (BP Location: Left Arm, Patient Position: Sitting, Cuff Size: Normal)   Pulse 69   Ht 5\' 6"  (1.676 m)   Wt 206 lb 2 oz (93.5 kg)   SpO2 99%   BMI 33.27 kg/m     Wt Readings from Last 3 Encounters:  11/30/22 206 lb 2 oz (93.5 kg)  11/30/22 205 lb 12.8 oz (93.4 kg)  11/28/22 207 lb (93.9 kg)     GEN:  Well nourished, well developed in no acute distress HEENT: Normal NECK: No JVD; No carotid bruits LYMPHATICS: No lymphadenopathy CARDIAC: RRR, no murmurs, rubs, gallops RESPIRATORY:  Clear to auscultation without rales, wheezing or rhonchi  ABDOMEN: Soft, non-tender, non-distended MUSCULOSKELETAL:  No edema; No deformity  SKIN: Warm and dry NEUROLOGIC:  Alert and oriented x 3 PSYCHIATRIC:  Normal affect   ASSESSMENT:    1. Coronary artery disease involving native coronary artery of native heart without angina pectoris   2. NSTEMI (non-ST elevated myocardial infarction) (HCC)   3. Hypotension due to drugs   4. Chronic systolic heart failure (HCC)   5. Hyperlipidemia, mixed   6. Medication management    PLAN:    In order of problems listed above:  CAD/NSTEMI Recent admission for NSTEMI found to have diffuse and moderately calcified coronary arteries with severe 2V CAD. Culprit for MI was plaque  rupture in OM1/Lcx. There ws severe stenosis in the distal RCA extending into the ostium of the right PDA. She was treated with complex bifurcation angioplasty and kissing stent placement to the OM1/mid left circumflex using 2 drug-eluting stents. Plan for staged PCI to the RCA next week. She was started on ASA and Brilinta. When she started the Brilinta she noted shortness of breath. This has persistent, but is slowly improving. The patient denies chest pain. Cath site has healed well. We will set patient up for staged PCI to the RCA. Continue Aspirin, Brilinta, coreg, Losartan, Crestor, and SL NTG.   Hypotension BP is 98/70 and she reports dizziness and lightheadedness daily. I will decrease Coreg to 3.12,5mg  BID and Losartan to 12.5mg  daily. We will reassess at follow-up  HFrEF/ICM The patient is euvolemic on exam. Echo showed LVEF 35-40%, mild LVH, mild MR. Decrease Coreg and Losartan as above. Low BP limiting GDMT. Plan to re-check a limited echo in about 2 months.   HLD She was intolerant to Lipitor and Zetia. She is tolerating low dose Crestor. Re-check lipids at follow-up.   Disposition: Follow up in 3 week(s) with MD/APP   Shared Decision Making/Informed Consent   Informed Consent   Shared Decision Making/Informed Consent The risks [stroke (1 in 1000), death (1 in 1000), kidney failure [usually temporary] (1 in 500), bleeding (1 in 200), allergic reaction [possibly serious] (1 in 200)], benefits (diagnostic support and management of coronary artery disease) and alternatives of a cardiac catheterization were discussed in detail with  Ms. Wiehe and she is willing to proceed.      Signed, Jovonda Selner David Stall, PA-C  11/30/2022 4:38 PM    [facility]

## 2022-11-30 NOTE — Progress Notes (Signed)
Subjective:  Patient ID: [name], female    DOB: 04-13-1945  Age: 77 y.o. 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 Diagnoses of Hyperlipidemia associated with type 2 diabetes mellitus (HCC) and Hospital discharge follow-up were also pertinent to this visit.   HPI [name] presents for  Chief Complaint  Patient presents with   Hospitalization Follow-up    [name] was admitted to [facility] on Nov 1 with positive troponins after presenting to regularly scheduled OV  with me on  same day with recent history of SSCP during the previous 48 hours.  She was diagnosed with NSTEMI .  Cardiac cath revealed diffuse and moderately calcified coronary arteries with severe two-vessel coronary artery disease.  The culprit for myocardial infarction was considered to be due to plaque rupture and OM1 and mid left circumflex occlusion .  In addition, she was noted to have  severe stenosis in the distal RCA extending into the ostium of the right PDA.  The LAD  was also noted to have moderate disease in multiple areas.  She underwent    Successful complex bifurcation angioplasty and kissing stent placement to OM1/mid left circumflex using 2 drug-eluting stents.  She was discharged on Nov 5 with medication regimen including Brilinta, rosuvastatin nd carvedilol,  with plans to repeat cardiac cath in 2 weeks to reevaluate the RCA lesion   She is accompanied today by her husband. She feels generally well and has been taking the Brilinta, rosuvastatin 5 mg and carvedilol (metoprolol stopped) as directed. She has not had a recurrent of the chest pain that she reported at her OV       Outpatient Medications Prior to Visit  Medication Sig Dispense Refill   albuterol (VENTOLIN HFA) 108 (90 Base) MCG/ACT inhaler Inhale 2 puffs into the lungs every 6 (six) hours as needed for wheezing or  shortness of breath.     ALPRAZolam (XANAX) 0.25 MG tablet Take 1 tablet (0.25 mg total) by mouth 2 (two) times daily as needed for anxiety. 30 tablet 0   aspirin EC 81 MG tablet Take 1 tablet (81 mg total) by mouth daily. Swallow whole. 30 tablet 12   bimatoprost (LUMIGAN) 0.01 % SOLN Place 1 drop into both eyes at bedtime. (Patient not taking: Reported on 11/30/2022) 2.5 mL 6   budesonide (PULMICORT) 180 MCG/ACT inhaler Inhale 2 puffs into the lungs 2 (two) times daily.     cetirizine (ZYRTEC) 10 MG tablet Take 10 mg by mouth daily.     EPINEPHrine 0.3 mg/0.3 mL IJ SOAJ injection as needed. (Patient not taking: Reported on 11/30/2022)     LUMIGAN 0.01 % SOLN Place 1 drop into both eyes at bedtime.     MILK THISTLE PO Take 1 capsule by mouth daily.      nitroGLYCERIN (NITROSTAT) 0.4 MG SL tablet Place 1 tablet (0.4 mg total) under the tongue every 5 (five) minutes for up to 3 doses as needed for chest pain. 25 tablet 0   ondansetron (ZOFRAN) 4 MG tablet Take 1 tablet (4 mg total) by mouth every 8 (eight) hours as needed for nausea or vomiting. 30 tablet 02   Probiotic Product (PROBIOTIC COMPLEX ACIDOPHILUS PO) Take 1 capsule by mouth daily.     sodium chloride (OCEAN) 0.65 % SOLN nasal spray Place 1 spray into the nose as needed.     tildrakizumab-asmn (ILUMYA)  100 MG/ML subcutaneous injection Inject 100 mg into the skin every 3 (three) months.     carvedilol (COREG) 6.25 MG tablet Take 1 tablet (6.25 mg total) by mouth 2 (two) times daily with a meal. 60 tablet 0   losartan (COZAAR) 25 MG tablet Take 1 tablet (25 mg total) by mouth daily. 30 tablet 0   rosuvastatin (CRESTOR) 5 MG tablet Take 1 tablet (5 mg total) by mouth daily. 30 tablet 0   ticagrelor (BRILINTA) 90 MG TABS tablet Take 1 tablet (90 mg total) by mouth 2 (two) times daily. 60 tablet 0   metoprolol succinate (TOPROL-XL) 25 MG 24 hr tablet Take 1 tablet (25 mg total) by mouth at bedtime. (Patient not taking: Reported on 11/30/2022)  30 tablet 0   telmisartan (MICARDIS) 20 MG tablet Take 1 tablet (20 mg total) by mouth daily. (Patient not taking: Reported on 11/30/2022) 90 tablet 3   No facility-administered medications prior to visit.    Review of Systems;  Patient denies headache, fevers, malaise, unintentional weight loss, skin rash, eye pain, sinus congestion and sinus pain, sore throat, dysphagia,  hemoptysis , cough, dyspnea, wheezing, chest pain, palpitations, orthopnea, edema, abdominal pain, nausea, melena, diarrhea, constipation, flank pain, dysuria, hematuria, urinary  Frequency, nocturia, numbness, tingling, seizures,  Focal weakness, Loss of consciousness,  Tremor, insomnia, depression, anxiety, and suicidal ideation.      Objective:  BP 138/76   Pulse 82   Ht 5\' 6"  (1.676 m)   Wt 205 lb 12.8 oz (93.4 kg)   SpO2 98%   BMI 33.22 kg/m   BP Readings from Last 3 Encounters:  11/30/22 98/70  11/30/22 138/76  11/28/22 104/63    Wt Readings from Last 3 Encounters:  11/30/22 206 lb 2 oz (93.5 kg)  11/30/22 205 lb 12.8 oz (93.4 kg)  11/28/22 207 lb (93.9 kg)    Physical Exam Vitals reviewed.  Constitutional:      General: She is not in acute distress.    Appearance: Normal appearance. She is normal weight. She is not ill-appearing, toxic-appearing or diaphoretic.  HENT:     Head: Normocephalic.  Eyes:     General: No scleral icterus.       Right eye: No discharge.        Left eye: No discharge.     Conjunctiva/sclera: Conjunctivae normal.  Cardiovascular:     Rate and Rhythm: Normal rate and regular rhythm.     Heart sounds: Normal heart sounds.  Pulmonary:     Effort: Pulmonary effort is normal. No respiratory distress.     Breath sounds: Normal breath sounds.  Musculoskeletal:        General: Normal range of motion.  Skin:    General: Skin is warm and dry.  Neurological:     General: No focal deficit present.     Mental Status: She is alert and oriented to person, place, and time.  Mental status is at baseline.  Psychiatric:        Mood and Affect: Mood normal.        Behavior: Behavior normal.        Thought Content: Thought content normal.        Judgment: Judgment normal.    Lab Results  Component Value Date   HGBA1C 7.1 (H) 11/13/2022   HGBA1C 7.2 (H) 06/15/2022   HGBA1C 7.1 (H) 03/14/2022    Lab Results  Component Value Date   CREATININE 1.10 (H) 11/30/2022   CREATININE 0.74  11/21/2022   CREATININE 0.81 11/17/2022    Lab Results  Component Value Date   WBC 11.3 (H) 11/30/2022   HGB 14.1 11/30/2022   HCT 42.7 11/30/2022   PLT 396 11/30/2022   GLUCOSE 140 (H) 11/30/2022   CHOL 189 11/13/2022   TRIG 150.0 (H) 11/13/2022   HDL 51.40 11/13/2022   LDLDIRECT 127.0 11/13/2022   LDLCALC 108 (H) 11/13/2022   ALT 20 11/17/2022   AST 26 11/17/2022   NA 141 11/30/2022   K 5.5 (H) 11/30/2022   CL 105 11/30/2022   CREATININE 1.10 (H) 11/30/2022   BUN 18 11/30/2022   CO2 21 11/30/2022   TSH 3.43 11/13/2022   INR 1.1 11/17/2022   HGBA1C 7.1 (H) 11/13/2022   MICROALBUR <0.7 06/15/2022    ECHOCARDIOGRAM COMPLETE  Result Date: 11/18/2022    ECHOCARDIOGRAM REPORT   Patient Name:   RANDALYN ELLERS Date of Exam: 11/18/2022 Medical Rec #:  [id number]     Height:       66.0 in Accession #:    [id number]    Weight:       215.1 lb Date of Birth:  03/20/1945      BSA:          2.063 m Patient Age:    77 years      BP:           156/92 mmHg Patient Gender: F             HR:           94 bpm. Exam Location:  [facility] Procedure: 2D Echo, Cardiac Doppler, Color Doppler and Intracardiac            Opacification Agent Indications:    NSTEMI  History:        Patient has no prior history of Echocardiogram examinations.                 PAD, Signs/Symptoms:Chest Pain; Risk Factors:Hypertension,                 Diabetes, Dyslipidemia, Former Smoker and Sleep Apnea.  Sonographer:    Dondra Prader RVT RCS Referring Phys: [id number] Andris Baumann  Sonographer Comments: Technically difficult  study due to poor echo windows, suboptimal parasternal window, suboptimal apical window, suboptimal subcostal window and patient is obese. Image acquisition challenging due to patient body habitus. IMPRESSIONS  1. Left ventricular ejection fraction, by estimation, is 35 to 40%. The left ventricle has moderately decreased function. The left ventricle demonstrates regional wall motion abnormalities (see scoring diagram/findings for description). There is mild concentric left ventricular hypertrophy. Left ventricular diastolic parameters are indeterminate. There is mild hypokinesis of the left ventricular, entire inferoseptal wall and inferior wall. There is moderate hypokinesis of the left ventricular, entire  anterolateral wall, inferolateral wall and anterior wall.  2. Right ventricular systolic function is normal. The right ventricular size is not well visualized. There is normal pulmonary artery systolic pressure.  3. The mitral valve is normal in structure. Mild mitral valve regurgitation. No evidence of mitral stenosis.  4. The aortic valve was not well visualized. There is moderate calcification of the aortic valve. There is mild thickening of the aortic valve. Aortic valve regurgitation is not visualized. Mild aortic valve stenosis.  5. The inferior vena cava is normal in size with greater than 50% respiratory variability, suggesting right atrial pressure of 3 mmHg. Comparison(s): No prior Echocardiogram. Conclusion(s)/Recommendation(s): Very difficult to see wall motion, even with use  of echo contrast. Moderately reduced LVEF, anterior wall not well visualized, in contrast images lateral wall appears slightly more hypokinetic than other walls, but images  are poor sensitivity for wall motion. FINDINGS  Left Ventricle: Left ventricular ejection fraction, by estimation, is 35 to 40%. The left ventricle has moderately decreased function. The left ventricle demonstrates regional wall motion abnormalities. Mild  hypokinesis of the left ventricular, entire inferoseptal wall and inferior wall. Moderate hypokinesis of the left ventricular, entire anterolateral wall, inferolateral wall and anterior wall. Definity contrast agent was given IV to delineate the left ventricular endocardial borders. The left ventricular internal cavity size was normal in size. There is mild concentric left ventricular hypertrophy. Left ventricular diastolic parameters are indeterminate. Right Ventricle: The right ventricular size is not well visualized. Right vetricular wall thickness was not well visualized. Right ventricular systolic function is normal. There is normal pulmonary artery systolic pressure. The tricuspid regurgitant velocity is 1.86 m/s, and with an assumed right atrial pressure of 3 mmHg, the estimated right ventricular systolic pressure is 16.8 mmHg. Left Atrium: Left atrial size was normal in size. Right Atrium: Right atrial size was not well visualized. Pericardium: There is no evidence of pericardial effusion. Mitral Valve: The mitral valve is normal in structure. Mild mitral valve regurgitation. No evidence of mitral valve stenosis. Tricuspid Valve: The tricuspid valve is normal in structure. Tricuspid valve regurgitation is trivial. No evidence of tricuspid stenosis. Aortic Valve: The aortic valve was not well visualized. There is moderate calcification of the aortic valve. There is mild thickening of the aortic valve. Aortic valve regurgitation is not visualized. Mild aortic stenosis is present. Aortic valve mean gradient measures 8.5 mmHg. Aortic valve peak gradient measures 15.1 mmHg. Aortic valve area, by VTI measures 1.07 cm. Pulmonic Valve: The pulmonic valve was not well visualized. Pulmonic valve regurgitation is not visualized. No evidence of pulmonic stenosis. Aorta: The aortic root, ascending aorta, aortic arch and descending aorta are all structurally normal, with no evidence of dilitation or obstruction. Venous:  The inferior vena cava is normal in size with greater than 50% respiratory variability, suggesting right atrial pressure of 3 mmHg. IAS/Shunts: The interatrial septum was not well visualized.  LEFT VENTRICLE PLAX 2D LVIDd:         4.75 cm   Diastology LVIDs:         3.55 cm   LV e' medial:    6.85 cm/s LV PW:         1.25 cm   LV E/e' medial:  16.1 LV IVS:        1.15 cm   LV e' lateral:   6.53 cm/s LVOT diam:     1.90 cm   LV E/e' lateral: 16.8 LV SV:         35 LV SV Index:   17 LVOT Area:     2.84 cm  RIGHT VENTRICLE             IVC RV S prime:     12.10 cm/s  IVC diam: 1.60 cm TAPSE (M-mode): 2.0 cm LEFT ATRIUM             Index LA diam:        2.80 cm 1.36 cm/m LA Vol (A2C):   43.1 ml 20.89 ml/m LA Vol (A4C):   55.4 ml 26.86 ml/m LA Biplane Vol: 50.6 ml 24.53 ml/m  AORTIC VALVE  PULMONIC VALVE AV Area (Vmax):    1.24 cm      PV Vmax:       1.24 m/s AV Area (Vmean):   1.19 cm      PV Peak grad:  6.2 mmHg AV Area (VTI):     1.07 cm AV Vmax:           194.50 cm/s AV Vmean:          135.500 cm/s AV VTI:            0.324 m AV Peak Grad:      15.1 mmHg AV Mean Grad:      8.5 mmHg LVOT Vmax:         85.00 cm/s LVOT Vmean:        56.900 cm/s LVOT VTI:          0.123 m LVOT/AV VTI ratio: 0.38  AORTA Ao Root diam: 2.30 cm Ao Asc diam:  2.80 cm MITRAL VALVE                TRICUSPID VALVE MV Area (PHT): 6.27 cm     TR Peak grad:   13.8 mmHg MV Decel Time: 121 msec     TR Vmax:        186.00 cm/s MV E velocity: 110.00 cm/s MV A velocity: 103.00 cm/s  SHUNTS MV E/A ratio:  1.07         Systemic VTI:  0.12 m                             Systemic Diam: 1.90 cm Jodelle Red MD Electronically signed by Jodelle Red MD Signature Date/Time: 11/18/2022/12:16:38 PM    Final     Assessment & Plan:  .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  Repeat cath  planned to address RCA stenosis    Hyperlipidemia associated with type 2 diabetes mellitus (HCC) Assessment & Plan: Historically untreated due to statin intolerance.  She had also declined  trial of Zetia and PCSK 9 inhibitor, but has been tolerating rosuvastatin disnce discharge form hospital for NSTEMI. Repeat LFTS needed     Orders: -     Comprehensive metabolic panel; Future  Hospital discharge follow-up Assessment & Plan: Patient is stable post discharge and has no new issues or questions about discharge plans at the visit today for hospital follow up.  I have reviewed the records from the hospital admission in detail with patient today.       I provided 30 minutes of face-to-face time during this encounter reviewing patient's last visit with me, patient's  most recent visit with cardiology,  nephrology,  and neurology,  recent surgical and non surgical procedures, previous  labs and imaging studies, counseling on currently addressed issues,  and post visit ordering to diagnostics and therapeutics .   Follow-up: Return in about 3 months (around 02/19/2023) for follow up diabetes.   Sherlene Shams, MD

## 2022-11-30 NOTE — Patient Instructions (Addendum)
Medication Instructions:  Your physician recommends the following medication changes.   DECREASE: Coreg to 3.125 MG twice daily Losartan to 12.5 MG daily   *If you need a refill on your cardiac medications before your next appointment, please call your pharmacy*   Lab Work: Your provider would like for you to have following labs drawn today CBC and BMP.   If you have labs (blood work) drawn today and your tests are completely normal, you will receive your results only by: MyChart Message (if you have MyChart) OR A paper copy in the mail If you have any lab test that is abnormal or we need to change your treatment, we will call you to review the results.   Testing/Procedures:  [place] A DEPT OF [facility] AT [place] [address] [place] [address] Dept: [phone] Loc: [phone]  [name]  11/30/2022  You are scheduled for a Cardiac Catheterization on Monday, November 25 with Dr. Lorine Bears.  1. Please arrive at the [facility] Entrance of [facility], [address], [place] [address] at 8:30 am (This is 1 hour(s) prior to your procedure time).  Proceed to the Check-In Desk directly inside the entrance.  Procedure Parking: Use the entrance off of the [address] side of the hospital. Turn right upon entering and follow the driveway to parking that is directly in front of the [facility] Center. There is no valet parking available at this entrance, however there is an awning directly in front of the [facility]ascular Center for drop off/ pick up for patients.  Special note: Every effort is made to have your procedure done on time. Please understand that emergencies sometimes delay scheduled procedures.  2. Diet: Do not eat solid foods after midnight.  The patient may have clear liquids until 5am upon the day of the procedure.  4. Medication instructions in  preparation for your procedure:    Contrast Allergy: No  On the morning of your procedure, take your Brilinta/Ticagrelor and Aspirin and any morning medicines NOT listed above.  You may use sips of water.  5. Plan to go home the same day, you will only stay overnight if medically necessary. 6. Bring a current list of your medications and current insurance cards. 7. You MUST have a responsible person to drive you home. 8. Someone MUST be with you the first 24 hours after you arrive home or your discharge will be delayed. 9. Please wear clothes that are easy to get on and off and wear slip-on shoes.  Thank you for allowing [place] to care for you!   -- [place] [facility]    Follow-Up: At [facility], you and your health needs are our priority.  As part of our continuing mission to provide you with exceptional heart care, we have created designated Provider Care Teams.  These Care Teams include your primary Cardiologist (physician) and Advanced Practice Providers (APPs -  Physician Assistants and Nurse Practitioners) who all work together to provide you with the care you need, when you need it.  We recommend signing up for the patient portal called "MyChart".  Sign up information is provided on this After Visit Summary.  MyChart is used to connect with patients for Virtual Visits (Telemedicine).  Patients are able to view lab/test results, encounter notes, upcoming appointments, etc.  Non-urgent messages can be sent to your provider as well.  To learn more about what you can do with MyChart, go to [email or web address].    Your next appointment:   2 week(s) after heart cath   Provider:   You may see Dr. Kirke Corin or one of the following Advanced Practice Providers on your designated Care Team:   Nicolasa Ducking, NP Eula Listen, PA-C Cadence Fransico Michael, PA-C Charlsie Quest, NP Carlos Levering, NP

## 2022-11-30 NOTE — Patient Instructions (Signed)
Praise God for your recovery!  Please have your home blood pressure machine checked by Cadence today at your visit  We will repeat your liver enzymes around Dec 1    Return to see me in early January

## 2022-11-30 NOTE — Progress Notes (Signed)
Cardiology Office Note:    Date:  11/30/2022   ID:  NEELIE MEN, DOB 02-29-1945, MRN [id number]  PCP:  Sherlene Shams, MD  [facility] Cardiologist:  None  [facility] Electrophysiologist:  None   Referring MD: Sherlene Shams, MD   Chief Complaint: Hospital follow-up  History of Present Illness:    ALLEIGH IMAI is a 77 y.o. 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  Patient was sent to the ER 11/17/2023 for chest pain and elevated troponin.  High-sensitivity troponin elevated to 1569.  EKG showed ST depression in the lateral leads.  Heart cath showed diffuse and moderately calcified arteries with severe two-vessel CAD.  Culprit was likely plaque rupture from OM1 and mid left circumflex treated with successful bifurcation angioplasty and kissing stent placement to OM1/mid left circumflex using 2 drug-eluting stents.  Patient was started on DAPT for least 12 months with aspirin and Brilinta.  Echo showed EF 35 to 40%, mild hypokinesis of the inferoseptal inferior wall and moderate HK of the entire anterolateral, inferolateral and anterior walls with normal RV SF and RVSP.  Plan for staged PCI in a few weeks.  Today, the patient denies chest pain. She moderate some SOB. Feels SOB started after starting Brilinta. Breathing improves throughout the day. BP is low today. She is feeling dizzy and lightheaded every day. We dicussed prior PCI. Cath site is stable.   Past Medical History:  Diagnosis Date   Asthma    Asthma due to environmental allergies    grass, mold, trees, dust   Cataract    Cellulitis    LEFT LEG   Diabetes mellitus    CONTROLLED ON DIET ALONE   Edema leg    LEFT LEG...CHRONIC   Endometrial hyperplasia    External hemorrhoid    Fatty liver    Glaucoma     Hepatic cyst    STABLE PER 05/20/2008 ULTRASOUND   Hypertension    borderline...controlled since taking herself of HCTZ IN JULY   IBS (irritable bowel syndrome)    Murmur, cardiac    Neuropathy    OSA on CPAP    Osteoarthritis    Psoriasis    Sleep apnea    CPAP    Past Surgical History:  Procedure Laterality Date   BONE DENSITY TEST  01/17/2004   CATARACT EXTRACTION, BILATERAL Bilateral    May 2023 and June 2023   CORONARY STENT INTERVENTION N/A 11/20/2022   Procedure: CORONARY STENT INTERVENTION;  Surgeon: Iran Ouch, MD;  Location: [facility] INVASIVE CV LAB;  Service: Cardiovascular;  Laterality: N/A;   HYSTEROSCOPY WITH D & C  01/16/2005   BC OF ABNORAL PAP AND ENDOMETRIAL HYPERPLASIA   LEFT HEART CATH AND CORONARY ANGIOGRAPHY N/A 11/20/2022   Procedure: LEFT HEART CATH AND CORONARY ANGIOGRAPHY;  Surgeon: Iran Ouch, MD;  Location: [facility] INVASIVE CV LAB;  Service: Cardiovascular;  Laterality: N/A;   ONE VAGINAL DELIVERY     UMBILICAL HERNIA REPAIR  01/17/2008   VAGINAL HYSTERECTOMY  01/17/2008    Current Medications: Current Meds  Medication Sig   albuterol (VENTOLIN HFA) 108 (90 Base) MCG/ACT inhaler Inhale 2 puffs into the lungs every 6 (six) hours as needed for wheezing or shortness of breath.   ALPRAZolam (XANAX) 0.25 MG tablet Take 1 tablet (0.25 mg total)  by mouth 2 (two) times daily as needed for anxiety.   aspirin EC 81 MG tablet Take 1 tablet (81 mg total) by mouth daily. Swallow whole.   budesonide (PULMICORT) 180 MCG/ACT inhaler Inhale 2 puffs into the lungs 2 (two) times daily.   cetirizine (ZYRTEC) 10 MG tablet Take 10 mg by mouth daily.   LUMIGAN 0.01 % SOLN Place 1 drop into both eyes at bedtime.   MILK THISTLE PO Take 1 capsule by mouth daily.    nitroGLYCERIN (NITROSTAT) 0.4 MG SL tablet Place 1 tablet (0.4 mg total) under the tongue every 5 (five) minutes for up to 3 doses as needed for chest pain.   ondansetron (ZOFRAN) 4 MG tablet Take 1  tablet (4 mg total) by mouth every 8 (eight) hours as needed for nausea or vomiting.   Probiotic Product (PROBIOTIC COMPLEX ACIDOPHILUS PO) Take 1 capsule by mouth daily.   sodium chloride (OCEAN) 0.65 % SOLN nasal spray Place 1 spray into the nose as needed.   tildrakizumab-asmn (ILUMYA) 100 MG/ML subcutaneous injection Inject 100 mg into the skin every 3 (three) months.   [DISCONTINUED] carvedilol (COREG) 6.25 MG tablet Take 1 tablet (6.25 mg total) by mouth 2 (two) times daily with a meal.   [DISCONTINUED] losartan (COZAAR) 25 MG tablet Take 1 tablet (25 mg total) by mouth daily.   [DISCONTINUED] rosuvastatin (CRESTOR) 5 MG tablet Take 1 tablet (5 mg total) by mouth daily.   [DISCONTINUED] ticagrelor (BRILINTA) 90 MG TABS tablet Take 1 tablet (90 mg total) by mouth 2 (two) times daily.     Allergies:   Meloxicam, Statins, Sulfa drugs cross reactors, and Zostavax [zoster vaccine live]   Social History   Socioeconomic History   Marital status: Married    Spouse name: Not on file   Number of children: 2   Years of education: Not on file   Highest education level: Some college, no degree  Occupational History   Occupation: RETIRED  Tobacco Use   Smoking status: Former    Current packs/day: 0.00    Types: Cigarettes    Quit date: 01/16/1978    Years since quitting: 44.9   Smokeless tobacco: Never   Tobacco comments:    REMOTELY QUIT IN 1987 AFTER A FEW YEARS OF USE  Vaping Use   Vaping status: Never Used  Substance and Sexual Activity   Alcohol use: No   Drug use: No   Sexual activity: Not on file  Other Topics Concern   Not on file  Social History Narrative   Lives with husband.   Social Determinants of Health   Financial Resource Strain: Low Risk  (07/30/2018)   Overall Financial Resource Strain (CARDIA)    Difficulty of Paying Living Expenses: Not hard at all  Food Insecurity: No Food Insecurity (11/22/2022)   Hunger Vital Sign    Worried About Running Out of Food in the  Last Year: Never true    Ran Out of Food in the Last Year: Never true  Transportation Needs: No Transportation Needs (11/22/2022)   PRAPARE - Administrator, Civil Service (Medical): No    Lack of Transportation (Non-Medical): No  Recent Concern: Transportation Needs - Unmet Transportation Needs (11/21/2022)   PRAPARE - Transportation    Lack of Transportation (Medical): Yes    Lack of Transportation (Non-Medical): Yes  Physical Activity: Unknown (07/30/2018)   Exercise Vital Sign    Days of Exercise per Week: 0 days    Minutes of  Exercise per Session: Not on file  Stress: No Stress Concern Present (07/30/2018)   [employer] of Occupational Health - Occupational Stress Questionnaire    Feeling of Stress : Not at all  Social Connections: Not on file     Family History: The patient's family history includes Heart disease (age of onset: 48) in her father; Mental illness in her mother. There is no history of Cancer or Breast cancer.  ROS:   Please see the history of present illness.     All other systems reviewed and are negative.  EKGs/Labs/Other Studies Reviewed:    The following studies were reviewed today:  LHC 11/2022   Prox RCA lesion is 30% stenosed.   Dist RCA lesion is 70% stenosed with 90% stenosed side branch in RPDA.   Prox LAD to Mid LAD lesion is 30% stenosed.   1st Diag lesion is 50% stenosed.   Mid LAD lesion is 40% stenosed.   Mid LAD to Dist LAD lesion is 30% stenosed.   Ost LM lesion is 30% stenosed.   1st Mrg lesion is 99% stenosed.   Prox Cx to Mid Cx lesion is 90% stenosed.   A drug-eluting stent was successfully placed using a STENT ONYX FRONTIER 2.5X26.   A drug-eluting stent was successfully placed using a STENT ONYX FRONTIER 2.5X22.   Post intervention, there is a 0% residual stenosis.   Post intervention, there is a 0% residual stenosis.   1.  Diffuse and moderately calcified coronary arteries with severe two-vessel coronary artery  disease.  The culprit for myocardial infarction seems to be plaque rupture and OM1 and mid left circumflex.  In addition, there is severe stenosis in the distal RCA extending into the ostium of the right PDA.  The LAD has moderate disease in multiple areas. 2.  Left ventricular angiography was not performed.  EF was moderately reduced by echo.  LVEDP was mildly elevated. 3.  Successful complex bifurcation angioplasty and kissing stent placement to OM1/mid left circumflex using 2 drug-eluting stents.   Recommendations: Dual antiplatelet therapy for at least 12 months and preferably longer. The patient is intolerant to statins but will try small dose rosuvastatin 5 mg daily. I switch Toprol to carvedilol for better blood pressure control. Recommend staged RCA PCI in few weeks.  Echo 11/2022  1. Left ventricular ejection fraction, by estimation, is 35 to 40%. The  left ventricle has moderately decreased function. The left ventricle  demonstrates regional wall motion abnormalities (see scoring  diagram/findings for description). There is mild  concentric left ventricular hypertrophy. Left ventricular diastolic  parameters are indeterminate. There is mild hypokinesis of the left  ventricular, entire inferoseptal wall and inferior wall. There is moderate  hypokinesis of the left ventricular, entire   anterolateral wall, inferolateral wall and anterior wall.   2. Right ventricular systolic function is normal. The right ventricular  size is not well visualized. There is normal pulmonary artery systolic  pressure.   3. The mitral valve is normal in structure. Mild mitral valve  regurgitation. No evidence of mitral stenosis.   4. The aortic valve was not well visualized. There is moderate  calcification of the aortic valve. There is mild thickening of the aortic  valve. Aortic valve regurgitation is not visualized. Mild aortic valve  stenosis.   5. The inferior vena cava is normal in size with  greater than 50%  respiratory variability, suggesting right atrial pressure of 3 mmHg.   Comparison(s): No prior Echocardiogram.  EKG:  EKG is ordered today.  The ekg ordered today demonstrates NSR 69bpm, PVC, TWI lateral leads  Recent Labs: 11/13/2022: TSH 3.43 11/17/2022: ALT 20 11/21/2022: BUN 15; Creatinine, Ser 0.74; Hemoglobin 12.8; Platelets 348; Potassium 3.6; Sodium 135  Recent Lipid Panel    Component Value Date/Time   CHOL 189 11/13/2022 0845   TRIG 150.0 (H) 11/13/2022 0845   HDL 51.40 11/13/2022 0845   CHOLHDL 4 11/13/2022 0845   VLDL 30.0 11/13/2022 0845   LDLCALC 108 (H) 11/13/2022 0845   LDLDIRECT 127.0 11/13/2022 0845    Physical Exam:    VS:  BP 98/70 (BP Location: Left Arm, Patient Position: Sitting, Cuff Size: Normal)   Pulse 69   Ht 5\' 6"  (1.676 m)   Wt 206 lb 2 oz (93.5 kg)   SpO2 99%   BMI 33.27 kg/m     Wt Readings from Last 3 Encounters:  11/30/22 206 lb 2 oz (93.5 kg)  11/30/22 205 lb 12.8 oz (93.4 kg)  11/28/22 207 lb (93.9 kg)     GEN:  Well nourished, well developed in no acute distress HEENT: Normal NECK: No JVD; No carotid bruits LYMPHATICS: No lymphadenopathy CARDIAC: RRR, no murmurs, rubs, gallops RESPIRATORY:  Clear to auscultation without rales, wheezing or rhonchi  ABDOMEN: Soft, non-tender, non-distended MUSCULOSKELETAL:  No edema; No deformity  SKIN: Warm and dry NEUROLOGIC:  Alert and oriented x 3 PSYCHIATRIC:  Normal affect   ASSESSMENT:    1. Coronary artery disease involving native coronary artery of native heart without angina pectoris   2. NSTEMI (non-ST elevated myocardial infarction) (HCC)   3. Hypotension due to drugs   4. Chronic systolic heart failure (HCC)   5. Hyperlipidemia, mixed   6. Medication management    PLAN:    In order of problems listed above:  CAD/NSTEMI Recent admission for NSTEMI found to have diffuse and moderately calcified coronary arteries with severe 2V CAD. Culprit for MI was plaque  rupture in OM1/Lcx. There ws severe stenosis in the distal RCA extending into the ostium of the right PDA. She was treated with complex bifurcation angioplasty and kissing stent placement to the OM1/mid left circumflex using 2 drug-eluting stents. Plan for staged PCI to the RCA next week. She was started on ASA and Brilinta. When she started the Brilinta she noted shortness of breath. This has persistent, but is slowly improving. The patient denies chest pain. Cath site has healed well. We will set patient up for staged PCI to the RCA. Continue Aspirin, Brilinta, coreg, Losartan, Crestor, and SL NTG.   Hypotension BP is 98/70 and she reports dizziness and lightheadedness daily. I will decrease Coreg to 3.12,5mg  BID and Losartan to 12.5mg  daily. We will reassess at follow-up  HFrEF/ICM The patient is euvolemic on exam. Echo showed LVEF 35-40%, mild LVH, mild MR. Decrease Coreg and Losartan as above. Low BP limiting GDMT. Plan to re-check a limited echo in about 2 months.   HLD She was intolerant to Lipitor and Zetia. She is tolerating low dose Crestor. Re-check lipids at follow-up.   Disposition: Follow up in 3 week(s) with MD/APP   Shared Decision Making/Informed Consent   Informed Consent   Shared Decision Making/Informed Consent The risks [stroke (1 in 1000), death (1 in 1000), kidney failure [usually temporary] (1 in 500), bleeding (1 in 200), allergic reaction [possibly serious] (1 in 200)], benefits (diagnostic support and management of coronary artery disease) and alternatives of a cardiac catheterization were discussed in detail with  Ms. Bergland and she is willing to proceed.      Signed, Chloris Marcoux David Stall, PA-C  11/30/2022 4:38 PM    [facility]

## 2022-12-01 ENCOUNTER — Other Ambulatory Visit: Payer: Self-pay

## 2022-12-01 DIAGNOSIS — Z79899 Other long term (current) drug therapy: Secondary | ICD-10-CM

## 2022-12-01 LAB — CBC
Hematocrit: 42.7 % (ref 34.0–46.6)
Hemoglobin: 14.1 g/dL (ref 11.1–15.9)
MCH: 29 pg (ref 26.6–33.0)
MCHC: 33 g/dL (ref 31.5–35.7)
MCV: 88 fL (ref 79–97)
Platelets: 396 10*3/uL (ref 150–450)
RBC: 4.87 x10E6/uL (ref 3.77–5.28)
RDW: 12.9 % (ref 11.7–15.4)
WBC: 11.3 10*3/uL — ABNORMAL HIGH (ref 3.4–10.8)

## 2022-12-01 LAB — BASIC METABOLIC PANEL
BUN/Creatinine Ratio: 16 (ref 12–28)
BUN: 18 mg/dL (ref 8–27)
CO2: 21 mmol/L (ref 20–29)
Calcium: 9.7 mg/dL (ref 8.7–10.3)
Chloride: 105 mmol/L (ref 96–106)
Creatinine, Ser: 1.1 mg/dL — ABNORMAL HIGH (ref 0.57–1.00)
Glucose: 140 mg/dL — ABNORMAL HIGH (ref 70–99)
Potassium: 5.5 mmol/L — ABNORMAL HIGH (ref 3.5–5.2)
Sodium: 141 mmol/L (ref 134–144)
eGFR: 51 mL/min/{1.73_m2} — ABNORMAL LOW (ref >59)

## 2022-12-02 DIAGNOSIS — Z09 Encounter for follow-up examination after completed treatment for conditions other than malignant neoplasm: Secondary | ICD-10-CM | POA: Insufficient documentation

## 2022-12-02 NOTE — Assessment & Plan Note (Signed)
Patient is stable post discharge and has no new issues or questions about discharge plans at the visit today for hospital follow up.  I have reviewed the records from the hospital admission in detail with patient today. 

## 2022-12-02 NOTE — Assessment & Plan Note (Addendum)
Continue statin, brilinta, beta blocker.  Repeat cath planned to address RCA stenosis

## 2022-12-02 NOTE — Assessment & Plan Note (Signed)
Historically untreated due to statin intolerance.  She had also declined  trial of Zetia and PCSK 9 inhibitor, but has been tolerating rosuvastatin disnce discharge form hospital for NSTEMI. Repeat LFTS needed

## 2022-12-11 ENCOUNTER — Encounter
Admission: RE | Disposition: A | Discharge status: Home / Self Care | Payer: Self-pay | Source: Home / Self Care | Attending: Cardiovascular Disease

## 2022-12-11 ENCOUNTER — Other Ambulatory Visit: Payer: Self-pay

## 2022-12-11 ENCOUNTER — Ambulatory Visit
Admission: RE | Admit: 2022-12-11 | Discharge: 2022-12-11 | Disposition: A | Discharge status: Home / Self Care | Payer: Medicare Other | Attending: Cardiovascular Disease | Admitting: Cardiovascular Disease

## 2022-12-11 DIAGNOSIS — G4733 Obstructive sleep apnea (adult) (pediatric): Secondary | ICD-10-CM | POA: Diagnosis not present

## 2022-12-11 DIAGNOSIS — I5020 Unspecified systolic (congestive) heart failure: Secondary | ICD-10-CM | POA: Insufficient documentation

## 2022-12-11 DIAGNOSIS — I252 Old myocardial infarction: Secondary | ICD-10-CM | POA: Diagnosis not present

## 2022-12-11 DIAGNOSIS — I5042 Chronic combined systolic (congestive) and diastolic (congestive) heart failure: Secondary | ICD-10-CM | POA: Insufficient documentation

## 2022-12-11 DIAGNOSIS — I2510 Atherosclerotic heart disease of native coronary artery without angina pectoris: Secondary | ICD-10-CM | POA: Diagnosis present

## 2022-12-11 DIAGNOSIS — E782 Mixed hyperlipidemia: Secondary | ICD-10-CM | POA: Diagnosis not present

## 2022-12-11 DIAGNOSIS — Z955 Presence of coronary angioplasty implant and graft: Secondary | ICD-10-CM | POA: Diagnosis not present

## 2022-12-11 DIAGNOSIS — J4520 Mild intermittent asthma, uncomplicated: Secondary | ICD-10-CM | POA: Insufficient documentation

## 2022-12-11 DIAGNOSIS — I2584 Coronary atherosclerosis due to calcified coronary lesion: Secondary | ICD-10-CM | POA: Diagnosis not present

## 2022-12-11 DIAGNOSIS — I25118 Atherosclerotic heart disease of native coronary artery with other forms of angina pectoris: Secondary | ICD-10-CM | POA: Diagnosis present

## 2022-12-11 DIAGNOSIS — F419 Anxiety disorder, unspecified: Secondary | ICD-10-CM | POA: Diagnosis not present

## 2022-12-11 DIAGNOSIS — L400 Psoriasis vulgaris: Secondary | ICD-10-CM | POA: Diagnosis not present

## 2022-12-11 DIAGNOSIS — Z79899 Other long term (current) drug therapy: Secondary | ICD-10-CM | POA: Insufficient documentation

## 2022-12-11 DIAGNOSIS — Z7982 Long term (current) use of aspirin: Secondary | ICD-10-CM | POA: Insufficient documentation

## 2022-12-11 DIAGNOSIS — E1140 Type 2 diabetes mellitus with diabetic neuropathy, unspecified: Secondary | ICD-10-CM | POA: Diagnosis not present

## 2022-12-11 DIAGNOSIS — Z87891 Personal history of nicotine dependence: Secondary | ICD-10-CM | POA: Diagnosis not present

## 2022-12-11 DIAGNOSIS — I110 Hypertensive heart disease with heart failure: Secondary | ICD-10-CM | POA: Insufficient documentation

## 2022-12-11 DIAGNOSIS — Z7951 Long term (current) use of inhaled steroids: Secondary | ICD-10-CM | POA: Diagnosis not present

## 2022-12-11 DIAGNOSIS — Z7902 Long term (current) use of antithrombotics/antiplatelets: Secondary | ICD-10-CM | POA: Diagnosis not present

## 2022-12-11 DIAGNOSIS — I255 Ischemic cardiomyopathy: Secondary | ICD-10-CM | POA: Insufficient documentation

## 2022-12-11 DIAGNOSIS — E1169 Type 2 diabetes mellitus with other specified complication: Secondary | ICD-10-CM | POA: Diagnosis present

## 2022-12-11 DIAGNOSIS — I209 Angina pectoris, unspecified: Secondary | ICD-10-CM | POA: Diagnosis present

## 2022-12-11 HISTORY — PX: LEFT HEART CATH AND CORONARY ANGIOGRAPHY: CATH118249

## 2022-12-11 HISTORY — PX: CORONARY STENT INTERVENTION: CATH118234

## 2022-12-11 LAB — POCT ACTIVATED CLOTTING TIME
Activated Clotting Time: 331 s
Activated Clotting Time: 274 s

## 2022-12-11 LAB — GLUCOSE, CAPILLARY: Glucose-Capillary: 125 mg/dL — ABNORMAL HIGH (ref 70–99)

## 2022-12-11 SURGERY — CORONARY STENT INTERVENTION
Anesthesia: Moderate Sedation | Laterality: Right

## 2022-12-11 MED ORDER — NITROGLYCERIN 1 MG/10 ML FOR IR/CATH LAB
INTRA_ARTERIAL | Status: AC
  Filled 2022-12-11: qty 10

## 2022-12-11 MED ORDER — FENTANYL CITRATE (PF) 100 MCG/2ML IJ SOLN
INTRAMUSCULAR | Status: AC
  Filled 2022-12-11: qty 2

## 2022-12-11 MED ORDER — CARVEDILOL 3.125 MG PO TABS: 3.1250 mg | ORAL_TABLET | Freq: Two times a day (BID) | ORAL | Status: DC

## 2022-12-11 MED ORDER — HEPARIN (PORCINE) IN NACL 2000-0.9 UNIT/L-% IV SOLN
INTRAVENOUS | Status: DC | PRN
  Administered 2022-12-11: 1000 mL

## 2022-12-11 MED ORDER — VERAPAMIL HCL 2.5 MG/ML IV SOLN
INTRAVENOUS | Status: AC
  Filled 2022-12-11: qty 2

## 2022-12-11 MED ORDER — ONDANSETRON HCL 4 MG/2ML IJ SOLN: 4.0000 mg | Freq: Four times a day (QID) | INTRAMUSCULAR | Status: DC | PRN

## 2022-12-11 MED ORDER — ALPRAZOLAM 0.5 MG PO TABS: 0.2500 mg | ORAL_TABLET | Freq: Two times a day (BID) | ORAL | Status: DC | PRN

## 2022-12-11 MED ORDER — ASPIRIN 81 MG PO TBEC: 81.0000 mg | DELAYED_RELEASE_TABLET | Freq: Every day | ORAL | Status: DC

## 2022-12-11 MED ORDER — SODIUM CHLORIDE 0.9 % IV SOLN: INTRAVENOUS | Status: DC

## 2022-12-11 MED ORDER — IOHEXOL 300 MG/ML  SOLN
INTRAMUSCULAR | Status: DC | PRN
  Administered 2022-12-11: 90 mL

## 2022-12-11 MED ORDER — LOSARTAN POTASSIUM 25 MG PO TABS: 12.5000 mg | ORAL_TABLET | Freq: Every day | ORAL | Status: DC

## 2022-12-11 MED ORDER — FENTANYL CITRATE (PF) 100 MCG/2ML IJ SOLN
INTRAMUSCULAR | Status: DC | PRN
  Administered 2022-12-11: 50 ug via INTRAVENOUS

## 2022-12-11 MED ORDER — ASPIRIN 81 MG PO CHEW: 81.0000 mg | CHEWABLE_TABLET | ORAL | Status: DC

## 2022-12-11 MED ORDER — HEPARIN (PORCINE) IN NACL 1000-0.9 UT/500ML-% IV SOLN
INTRAVENOUS | Status: AC
  Filled 2022-12-11: qty 1000

## 2022-12-11 MED ORDER — MIDAZOLAM HCL 2 MG/2ML IJ SOLN
INTRAMUSCULAR | Status: DC | PRN
  Administered 2022-12-11: 1 mg via INTRAVENOUS

## 2022-12-11 MED ORDER — SODIUM CHLORIDE 0.9 % WEIGHT BASED INFUSION
3.0000 mL/kg/h | INTRAVENOUS | Status: DC
  Administered 2022-12-11: 3 mL/kg/h via INTRAVENOUS

## 2022-12-11 MED ORDER — ALBUTEROL SULFATE HFA 108 (90 BASE) MCG/ACT IN AERS: 2.0000 | INHALATION_SPRAY | Freq: Four times a day (QID) | RESPIRATORY_TRACT | Status: DC | PRN

## 2022-12-11 MED ORDER — SODIUM CHLORIDE 0.9 % IV SOLN: 250.0000 mL | INTRAVENOUS | Status: DC | PRN

## 2022-12-11 MED ORDER — SODIUM CHLORIDE 0.9 % WEIGHT BASED INFUSION
1.0000 mL/kg/h | INTRAVENOUS | Status: DC
  Administered 2022-12-11: 1 mL/kg/h via INTRAVENOUS

## 2022-12-11 MED ORDER — LORATADINE 10 MG PO TABS: 10.0000 mg | ORAL_TABLET | Freq: Every day | ORAL | Status: DC

## 2022-12-11 MED ORDER — HEPARIN SODIUM (PORCINE) 1000 UNIT/ML IJ SOLN
INTRAMUSCULAR | Status: AC
  Filled 2022-12-11: qty 10

## 2022-12-11 MED ORDER — NITROGLYCERIN 1 MG/10 ML FOR IR/CATH LAB
INTRA_ARTERIAL | Status: DC | PRN
  Administered 2022-12-11 (×2): 200 ug via INTRACORONARY

## 2022-12-11 MED ORDER — SODIUM CHLORIDE 0.9% FLUSH: 3.0000 mL | Freq: Two times a day (BID) | INTRAVENOUS | Status: DC

## 2022-12-11 MED ORDER — HEPARIN SODIUM (PORCINE) 1000 UNIT/ML IJ SOLN
INTRAMUSCULAR | Status: DC | PRN
  Administered 2022-12-11: 9000 [IU] via INTRAVENOUS

## 2022-12-11 MED ORDER — TILDRAKIZUMAB-ASMN 100 MG/ML ~~LOC~~ SOSY: 100.0000 mg | PREFILLED_SYRINGE | SUBCUTANEOUS | Status: DC

## 2022-12-11 MED ORDER — HEPARIN (PORCINE) IN NACL 1000-0.9 UT/500ML-% IV SOLN
INTRAVENOUS | Status: DC | PRN
  Administered 2022-12-11 (×2): 500 mL

## 2022-12-11 MED ORDER — NITROGLYCERIN 0.4 MG SL SUBL: 0.4000 mg | SUBLINGUAL_TABLET | SUBLINGUAL | Status: DC | PRN

## 2022-12-11 MED ORDER — ACETAMINOPHEN 325 MG PO TABS: 650.0000 mg | ORAL_TABLET | ORAL | Status: DC | PRN

## 2022-12-11 MED ORDER — MIDAZOLAM HCL 2 MG/2ML IJ SOLN
INTRAMUSCULAR | Status: AC
  Filled 2022-12-11: qty 2

## 2022-12-11 MED ORDER — SODIUM CHLORIDE 0.9% FLUSH: 3.0000 mL | INTRAVENOUS | Status: DC | PRN

## 2022-12-11 MED ORDER — VERAPAMIL HCL 2.5 MG/ML IV SOLN
INTRAVENOUS | Status: DC | PRN
  Administered 2022-12-11: 2.5 mg via INTRAVENOUS

## 2022-12-11 MED ORDER — ONDANSETRON HCL 4 MG PO TABS: 4.0000 mg | ORAL_TABLET | Freq: Three times a day (TID) | ORAL | Status: DC | PRN

## 2022-12-11 MED ORDER — ROSUVASTATIN CALCIUM 5 MG PO TABS: 5.0000 mg | ORAL_TABLET | Freq: Every day | ORAL | Status: DC

## 2022-12-11 MED ORDER — TICAGRELOR 90 MG PO TABS: 90.0000 mg | ORAL_TABLET | Freq: Two times a day (BID) | ORAL | Status: DC

## 2022-12-11 SURGICAL SUPPLY — 19 items
BALLN MINITREK RX 2.0X15 (BALLOONS) ×2
BALLN ~~LOC~~ EUPHORA RX 2.75X15 (BALLOONS) ×2
BALLOON MINITREK RX 2.0X15 (BALLOONS) IMPLANT
BALLOON ~~LOC~~ EUPHORA RX 2.75X15 (BALLOONS) IMPLANT
CATH INFINITI 5 FR JL3.5 (CATHETERS) IMPLANT
CATH LAUNCHER 6FR JR4 (CATHETERS) IMPLANT
DEVICE RAD TR BAND REGULAR (VASCULAR PRODUCTS) IMPLANT
DRAPE BRACHIAL (DRAPES) IMPLANT
GLIDESHEATH SLEND SS 6F .021 (SHEATH) IMPLANT
GUIDEWIRE INQWIRE 1.5J.035X260 (WIRE) IMPLANT
INQWIRE 1.5J .035X260CM (WIRE) ×2
KIT ENCORE 26 ADVANTAGE (KITS) IMPLANT
PACK CARDIAC CATH (CUSTOM PROCEDURE TRAY) ×2 IMPLANT
PROTECTION STATION PRESSURIZED (MISCELLANEOUS) ×2
SET ATX-X65L (MISCELLANEOUS) IMPLANT
STATION PROTECTION PRESSURIZED (MISCELLANEOUS) IMPLANT
STENT ONYX FRONTIER 2.5X22 (Permanent Stent) IMPLANT
TUBING CIL FLEX 10 FLL-RA (TUBING) IMPLANT
WIRE RUNTHROUGH .014X180CM (WIRE) IMPLANT

## 2022-12-11 NOTE — Interval H&P Note (Signed)
History and Physical Interval Note:  12/11/2022 11:45 AM  [name]  has presented today for surgery, with the diagnosis of PCI Staged RCA     CAD   Chronic systolic HF.  The various methods of treatment have been discussed with the patient and family. After consideration of risks, benefits and other options for treatment, the patient has consented to  Procedure(s): CORONARY STENT INTERVENTION (Right) as a surgical intervention.  The patient's history has been reviewed, patient examined, no change in status, stable for surgery.  I have reviewed the patient's chart and labs.  Questions were answered to the patient's satisfaction.     Lorine Bears

## 2022-12-11 NOTE — Discharge Summary (Signed)
Discharge Summary for Same Day PCI   Patient ID: [name] MRN: [id number]; DOB: [birth year]  Admit date: 12/11/2022 Discharge date: 12/11/2022  Primary Care Provider: Sherlene Shams, MD  Primary Cardiologist: Lorine Bears, MD  Primary Electrophysiologist:  None   Discharge Diagnoses    Principal Problem:   Coronary artery disease involving native coronary artery of native heart Active Problems:   Hyperlipidemia associated with type 2 diabetes mellitus (HCC)   Angina pectoris (HCC)   HFrEF (heart failure with reduced ejection fraction) [facility])   Ischemic cardiomyopathy   Diagnostic Studies/Procedures    Cardiac Catheterization 12/11/2022:  Coronary stent intervention 12/11/22   Ost LM lesion is 30% stenosed.   Prox LAD to Mid LAD lesion is 30% stenosed.   Mid LAD lesion is 40% stenosed.   Mid LAD to Dist LAD lesion is 30% stenosed.   Prox RCA to Mid RCA lesion is 30% stenosed.   Dist RCA lesion is 70% stenosed with 90% stenosed side branch in RPDA.   1st Diag lesion is 50% stenosed.   Ost RCA to Prox RCA lesion is 50% stenosed.   RPAV lesion is 40% stenosed.   Non-stenotic Prox Cx to Mid Cx lesion was previously treated.   Non-stenotic 1st Mrg lesion was previously treated.   A drug-eluting stent was successfully placed using a STENT ONYX FRONTIER 2.5X22.   Post intervention, there is a 0% residual stenosis.   Post intervention, the side branch was reduced to 0% residual stenosis.   1.  Widely patent left circumflex stent with no significant restenosis.  Severe distal RCA stenosis extending into the right PDA.  Moderate ostial RCA stenosis with pressure dampening with guide catheter engagement.  However, there was no pressure dampening with diagnostic catheter engagement during most recent angiography. 2.  Left ventricular angiography was not performed.  Mildly elevated left ventricular end-diastolic pressure at 18 mmHg. 3.  Successful angioplasty and drug-eluting  stent placement to the distal right coronary artery extending into the right PDA.   Recommendations: Continue dual antiplatelet therapy for at least 6 months. Continue aggressive treatment of risk factors.   Coronary Diagrams  Diagnostic Dominance: Right  Intervention      _____________   History of Present Illness     [name] is a 77 y.o. 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   Patient was seen in the ER November 17, 2023 for chest pain and elevated troponin.  Heart cath showed diffuse and moderately calcified arteries with severe two-vessel CAD.  Culprit was likely plaque rupture from OM1 and mid left circumflex treated with successful bifurcation angioplasty with kissing stent placement to OM1/mid left circumflex using 2 drug-eluting stents.  Patient was started on DAPT for at least 12 months with aspirin and Brilinta.  Echo showed EF of 35 to 40%.  Plan was for staged PCI in a few weeks.  Patient was seen in the office 11/30/22 and was stable from a cardiac perspective with improving SOB. She was set up for staged PCI of the RCA.  Hospital Course     The patient underwent cardiac cath as noted above with staged PCI to the RCA. Plan for DAPT with ASA/Brilinta for at least 12 months. The patient was seen by cardiac rehab while in  short stay. There were no observed complications post cath. Radial right cath site was re-evaluated prior to discharge and found to be stable without any complications. Instructions/precautions regarding cath site care were given prior to discharge.  [name] was seen by Dr. Kirke Corin and determined stable for discharge home. Follow up with our office has been arranged. Medications are listed below. Pertinent changes  include none.    _____________  Cath/PCI Registry Performance & Quality Measures: Aspirin prescribed? - Yes ADP Receptor Inhibitor (Plavix/Clopidogrel, Brilinta/Ticagrelor or Effient/Prasugrel) prescribed (includes medically managed patients)? - Yes High Intensity Statin (Lipitor 40-80mg  or Crestor 20-40mg ) prescribed? - Yes For EF <40%, was ACEI/ARB prescribed? - Yes For EF <40%, Aldosterone Antagonist (Spironolactone or Eplerenone) prescribed? - No - Reason:  will address as OP Cardiac Rehab Phase II ordered (Included Medically managed Patients)? - Yes  _____________   Discharge Vitals Blood pressure 127/76, pulse 71, temperature 97.7 F (36.5 C), resp. rate 19, height 5\' 6"  (1.676 m), weight 93.5 kg, SpO2 98%.  Filed Weights   12/11/22 0840  Weight: 93.5 kg    Last Labs & Radiologic Studies    CBC No results for input(s): "WBC", "NEUTROABS", "HGB", "HCT", "MCV", "PLT" in the last 72 hours. Basic Metabolic Panel No results for input(s): "NA", "K", "CL", "CO2", "GLUCOSE", "BUN", "CREATININE", "CALCIUM", "MG", "PHOS" in the last 72 hours. Liver Function Tests No results for input(s): "AST", "ALT", "ALKPHOS", "BILITOT", "PROT", "ALBUMIN" in the last 72 hours. No results for input(s): "LIPASE", "AMYLASE" in the last 72 hours. High Sensitivity Troponin:   Recent Labs  Lab 11/17/22 1646 11/17/22 1812  TROPONINIHS 1,560* 1,483*    BNP Invalid input(s): "POCBNP" D-Dimer No results for input(s): "DDIMER" in the last 72 hours. Hemoglobin A1C No results for input(s): "HGBA1C" in the last 72 hours. Fasting Lipid Panel No results for input(s): "CHOL", "HDL", "LDLCALC", "TRIG", "CHOLHDL", "LDLDIRECT" in the last 72 hours. Thyroid Function Tests No results for input(s): "TSH", "T4TOTAL", "T3FREE", "THYROIDAB" in the last 72 hours.  Invalid input(s): "FREET3" _____________  CARDIAC CATHETERIZATION  Result Date: 12/11/2022   Ost LM lesion is 30% stenosed.   Prox LAD to Mid  LAD lesion is 30% stenosed.   Mid LAD lesion is 40% stenosed.   Mid LAD to Dist LAD lesion is 30% stenosed.   Prox RCA to Mid RCA lesion is 30% stenosed.   Dist RCA lesion is 70% stenosed with 90% stenosed side branch in RPDA.   1st Diag lesion is 50% stenosed.   Ost RCA to Prox RCA lesion is 50% stenosed.   RPAV lesion is 40% stenosed.   Non-stenotic Prox Cx to Mid Cx lesion was previously treated.   Non-stenotic 1st Mrg lesion was previously treated.   A drug-eluting stent was successfully placed using a STENT ONYX FRONTIER 2.5X22.   Post intervention, there is a 0% residual stenosis.   Post intervention, the side branch was reduced to 0% residual stenosis. 1.  Widely patent left circumflex stent with no significant restenosis.  Severe distal RCA stenosis extending into the right PDA.  Moderate ostial RCA stenosis with pressure dampening with guide catheter engagement.  However, there was no pressure dampening with diagnostic catheter engagement during most recent angiography. 2.  Left ventricular angiography was not performed.  Mildly elevated left ventricular end-diastolic pressure at 18 mmHg. 3.  Successful angioplasty and drug-eluting stent placement to the distal right coronary artery extending into the right PDA. Recommendations: Continue dual antiplatelet therapy for at least 6 months.  Continue aggressive treatment of risk factors.   CARDIAC CATHETERIZATION  Result Date: 11/20/2022   Prox RCA lesion is 30% stenosed.   Dist RCA lesion is 70% stenosed with 90% stenosed side branch in RPDA.   Prox LAD to Mid LAD lesion is 30% stenosed.   1st Diag lesion is 50% stenosed.   Mid LAD lesion is 40% stenosed.   Mid LAD to Dist LAD lesion is 30% stenosed.   Ost LM lesion is 30% stenosed.   1st Mrg lesion is 99% stenosed.   Prox Cx to Mid Cx lesion is 90% stenosed.   A drug-eluting stent was successfully placed using a STENT ONYX FRONTIER 2.5X26.   A drug-eluting stent was successfully placed using a STENT ONYX  FRONTIER 2.5X22.   Post intervention, there is a 0% residual stenosis.   Post intervention, there is a 0% residual stenosis. 1.  Diffuse and moderately calcified coronary arteries with severe two-vessel coronary artery disease.  The culprit for myocardial infarction seems to be plaque rupture and OM1 and mid left circumflex.  In addition, there is severe stenosis in the distal RCA extending into the ostium of the right PDA.  The LAD has moderate disease in multiple areas. 2.  Left ventricular angiography was not performed.  EF was moderately reduced by echo.  LVEDP was mildly elevated. 3.  Successful complex bifurcation angioplasty and kissing stent placement to OM1/mid left circumflex using 2 drug-eluting stents. Recommendations: Dual antiplatelet therapy for at least 12 months and preferably longer. The patient is intolerant to statins but will try small dose rosuvastatin 5 mg daily. I switch Toprol to carvedilol for better blood pressure control. Recommend staged RCA PCI in few weeks.   ECHOCARDIOGRAM COMPLETE  Result Date: 11/18/2022    ECHOCARDIOGRAM REPORT   Patient Name:   [name] Date of Exam: 11/18/2022 Medical Rec #:  [id number]     Height:       66.0 in Accession #:    [id number]    Weight:       215.1 lb Date of Birth:  09/05/1945      BSA:          2.063 m Patient Age:    77 years      BP:           156/92 mmHg Patient Gender: F             HR:           94 bpm. Exam Location:  [facility] Procedure: 2D Echo, Cardiac Doppler, Color Doppler and Intracardiac            Opacification Agent Indications:    NSTEMI  History:        Patient has no prior history of Echocardiogram examinations.                 PAD, Signs/Symptoms:Chest Pain; Risk Factors:Hypertension,                 Diabetes, Dyslipidemia, Former Smoker and Sleep Apnea.  Sonographer:    Dondra Prader RVT RCS Referring Phys: [id number] Andris Baumann  Sonographer Comments: Technically difficult study due to poor echo windows, suboptimal parasternal  window, suboptimal apical window, suboptimal subcostal window and patient is obese. Image acquisition challenging due to patient body habitus. IMPRESSIONS  1. Left ventricular ejection fraction, by estimation, is 35 to 40%. The left ventricle has moderately decreased function. The left ventricle demonstrates regional wall motion abnormalities (see scoring diagram/findings  for description). There is mild concentric left ventricular hypertrophy. Left ventricular diastolic parameters are indeterminate. There is mild hypokinesis of the left ventricular, entire inferoseptal wall and inferior wall. There is moderate hypokinesis of the left ventricular, entire  anterolateral wall, inferolateral wall and anterior wall.  2. Right ventricular systolic function is normal. The right ventricular size is not well visualized. There is normal pulmonary artery systolic pressure.  3. The mitral valve is normal in structure. Mild mitral valve regurgitation. No evidence of mitral stenosis.  4. The aortic valve was not well visualized. There is moderate calcification of the aortic valve. There is mild thickening of the aortic valve. Aortic valve regurgitation is not visualized. Mild aortic valve stenosis.  5. The inferior vena cava is normal in size with greater than 50% respiratory variability, suggesting right atrial pressure of 3 mmHg. Comparison(s): No prior Echocardiogram. Conclusion(s)/Recommendation(s): Very difficult to see wall motion, even with use of echo contrast. Moderately reduced LVEF, anterior wall not well visualized, in contrast images lateral wall appears slightly more hypokinetic than other walls, but images  are poor sensitivity for wall motion. FINDINGS  Left Ventricle: Left ventricular ejection fraction, by estimation, is 35 to 40%. The left ventricle has moderately decreased function. The left ventricle demonstrates regional wall motion abnormalities. Mild hypokinesis of the left ventricular, entire inferoseptal  wall and inferior wall. Moderate hypokinesis of the left ventricular, entire anterolateral wall, inferolateral wall and anterior wall. Definity contrast agent was given IV to delineate the left ventricular endocardial borders. The left ventricular internal cavity size was normal in size. There is mild concentric left ventricular hypertrophy. Left ventricular diastolic parameters are indeterminate. Right Ventricle: The right ventricular size is not well visualized. Right vetricular wall thickness was not well visualized. Right ventricular systolic function is normal. There is normal pulmonary artery systolic pressure. The tricuspid regurgitant velocity is 1.86 m/s, and with an assumed right atrial pressure of 3 mmHg, the estimated right ventricular systolic pressure is 16.8 mmHg. Left Atrium: Left atrial size was normal in size. Right Atrium: Right atrial size was not well visualized. Pericardium: There is no evidence of pericardial effusion. Mitral Valve: The mitral valve is normal in structure. Mild mitral valve regurgitation. No evidence of mitral valve stenosis. Tricuspid Valve: The tricuspid valve is normal in structure. Tricuspid valve regurgitation is trivial. No evidence of tricuspid stenosis. Aortic Valve: The aortic valve was not well visualized. There is moderate calcification of the aortic valve. There is mild thickening of the aortic valve. Aortic valve regurgitation is not visualized. Mild aortic stenosis is present. Aortic valve mean gradient measures 8.5 mmHg. Aortic valve peak gradient measures 15.1 mmHg. Aortic valve area, by VTI measures 1.07 cm. Pulmonic Valve: The pulmonic valve was not well visualized. Pulmonic valve regurgitation is not visualized. No evidence of pulmonic stenosis. Aorta: The aortic root, ascending aorta, aortic arch and descending aorta are all structurally normal, with no evidence of dilitation or obstruction. Venous: The inferior vena cava is normal in size with greater  than 50% respiratory variability, suggesting right atrial pressure of 3 mmHg. IAS/Shunts: The interatrial septum was not well visualized.  LEFT VENTRICLE PLAX 2D LVIDd:         4.75 cm   Diastology LVIDs:         3.55 cm   LV e' medial:    6.85 cm/s LV PW:         1.25 cm   LV E/e' medial:  16.1 LV IVS:  1.15 cm   LV e' lateral:   6.53 cm/s LVOT diam:     1.90 cm   LV E/e' lateral: 16.8 LV SV:         35 LV SV Index:   17 LVOT Area:     2.84 cm  RIGHT VENTRICLE             IVC RV S prime:     12.10 cm/s  IVC diam: 1.60 cm TAPSE (M-mode): 2.0 cm LEFT ATRIUM             Index LA diam:        2.80 cm 1.36 cm/m LA Vol (A2C):   43.1 ml 20.89 ml/m LA Vol (A4C):   55.4 ml 26.86 ml/m LA Biplane Vol: 50.6 ml 24.53 ml/m  AORTIC VALVE                     PULMONIC VALVE AV Area (Vmax):    1.24 cm      PV Vmax:       1.24 m/s AV Area (Vmean):   1.19 cm      PV Peak grad:  6.2 mmHg AV Area (VTI):     1.07 cm AV Vmax:           194.50 cm/s AV Vmean:          135.500 cm/s AV VTI:            0.324 m AV Peak Grad:      15.1 mmHg AV Mean Grad:      8.5 mmHg LVOT Vmax:         85.00 cm/s LVOT Vmean:        56.900 cm/s LVOT VTI:          0.123 m LVOT/AV VTI ratio: 0.38  AORTA Ao Root diam: 2.30 cm Ao Asc diam:  2.80 cm MITRAL VALVE                TRICUSPID VALVE MV Area (PHT): 6.27 cm     TR Peak grad:   13.8 mmHg MV Decel Time: 121 msec     TR Vmax:        186.00 cm/s MV E velocity: 110.00 cm/s MV A velocity: 103.00 cm/s  SHUNTS MV E/A ratio:  1.07         Systemic VTI:  0.12 m                             Systemic Diam: 1.90 cm Jodelle Red MD Electronically signed by Jodelle Red MD Signature Date/Time: 11/18/2022/12:16:38 PM    Final    CT Angio Chest PE W/Cm &/Or Wo Cm  Result Date: 11/17/2022 CLINICAL DATA:  Pulmonary embolus suspected with high probability. Elevated troponin levels. EXAM: CT ANGIOGRAPHY CHEST WITH CONTRAST TECHNIQUE: Multidetector CT imaging of the chest was performed using the  standard protocol during bolus administration of intravenous contrast. Multiplanar CT image reconstructions and MIPs were obtained to evaluate the vascular anatomy. RADIATION DOSE REDUCTION: This exam was performed according to the departmental dose-optimization program which includes automated exposure control, adjustment of the mA and/or kV according to patient size and/or use of iterative reconstruction technique. CONTRAST:  65mL OMNIPAQUE IOHEXOL 350 MG/ML SOLN COMPARISON:  Chest radiograph 11/17/2022 FINDINGS: Cardiovascular: Technically adequate study with good opacification of the central and segmental pulmonary arteries. Moderate motion artifact. No focal filling defects. No evidence of significant  pulmonary embolus. Mild cardiac enlargement. No pericardial effusions. Normal caliber thoracic aorta. Few scattered calcifications in the aorta. Prominent coronary artery calcification. Mediastinum/Nodes: No enlarged mediastinal, hilar, or axillary lymph nodes. Thyroid gland, trachea, and esophagus demonstrate no significant findings. Lungs/Pleura: Mild dependent atelectasis in the lung bases. No airspace disease or consolidation. No pleural effusions. No pneumothorax. Upper Abdomen: No acute abnormalities. Musculoskeletal: Degenerative changes in the spine. No acute bony abnormalities. Review of the MIP images confirms the above findings. IMPRESSION: 1. No evidence of significant pulmonary embolus. 2. Cardiac enlargement with prominent coronary artery calcifications. 3. Mild dependent atelectasis in the lungs. No evidence of active pulmonary disease. Electronically Signed   By: Burman Nieves M.D.   On: 11/17/2022 18:44   DG Chest 2 View  Result Date: 11/17/2022 CLINICAL DATA:  Elevated troponin EXAM: CHEST - 2 VIEW COMPARISON:  03/05/2018 FINDINGS: Mild cardiac enlargement. No vascular congestion, edema, or consolidation. Blunting of the costophrenic angles suggests small bilateral pleural effusions. No  pneumothorax. Mediastinal contours appear intact. Degenerative changes in the spine. IMPRESSION: 1. Cardiac enlargement. 2. Small bilateral pleural effusions. 3. Lungs are otherwise clear. Electronically Signed   By: Burman Nieves M.D.   On: 11/17/2022 18:41    Disposition   Pt is being discharged home today in good condition.  Follow-up Plans & Appointments     Discharge Instructions     AMB Referral to Cardiac Rehabilitation - Phase II   Complete by: As directed    Diagnosis: Coronary Stents   After initial evaluation and assessments completed: Virtual Based Care may be provided alone or in conjunction with Phase 2 Cardiac Rehab based on patient barriers.: Yes   Intensive Cardiac Rehabilitation (ICR) [facility] location only OR Traditional Cardiac Rehabilitation (TCR) *If criteria for ICR are not met will enroll in TCR [facility] only): Yes   Diet - low sodium heart healthy   Complete by: As directed    Increase activity slowly   Complete by: As directed         Discharge Medications   Allergies as of 12/11/2022       Reactions   Meloxicam Other (See Comments)   Hypertension, tachycardia   Statins Other (See Comments)   "Muscle weakness"   Sulfa Drugs Cross Reactors    Zostavax [zoster Vaccine Live]    Bad reaction        Medication List     TAKE these medications    albuterol 108 (90 Base) MCG/ACT inhaler Commonly known as: VENTOLIN HFA Inhale 2 puffs into the lungs every 6 (six) hours as needed for wheezing or shortness of breath.   ALPRAZolam 0.25 MG tablet Commonly known as: XANAX Take 1 tablet (0.25 mg total) by mouth 2 (two) times daily as needed for anxiety.   aspirin EC 81 MG tablet Take 1 tablet (81 mg total) by mouth daily. Swallow whole.   budesonide 180 MCG/ACT inhaler Commonly known as: PULMICORT Inhale 2 puffs into the lungs 2 (two) times daily.   carvedilol 3.125 MG tablet Commonly known as: COREG Take 1 tablet (3.125 mg total) by mouth 2 (two)  times daily with a meal.   cetirizine 10 MG tablet Commonly known as: ZYRTEC Take 10 mg by mouth daily.   EPINEPHrine 0.3 mg/0.3 mL Soaj injection Commonly known as: EPI-PEN as needed.   losartan 25 MG tablet Commonly known as: COZAAR Take 0.5 tablets (12.5 mg total) by mouth daily.   Lumigan 0.01 % Soln Generic drug: bimatoprost Place 1 drop  into both eyes at bedtime.   Lumigan 0.01 % Soln Generic drug: bimatoprost Place 1 drop into both eyes at bedtime.   MILK THISTLE PO Take 1 capsule by mouth daily.   nitroGLYCERIN 0.4 MG SL tablet Commonly known as: NITROSTAT Place 1 tablet (0.4 mg total) under the tongue every 5 (five) minutes for up to 3 doses as needed for chest pain.   ondansetron 4 MG tablet Commonly known as: ZOFRAN Take 1 tablet (4 mg total) by mouth every 8 (eight) hours as needed for nausea or vomiting.   PROBIOTIC COMPLEX ACIDOPHILUS PO Take 1 capsule by mouth daily.   rosuvastatin 5 MG tablet Commonly known as: CRESTOR Take 1 tablet (5 mg total) by mouth daily.   sodium chloride 0.65 % Soln nasal spray Commonly known as: OCEAN Place 1 spray into the nose as needed.   ticagrelor 90 MG Tabs tablet Commonly known as: BRILINTA Take 1 tablet (90 mg total) by mouth 2 (two) times daily.   tildrakizumab-asmn 100 MG/ML subcutaneous injection Commonly known as: ILUMYA Inject 100 mg into the skin every 3 (three) months.           Allergies Allergies  Allergen Reactions   Meloxicam Other (See Comments)    Hypertension, tachycardia   Statins Other (See Comments)    "Muscle weakness"   Sulfa Drugs Cross Reactors    Zostavax [Zoster Vaccine Live]     Bad reaction     Outstanding Labs/Studies   None  Duration of Discharge Encounter   Greater than 30 minutes including physician time.  Signed, Rocco Kerkhoff David Stall, PA-C 12/11/2022, 3:16 PM

## 2022-12-12 ENCOUNTER — Telehealth: Payer: Self-pay

## 2022-12-12 ENCOUNTER — Encounter: Payer: Self-pay | Admitting: Cardiovascular Disease

## 2022-12-12 DIAGNOSIS — G4733 Obstructive sleep apnea (adult) (pediatric): Secondary | ICD-10-CM

## 2022-12-12 NOTE — Telephone Encounter (Signed)
Sleep study results have been placed in yellow results folder.

## 2022-12-13 ENCOUNTER — Other Ambulatory Visit: Payer: Self-pay

## 2022-12-13 ENCOUNTER — Telehealth: Payer: Self-pay | Admitting: Cardiovascular Disease

## 2022-12-13 MED ORDER — EZETIMIBE 10 MG PO TABS
10.0000 mg | ORAL_TABLET | Freq: Every day | ORAL | 3 refills | Status: DC
  Filled 2022-12-13: qty 90, 90d supply, fill #0

## 2022-12-13 NOTE — Telephone Encounter (Signed)
Stop rosuvastatin and start Zetia 10 mg daily.

## 2022-12-13 NOTE — Telephone Encounter (Signed)
Patient has been made aware and Zetia 10 mg once daily has been sent in for her.

## 2022-12-13 NOTE — Telephone Encounter (Signed)
Called and spoke with patient. Patient states that she has been having left leg pain that started about a week ago. Patient says that the pain began about a week after starting Crestor. Patient reports that she had the same symptoms when she took Lipitor in the past. Patient would like to try a different medication. Will forward to MD for recommendations.

## 2022-12-13 NOTE — Telephone Encounter (Signed)
Pt c/o medication issue:  1. Name of Medication:   rosuvastatin (CRESTOR) 5 MG tablet   2. How are you currently taking this medication (dosage and times per day)? As prescribed  3. Are you having a reaction (difficulty breathing--STAT)?   Pain in left leg  4. What is your medication issue?   Patient stated she has been having a bad muscle pain in her left leg (patient stated an 8 out of 10). Patient wants to change this medication.  Patient stated she was previously on Lipitor and that did not work for her.

## 2022-12-13 NOTE — Telephone Encounter (Signed)
Pt is aware and gave a verbal understanding.  

## 2022-12-13 NOTE — Assessment & Plan Note (Signed)
REPEAT SLEEP STUDY  NOV 2024 REVIEWED .  SHE HAS MILD SLEEP APNEA , AHI OF 9 BOTH NIGHTS.  AUTOADJUSTING  CPAP HAS BEEN ORDERED

## 2022-12-15 ENCOUNTER — Other Ambulatory Visit: Payer: Self-pay

## 2022-12-19 ENCOUNTER — Encounter: Payer: Self-pay | Admitting: Oncology

## 2022-12-19 ENCOUNTER — Other Ambulatory Visit: Payer: Self-pay

## 2022-12-19 MED ORDER — LUMIGAN 0.01 % OP SOLN
1.0000 [drp] | Freq: Every day | OPHTHALMIC | 5 refills | Status: AC
  Filled 2022-12-19: qty 2.5, 50d supply, fill #0
  Filled 2023-02-21: qty 2.5, 50d supply, fill #1
  Filled 2023-04-09: qty 2.5, 50d supply, fill #2
  Filled 2023-05-28: qty 2.5, 50d supply, fill #3
  Filled 2023-07-17: qty 2.5, 50d supply, fill #4

## 2022-12-21 ENCOUNTER — Other Ambulatory Visit (INDEPENDENT_AMBULATORY_CARE_PROVIDER_SITE_OTHER): Payer: Medicare Other

## 2022-12-21 ENCOUNTER — Ambulatory Visit (INDEPENDENT_AMBULATORY_CARE_PROVIDER_SITE_OTHER): Payer: Medicare Other

## 2022-12-21 DIAGNOSIS — E785 Hyperlipidemia, unspecified: Secondary | ICD-10-CM

## 2022-12-21 DIAGNOSIS — E1169 Type 2 diabetes mellitus with other specified complication: Secondary | ICD-10-CM | POA: Diagnosis not present

## 2022-12-21 DIAGNOSIS — R0902 Hypoxemia: Secondary | ICD-10-CM

## 2022-12-21 LAB — COMPREHENSIVE METABOLIC PANEL
ALT: 17 U/L (ref 0–35)
AST: 19 U/L (ref 0–37)
Albumin: 4 g/dL (ref 3.5–5.2)
Alkaline Phosphatase: 65 U/L (ref 39–117)
BUN: 13 mg/dL (ref 6–23)
CO2: 30 meq/L (ref 19–32)
Calcium: 9.6 mg/dL (ref 8.4–10.5)
Chloride: 102 meq/L (ref 96–112)
Creatinine, Ser: 0.95 mg/dL (ref 0.40–1.20)
GFR: 57.49 mL/min — ABNORMAL LOW (ref >60.00)
Glucose, Bld: 109 mg/dL — ABNORMAL HIGH (ref 70–99)
Potassium: 4.8 meq/L (ref 3.5–5.1)
Sodium: 139 meq/L (ref 135–145)
Total Bilirubin: 1.3 mg/dL — ABNORMAL HIGH (ref 0.2–1.2)
Total Protein: 6.9 g/dL (ref 6.0–8.3)

## 2022-12-21 NOTE — Progress Notes (Signed)
Pt came in for a walk test only to confirm if pt needed the oxygen that [employer] had requested. Pt's resting O2 was 99% while ambulating pt's O2 dropped to 83%. Sat pt down and her O2 came up immediately to 99%.  Spoke with Dr. Darrick Huntsman and she advised that pt in fact does need the oxygen recommended by [employer] and a referral to cardiopulmonary rehab. I let pt know this and she stated that she believes Dr. Kirke Corin has already ordered the cardiopulmonary rehab but pt declined to start until after the holidays. Pt stated that they are going to call her back in January to get her set up for her 36 sessions.

## 2022-12-26 ENCOUNTER — Encounter: Payer: Self-pay | Admitting: Medical

## 2022-12-26 ENCOUNTER — Ambulatory Visit: Payer: Medicare Other | Attending: Medical | Admitting: Medical

## 2022-12-26 VITALS — BP 108/61 | HR 60 | Ht 66.0 in | Wt 201.0 lb

## 2022-12-26 DIAGNOSIS — I255 Ischemic cardiomyopathy: Secondary | ICD-10-CM

## 2022-12-26 DIAGNOSIS — I2510 Atherosclerotic heart disease of native coronary artery without angina pectoris: Secondary | ICD-10-CM | POA: Diagnosis not present

## 2022-12-26 DIAGNOSIS — E782 Mixed hyperlipidemia: Secondary | ICD-10-CM | POA: Diagnosis not present

## 2022-12-26 DIAGNOSIS — Z79899 Other long term (current) drug therapy: Secondary | ICD-10-CM

## 2022-12-26 DIAGNOSIS — I5020 Unspecified systolic (congestive) heart failure: Secondary | ICD-10-CM

## 2022-12-26 NOTE — Patient Instructions (Signed)
Medication Instructions:  Your physician recommends that you continue on your current medications as directed. Please refer to the Current Medication list given to you today.   *If you need a refill on your cardiac medications before your next appointment, please call your pharmacy*   Lab Work: Your provider would like for you to have following labs drawn today (CBC, CK).     Testing/Procedures: Your physician has requested that you have an limited echocardiogram in 1 month. Echocardiography is a painless test that uses sound waves to create images of your heart. It provides your doctor with information about the size and shape of your heart and how well your heart's chambers and valves are working.   You may receive an ultrasound enhancing agent through an IV if needed to better visualize your heart during the echo. This procedure takes approximately one hour.  There are no restrictions for this procedure.  This will take place at [address] [facility] [address], [place] [address]  Please note: We ask at that you not bring children with you during ultrasound (echo/ vascular) testing. Due to room size and safety concerns, children are not allowed in the ultrasound rooms during exams. Our front office staff cannot provide observation of children in our lobby area while testing is being conducted. An adult accompanying a patient to their appointment will only be allowed in the ultrasound room at the discretion of the ultrasound technician under special circumstances. We apologize for any inconvenience.    Follow-Up: At [facility], you and your health needs are our priority.  As part of our continuing mission to provide you with exceptional heart care, we have created designated Provider Care Teams.  These Care Teams include your primary Cardiologist (physician) and Advanced Practice Providers (APPs -  Physician Assistants and Nurse Practitioners) who all work together  to provide you with the care you need, when you need it.  We recommend signing up for the patient portal called "MyChart".  Sign up information is provided on this After Visit Summary.  MyChart is used to connect with patients for Virtual Visits (Telemedicine).  Patients are able to view lab/test results, encounter notes, upcoming appointments, etc.  Non-urgent messages can be sent to your provider as well.   To learn more about what you can do with MyChart, go to [email or web address].    Your next appointment:   1 month(s)  Provider:   Terrilee Croak, PA-C

## 2022-12-26 NOTE — Progress Notes (Signed)
Cardiology Office Note:    Date:  12/26/2022   ID:  [name], [name] December 29, 1945, MRN [id number]  PCP:  Sherlene Shams, MD  [facility] Cardiologist:  Lorine Bears, MD  [facility] Electrophysiologist:  None   Referring MD: Sherlene Shams, MD   Chief Complaint: Hospital follow-up  History of Present Illness:    [name] is a 77 y.o. 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   Patient was sent to the ER 11/17/2023 for chest pain and elevated troponin.  High-sensitivity troponin elevated to 1569.  EKG showed ST depression in the lateral leads.  Heart cath showed diffuse and moderately calcified arteries with severe two-vessel CAD.  Culprit was likely plaque rupture from OM1 and mid left circumflex treated with successful bifurcation angioplasty and kissing stent placement to OM1/mid left circumflex using 2 drug-eluting stents.  Patient was started on DAPT for least 12 months with aspirin and Brilinta.  Echo showed EF 35 to 40%, mild hypokinesis of the inferoseptal inferior wall and moderate HK of the entire anterolateral, inferolateral and anterior walls with normal RV SF and RVSP.  The patient underwent staged PCI of the RCA on 12/11/2022 and discharged on aspirin and Brilinta for least 12 months. Cath showed widely patent left circumflex and mildly elevated LVEDP.   Today, the patient reports breathing is a little better. She was not doing well with statins. She is not taking zetia. She reports muscle ache from her hip down to her knee, only on the left side. She has lidocaine cream and tylenol for the pain. She is using O2 for low O2 when she exerts herself.  She denies chest pain or lower leg edema. Cath site is  stable.   Past Medical History:  Diagnosis Date   Asthma    Asthma due to environmental allergies    grass, mold, trees, dust   Cataract    Cellulitis    LEFT LEG   Diabetes mellitus    CONTROLLED ON DIET ALONE   Edema leg    LEFT LEG...CHRONIC   Endometrial hyperplasia    External hemorrhoid    Fatty liver    Glaucoma    Hepatic cyst    STABLE PER 05/20/2008 ULTRASOUND   Hypertension    borderline...controlled since taking herself of HCTZ IN JULY   IBS (irritable bowel syndrome)    Murmur, cardiac    Neuropathy    OSA on CPAP    Osteoarthritis    Psoriasis    Sleep apnea    CPAP    Past Surgical History:  Procedure Laterality Date   BONE DENSITY TEST  01/17/2004   CATARACT EXTRACTION, BILATERAL Bilateral    May 2023 and June 2023   CORONARY STENT INTERVENTION N/A 11/20/2022   Procedure: CORONARY STENT INTERVENTION;  Surgeon: Iran Ouch, MD;  Location: [facility] INVASIVE CV LAB;  Service: Cardiovascular;  Laterality: N/A;   CORONARY STENT INTERVENTION Right 12/11/2022   Procedure: CORONARY STENT INTERVENTION;  Surgeon: Iran Ouch, MD;  Location: [facility] INVASIVE CV LAB;  Service: Cardiovascular;  Laterality: Right;   HYSTEROSCOPY WITH D & C  01/16/2005   BC OF ABNORAL PAP AND ENDOMETRIAL HYPERPLASIA   LEFT HEART CATH AND CORONARY ANGIOGRAPHY N/A  11/20/2022   Procedure: LEFT HEART CATH AND CORONARY ANGIOGRAPHY;  Surgeon: Iran Ouch, MD;  Location: [facility] INVASIVE CV LAB;  Service: Cardiovascular;  Laterality: N/A;   LEFT HEART CATH AND CORONARY ANGIOGRAPHY N/A 12/11/2022   Procedure: LEFT HEART CATH AND CORONARY ANGIOGRAPHY;  Surgeon: Iran Ouch, MD;  Location: [facility] INVASIVE CV LAB;  Service: Cardiovascular;  Laterality: N/A;   ONE VAGINAL DELIVERY     UMBILICAL HERNIA REPAIR  01/17/2008   VAGINAL HYSTERECTOMY  01/17/2008    Current Medications: Current Meds  Medication Sig   albuterol (VENTOLIN HFA) 108 (90 Base) MCG/ACT inhaler Inhale 2 puffs  into the lungs every 6 (six) hours as needed for wheezing or shortness of breath.   ALPRAZolam (XANAX) 0.25 MG tablet Take 1 tablet (0.25 mg total) by mouth 2 (two) times daily as needed for anxiety.   aspirin EC 81 MG tablet Take 1 tablet (81 mg total) by mouth daily. Swallow whole.   bimatoprost (LUMIGAN) 0.01 % SOLN Place 1 drop into both eyes at bedtime.   budesonide (PULMICORT) 180 MCG/ACT inhaler Inhale 2 puffs into the lungs 2 (two) times daily.   carvedilol (COREG) 3.125 MG tablet Take 1 tablet (3.125 mg total) by mouth 2 (two) times daily with a meal.   cetirizine (ZYRTEC) 10 MG tablet Take 10 mg by mouth daily.   EPINEPHrine 0.3 mg/0.3 mL IJ SOAJ injection as needed.   ezetimibe (ZETIA) 10 MG tablet Take 1 tablet (10 mg total) by mouth daily.   losartan (COZAAR) 25 MG tablet Take 0.5 tablets (12.5 mg total) by mouth daily.   MILK THISTLE PO Take 1 capsule by mouth daily.    nitroGLYCERIN (NITROSTAT) 0.4 MG SL tablet Place 1 tablet (0.4 mg total) under the tongue every 5 (five) minutes for up to 3 doses as needed for chest pain.   ondansetron (ZOFRAN) 4 MG tablet Take 1 tablet (4 mg total) by mouth every 8 (eight) hours as needed for nausea or vomiting.   Probiotic Product (PROBIOTIC COMPLEX ACIDOPHILUS PO) Take 1 capsule by mouth daily.   sodium chloride (OCEAN) 0.65 % SOLN nasal spray Place 1 spray into the nose as needed.   ticagrelor (BRILINTA) 90 MG TABS tablet Take 1 tablet (90 mg total) by mouth 2 (two) times daily.   tildrakizumab-asmn (ILUMYA) 100 MG/ML subcutaneous injection Inject 100 mg into the skin every 3 (three) months.     Allergies:   Meloxicam, Statins, Sulfa drugs cross reactors, and Zostavax [zoster vaccine live]   Social History   Socioeconomic History   Marital status: Married    Spouse name: Not on file   Number of children: 2   Years of education: Not on file   Highest education level: Some college, no degree  Occupational History   Occupation: RETIRED   Tobacco Use   Smoking status: Former    Current packs/day: 0.00    Types: Cigarettes    Quit date: 01/16/1978    Years since quitting: 44.9   Smokeless tobacco: Never   Tobacco comments:    REMOTELY QUIT IN 1987 AFTER A FEW YEARS OF USE  Vaping Use   Vaping status: Never Used  Substance and Sexual Activity   Alcohol use: No   Drug use: No   Sexual activity: Not on file  Other Topics Concern   Not on file  Social History Narrative   Lives with husband.   Social Determinants of Health   Financial Resource Strain: Low Risk  (  07/30/2018)   Overall Financial Resource Strain (CARDIA)    Difficulty of Paying Living Expenses: Not hard at all  Food Insecurity: No Food Insecurity (11/22/2022)   Hunger Vital Sign    Worried About Running Out of Food in the Last Year: Never true    Ran Out of Food in the Last Year: Never true  Transportation Needs: No Transportation Needs (11/22/2022)   PRAPARE - Administrator, Civil Service (Medical): No    Lack of Transportation (Non-Medical): No  Recent Concern: Transportation Needs - Unmet Transportation Needs (11/21/2022)   PRAPARE - Transportation    Lack of Transportation (Medical): Yes    Lack of Transportation (Non-Medical): Yes  Physical Activity: Unknown (07/30/2018)   Exercise Vital Sign    Days of Exercise per Week: 0 days    Minutes of Exercise per Session: Not on file  Stress: No Stress Concern Present (07/30/2018)   [employer] of Occupational Health - Occupational Stress Questionnaire    Feeling of Stress : Not at all  Social Connections: Not on file     Family History: The patient's family history includes Heart disease (age of onset: 38) in her father; Mental illness in her mother. There is no history of Cancer or Breast cancer.  ROS:   Please see the history of present illness.     All other systems reviewed and are negative.  EKGs/Labs/Other Studies Reviewed:    The following studies were reviewed  today:  LHC staged PCI 12/11/22   Ost LM lesion is 30% stenosed.   Prox LAD to Mid LAD lesion is 30% stenosed.   Mid LAD lesion is 40% stenosed.   Mid LAD to Dist LAD lesion is 30% stenosed.   Prox RCA to Mid RCA lesion is 30% stenosed.   Dist RCA lesion is 70% stenosed with 90% stenosed side branch in RPDA.   1st Diag lesion is 50% stenosed.   Ost RCA to Prox RCA lesion is 50% stenosed.   RPAV lesion is 40% stenosed.   Non-stenotic Prox Cx to Mid Cx lesion was previously treated.   Non-stenotic 1st Mrg lesion was previously treated.   A drug-eluting stent was successfully placed using a STENT ONYX FRONTIER 2.5X22.   Post intervention, there is a 0% residual stenosis.   Post intervention, the side branch was reduced to 0% residual stenosis.   1.  Widely patent left circumflex stent with no significant restenosis.  Severe distal RCA stenosis extending into the right PDA.  Moderate ostial RCA stenosis with pressure dampening with guide catheter engagement.  However, there was no pressure dampening with diagnostic catheter engagement during most recent angiography. 2.  Left ventricular angiography was not performed.  Mildly elevated left ventricular end-diastolic pressure at 18 mmHg. 3.  Successful angioplasty and drug-eluting stent placement to the distal right coronary artery extending into the right PDA.   Recommendations: Continue dual antiplatelet therapy for at least 6 months. Continue aggressive treatment of risk factors.  LHC 11/20/23   Prox RCA lesion is 30% stenosed.   Dist RCA lesion is 70% stenosed with 90% stenosed side branch in RPDA.   Prox LAD to Mid LAD lesion is 30% stenosed.   1st Diag lesion is 50% stenosed.   Mid LAD lesion is 40% stenosed.   Mid LAD to Dist LAD lesion is 30% stenosed.   Ost LM lesion is 30% stenosed.   1st Mrg lesion is 99% stenosed.   Prox Cx to Mid Cx lesion  is 90% stenosed.   A drug-eluting stent was successfully placed using a STENT ONYX  FRONTIER 2.5X26.   A drug-eluting stent was successfully placed using a STENT ONYX FRONTIER 2.5X22.   Post intervention, there is a 0% residual stenosis.   Post intervention, there is a 0% residual stenosis.   1.  Diffuse and moderately calcified coronary arteries with severe two-vessel coronary artery disease.  The culprit for myocardial infarction seems to be plaque rupture and OM1 and mid left circumflex.  In addition, there is severe stenosis in the distal RCA extending into the ostium of the right PDA.  The LAD has moderate disease in multiple areas. 2.  Left ventricular angiography was not performed.  EF was moderately reduced by echo.  LVEDP was mildly elevated. 3.  Successful complex bifurcation angioplasty and kissing stent placement to OM1/mid left circumflex using 2 drug-eluting stents.   Recommendations: Dual antiplatelet therapy for at least 12 months and preferably longer. The patient is intolerant to statins but will try small dose rosuvastatin 5 mg daily. I switch Toprol to carvedilol for better blood pressure control. Recommend staged RCA PCI in few weeks.    Echo 11/18/22 1. Left ventricular ejection fraction, by estimation, is 35 to 40%. The  left ventricle has moderately decreased function. The left ventricle  demonstrates regional wall motion abnormalities (see scoring  diagram/findings for description). There is mild  concentric left ventricular hypertrophy. Left ventricular diastolic  parameters are indeterminate. There is mild hypokinesis of the left  ventricular, entire inferoseptal wall and inferior wall. There is moderate  hypokinesis of the left ventricular, entire   anterolateral wall, inferolateral wall and anterior wall.   2. Right ventricular systolic function is normal. The right ventricular  size is not well visualized. There is normal pulmonary artery systolic  pressure.   3. The mitral valve is normal in structure. Mild mitral valve  regurgitation. No  evidence of mitral stenosis.   4. The aortic valve was not well visualized. There is moderate  calcification of the aortic valve. There is mild thickening of the aortic  valve. Aortic valve regurgitation is not visualized. Mild aortic valve  stenosis.   5. The inferior vena cava is normal in size with greater than 50%  respiratory variability, suggesting right atrial pressure of 3 mmHg.   Comparison(s): No prior Echocardiogram.   EKG:  EKG is ordered today.  The ekg ordered today demonstrates NSR 60bpm, sinus arrythmia, non specific t wave changes  Recent Labs: 11/13/2022: TSH 3.43 11/30/2022: Hemoglobin 14.1; Platelets 396 12/21/2022: ALT 17; BUN 13; Creatinine, Ser 0.95; Potassium 4.8; Sodium 139  Recent Lipid Panel    Component Value Date/Time   CHOL 189 11/13/2022 0845   TRIG 150.0 (H) 11/13/2022 0845   HDL 51.40 11/13/2022 0845   CHOLHDL 4 11/13/2022 0845   VLDL 30.0 11/13/2022 0845   LDLCALC 108 (H) 11/13/2022 0845   LDLDIRECT 127.0 11/13/2022 0845     Physical Exam:    VS:  BP 108/61 (BP Location: Left Arm, Patient Position: Sitting, Cuff Size: Normal)   Pulse 60   Ht 5\' 6"  (1.676 m)   Wt 201 lb (91.2 kg)   SpO2 99%   BMI 32.44 kg/m     Wt Readings from Last 3 Encounters:  12/26/22 201 lb (91.2 kg)  12/11/22 206 lb 2 oz (93.5 kg)  11/30/22 206 lb 2 oz (93.5 kg)     GEN:  Well nourished, well developed in no acute distress HEENT: Normal  NECK: No JVD; No carotid bruits LYMPHATICS: No lymphadenopathy CARDIAC: RRR, no murmurs, rubs, gallops RESPIRATORY:  Clear to auscultation without rales, wheezing or rhonchi  ABDOMEN: Soft, non-tender, non-distended MUSCULOSKELETAL:  No edema; No deformity  SKIN: Warm and dry NEUROLOGIC:  Alert and oriented x 3 PSYCHIATRIC:  Normal affect   ASSESSMENT:    1. Coronary artery disease, unspecified vessel or lesion type, unspecified whether angina present, unspecified whether native or transplanted heart   2. Ischemic  cardiomyopathy   3. HFrEF (heart failure with reduced ejection fraction) (HCC)   4. Hyperlipidemia, mixed   5. Medication management    PLAN:    In order of problems listed above:  CAD NSTEMI She had admission for NSTEMI found to have diffuse calcified coronary arteries with severe 2V CAD. Culprit for MI was OM1/Lcx. There was severe stenosis in the distal RCA extending into the ostium of the right PDA. She was treated with complex bifurcation angioplasty and kissing stent placement to the OM1/mid left Cx using 2 drug-eluting stents. She was started on ASA and brilinta with plan for staged PCI to the RCA. She underwent staged PCI to the RCA 11/25 and is overall doing well. Breathing is still short, but improving. Cath site is stable. She has O2 to use as needed at home. Continue DAPT with ASA/Brilinta for at least 6 months. Continue Coreg, Losartan, SL NTG and Zetia. She has been referred to cardiac rehab, but will start this in the new year.   HFrEF ICM Echo showed LVEF 35-40%. Continue Coreg 3.125mg BID and Losartan 12.5mg  daily. Low BP/orthostasis limiting GDMT.  Limited echo in 1 month.  HLD She is intolerant to statins. She has left muslce leg pain after taking Crestor. I will check a CK level. Continue Zetia 10mg  daily. She is not interested in injectables at this time.   Disposition: Follow up in 2 month(s) with MD/APP   Signed, Amarria Andreasen David Stall, PA-C  12/26/2022 12:36 PM    [facility]

## 2022-12-27 LAB — CBC
Hematocrit: 43.8 % (ref 34.0–46.6)
Hemoglobin: 14.3 g/dL (ref 11.1–15.9)
MCH: 29.9 pg (ref 26.6–33.0)
MCHC: 32.6 g/dL (ref 31.5–35.7)
MCV: 91 fL (ref 79–97)
Platelets: 323 10*3/uL (ref 150–450)
RBC: 4.79 x10E6/uL (ref 3.77–5.28)
RDW: 13.3 % (ref 11.7–15.4)
WBC: 8.5 10*3/uL (ref 3.4–10.8)

## 2022-12-27 LAB — CK: Total CK: 53 U/L (ref 32–182)

## 2022-12-27 NOTE — Progress Notes (Signed)
Advanced Heart Failure Clinic Note    PCP: Sherlene Shams, MD (last seen 11/24) Cardiologist: Terrilee Croak, NP (last seen 12/24)  HPI:  Ms Critcher is a 77 y/o female with a history of NSTEMI (11/24), plaque psoriasis, OSA on CPAP, DM, asthma, obesity, HTN, anxiety and chronic heart failure. Had staged PCI 12/11/22.  Admitted 11/17/22 due to 3-day history of recurrent substernal chest pain radiating to jaw, both shoulders and left arm, improved with deep breathing. Troponin peaked at 1568. ST depression in lateral leads. CTA chest no PE, mild cardiac enlargement with prominent coronary artery calcifications. No STEMI. Echo EF 35-40%, (+)RWMA. Cath w/ complex PCI. Meds adjusted and troponin trended down to 1483. Hx intolerance to atorvastatin w/ myalgias, will trial rosuvastatin - consider PCSK9i outpatient.   Echo 11/18/22: EF 35-40% with mild LVH, mild MR/ AS  LHC 11/20/22:    Prox RCA lesion is 30% stenosed.   Dist RCA lesion is 70% stenosed with 90% stenosed side branch in RPDA.   Prox LAD to Mid LAD lesion is 30% stenosed.   1st Diag lesion is 50% stenosed.   Mid LAD lesion is 40% stenosed.   Mid LAD to Dist LAD lesion is 30% stenosed.   Ost LM lesion is 30% stenosed.   1st Mrg lesion is 99% stenosed.   Prox Cx to Mid Cx lesion is 90% stenosed.   A drug-eluting stent was successfully placed using a STENT ONYX FRONTIER 2.5X26.   A drug-eluting stent was successfully placed using a STENT ONYX FRONTIER 2.5X22.   Post intervention, there is a 0% residual stenosis.   Post intervention, there is a 0% residual stenosis.  1.  Diffuse and moderately calcified coronary arteries with severe two-vessel coronary artery disease.  The culprit for myocardial infarction seems to be plaque rupture and OM1 and mid left circumflex.  In addition, there is severe stenosis in the distal RCA extending into the ostium of the right PDA.  The LAD has moderate disease in multiple areas. 2.  Left ventricular  angiography was not performed.  EF was moderately reduced by echo.  LVEDP was mildly elevated. 3.  Successful complex bifurcation angioplasty and kissing stent placement to OM1/mid left circumflex using 2 drug-eluting stents.  Recommendations: Dual antiplatelet therapy for at least 12 months and preferably longer. The patient is intolerant to statins but will try small dose rosuvastatin 5 mg daily.  Staged PCI 12/11/22: 1.  Widely patent left circumflex stent with no significant restenosis. Severe distal RCA stenosis extending into the right PDA. Moderate ostial RCA stenosis with pressure dampening with guide catheter engagement. However, there was no pressure dampening with diagnostic catheter engagement during most recent angiography. 2. Left ventricular angiography was not performed. Mildly elevated left ventricular end-diastolic pressure at 18 mmHg.3. Successful angioplasty and drug-eluting stent placement to the distal right coronary artery extending into the right PDA.   She presents today for a HF follow-up visit with a chief complaint of minimal SOB with moderate exertion. She does notice that her SOB is worse after every dose of brilinta but resolves a few hours afterwards. She has occasional dizziness and fatigue along with this. Denies chest pain, cough, palpitations, abdominal distention, pedal edema or weight gain. Her biggest complaint today is of her left thigh pain which she feels came from trying another statin. She is now on zetia but continues to have leg pain. Is using aspercreme with lidocaine on her left thigh from her hip down to her  knee. Sometimes she has to get up and move around because the pain is occasionally interfering with her sleep.   She is not adding salt to her food but is asking if she can eat out on rare occasions. She misses eating spaghetti or tacos.   ROS: All systems negative except as listed in HPI, PMH and Problem List.  SH:  Social History    Socioeconomic History   Marital status: Married    Spouse name: Not on file   Number of children: 2   Years of education: Not on file   Highest education level: Some college, no degree  Occupational History   Occupation: RETIRED  Tobacco Use   Smoking status: Former    Current packs/day: 0.00    Types: Cigarettes    Quit date: 01/16/1978    Years since quitting: 44.9   Smokeless tobacco: Never   Tobacco comments:    REMOTELY QUIT IN 1987 AFTER A FEW YEARS OF USE  Vaping Use   Vaping status: Never Used  Substance and Sexual Activity   Alcohol use: No   Drug use: No   Sexual activity: Not on file  Other Topics Concern   Not on file  Social History Narrative   Lives with husband.   Social Determinants of Health   Financial Resource Strain: Low Risk  (07/30/2018)   Overall Financial Resource Strain (CARDIA)    Difficulty of Paying Living Expenses: Not hard at all  Food Insecurity: No Food Insecurity (11/22/2022)   Hunger Vital Sign    Worried About Running Out of Food in the Last Year: Never true    Ran Out of Food in the Last Year: Never true  Transportation Needs: No Transportation Needs (11/22/2022)   PRAPARE - Administrator, Civil Service (Medical): No    Lack of Transportation (Non-Medical): No  Recent Concern: Transportation Needs - Unmet Transportation Needs (11/21/2022)   PRAPARE - Transportation    Lack of Transportation (Medical): Yes    Lack of Transportation (Non-Medical): Yes  Physical Activity: Unknown (07/30/2018)   Exercise Vital Sign    Days of Exercise per Week: 0 days    Minutes of Exercise per Session: Not on file  Stress: No Stress Concern Present (07/30/2018)   [employer] of Occupational Health - Occupational Stress Questionnaire    Feeling of Stress : Not at all  Social Connections: Not on file  Intimate Partner Violence: Not At Risk (11/18/2022)   Humiliation, Afraid, Rape, and Kick questionnaire    Fear of Current or  Ex-Partner: No    Emotionally Abused: No    Physically Abused: No    Sexually Abused: No    FH:  Family History  Problem Relation Age of Onset   Mental illness Mother        DEMENTIA   Heart disease Father 74       MI.Marland KitchenMarland KitchenS/D AVR/CABG x3   Cancer Neg Hx        no ovarian colon breast   Breast cancer Neg Hx     Past Medical History:  Diagnosis Date   Asthma    Asthma due to environmental allergies    grass, mold, trees, dust   Cataract    Cellulitis    LEFT LEG   Diabetes mellitus    CONTROLLED ON DIET ALONE   Edema leg    LEFT LEG...CHRONIC   Endometrial hyperplasia    External hemorrhoid    Fatty liver    Glaucoma  Hepatic cyst    STABLE PER 05/20/2008 ULTRASOUND   Hypertension    borderline...controlled since taking herself of HCTZ IN JULY   IBS (irritable bowel syndrome)    Murmur, cardiac    Neuropathy    OSA on CPAP    Osteoarthritis    Psoriasis    Sleep apnea    CPAP    Current Outpatient Medications  Medication Sig Dispense Refill   albuterol (VENTOLIN HFA) 108 (90 Base) MCG/ACT inhaler Inhale 2 puffs into the lungs every 6 (six) hours as needed for wheezing or shortness of breath.     ALPRAZolam (XANAX) 0.25 MG tablet Take 1 tablet (0.25 mg total) by mouth 2 (two) times daily as needed for anxiety. 30 tablet 0   aspirin EC 81 MG tablet Take 1 tablet (81 mg total) by mouth daily. Swallow whole. 30 tablet 12   bimatoprost (LUMIGAN) 0.01 % SOLN Place 1 drop into both eyes at bedtime. 2.5 mL 5   budesonide (PULMICORT) 180 MCG/ACT inhaler Inhale 2 puffs into the lungs 2 (two) times daily.     carvedilol (COREG) 3.125 MG tablet Take 1 tablet (3.125 mg total) by mouth 2 (two) times daily with a meal. 180 tablet 3   cetirizine (ZYRTEC) 10 MG tablet Take 10 mg by mouth daily.     EPINEPHrine 0.3 mg/0.3 mL IJ SOAJ injection as needed.     ezetimibe (ZETIA) 10 MG tablet Take 1 tablet (10 mg total) by mouth daily. 90 tablet 3   losartan (COZAAR) 25 MG tablet  Take 0.5 tablets (12.5 mg total) by mouth daily. 45 tablet 3   MILK THISTLE PO Take 1 capsule by mouth daily.      nitroGLYCERIN (NITROSTAT) 0.4 MG SL tablet Place 1 tablet (0.4 mg total) under the tongue every 5 (five) minutes for up to 3 doses as needed for chest pain. 25 tablet 0   ondansetron (ZOFRAN) 4 MG tablet Take 1 tablet (4 mg total) by mouth every 8 (eight) hours as needed for nausea or vomiting. 30 tablet 02   Probiotic Product (PROBIOTIC COMPLEX ACIDOPHILUS PO) Take 1 capsule by mouth daily.     sodium chloride (OCEAN) 0.65 % SOLN nasal spray Place 1 spray into the nose as needed.     ticagrelor (BRILINTA) 90 MG TABS tablet Take 1 tablet (90 mg total) by mouth 2 (two) times daily. 180 tablet 3   tildrakizumab-asmn (ILUMYA) 100 MG/ML subcutaneous injection Inject 100 mg into the skin every 3 (three) months.     No current facility-administered medications for this visit.   Vitals:   12/29/22 0951  BP: 114/64  Pulse: 71  SpO2: 98%  Weight: 202 lb (91.6 kg)   Wt Readings from Last 3 Encounters:  12/29/22 202 lb (91.6 kg)  12/26/22 201 lb (91.2 kg)  12/11/22 206 lb 2 oz (93.5 kg)   Lab Results  Component Value Date   CREATININE 0.95 12/21/2022   CREATININE 1.10 (H) 11/30/2022   CREATININE 0.74 11/21/2022    PHYSICAL EXAM:  General:  Well appearing. No resp difficulty HEENT: normal Neck: supple. JVP flat. No lymphadenopathy or thryomegaly appreciated. Cor: PMI normal. Regular rate & rhythm. No rubs, gallops or murmurs. Lungs: clear Abdomen: soft, nontender, nondistended. No hepatosplenomegaly. No bruits or masses.  Extremities: no cyanosis, clubbing, rash, edema Neuro: alert & oriented x3, cranial nerves grossly intact. Moves all 4 extremities w/o difficulty. Affect pleasant.   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- - 2 stents placed 11/20/22 with RCA PCI 12/11/22 - NYHA class II - euvolemic - weighing  daily and home weight has declined; reviewed to call for an overnight weight gain of > 2 pounds or a weekly weight gain of > 5 pounds - weight down 5 pounds from last visit here 1 month ago - Echo 11/18/22: EF 35-40% with mild LVH, mild MR/ AS - continue carvedilol 3.125mg  BID - continue losartan 12.5mg  daily - current BP will not allow for titration or other GDMT therapy at this time - not adding salt to her food and she and her husband are reading food labels for sodium content - can occasionally have chicken tacos or spaghetti but being extra cautious about sodium intake surrounding that meal knowing she could get her daily sodium intake at an outside meal - proNP 07/27/07 was 38.5  2: HTN- - BP 114/64 - saw PCP Darrick Huntsman) 11/24 - BMP 12/21/22 showed sodium 139, potassium 4.8, creatinine 0.95 & GFR 57.49  3: CAD- - continue zetia 10mg  daily - unable to tolerate crestor due to leg pain - continue brilinta 90mg  BID; does get worsening SOB after taking brilinta which resolves a few hours later - saw cardiology Fransico Michael) 12/24 - had staged PCI 12/11/22 - LDL 11/13/22 was 108 - Lipo (a) 11/18/22 was 145.8 - cardiac rehab to start early 2025 - LHC 11/20/22:    Prox RCA lesion is 30% stenosed.   Dist RCA lesion is 70% stenosed with 90% stenosed side branch in RPDA.   Prox LAD to Mid LAD lesion is 30% stenosed.   1st Diag lesion is 50% stenosed.   Mid LAD lesion is 40% stenosed.   Mid LAD to Dist LAD lesion is 30% stenosed.   Ost LM lesion is 30% stenosed.   1st Mrg lesion is 99% stenosed.   Prox Cx to Mid Cx lesion is 90% stenosed.   A drug-eluting stent was successfully placed using a STENT ONYX FRONTIER 2.5X26.   A drug-eluting stent was successfully placed using a STENT ONYX FRONTIER 2.5X22.   Post intervention, there is a 0% residual stenosis.   Post intervention, there is a 0% residual stenosis.  1.  Diffuse and moderately calcified coronary arteries with severe two-vessel coronary  artery disease.  The culprit for myocardial infarction seems to be plaque rupture and OM1 and mid left circumflex.  In addition, there is severe stenosis in the distal RCA extending into the ostium of the right PDA.  The LAD has moderate disease in multiple areas. 2.  Left ventricular angiography was not performed.  EF was moderately reduced by echo.  LVEDP was mildly elevated. 3.  Successful complex bifurcation angioplasty and kissing stent placement to OM1/mid left circumflex using 2 drug-eluting stents.  Recommendations: Dual antiplatelet therapy for at least 12 months and preferably longer.  4: OSA- - wearing CPAP but is trying to get the equipment replaced  5: DM- - home glucose around low 100's (109 at recent check) - A1c 11/13/22 was 7.1%   Return in 3 months, sooner if needed. If still doing well at that time and unable to titrate GDMT due to BP, will have her follow-up with cardiology only at that time.

## 2022-12-29 ENCOUNTER — Encounter: Payer: Self-pay | Admitting: Family

## 2022-12-29 ENCOUNTER — Ambulatory Visit: Payer: Medicare Other | Attending: Family | Admitting: Family

## 2022-12-29 VITALS — BP 114/64 | HR 71 | Wt 202.0 lb

## 2022-12-29 DIAGNOSIS — I255 Ischemic cardiomyopathy: Secondary | ICD-10-CM | POA: Diagnosis not present

## 2022-12-29 DIAGNOSIS — E119 Type 2 diabetes mellitus without complications: Secondary | ICD-10-CM | POA: Diagnosis not present

## 2022-12-29 DIAGNOSIS — I2510 Atherosclerotic heart disease of native coronary artery without angina pectoris: Secondary | ICD-10-CM | POA: Insufficient documentation

## 2022-12-29 DIAGNOSIS — I110 Hypertensive heart disease with heart failure: Secondary | ICD-10-CM | POA: Diagnosis not present

## 2022-12-29 DIAGNOSIS — G4733 Obstructive sleep apnea (adult) (pediatric): Secondary | ICD-10-CM | POA: Diagnosis not present

## 2022-12-29 DIAGNOSIS — I5022 Chronic systolic (congestive) heart failure: Secondary | ICD-10-CM | POA: Diagnosis not present

## 2022-12-29 DIAGNOSIS — Z955 Presence of coronary angioplasty implant and graft: Secondary | ICD-10-CM | POA: Diagnosis not present

## 2022-12-29 DIAGNOSIS — I252 Old myocardial infarction: Secondary | ICD-10-CM | POA: Diagnosis present

## 2022-12-29 DIAGNOSIS — L400 Psoriasis vulgaris: Secondary | ICD-10-CM | POA: Insufficient documentation

## 2022-12-29 DIAGNOSIS — F419 Anxiety disorder, unspecified: Secondary | ICD-10-CM | POA: Diagnosis not present

## 2022-12-29 DIAGNOSIS — J45909 Unspecified asthma, uncomplicated: Secondary | ICD-10-CM | POA: Diagnosis not present

## 2022-12-29 DIAGNOSIS — E669 Obesity, unspecified: Secondary | ICD-10-CM | POA: Insufficient documentation

## 2022-12-29 DIAGNOSIS — I10 Essential (primary) hypertension: Secondary | ICD-10-CM | POA: Diagnosis not present

## 2023-01-01 ENCOUNTER — Telehealth: Payer: Self-pay | Admitting: Cardiovascular Disease

## 2023-01-01 NOTE — Telephone Encounter (Signed)
Pt c/o medication issue:  1. Name of Medication:   ezetimibe (ZETIA) 10 MG tablet   2. How are you currently taking this medication (dosage and times per day)? As written  3. Are you having a reaction (difficulty breathing--STAT)? no  4. What is your medication issue? Pt wants to stop Zetia. Her leg pain is no better. Please advise

## 2023-01-01 NOTE — Telephone Encounter (Signed)
Okay to discontinue ezetimibe for now but she should strongly consider treatment with a PCSK9 inhibitor.  If she is interested, we can start Repatha 140 mg every 2 weeks.

## 2023-01-02 NOTE — Telephone Encounter (Signed)
The patient has been made aware. She has a follow up on 1/17 and will discuss Repatha at that time.

## 2023-01-08 ENCOUNTER — Telehealth: Payer: Self-pay | Admitting: Cardiovascular Disease

## 2023-01-08 DIAGNOSIS — M79605 Pain in left leg: Secondary | ICD-10-CM

## 2023-01-08 NOTE — Telephone Encounter (Signed)
Returned the call to the patient. She stated that she is still having left leg pain after stopping the rosuvastatin 21 days ago. She stated that pain first began when she started the rosuvastatin and the pain did not go away once she discontinued the medication. The pain has progressively gotten worse and now radiates from her left groin area to her knee. She has also had muscle spasms which her husband can see and feel. She denies discoloration or temperature change and stated that the pain is constant.   Tylenol and Lidocaine patches has not helped with the pain. She started a heating pad which has helped some.   She currently takes Brilinta 90 mg bid and 81 mg aspirin. She wants to know if there is anything else she can take for pain.   She has been advised she could go to an urgent care but would like to wait and see what Dr. Kirke Corin suggests.

## 2023-01-08 NOTE — Telephone Encounter (Signed)
Her CPK was normal and thus muscle injury is unlikely. Lets check an ABI to make sure there is no circulation issues on the left side. I do recommend that she sees her primary care physician as there might be some other issues going on.

## 2023-01-08 NOTE — Telephone Encounter (Signed)
Patient has been made aware. She will call her PCP for follow up. ABI order has been placed and message sent to scheduling.

## 2023-01-08 NOTE — Telephone Encounter (Signed)
Pt c/o medication issue:  1. Name of Medication: CRESTOR   2. How are you currently taking this medication (dosage and times per day)?   3. Are you having a reaction (difficulty breathing--STAT)?   4. What is your medication issue? Patient states she stopped taking this medication on November the 27th, however she is still having left leg pain to her hip and lower back area.  Having pain to sit, walk or stand.  She states Tylenol is not helping the pain.

## 2023-01-09 ENCOUNTER — Encounter: Payer: Self-pay | Admitting: Oncology

## 2023-01-11 ENCOUNTER — Ambulatory Visit: Payer: Medicare Other

## 2023-01-11 ENCOUNTER — Ambulatory Visit: Payer: Self-pay | Admitting: Internal Medicine

## 2023-01-11 ENCOUNTER — Ambulatory Visit
Admission: RE | Admit: 2023-01-11 | Discharge: 2023-01-11 | Disposition: A | Discharge status: Home / Self Care | Payer: Medicare Other | Source: Ambulatory Visit | Attending: Nurse Practitioner | Admitting: Nurse Practitioner

## 2023-01-11 ENCOUNTER — Ambulatory Visit: Payer: Medicare Other | Attending: Cardiovascular Disease

## 2023-01-11 ENCOUNTER — Ambulatory Visit (INDEPENDENT_AMBULATORY_CARE_PROVIDER_SITE_OTHER): Payer: Medicare Other | Admitting: Nurse Practitioner

## 2023-01-11 ENCOUNTER — Encounter: Payer: Self-pay | Admitting: Nurse Practitioner

## 2023-01-11 ENCOUNTER — Ambulatory Visit
Admission: RE | Admit: 2023-01-11 | Discharge: 2023-01-11 | Disposition: A | Discharge status: Home / Self Care | Payer: Medicare Other | Attending: Nurse Practitioner | Admitting: Nurse Practitioner

## 2023-01-11 VITALS — BP 134/84 | HR 80 | Temp 97.4°F | Ht 66.0 in | Wt 196.8 lb

## 2023-01-11 DIAGNOSIS — M79605 Pain in left leg: Secondary | ICD-10-CM | POA: Diagnosis present

## 2023-01-11 NOTE — Progress Notes (Signed)
Established Patient Office Visit  Subjective:  Patient ID: [name], female    DOB: 1945-02-23  Age: 77 y.o. MRN: [id number]  CC:  Chief Complaint  Patient presents with   Acute Visit    Leg pain    HPI  [name] presents  with complaint of progressively worsening lower back, left leg and hip pain from almost a month.  Patient arrived at the office in the wheelchair accompanied by her husband.  Patient reports that her left leg pain began approximately a week after starting Crestor about 12/13/2022. She was switched to Zetia however the leg pain did not improve and Zetia was discontinued on 01/01/2023 at patient request.  States that her pain has been progressively increased after discontinuing the medication. She denies any injury, numbness, weakness or swelling.  Her recent Creatinine kinase level within normal limit.  Reports that she recently underwent ABI testing at the cardiology office which was normal. She states that the pain makes it very difficult for her to move around and perform her daily activities.  Using lidocaine patches and Tylenol arthritis for pain management with minimal improvement.  Past Medical History:  Diagnosis Date   Asthma    Asthma due to environmental allergies    grass, mold, trees, dust   Cataract    Cellulitis    LEFT LEG   Diabetes mellitus    CONTROLLED ON DIET ALONE   Edema leg    LEFT LEG...CHRONIC   Endometrial hyperplasia    External hemorrhoid    Fatty liver    Glaucoma    Hepatic cyst    STABLE PER 05/20/2008 ULTRASOUND   Hypertension    borderline...controlled since taking herself of HCTZ IN JULY   IBS (irritable bowel syndrome)    Murmur, cardiac    Neuropathy    OSA on CPAP    Osteoarthritis    Psoriasis    Sleep apnea    CPAP    Past Surgical History:  Procedure Laterality Date   BONE DENSITY TEST  01/17/2004   CATARACT EXTRACTION, BILATERAL Bilateral    May 2023 and June 2023   CORONARY STENT INTERVENTION  N/A 11/20/2022   Procedure: CORONARY STENT INTERVENTION;  Surgeon: Iran Ouch, MD;  Location: [facility];  Service: Cardiovascular;  Laterality: N/A;   CORONARY STENT INTERVENTION Right 12/11/2022   Procedure: CORONARY STENT INTERVENTION;  Surgeon: Iran Ouch, MD;  Location: [facility];  Service: Cardiovascular;  Laterality: Right;   HYSTEROSCOPY WITH D & C  01/16/2005   BC OF ABNORAL PAP AND ENDOMETRIAL HYPERPLASIA   LEFT HEART CATH AND CORONARY ANGIOGRAPHY N/A 11/20/2022   Procedure: LEFT HEART CATH AND CORONARY ANGIOGRAPHY;  Surgeon: Iran Ouch, MD;  Location: [facility];  Service: Cardiovascular;  Laterality: N/A;   LEFT HEART CATH AND CORONARY ANGIOGRAPHY N/A 12/11/2022   Procedure: LEFT HEART CATH AND CORONARY ANGIOGRAPHY;  Surgeon: Iran Ouch, MD;  Location: [facility];  Service: Cardiovascular;  Laterality: N/A;   ONE VAGINAL DELIVERY     UMBILICAL HERNIA REPAIR  01/17/2008   VAGINAL HYSTERECTOMY  01/17/2008    Family History  Problem Relation Age of Onset   Mental illness Mother        DEMENTIA   Heart disease Father 54       MI.Marland KitchenMarland KitchenS/D AVR/CABG x3   Cancer Neg Hx        no ovarian colon breast   Breast  cancer Neg Hx     Social History   Socioeconomic History   Marital status: Married    Spouse name: Not on file   Number of children: 2   Years of education: Not on file   Highest education level: Some college, no degree  Occupational History   Occupation: RETIRED  Tobacco Use   Smoking status: Former    Current packs/day: 0.00    Types: Cigarettes    Quit date: 01/16/1978    Years since quitting: 45.0   Smokeless tobacco: Never   Tobacco comments:    REMOTELY QUIT IN 1987 AFTER A FEW YEARS OF USE  Vaping Use   Vaping status: Never Used  Substance and Sexual Activity   Alcohol use: No   Drug use: No   Sexual activity: Not on file  Other Topics Concern   Not on file  Social History Narrative    Lives with husband.   Social Drivers of Corporate investment banker Strain: Low Risk  (07/30/2018)   Overall Financial Resource Strain (CARDIA)    Difficulty of Paying Living Expenses: Not hard at all  Food Insecurity: No Food Insecurity (11/22/2022)   Hunger Vital Sign    Worried About Running Out of Food in the Last Year: Never true    Ran Out of Food in the Last Year: Never true  Transportation Needs: No Transportation Needs (11/22/2022)   PRAPARE - Administrator, Civil Service (Medical): No    Lack of Transportation (Non-Medical): No  Recent Concern: Transportation Needs - Unmet Transportation Needs (11/21/2022)   PRAPARE - Transportation    Lack of Transportation (Medical): Yes    Lack of Transportation (Non-Medical): Yes  Physical Activity: Unknown (07/30/2018)   Exercise Vital Sign    Days of Exercise per Week: 0 days    Minutes of Exercise per Session: Not on file  Stress: No Stress Concern Present (07/30/2018)   [employer] of Occupational Health - Occupational Stress Questionnaire    Feeling of Stress : Not at all  Social Connections: Not on file  Intimate Partner Violence: Not At Risk (11/18/2022)   Humiliation, Afraid, Rape, and Kick questionnaire    Fear of Current or Ex-Partner: No    Emotionally Abused: No    Physically Abused: No    Sexually Abused: No     Outpatient Medications Prior to Visit  Medication Sig Dispense Refill   albuterol (VENTOLIN HFA) 108 (90 Base) MCG/ACT inhaler Inhale 2 puffs into the lungs every 6 (six) hours as needed for wheezing or shortness of breath.     ALPRAZolam (XANAX) 0.25 MG tablet Take 1 tablet (0.25 mg total) by mouth 2 (two) times daily as needed for anxiety. 30 tablet 0   aspirin EC 81 MG tablet Take 1 tablet (81 mg total) by mouth daily. Swallow whole. 30 tablet 12   bimatoprost (LUMIGAN) 0.01 % SOLN Place 1 drop into both eyes at bedtime. 2.5 mL 5   budesonide (PULMICORT) 180 MCG/ACT inhaler Inhale 2 puffs into  the lungs 2 (two) times daily.     carvedilol (COREG) 3.125 MG tablet Take 1 tablet (3.125 mg total) by mouth 2 (two) times daily with a meal. 180 tablet 3   cetirizine (ZYRTEC) 10 MG tablet Take 10 mg by mouth daily.     EPINEPHrine 0.3 mg/0.3 mL IJ SOAJ injection as needed.     losartan (COZAAR) 25 MG tablet Take 0.5 tablets (12.5 mg total) by mouth daily. 45  tablet 3   MILK THISTLE PO Take 1 capsule by mouth daily.      nitroGLYCERIN (NITROSTAT) 0.4 MG SL tablet Place 1 tablet (0.4 mg total) under the tongue every 5 (five) minutes for up to 3 doses as needed for chest pain. 25 tablet 0   ondansetron (ZOFRAN) 4 MG tablet Take 1 tablet (4 mg total) by mouth every 8 (eight) hours as needed for nausea or vomiting. 30 tablet 02   Probiotic Product (PROBIOTIC COMPLEX ACIDOPHILUS PO) Take 1 capsule by mouth daily.     sodium chloride (OCEAN) 0.65 % SOLN nasal spray Place 1 spray into the nose as needed.     ticagrelor (BRILINTA) 90 MG TABS tablet Take 1 tablet (90 mg total) by mouth 2 (two) times daily. 180 tablet 3   tildrakizumab-asmn (ILUMYA) 100 MG/ML subcutaneous injection Inject 100 mg into the skin every 3 (three) months.     No facility-administered medications prior to visit.    Allergies  Allergen Reactions   Meloxicam Other (See Comments)    Hypertension, tachycardia   Statins Other (See Comments)    "Muscle weakness"   Sulfa Drugs Cross Reactors    Zostavax [Zoster Vaccine Live]     Bad reaction     ROS Review of Systems Negative unless indicated in HPI.    Objective:    Physical Exam Constitutional:      Appearance: Normal appearance.  Cardiovascular:     Rate and Rhythm: Normal rate and regular rhythm.     Pulses: Normal pulses.     Heart sounds: Normal heart sounds.  Musculoskeletal:        General: Tenderness present.     Cervical back: Normal range of motion.     Comments: Straight left leg raise positive  Neurological:     General: No focal deficit  present.     Mental Status: She is alert. Mental status is at baseline.  Psychiatric:        Mood and Affect: Mood normal.        Behavior: Behavior normal.        Thought Content: Thought content normal.        Judgment: Judgment normal.     BP 134/84   Pulse 80   Temp (!) 97.4 F (36.3 C)   Ht 5\' 6"  (1.676 m)   Wt 196 lb 12.8 oz (89.3 kg)   SpO2 97%   BMI 31.76 kg/m  Wt Readings from Last 3 Encounters:  01/11/23 196 lb 12.8 oz (89.3 kg)  12/29/22 202 lb (91.6 kg)  12/26/22 201 lb (91.2 kg)     Health Maintenance  Topic Date Due   Zoster Vaccines- Shingrix (1 of 2) 09/20/1964   OPHTHALMOLOGY EXAM  01/25/2022   COVID-19 Vaccine (5 - 2024-25 season) 09/17/2022   FOOT EXAM  09/22/2022   MAMMOGRAM  09/27/2022   Medicare Annual Wellness (AWV)  08/16/2023 (Originally 07/30/2019)   HEMOGLOBIN A1C  05/14/2023   Diabetic kidney evaluation - Urine ACR  06/15/2023   Diabetic kidney evaluation - eGFR measurement  12/21/2023   DTaP/Tdap/Td (3 - Td or Tdap) 09/22/2031   Pneumonia Vaccine 36+ Years old  Completed   INFLUENZA VACCINE  Completed   DEXA SCAN  Completed   Hepatitis C Screening  Completed   HPV VACCINES  Aged Out   Fecal DNA (Cologuard)  Discontinued    There are no preventive care reminders to display for this patient.  Lab Results  Component Value  Date   TSH 3.43 11/13/2022   Lab Results  Component Value Date   WBC 8.5 12/26/2022   HGB 14.3 12/26/2022   HCT 43.8 12/26/2022   MCV 91 12/26/2022   PLT 323 12/26/2022   Lab Results  Component Value Date   NA 139 12/21/2022   K 4.8 12/21/2022   CO2 30 12/21/2022   GLUCOSE 109 (H) 12/21/2022   BUN 13 12/21/2022   CREATININE 0.95 12/21/2022   BILITOT 1.3 (H) 12/21/2022   ALKPHOS 65 12/21/2022   AST 19 12/21/2022   ALT 17 12/21/2022   PROT 6.9 12/21/2022   ALBUMIN 4.0 12/21/2022   CALCIUM 9.6 12/21/2022   ANIONGAP 11 11/21/2022   EGFR 51 (L) 11/30/2022   GFR 57.49 (L) 12/21/2022   Lab Results   Component Value Date   CHOL 189 11/13/2022   Lab Results  Component Value Date   HDL 51.40 11/13/2022   Lab Results  Component Value Date   LDLCALC 108 (H) 11/13/2022   Lab Results  Component Value Date   TRIG 150.0 (H) 11/13/2022   Lab Results  Component Value Date   CHOLHDL 4 11/13/2022   Lab Results  Component Value Date   HGBA1C 7.1 (H) 11/13/2022      Assessment & Plan:  Left leg pain Assessment & Plan: Straight left leg raise positive. Given patient symptoms will perform imaging of the back and hip. If no improvement would refer her to Ortho for further evaluation and management. Advised patient to alternate hot and cold compress. Take Tylenol arthritis as needed for pain management.  Orders: -     DG HIP UNILAT W OR W/O PELVIS 2-3 VIEWS LEFT; Future -     DG Lumbar Spine 2-3 Views; Future -     traMADol HCl; Take 1 tablet (50 mg total) by mouth every 12 (twelve) hours as needed for up to 7 days for severe pain (pain score 7-10).  Dispense: 14 tablet; Refill: 0 -     Ambulatory referral to Orthopedics    Follow-up: Return if symptoms worsen or fail to improve.   Kara Dies, NP

## 2023-01-11 NOTE — Patient Instructions (Addendum)
Please go to the [facility] outpatient imaging. [facility] at [facility] [address] [place], [place] [address] [phone]

## 2023-01-11 NOTE — Telephone Encounter (Signed)
  Chief Complaint: leg pain Symptoms: left leg, hip, and lower back pain Frequency: since November Pertinent Negatives: Patient denies fever, swelling, numbness Disposition: [] ED /[] Urgent Care (no appt availability in office) / [x] Appointment(In office/virtual)/ []  [facility]/ [] Home Care/ [] Refused Recommended Disposition /[] [facility]/ []  Follow-up with PCP Additional Notes: Patient reports she has been experiencing left leg pain, hip pain, and lower back pain since November.  Patient denies numbness, weakness, and swelling. Patient requesting appointment to help manage severe pain and to get referral for MRI. Patient scheduled 12/26 per protocol. Patient verbalized understanding.     Copied from CRM [id number]. Topic: Clinical - Red Word Triage >> Jan 11, 2023  8:12 AM Irine Seal wrote: Red Word that prompted transfer to Nurse Triage: Extreme pain in left leg, hip and lower back Reason for Disposition  [1] SEVERE pain (e.g., excruciating, unable to do any normal activities) AND [2] not improved after 2 hours of pain medicine  Answer Assessment - Initial Assessment Questions 1. ONSET: "When did the pain start?"      Middle of november 2. LOCATION: "Where is the pain located?"      Left leg, hip and lower back 3. PAIN: "How bad is the pain?"    (Scale 1-10; or mild, moderate, severe)   -  MILD (1-3): doesn't interfere with normal activities    -  MODERATE (4-7): interferes with normal activities (e.g., work or school) or awakens from sleep, limping    -  SEVERE (8-10): excruciating pain, unable to do any normal activities, unable to walk     severe 4. WORK OR EXERCISE: "Has there been any recent work or exercise that involved this part of the body?"      no 5. CAUSE: "What do you think is causing the leg pain?"     I thought it was a new medication but changing medications did not change anything 6. OTHER SYMPTOMS: "Do you have any other symptoms?" (e.g., chest  pain, back pain, breathing difficulty, swelling, rash, fever, numbness, weakness)     Back pain  Protocols used: Leg Pain-A-AH

## 2023-01-11 NOTE — Telephone Encounter (Signed)
Appt scheduled for today.

## 2023-01-12 ENCOUNTER — Telehealth: Payer: Self-pay

## 2023-01-12 ENCOUNTER — Other Ambulatory Visit: Payer: Self-pay

## 2023-01-12 ENCOUNTER — Telehealth: Payer: Self-pay | Admitting: Cardiovascular Disease

## 2023-01-12 ENCOUNTER — Telehealth: Payer: Self-pay | Admitting: Nurse Practitioner

## 2023-01-12 MED ORDER — TRAMADOL HCL 50 MG PO TABS
50.0000 mg | ORAL_TABLET | Freq: Two times a day (BID) | ORAL | 0 refills | Status: AC | PRN
  Filled 2023-01-12: qty 14, 7d supply, fill #0

## 2023-01-12 NOTE — Telephone Encounter (Signed)
Patient has been made aware. She will contact PCP.

## 2023-01-12 NOTE — Telephone Encounter (Signed)
Copied from CRM [id number]. Topic: Clinical - Medication Question >> Jan 12, 2023  1:52 PM Truddie Crumble wrote: Reason for CRM: Pt called stating she saw NP Evelene Croon on yesterday and the pt stated her heart doctor (Dr. Kirke Corin) and the nurse stated that the pt can take a lose dose of oxycodone or tramadol. Pt stated that Dr. Kirke Corin nurse stated if NP Evelene Croon can look on mychart for the message about the medication. Pt stated her pharmacy is [facility]

## 2023-01-12 NOTE — Telephone Encounter (Signed)
Pt called in asking if there is something stronger than tylenol that she can take for her leg pain. Please advise.

## 2023-01-12 NOTE — Telephone Encounter (Signed)
NSAIDs are not recommended given that she is on dual antiplatelet therapy. She can check with her primary care physician about short-term tramadol or oxycodone.

## 2023-01-12 NOTE — Telephone Encounter (Signed)
Called and informed pt that medication has been sent to the pharmacy.

## 2023-01-12 NOTE — Telephone Encounter (Signed)
Called radiology and they stated that it could take 10-14 days to be read.

## 2023-01-13 LAB — VAS US ABI WITH/WO TBI
Left ABI: 1.11
Right ABI: 1.06

## 2023-01-14 ENCOUNTER — Encounter: Payer: Self-pay | Admitting: Nurse Practitioner

## 2023-01-14 DIAGNOSIS — M79605 Pain in left leg: Secondary | ICD-10-CM | POA: Insufficient documentation

## 2023-01-14 NOTE — Assessment & Plan Note (Signed)
Straight left leg raise positive. Given patient symptoms will perform imaging of the back and hip. If no improvement would refer her to Ortho for further evaluation and management. Advised patient to alternate hot and cold compress. Take Tylenol arthritis as needed for pain management.

## 2023-01-15 ENCOUNTER — Encounter: Payer: Self-pay | Admitting: Oncology

## 2023-01-15 ENCOUNTER — Other Ambulatory Visit: Payer: Self-pay

## 2023-01-29 ENCOUNTER — Other Ambulatory Visit: Payer: Self-pay

## 2023-01-29 MED ORDER — GABAPENTIN 100 MG PO CAPS
100.0000 mg | ORAL_CAPSULE | Freq: Three times a day (TID) | ORAL | 1 refills | Status: DC
  Filled 2023-01-29: qty 90, 15d supply, fill #0
  Filled 2023-02-21: qty 90, 15d supply, fill #1

## 2023-01-30 ENCOUNTER — Ambulatory Visit: Payer: Medicare Other | Attending: Medical

## 2023-01-30 DIAGNOSIS — I5020 Unspecified systolic (congestive) heart failure: Secondary | ICD-10-CM

## 2023-01-30 LAB — ECHOCARDIOGRAM LIMITED
AV Mean grad: 13 mm[Hg]
AV Peak grad: 20.8 mm[Hg]
Ao pk vel: 2.28 m/s
Area-P 1/2: 2.73 cm2

## 2023-01-30 MED ORDER — PERFLUTREN LIPID MICROSPHERE
1.0000 mL | INTRAVENOUS | Status: AC | PRN
  Administered 2023-01-30: 3 mL via INTRAVENOUS

## 2023-02-02 ENCOUNTER — Ambulatory Visit: Payer: Medicare Other | Attending: Medical | Admitting: Medical

## 2023-02-02 ENCOUNTER — Encounter: Payer: Self-pay | Admitting: Medical

## 2023-02-02 VITALS — BP 110/68 | HR 76 | Ht 66.0 in | Wt 194.2 lb

## 2023-02-02 DIAGNOSIS — E782 Mixed hyperlipidemia: Secondary | ICD-10-CM

## 2023-02-02 DIAGNOSIS — I255 Ischemic cardiomyopathy: Secondary | ICD-10-CM

## 2023-02-02 DIAGNOSIS — I2510 Atherosclerotic heart disease of native coronary artery without angina pectoris: Secondary | ICD-10-CM

## 2023-02-02 DIAGNOSIS — I5022 Chronic systolic (congestive) heart failure: Secondary | ICD-10-CM

## 2023-02-02 NOTE — Patient Instructions (Signed)
 Medication Instructions:  Your physician recommends that you continue on your current medications as directed. Please refer to the Current Medication list given to you today.   *If you need a refill on your cardiac medications before your next appointment, please call your pharmacy*   Lab Work: No labs ordered today    Testing/Procedures: No test ordered today    Follow-Up: At [facility], you and your health needs are our priority.  As part of our continuing mission to provide you with exceptional heart care, we have created designated Provider Care Teams.  These Care Teams include your primary Cardiologist (physician) and Advanced Practice Providers (APPs -  Physician Assistants and Nurse Practitioners) who all work together to provide you with the care you need, when you need it.  We recommend signing up for the patient portal called "MyChart".  Sign up information is provided on this After Visit Summary.  MyChart is used to connect with patients for Virtual Visits (Telemedicine).  Patients are able to view lab/test results, encounter notes, upcoming appointments, etc.  Non-urgent messages can be sent to your provider as well.   To learn more about what you can do with MyChart, go to [email or web address].    Your next appointment:   3 month(s)  Provider:   You may see Lorine Bears, MD or one of the following Advanced Practice Providers on your designated Care Team:   Nicolasa Ducking, NP Eula Listen, PA-C Cadence Fransico Michael, PA-C Charlsie Quest, NP Carlos Levering, NP

## 2023-02-02 NOTE — Progress Notes (Unsigned)
Cardiology Office Note:    Date:  02/02/2023   ID:  [name], DOB 1945/03/19, MRN [id number]  PCP:  Sherlene Shams, MD  [facility] Cardiologist:  Lorine Bears, MD  [facility] Electrophysiologist:  None   Referring MD: Sherlene Shams, MD   Chief Complaint: 2 month follow-up  History of Present Illness:    [name] is a 78 y.o. 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   Patient was sent to the ER 11/17/2022 for chest pain and elevated troponin.  High-sensitivity troponin elevated to 1569.  EKG showed ST depression in the lateral leads.  Heart cath showed diffuse and moderately calcified arteries with severe two-vessel CAD.  Culprit was likely plaque rupture from OM1 and mid left circumflex treated with successful bifurcation angioplasty and kissing stent placement to OM1/mid left circumflex using 2 drug-eluting stents.  Patient was started on DAPT for least 12 months with aspirin and Brilinta.  Echo showed EF 35 to 40%, mild hypokinesis of the inferoseptal inferior wall and moderate HK of the entire anterolateral, inferolateral and anterior walls with normal RV SF and RVSP.  The patient underwent staged PCI of the RCA on 12/11/2022 and discharged on aspirin and Brilinta for least 12 months. Cath showed widely patent left circumflex and mildly elevated LVEDP.   Patient was last seen December 2024 and reported that breathing was better.  She reported muscle ache on the left side after taking Crestor.repeat echo showed LVEF 45-50%, G1DD, mild MR, mild aortic valve calcification with mild valve stenosis.  Today, the patient reports she is still having leg pain, hip pain and back pain. She had xrays showing osteoarthritis, spurs  and degenerative disc changes. She was given gabapentin.  Gabapentin and tylenol has not helped. She is seeing PCP, ortho, and physiatrist. She will need injection of lidocaine nest Wednesday. They are wanting to do an MRI, which is OK from a cardiac perspective.  She denies chest pain. She has SOB from the Brilinta, it's overall tolerable. She will wait to do cardiac rehab. She has lost 25-30lbs.  Past Medical History:  Diagnosis Date   Asthma    Asthma due to environmental allergies    grass, mold, trees, dust   Cataract    Cellulitis    LEFT LEG   Diabetes mellitus    CONTROLLED ON DIET ALONE   Edema leg    LEFT LEG...CHRONIC   Endometrial hyperplasia    External hemorrhoid    Fatty liver    Glaucoma    Hepatic cyst    STABLE PER 05/20/2008 ULTRASOUND   Hypertension    borderline...controlled since taking herself of HCTZ IN JULY   IBS (irritable bowel syndrome)    Murmur, cardiac    Neuropathy    OSA on CPAP    Osteoarthritis    Psoriasis    Sleep apnea    CPAP    Past Surgical History:  Procedure Laterality Date   BONE DENSITY TEST  01/17/2004   CATARACT EXTRACTION, BILATERAL Bilateral    May 2023 and June 2023   CORONARY STENT INTERVENTION N/A 11/20/2022   Procedure: CORONARY STENT INTERVENTION;  Surgeon: Iran Ouch, MD;  Location: [facility] INVASIVE CV LAB;  Service: Cardiovascular;  Laterality: N/A;  CORONARY STENT INTERVENTION Right 12/11/2022   Procedure: CORONARY STENT INTERVENTION;  Surgeon: Iran Ouch, MD;  Location: [facility] INVASIVE CV LAB;  Service: Cardiovascular;  Laterality: Right;   HYSTEROSCOPY WITH D & C  01/16/2005   BC OF ABNORAL PAP AND ENDOMETRIAL HYPERPLASIA   LEFT HEART CATH AND CORONARY ANGIOGRAPHY N/A 11/20/2022   Procedure: LEFT HEART CATH AND CORONARY ANGIOGRAPHY;  Surgeon: Iran Ouch, MD;  Location: [facility] INVASIVE CV LAB;  Service: Cardiovascular;  Laterality: N/A;   LEFT HEART CATH AND CORONARY ANGIOGRAPHY N/A 12/11/2022    Procedure: LEFT HEART CATH AND CORONARY ANGIOGRAPHY;  Surgeon: Iran Ouch, MD;  Location: [facility] INVASIVE CV LAB;  Service: Cardiovascular;  Laterality: N/A;   ONE VAGINAL DELIVERY     UMBILICAL HERNIA REPAIR  01/17/2008   VAGINAL HYSTERECTOMY  01/17/2008    Current Medications: Current Meds  Medication Sig   albuterol (VENTOLIN HFA) 108 (90 Base) MCG/ACT inhaler Inhale 2 puffs into the lungs every 6 (six) hours as needed for wheezing or shortness of breath.   ALPRAZolam (XANAX) 0.25 MG tablet Take 1 tablet (0.25 mg total) by mouth 2 (two) times daily as needed for anxiety.   aspirin EC 81 MG tablet Take 1 tablet (81 mg total) by mouth daily. Swallow whole.   bimatoprost (LUMIGAN) 0.01 % SOLN Place 1 drop into both eyes at bedtime.   budesonide (PULMICORT) 180 MCG/ACT inhaler Inhale 2 puffs into the lungs 2 (two) times daily.   carvedilol (COREG) 3.125 MG tablet Take 1 tablet (3.125 mg total) by mouth 2 (two) times daily with a meal.   cetirizine (ZYRTEC) 10 MG tablet Take 10 mg by mouth daily.   EPINEPHrine 0.3 mg/0.3 mL IJ SOAJ injection as needed.   gabapentin (NEURONTIN) 100 MG capsule Take 1 capsule (100 mg total) by mouth 3 (three) times daily. If no improvement after 4 or 5 days increase to 2 capsules 3 times a day   losartan (COZAAR) 25 MG tablet Take 0.5 tablets (12.5 mg total) by mouth daily.   MILK THISTLE PO Take 1 capsule by mouth daily.    nitroGLYCERIN (NITROSTAT) 0.4 MG SL tablet Place 1 tablet (0.4 mg total) under the tongue every 5 (five) minutes for up to 3 doses as needed for chest pain.   ondansetron (ZOFRAN) 4 MG tablet Take 1 tablet (4 mg total) by mouth every 8 (eight) hours as needed for nausea or vomiting.   Probiotic Product (PROBIOTIC COMPLEX ACIDOPHILUS PO) Take 1 capsule by mouth daily.   sodium chloride (OCEAN) 0.65 % SOLN nasal spray Place 1 spray into the nose as needed.   ticagrelor (BRILINTA) 90 MG TABS tablet Take 1 tablet (90 mg total) by mouth 2  (two) times daily.   tildrakizumab-asmn (ILUMYA) 100 MG/ML subcutaneous injection Inject 100 mg into the skin every 3 (three) months.     Allergies:   Meloxicam, Statins, Sulfa drugs cross reactors, and Zostavax [zoster vaccine live]   Social History   Socioeconomic History   Marital status: Married    Spouse name: Not on file   Number of children: 2   Years of education: Not on file   Highest education level: Some college, no degree  Occupational History   Occupation: RETIRED  Tobacco Use   Smoking status: Former    Current packs/day: 0.00    Types: Cigarettes    Quit date: 01/16/1978    Years since quitting: 45.0   Smokeless tobacco: Never   Tobacco comments:  REMOTELY QUIT IN 1987 AFTER A FEW YEARS OF USE  Vaping Use   Vaping status: Never Used  Substance and Sexual Activity   Alcohol use: No   Drug use: No   Sexual activity: Not on file  Other Topics Concern   Not on file  Social History Narrative   Lives with husband.   Social Drivers of Corporate investment banker Strain: Low Risk  (02/01/2023)   Received from [facility]   Overall Financial Resource Strain (CARDIA)    Difficulty of Paying Living Expenses: Not hard at all  Food Insecurity: No Food Insecurity (02/01/2023)   Received from [facility]   Hunger Vital Sign    Worried About Running Out of Food in the Last Year: Never true    Ran Out of Food in the Last Year: Never true  Transportation Needs: No Transportation Needs (02/01/2023)   Received from [facility] - Transportation    In the past 12 months, has lack of transportation kept you from medical appointments or from getting medications?: No    Lack of Transportation (Non-Medical): No  Recent Concern: Transportation Needs - Unmet Transportation Needs (11/21/2022)   PRAPARE - Transportation    Lack of Transportation (Medical): Yes    Lack of Transportation (Non-Medical): Yes  Physical  Activity: Unknown (07/30/2018)   Exercise Vital Sign    Days of Exercise per Week: 0 days    Minutes of Exercise per Session: Not on file  Stress: No Stress Concern Present (07/30/2018)   [employer] of Occupational Health - Occupational Stress Questionnaire    Feeling of Stress : Not at all  Social Connections: Not on file     Family History: The patient's family history includes Heart disease (age of onset: 21) in her father; Mental illness in her mother. There is no history of Cancer or Breast cancer.  ROS:   Please see the history of present illness.     All other systems reviewed and are negative.  EKGs/Labs/Other Studies Reviewed:    The following studies were reviewed today:  Echo limited 01/2023 1. Left ventricular ejection fraction, by estimation, is 45 to 50%. The  left ventricle has moderately decreased function. The left ventricle  demonstrates global hypokinesis. Left ventricular diastolic parameters are  consistent with Grade I diastolic  dysfunction (impaired relaxation).   2. Right ventricular systolic function is normal. The right ventricular  size is normal.   3. The mitral valve is normal in structure. Mild mitral valve  regurgitation. No evidence of mitral stenosis.   4. The aortic valve is calcified. There is moderate calcification of the  aortic valve. Aortic valve regurgitation is not visualized. Mild aortic  valve stenosis. Aortic valve mean gradient measures 13.0 mmHg.   5. The inferior vena cava is normal in size with greater than 50%  respiratory variability, suggesting right atrial pressure of 3 mmHg.   LHC staged PCI 12/11/22   Ost LM lesion is 30% stenosed.   Prox LAD to Mid LAD lesion is 30% stenosed.   Mid LAD lesion is 40% stenosed.   Mid LAD to Dist LAD lesion is 30% stenosed.   Prox RCA to Mid RCA lesion is 30% stenosed.   Dist RCA lesion is 70% stenosed with 90% stenosed side branch in RPDA.   1st Diag lesion is 50% stenosed.   Ost  RCA to Prox RCA lesion is 50% stenosed.   RPAV lesion  is 40% stenosed.   Non-stenotic Prox Cx to Mid Cx lesion was previously treated.   Non-stenotic 1st Mrg lesion was previously treated.   A drug-eluting stent was successfully placed using a STENT ONYX FRONTIER 2.5X22.   Post intervention, there is a 0% residual stenosis.   Post intervention, the side branch was reduced to 0% residual stenosis.   1.  Widely patent left circumflex stent with no significant restenosis.  Severe distal RCA stenosis extending into the right PDA.  Moderate ostial RCA stenosis with pressure dampening with guide catheter engagement.  However, there was no pressure dampening with diagnostic catheter engagement during most recent angiography. 2.  Left ventricular angiography was not performed.  Mildly elevated left ventricular end-diastolic pressure at 18 mmHg. 3.  Successful angioplasty and drug-eluting stent placement to the distal right coronary artery extending into the right PDA.   Recommendations: Continue dual antiplatelet therapy for at least 6 months. Continue aggressive treatment of risk factors.   LHC 11/20/23   Prox RCA lesion is 30% stenosed.   Dist RCA lesion is 70% stenosed with 90% stenosed side branch in RPDA.   Prox LAD to Mid LAD lesion is 30% stenosed.   1st Diag lesion is 50% stenosed.   Mid LAD lesion is 40% stenosed.   Mid LAD to Dist LAD lesion is 30% stenosed.   Ost LM lesion is 30% stenosed.   1st Mrg lesion is 99% stenosed.   Prox Cx to Mid Cx lesion is 90% stenosed.   A drug-eluting stent was successfully placed using a STENT ONYX FRONTIER 2.5X26.   A drug-eluting stent was successfully placed using a STENT ONYX FRONTIER 2.5X22.   Post intervention, there is a 0% residual stenosis.   Post intervention, there is a 0% residual stenosis.   1.  Diffuse and moderately calcified coronary arteries with severe two-vessel coronary artery disease.  The culprit for myocardial infarction seems  to be plaque rupture and OM1 and mid left circumflex.  In addition, there is severe stenosis in the distal RCA extending into the ostium of the right PDA.  The LAD has moderate disease in multiple areas. 2.  Left ventricular angiography was not performed.  EF was moderately reduced by echo.  LVEDP was mildly elevated. 3.  Successful complex bifurcation angioplasty and kissing stent placement to OM1/mid left circumflex using 2 drug-eluting stents.   Recommendations: Dual antiplatelet therapy for at least 12 months and preferably longer. The patient is intolerant to statins but will try small dose rosuvastatin 5 mg daily. I switch Toprol to carvedilol for better blood pressure control. Recommend staged RCA PCI in few weeks.     Echo 11/18/22 1. Left ventricular ejection fraction, by estimation, is 35 to 40%. The  left ventricle has moderately decreased function. The left ventricle  demonstrates regional wall motion abnormalities (see scoring  diagram/findings for description). There is mild  concentric left ventricular hypertrophy. Left ventricular diastolic  parameters are indeterminate. There is mild hypokinesis of the left  ventricular, entire inferoseptal wall and inferior wall. There is moderate  hypokinesis of the left ventricular, entire   anterolateral wall, inferolateral wall and anterior wall.   2. Right ventricular systolic function is normal. The right ventricular  size is not well visualized. There is normal pulmonary artery systolic  pressure.   3. The mitral valve is normal in structure. Mild mitral valve  regurgitation. No evidence of mitral stenosis.   4. The aortic valve was not well visualized. There is moderate  calcification of the aortic valve. There is mild thickening of the aortic  valve. Aortic valve regurgitation is not visualized. Mild aortic valve  stenosis.   5. The inferior vena cava is normal in size with greater than 50%  respiratory variability, suggesting  right atrial pressure of 3 mmHg.   Comparison(s): No prior Echocardiogram.   EKG:  EKG is ordered today.  The ekg ordered today demonstrates NSR 76bpm, sinus arrythmia, LAD, TWI aVL  Recent Labs: 11/13/2022: TSH 3.43 12/21/2022: ALT 17; BUN 13; Creatinine, Ser 0.95; Potassium 4.8; Sodium 139 12/26/2022: Hemoglobin 14.3; Platelets 323  Recent Lipid Panel    Component Value Date/Time   CHOL 189 11/13/2022 0845   TRIG 150.0 (H) 11/13/2022 0845   HDL 51.40 11/13/2022 0845   CHOLHDL 4 11/13/2022 0845   VLDL 30.0 11/13/2022 0845   LDLCALC 108 (H) 11/13/2022 0845   LDLDIRECT 127.0 11/13/2022 0845     Physical Exam:    VS:  BP 110/68 (BP Location: Left Arm, Patient Position: Sitting, Cuff Size: Normal)   Pulse 76   Ht 5\' 6"  (1.676 m)   Wt 194 lb 3.2 oz (88.1 kg)   SpO2 97%   BMI 31.34 kg/m     Wt Readings from Last 3 Encounters:  02/02/23 194 lb 3.2 oz (88.1 kg)  01/11/23 196 lb 12.8 oz (89.3 kg)  12/29/22 202 lb (91.6 kg)     GEN:  Well nourished, well developed in no acute distress HEENT: Normal NECK: No JVD; No carotid bruits LYMPHATICS: No lymphadenopathy CARDIAC: RRR, no murmurs, rubs, gallops RESPIRATORY:  Clear to auscultation without rales, wheezing or rhonchi  ABDOMEN: Soft, non-tender, non-distended MUSCULOSKELETAL:  No edema; No deformity  SKIN: Warm and dry NEUROLOGIC:  Alert and oriented x 3 PSYCHIATRIC:  Normal affect   ASSESSMENT:    1. Coronary artery disease, unspecified vessel or lesion type, unspecified whether angina present, unspecified whether native or transplanted heart   2. Chronic systolic heart failure (HCC)   3. Ischemic cardiomyopathy   4. Hyperlipidemia, mixed    PLAN:    In order of problems listed above:  CAD NSTEMI S/p complex bifurcation angioplasty and kissing stent placement to the OM1 and mLCX using 2 DES placements, with subsequent staged PCI to the RCA. She is on DAPT with ASA and Brilinta. Brilinta causes shortness of  breath, this is tolerable. She is unable to do cardiac rehab due to left back/hip/leg pain, which is being worked up. It is OK to pursue MRI from a cardiac perspective. Continue Coreg, Losartan, SL NTG and Zetia.   HFrEF ICM Repeat echo showed improved LVEF 45-50%, G1DD, and mild valve disease. She is taking Coreg and Losartan. We discussed addition of SGLT2i, but the patient is weary of starting a new medication. She is euvolemic. No changes at this time.  HLD LDL 108, TG 150, HDL 51, total chol 189. She did not tolerate statins. She has Zetia, she was encouraged to take it. She is not interested in Repathat at this time.   Disposition: Follow up in 3 month(s) with MD/APP    Signed, Evolette Pendell David Stall, PA-C  02/02/2023 2:15 PM    [facility]

## 2023-02-08 ENCOUNTER — Emergency Department: Payer: Medicare Other

## 2023-02-08 ENCOUNTER — Other Ambulatory Visit: Payer: Self-pay

## 2023-02-08 ENCOUNTER — Inpatient Hospital Stay
Admission: EM | Admit: 2023-02-08 | Discharge: 2023-02-16 | DRG: 377 | Disposition: A | Discharge status: Home / Self Care | Payer: Medicare Other | Attending: Internal Medicine | Admitting: Internal Medicine

## 2023-02-08 DIAGNOSIS — K5793 Diverticulitis of intestine, part unspecified, without perforation or abscess with bleeding: Secondary | ICD-10-CM

## 2023-02-08 DIAGNOSIS — I25118 Atherosclerotic heart disease of native coronary artery with other forms of angina pectoris: Secondary | ICD-10-CM | POA: Diagnosis not present

## 2023-02-08 DIAGNOSIS — Z23 Encounter for immunization: Secondary | ICD-10-CM | POA: Diagnosis not present

## 2023-02-08 DIAGNOSIS — D3501 Benign neoplasm of right adrenal gland: Secondary | ICD-10-CM | POA: Diagnosis present

## 2023-02-08 DIAGNOSIS — E785 Hyperlipidemia, unspecified: Secondary | ICD-10-CM | POA: Diagnosis present

## 2023-02-08 DIAGNOSIS — K922 Gastrointestinal hemorrhage, unspecified: Secondary | ICD-10-CM

## 2023-02-08 DIAGNOSIS — I472 Ventricular tachycardia, unspecified: Secondary | ICD-10-CM | POA: Diagnosis present

## 2023-02-08 DIAGNOSIS — E1159 Type 2 diabetes mellitus with other circulatory complications: Secondary | ICD-10-CM | POA: Diagnosis not present

## 2023-02-08 DIAGNOSIS — L400 Psoriasis vulgaris: Secondary | ICD-10-CM | POA: Diagnosis present

## 2023-02-08 DIAGNOSIS — Z87891 Personal history of nicotine dependence: Secondary | ICD-10-CM | POA: Diagnosis not present

## 2023-02-08 DIAGNOSIS — Z5982 Transportation insecurity: Secondary | ICD-10-CM

## 2023-02-08 DIAGNOSIS — Z7902 Long term (current) use of antithrombotics/antiplatelets: Secondary | ICD-10-CM

## 2023-02-08 DIAGNOSIS — Z6832 Body mass index (BMI) 32.0-32.9, adult: Secondary | ICD-10-CM | POA: Diagnosis not present

## 2023-02-08 DIAGNOSIS — R571 Hypovolemic shock: Secondary | ICD-10-CM | POA: Diagnosis present

## 2023-02-08 DIAGNOSIS — I2510 Atherosclerotic heart disease of native coronary artery without angina pectoris: Secondary | ICD-10-CM | POA: Diagnosis present

## 2023-02-08 DIAGNOSIS — R578 Other shock: Secondary | ICD-10-CM | POA: Diagnosis present

## 2023-02-08 DIAGNOSIS — I252 Old myocardial infarction: Secondary | ICD-10-CM

## 2023-02-08 DIAGNOSIS — Z8249 Family history of ischemic heart disease and other diseases of the circulatory system: Secondary | ICD-10-CM

## 2023-02-08 DIAGNOSIS — F419 Anxiety disorder, unspecified: Secondary | ICD-10-CM | POA: Diagnosis present

## 2023-02-08 DIAGNOSIS — D62 Acute posthemorrhagic anemia: Secondary | ICD-10-CM | POA: Diagnosis present

## 2023-02-08 DIAGNOSIS — Z79899 Other long term (current) drug therapy: Secondary | ICD-10-CM

## 2023-02-08 DIAGNOSIS — J4520 Mild intermittent asthma, uncomplicated: Secondary | ICD-10-CM | POA: Diagnosis present

## 2023-02-08 DIAGNOSIS — G4733 Obstructive sleep apnea (adult) (pediatric): Secondary | ICD-10-CM | POA: Diagnosis present

## 2023-02-08 DIAGNOSIS — Z7982 Long term (current) use of aspirin: Secondary | ICD-10-CM | POA: Diagnosis not present

## 2023-02-08 DIAGNOSIS — E1140 Type 2 diabetes mellitus with diabetic neuropathy, unspecified: Secondary | ICD-10-CM | POA: Diagnosis present

## 2023-02-08 DIAGNOSIS — Z7401 Bed confinement status: Secondary | ICD-10-CM

## 2023-02-08 DIAGNOSIS — I10 Essential (primary) hypertension: Secondary | ICD-10-CM | POA: Diagnosis not present

## 2023-02-08 DIAGNOSIS — K5733 Diverticulitis of large intestine without perforation or abscess with bleeding: Secondary | ICD-10-CM | POA: Diagnosis present

## 2023-02-08 DIAGNOSIS — Z7951 Long term (current) use of inhaled steroids: Secondary | ICD-10-CM | POA: Diagnosis not present

## 2023-02-08 DIAGNOSIS — Z9861 Coronary angioplasty status: Secondary | ICD-10-CM

## 2023-02-08 DIAGNOSIS — Z9841 Cataract extraction status, right eye: Secondary | ICD-10-CM

## 2023-02-08 DIAGNOSIS — Z882 Allergy status to sulfonamides status: Secondary | ICD-10-CM

## 2023-02-08 DIAGNOSIS — K5732 Diverticulitis of large intestine without perforation or abscess without bleeding: Secondary | ICD-10-CM

## 2023-02-08 DIAGNOSIS — R935 Abnormal findings on diagnostic imaging of other abdominal regions, including retroperitoneum: Secondary | ICD-10-CM | POA: Diagnosis not present

## 2023-02-08 DIAGNOSIS — K449 Diaphragmatic hernia without obstruction or gangrene: Secondary | ICD-10-CM | POA: Diagnosis present

## 2023-02-08 DIAGNOSIS — E119 Type 2 diabetes mellitus without complications: Secondary | ICD-10-CM

## 2023-02-08 DIAGNOSIS — E872 Acidosis, unspecified: Secondary | ICD-10-CM

## 2023-02-08 DIAGNOSIS — I110 Hypertensive heart disease with heart failure: Secondary | ICD-10-CM | POA: Diagnosis present

## 2023-02-08 DIAGNOSIS — Z90710 Acquired absence of both cervix and uterus: Secondary | ICD-10-CM

## 2023-02-08 DIAGNOSIS — I493 Ventricular premature depolarization: Secondary | ICD-10-CM | POA: Diagnosis present

## 2023-02-08 DIAGNOSIS — G9341 Metabolic encephalopathy: Secondary | ICD-10-CM | POA: Diagnosis present

## 2023-02-08 DIAGNOSIS — I5022 Chronic systolic (congestive) heart failure: Secondary | ICD-10-CM | POA: Insufficient documentation

## 2023-02-08 DIAGNOSIS — Z888 Allergy status to other drugs, medicaments and biological substances status: Secondary | ICD-10-CM

## 2023-02-08 DIAGNOSIS — Z818 Family history of other mental and behavioral disorders: Secondary | ICD-10-CM

## 2023-02-08 DIAGNOSIS — Z955 Presence of coronary angioplasty implant and graft: Secondary | ICD-10-CM

## 2023-02-08 DIAGNOSIS — E669 Obesity, unspecified: Secondary | ICD-10-CM | POA: Diagnosis present

## 2023-02-08 DIAGNOSIS — D72829 Elevated white blood cell count, unspecified: Secondary | ICD-10-CM | POA: Diagnosis not present

## 2023-02-08 DIAGNOSIS — Z9842 Cataract extraction status, left eye: Secondary | ICD-10-CM

## 2023-02-08 HISTORY — DX: Gastrointestinal hemorrhage, unspecified: K92.2

## 2023-02-08 HISTORY — DX: Diverticulitis of intestine, part unspecified, without perforation or abscess with bleeding: K57.93

## 2023-02-08 LAB — CBC
HCT: 34.4 % — ABNORMAL LOW (ref 36.0–46.0)
Hemoglobin: 11.7 g/dL — ABNORMAL LOW (ref 12.0–15.0)
MCH: 29.9 pg (ref 26.0–34.0)
MCHC: 34 g/dL (ref 30.0–36.0)
MCV: 88 fL (ref 80.0–100.0)
Platelets: 352 10*3/uL (ref 150–400)
RBC: 3.91 MIL/uL (ref 3.87–5.11)
RDW: 12.6 % (ref 11.5–15.5)
WBC: 20.2 10*3/uL — ABNORMAL HIGH (ref 4.0–10.5)
nRBC: 0 % (ref 0.0–0.2)

## 2023-02-08 LAB — BASIC METABOLIC PANEL
Anion gap: 12 (ref 5–15)
BUN: 22 mg/dL (ref 8–23)
CO2: 20 mmol/L — ABNORMAL LOW (ref 22–32)
Calcium: 8.5 mg/dL — ABNORMAL LOW (ref 8.9–10.3)
Chloride: 104 mmol/L (ref 98–111)
Creatinine, Ser: 1.02 mg/dL — ABNORMAL HIGH (ref 0.44–1.00)
GFR, Estimated: 56 mL/min — ABNORMAL LOW (ref >60)
Glucose, Bld: 231 mg/dL — ABNORMAL HIGH (ref 70–99)
Potassium: 4.2 mmol/L (ref 3.5–5.1)
Sodium: 136 mmol/L (ref 135–145)

## 2023-02-08 LAB — URINALYSIS, COMPLETE (UACMP) WITH MICROSCOPIC
Bilirubin Urine: NEGATIVE
Glucose, UA: NEGATIVE mg/dL
Ketones, ur: NEGATIVE mg/dL
Nitrite: NEGATIVE
Protein, ur: NEGATIVE mg/dL
Specific Gravity, Urine: 1.018 (ref 1.005–1.030)
pH: 5 (ref 5.0–8.0)

## 2023-02-08 LAB — LACTIC ACID, PLASMA: Lactic Acid, Venous: 3 mmol/L (ref 0.5–1.9)

## 2023-02-08 LAB — HEMOGLOBIN: Hemoglobin: 11.3 g/dL — ABNORMAL LOW (ref 12.0–15.0)

## 2023-02-08 LAB — BRAIN NATRIURETIC PEPTIDE: B Natriuretic Peptide: 150.3 pg/mL — ABNORMAL HIGH (ref 0.0–100.0)

## 2023-02-08 MED ORDER — ONDANSETRON HCL 4 MG PO TABS: 4.0000 mg | ORAL_TABLET | Freq: Four times a day (QID) | ORAL | Status: DC | PRN

## 2023-02-08 MED ORDER — LACTATED RINGERS IV SOLN: INTRAVENOUS | Status: AC

## 2023-02-08 MED ORDER — ACETAMINOPHEN 650 MG RE SUPP: 650.0000 mg | Freq: Four times a day (QID) | RECTAL | Status: DC | PRN

## 2023-02-08 MED ORDER — INSULIN ASPART 100 UNIT/ML IJ SOLN
0.0000 [IU] | Freq: Three times a day (TID) | INTRAMUSCULAR | Status: DC
Start: 2023-02-09 — End: 2023-02-11
  Filled 2023-02-08 (×2): qty 1

## 2023-02-08 MED ORDER — SODIUM CHLORIDE 0.9% FLUSH
3.0000 mL | Freq: Two times a day (BID) | INTRAVENOUS | Status: DC
  Administered 2023-02-08 – 2023-02-16 (×15): 3 mL via INTRAVENOUS

## 2023-02-08 MED ORDER — ASPIRIN 81 MG PO TBEC
81.0000 mg | DELAYED_RELEASE_TABLET | Freq: Every day | ORAL | Status: DC
  Administered 2023-02-08 – 2023-02-14 (×7): 81 mg via ORAL
  Filled 2023-02-08 (×7): qty 1

## 2023-02-08 MED ORDER — LATANOPROST 0.005 % OP SOLN
1.0000 [drp] | Freq: Every day | OPHTHALMIC | Status: DC
  Administered 2023-02-08 – 2023-02-15 (×8): 1 [drp] via OPHTHALMIC
  Filled 2023-02-08 (×3): qty 2.5

## 2023-02-08 MED ORDER — SODIUM CHLORIDE 0.9 % IV BOLUS
1000.0000 mL | Freq: Once | INTRAVENOUS | Status: AC
  Administered 2023-02-08: 1000 mL via INTRAVENOUS

## 2023-02-08 MED ORDER — POLYETHYLENE GLYCOL 3350 17 G PO PACK: 17.0000 g | PACK | Freq: Every day | ORAL | Status: DC | PRN

## 2023-02-08 MED ORDER — GABAPENTIN 100 MG PO CAPS
200.0000 mg | ORAL_CAPSULE | Freq: Three times a day (TID) | ORAL | Status: DC
  Administered 2023-02-08 – 2023-02-16 (×22): 200 mg via ORAL
  Filled 2023-02-08 (×22): qty 2

## 2023-02-08 MED ORDER — IOHEXOL 350 MG/ML SOLN
100.0000 mL | Freq: Once | INTRAVENOUS | Status: AC | PRN
  Administered 2023-02-08: 100 mL via INTRAVENOUS

## 2023-02-08 MED ORDER — ACETAMINOPHEN 325 MG PO TABS
650.0000 mg | ORAL_TABLET | Freq: Four times a day (QID) | ORAL | Status: DC | PRN
  Administered 2023-02-10 – 2023-02-16 (×10): 650 mg via ORAL
  Filled 2023-02-08 (×10): qty 2

## 2023-02-08 MED ORDER — INSULIN ASPART 100 UNIT/ML IJ SOLN
0.0000 [IU] | Freq: Every day | INTRAMUSCULAR | Status: DC
Start: 2023-02-08 — End: 2023-02-11

## 2023-02-08 MED ORDER — PIPERACILLIN-TAZOBACTAM 3.375 G IVPB
3.3750 g | Freq: Three times a day (TID) | INTRAVENOUS | Status: DC
  Administered 2023-02-08 – 2023-02-16 (×22): 3.375 g via INTRAVENOUS
  Filled 2023-02-08 (×22): qty 50

## 2023-02-08 MED ORDER — CARVEDILOL 3.125 MG PO TABS
3.1250 mg | ORAL_TABLET | Freq: Two times a day (BID) | ORAL | Status: DC
  Administered 2023-02-08 – 2023-02-11 (×6): 3.125 mg via ORAL
  Filled 2023-02-08 (×6): qty 1

## 2023-02-08 MED ORDER — ONDANSETRON HCL 4 MG/2ML IJ SOLN: 4.0000 mg | Freq: Four times a day (QID) | INTRAMUSCULAR | Status: DC | PRN

## 2023-02-08 NOTE — Assessment & Plan Note (Addendum)
Diverticulitis.  CT abdomen with concern of diverticulitis, no active bleeding.  Patient was on DAPT with aspirin and Brilinta due to placement of 3 recent cardiac stents. Current hemoglobin of 10, it was 14.32 months ago. GI was consulted-recommending tagged RBC scan for further bleeding which was ordered After discussing with cardiology we will continue with aspirin and hold Brilinta for now. -Continue with Zosyn -Monitor hemoglobin -Transfuse if below 8

## 2023-02-08 NOTE — Assessment & Plan Note (Signed)
Pressure currently within goal. -Holding home losartan for concern of GI bleed

## 2023-02-08 NOTE — H&P (Signed)
History and Physical    Patient: [name] DOB: 09-Jun-1945 DOA: 02/08/2023 DOS: the patient was seen and examined on 02/08/2023 PCP: Sherlene Shams, MD  Patient coming from: Home  Chief Complaint:  Chief Complaint  Patient presents with   Rectal Bleeding   HPI: [name] is a 78 y.o. 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  Patient noticed some bleeding during normal bowel movement this morning, followed by 2 more bowel movements which include moderate to large amount of bright red blood without any significant stool in it.  Patient was on aspirin and Brilinta due to recent PCI placed in November 2024. She had total of 3 episodes and after third 1 she became very lightheaded, dizzy and clammy so husband called the EMS.  Patient denies any fever or chills.  No chest pain or shortness of breath.  She thinks that her urine is cloudy but no other urinary symptoms.  No recent change in appetite or bowel habits.  ED course and data reviewed.  Afebrile with stable vitals, CBC with leukocytosis at 20.2, hemoglobin 11.7, decreased from 14.3  68-month ago, CO2 of 20, blood glucose 231, BUN 22, creatinine 1.02. CT angio abdomen for GI protocol with concern of acute inflammation involving the right colon near hepatic flexure, most compatible with acute diverticulitis.  No evidence of active GI bleeding.  Also noted a small right adrenal adenoma and a small hiatal hernia.  Patient was started on Zosyn.  GI and cardiology are both consulted.   Review of Systems: As mentioned in the history of present illness. All other systems reviewed and are negative. Past Medical History:  Diagnosis Date   Asthma    Asthma due to environmental allergies    grass, mold, trees, dust   Cataract     Cellulitis    LEFT LEG   Diabetes mellitus    CONTROLLED ON DIET ALONE   Edema leg    LEFT LEG...CHRONIC   Endometrial hyperplasia    External hemorrhoid    Fatty liver    Glaucoma    Hepatic cyst    STABLE PER 05/20/2008 ULTRASOUND   Hypertension    borderline...controlled since taking herself of HCTZ IN JULY   IBS (irritable bowel syndrome)    Murmur, cardiac    Neuropathy    OSA on CPAP    Osteoarthritis    Psoriasis    Sleep apnea    CPAP   Past Surgical History:  Procedure Laterality Date   BONE DENSITY TEST  01/17/2004   CATARACT EXTRACTION, BILATERAL Bilateral    May 2023 and June 2023   CORONARY STENT INTERVENTION N/A 11/20/2022   Procedure: CORONARY STENT INTERVENTION;  Surgeon: Iran Ouch, MD;  Location: [facility];  Service: Cardiovascular;  Laterality: N/A;   CORONARY STENT INTERVENTION Right 12/11/2022   Procedure: CORONARY STENT INTERVENTION;  Surgeon: Iran Ouch, MD;  Location: [facility];  Service: Cardiovascular;  Laterality: Right;   HYSTEROSCOPY WITH D & C  01/16/2005   BC OF ABNORAL PAP AND ENDOMETRIAL HYPERPLASIA   LEFT HEART CATH AND CORONARY ANGIOGRAPHY N/A 11/20/2022   Procedure: LEFT HEART CATH AND CORONARY ANGIOGRAPHY;  Surgeon: Iran Ouch, MD;  Location: [facility];  Service: Cardiovascular;  Laterality: N/A;  LEFT HEART CATH AND CORONARY ANGIOGRAPHY N/A 12/11/2022   Procedure: LEFT HEART CATH AND CORONARY ANGIOGRAPHY;  Surgeon: Iran Ouch, MD;  Location: [facility];  Service: Cardiovascular;  Laterality: N/A;   ONE VAGINAL DELIVERY     UMBILICAL HERNIA REPAIR  01/17/2008   VAGINAL HYSTERECTOMY  01/17/2008   Social History:  reports that she quit smoking about 45 years ago. Her smoking use included cigarettes. She has never used smokeless tobacco. She reports that she does not drink alcohol and does not use drugs.  Allergies  Allergen Reactions   Meloxicam Other (See Comments)     Hypertension, tachycardia   Statins Other (See Comments)    "Muscle weakness"   Sulfa Drugs Cross Reactors    Zostavax [Zoster Vaccine Live]     Bad reaction     Family History  Problem Relation Age of Onset   Mental illness Mother        DEMENTIA   Heart disease Father 62       MI.Marland KitchenMarland KitchenS/D AVR/CABG x3   Cancer Neg Hx        no ovarian colon breast   Breast cancer Neg Hx     Prior to Admission medications   Medication Sig Start Date End Date Taking? Authorizing Provider  albuterol (VENTOLIN HFA) 108 (90 Base) MCG/ACT inhaler Inhale 2 puffs into the lungs every 6 (six) hours as needed for wheezing or shortness of breath.    [provider]  ALPRAZolam Prudy Feeler) 0.25 MG tablet Take 1 tablet (0.25 mg total) by mouth 2 (two) times daily as needed for anxiety. 11/17/22   Sherlene Shams, MD  aspirin EC 81 MG tablet Take 1 tablet (81 mg total) by mouth daily. Swallow whole. 11/22/22   Sunnie Nielsen, DO  bimatoprost (LUMIGAN) 0.01 % SOLN Place 1 drop into both eyes at bedtime. 12/19/22     budesonide (PULMICORT) 180 MCG/ACT inhaler Inhale 2 puffs into the lungs 2 (two) times daily.    [provider]  carvedilol (COREG) 3.125 MG tablet Take 1 tablet (3.125 mg total) by mouth 2 (two) times daily with a meal. 11/30/22   Furth, Cadence H, PA-C  cetirizine (ZYRTEC) 10 MG tablet Take 10 mg by mouth daily.    [provider]  EPINEPHrine 0.3 mg/0.3 mL IJ SOAJ injection as needed. 07/23/18   [provider]  gabapentin (NEURONTIN) 100 MG capsule Take 1 capsule (100 mg total) by mouth 3 (three) times daily. If no improvement after 4 or 5 days increase to 2 capsules 3 times a day 01/29/23   Tera Partridge, PA  losartan (COZAAR) 25 MG tablet Take 0.5 tablets (12.5 mg total) by mouth daily. 11/30/22   Furth, Cadence H, PA-C  MILK THISTLE PO Take 1 capsule by mouth daily.     [provider]  nitroGLYCERIN (NITROSTAT) 0.4 MG SL tablet Place 1 tablet (0.4 mg  total) under the tongue every 5 (five) minutes for up to 3 doses as needed for chest pain. 11/21/22   Sunnie Nielsen, DO  ondansetron (ZOFRAN) 4 MG tablet Take 1 tablet (4 mg total) by mouth every 8 (eight) hours as needed for nausea or vomiting. 09/21/21   Sherlene Shams, MD  Probiotic Product (PROBIOTIC COMPLEX ACIDOPHILUS PO) Take 1 capsule by mouth daily.    [provider]  sodium chloride (OCEAN) 0.65 % SOLN nasal spray Place 1 spray into the nose as needed.    [provider]  ticagrelor (  BRILINTA) 90 MG TABS tablet Take 1 tablet (90 mg total) by mouth 2 (two) times daily. 11/30/22   Furth, Cadence H, PA-C  tildrakizumab-asmn (ILUMYA) 100 MG/ML subcutaneous injection Inject 100 mg into the skin every 3 (three) months.    [provider]    Physical Exam: Vitals:   02/08/23 1400 02/08/23 1430 02/08/23 1600 02/08/23 1630  BP: (!) 120/105 130/77 126/68 124/69  Pulse:   68 69  Resp:   13 14  Temp:      SpO2:   100% 99%  Weight:      Height:        General: Vital signs reviewed.  Patient is well-developed and well-nourished, in no acute distress and cooperative with exam.  Head: Normocephalic and atraumatic. Eyes: EOMI, conjunctivae normal, no scleral icterus.  Neck: Supple, trachea midline, normal ROM,  Cardiovascular: RRR, S1 normal, S2 normal, no murmurs, gallops, or rubs. Pulmonary/Chest: Clear to auscultation bilaterally, no wheezes, rales, or rhonchi. Abdominal: Soft, mild right-sided tenderness, non-distended, BS +,  Extremities: No lower extremity edema bilaterally,  pulses symmetric and intact bilaterally. No cyanosis or clubbing. Neurological: A&O x3, Strength is normal and symmetric bilaterally, cranial nerve II-XII are grossly intact, no focal motor deficit, sensory intact to light touch bilaterally.  Psychiatric: Normal mood and affect.    Data Reviewed: Prior data reviewed as mentioned above  Assessment and Plan: * GI  bleed Diverticulitis.  CT abdomen with concern of diverticulitis, no active bleeding.  Patient was on DAPT with aspirin and Brilinta due to placement of 3 recent cardiac stents. Current hemoglobin of 11.7, it was 14.32 months ago. GI was consulted. After discussing with cardiology we will continue with aspirin and hold Brilinta for now. -Admit to medical telemetry -Start her on Zosyn -Monitor hemoglobin -Transfuse if below 8  CAD S/P percutaneous coronary angioplasty Patient has 3 recent stents placement, 2 on 11/4 and 1 on 11/2522 by cardiology and was started on aspirin and Brilinta. Tough situation as she will be high risk for restenosis. Discussed with cardiology and they are advising continue aspirin for now and hold Brilinta which might be switched to Plavix in a day or 2 -Continue home carvedilol -Continue with aspirin -Holding Brilinta  Diabetes mellitus (HCC) Check A1c -SSI  Chronic HFrEF (heart failure with reduced ejection fraction) (HCC) Echocardiogram done on 01/31/2023 with EF of 45 to 50% and grade 1 diastolic dysfunction. -Continue with carvedilol -Monitor volume status   Essential hypertension Pressure currently within goal. -Holding home losartan for concern of GI bleed  OSA on CPAP -Continue CPAP at night   Advance Care Planning:   Code Status: Full Code   Consults: Gastroenterology.  Cardiology  Family Communication: Discussed with husband at bedside  Severity of Illness: The appropriate patient status for this patient is INPATIENT. Inpatient status is judged to be reasonable and necessary in order to provide the required intensity of service to ensure the patient's safety. The patient's presenting symptoms, physical exam findings, and initial radiographic and laboratory data in the context of their chronic comorbidities is felt to place them at high risk for further clinical deterioration. Furthermore, it is not anticipated that the patient will be  medically stable for discharge from the hospital within 2 midnights of admission.   * I certify that at the point of admission it is my clinical judgment that the patient will require inpatient hospital care spanning beyond 2 midnights from the point of admission due to high intensity of service, high  risk for further deterioration and high frequency of surveillance required.*  This record has been created using Conservation officer, historic buildings. Errors have been sought and corrected,but may not always be located. Such creation errors do not reflect on the standard of care.   Author: Arnetha Courser, MD 02/08/2023 5:53 PM  For on call review [email or web address].

## 2023-02-08 NOTE — ED Provider Notes (Signed)
[facility] Provider Note    Event Date/Time   First MD Initiated Contact with Patient 02/08/23 1521     (approximate)   History   Rectal Bleeding   HPI  [name] is a 78 year old female with history of asthma, diabetes, HTN, CAD s/p PCI in November presenting to the emergency department for evaluation of GI bleeding.  This morning, patient had a formed bowel movement and when she wiped she noticed some bright red blood.  Shortly after she had the urge to go to the bathroom again and reports passing a moderate to large volume of bright red blood without significant stool.  Had a third episode of rectal bleeding with associated lightheadedness leading her to call EMS.  No syncope.  No history of prior GI bleeding.  Is on Brilinta and aspirin following cardiac stent placement.     Physical Exam   Triage Vital Signs: ED Triage Vitals  Encounter Vitals Group     BP 02/08/23 1334 138/78     Systolic BP Percentile --      Diastolic BP Percentile --      Pulse Rate 02/08/23 1334 85     Resp 02/08/23 1332 18     Temp 02/08/23 1334 97.9 F (36.6 C)     Temp src --      SpO2 02/08/23 1334 100 %     Weight 02/08/23 1333 194 lb 0.1 oz (88 kg)     Height 02/08/23 1333 5\' 6"  (1.676 m)     Head Circumference --      Peak Flow --      Pain Score 02/08/23 1332 0     Pain Loc --      Pain Education --      Exclude from Growth Chart --     Most recent vital signs: Vitals:   02/08/23 1400 02/08/23 1430  BP: (!) 120/105 130/77  Pulse:    Resp:    Temp:    SpO2:       General: Awake, interactive  CV:  Regular rate, good peripheral perfusion.  Resp:  Unlabored respirations, lungs clear to Tatian Abd:  Nondistended, soft, nontender to palpation, in hallway limiting physical exam, but obvious dried blood noted around rectum. Neuro:  Symmetric facial movement, fluid speech   ED Results / Procedures / Treatments   Labs (all labs ordered are listed,  but only abnormal results are displayed) Labs Reviewed  CBC - Abnormal; Notable for the following components:      Result Value   WBC 20.2 (*)    Hemoglobin 11.7 (*)    HCT 34.4 (*)    All other components within normal limits  BASIC METABOLIC PANEL - Abnormal; Notable for the following components:   CO2 20 (*)    Glucose, Bld 231 (*)    Creatinine, Ser 1.02 (*)    Calcium 8.5 (*)    GFR, Estimated 56 (*)    All other components within normal limits  TYPE AND SCREEN     EKG EKG independently reviewed interpreted by myself (ER attending) demonstrates:    RADIOLOGY Imaging independently reviewed and interpreted by myself demonstrates:  CT GI bleed study pending  PROCEDURES:  Critical Care performed: No  Procedures   MEDICATIONS ORDERED IN ED: Medications  sodium chloride 0.9 % bolus 1,000 mL (has no administration in time range)  iohexol (OMNIPAQUE) 350 MG/ML injection 100 mL (100 mLs Intravenous Contrast Given 02/08/23 1455)  IMPRESSION / MDM / ASSESSMENT AND PLAN / ED COURSE  I reviewed the triage vital signs and the nursing notes.  Differential diagnosis includes, but is not limited to, diverticulosis, AVM, other lower GI bleeding source, consideration for upper GI bleed source  Patient's presentation is most consistent with acute presentation with potential threat to life or bodily function.  78 year old female presenting to the emergency department for evaluation of GI bleeding.  Reportedly hypotensive for EMS, but BP improved here.  Does have significant leukocytosis though no obvious infectious source.  Does report recent steroid injection in her left hip.  Does have acute anemia here with a low hemoglobin 11.7, baseline around 14.  Ultimately anticipate admission for this patient.  Signed out to oncoming provider pending CT GI bleed protocol and disposition.      FINAL CLINICAL IMPRESSION(S) / ED DIAGNOSES   Final diagnoses:  Acute GI bleeding      Rx / DC Orders   ED Discharge Orders     None        Note:  This document was prepared using Dragon voice recognition software and may include unintentional dictation errors.   Trinna Post, MD 02/08/23 [phone]

## 2023-02-08 NOTE — Assessment & Plan Note (Signed)
Patient has 3 recent stents placement, 2 on 11/4 and 1 on 11/2522 by cardiology and was started on aspirin and Brilinta. Tough situation as she will be high risk for restenosis. Discussed with cardiology and they are advising continue aspirin for now and hold Brilinta which might be switched to Plavix in a day or 2 -Continue home carvedilol -Continue with aspirin -Holding Brilinta

## 2023-02-08 NOTE — Consult Note (Signed)
Pharmacy Antibiotic Note  [name] is a 78 y.o. female admitted on 02/08/2023 with  intra-abdominal infection . Patient presented to the ED with rectal bleeding and syncope. They were evaluated for GI bleed, but currently being treated for presumed diverticulitis. Pharmacy has been consulted for Zosyn dosing.  Today, 02/08/2023 Scr 1.02 (baseline 0.8 - 0.9) Lactic acid needs to be collected Bcx needs to be collected  Afebrile since admission  Plan: Start Zosyn 3.375 g Q8H Follow LOT and culture data Monitor renal function   Height: 5\' 6"  (167.6 cm) Weight: 88 kg (194 lb 0.1 oz) IBW/kg (Calculated) : 59.3  Temp (24hrs), Avg:97.9 F (36.6 C), Min:97.9 F (36.6 C), Max:97.9 F (36.6 C)  Recent Labs  Lab 02/08/23 1332  WBC 20.2*  CREATININE 1.02*    Estimated Creatinine Clearance: 50.8 mL/min (A) (by C-G formula based on SCr of 1.02 mg/dL (H)).    Allergies  Allergen Reactions   Meloxicam Other (See Comments)    Hypertension, tachycardia   Statins Other (See Comments)    "Muscle weakness"   Sulfa Drugs Cross Reactors    Zostavax [Zoster Vaccine Live]     Bad reaction    Antimicrobials this admission: 1/23 Zosyn  >>   Dose adjustments this admission: NA  Microbiology results: 1/23 BCx: needs to be collected  Thank you for allowing pharmacy to be a part of this patient's care.  Effie Shy, PharmD Pharmacy Resident  02/08/2023 5:58 PM

## 2023-02-08 NOTE — Assessment & Plan Note (Signed)
Echocardiogram done on 01/31/2023 with EF of 45 to 50% and grade 1 diastolic dysfunction. -Continue with carvedilol -Monitor volume status

## 2023-02-08 NOTE — ED Notes (Signed)
Dr. Nelson Chimes informed of pt's lactic acid of 3.0. See MAR for orders.

## 2023-02-08 NOTE — Assessment & Plan Note (Signed)
Continue CPAP at night ?

## 2023-02-08 NOTE — ED Provider Notes (Signed)
-----------------------------------------   4:10 PM on 02/08/2023 ----------------------------------------- Awaiting CT scan result     Patient has elevated white count with evidence of diverticulitis.  She does not meet sepsis criteria  at this time, she is not tachypneic she is not tachycardic and she is not febrile.  However, earlier she was noted to have hypotension and elevated heart rate.  I have ordered lactic and blood cultures via sepsis workup panel   Discussed with the patient's family as well as the patient agreeable with plan for treatment including GI bleeding observation, as well as treatment for presumed diverticulitis which may be contributing to her bleeding.  She is resting comfortably at this time.   Consulted with patient accepted to hospital service by Dr. Christel Mormon, MD 02/08/23 1736

## 2023-02-08 NOTE — Assessment & Plan Note (Signed)
Check A1c -SSI 

## 2023-02-08 NOTE — ED Provider Triage Note (Signed)
Emergency Medicine Provider Triage Evaluation Note  [name] , a 79 y.o. female  was evaluated in triage.  Pt complains of history of blood thinner use had bm today with large amount of blood, EMT notes 3 bowl full of blood and pt had hypotension, received fluid. Pt has no abd pain, NVD  Review of Systems  Positive:  Negative:   Physical Exam  BP 138/78   Pulse 85   Temp 97.9 F (36.6 C)   Resp 18   Ht 5\' 6"  (1.676 m)   Wt 88 kg   SpO2 100%   BMI 31.31 kg/m  Gen:   Awake, no distress   Resp:  Normal effort  MSK:   Moves extremities without difficulty  Other:  No abd tenderness   Medical Decision Making  Medically screening exam initiated at 1:35 PM.  Appropriate orders placed.  JAKIA NETTLETON was informed that the remainder of the evaluation will be completed by another provider, this initial triage assessment does not replace that evaluation, and the importance of remaining in the ED until their evaluation is complete.  VSS, NAD, no abd tenderness, will check CT gi bleed and labs including type and screen   Christen Bame, PA-C 02/08/23 1337

## 2023-02-08 NOTE — ED Triage Notes (Signed)
Pt to ED [facility] from home for rectal bleeding started today. Reports after 3rd episode of rectal bleeding felt like was going to pass out. Takes blood thinners.  Initially 80s systolic bp with ems, 20 R AC, given NS PTA

## 2023-02-09 ENCOUNTER — Inpatient Hospital Stay: Payer: Medicare Other

## 2023-02-09 DIAGNOSIS — I5022 Chronic systolic (congestive) heart failure: Secondary | ICD-10-CM

## 2023-02-09 DIAGNOSIS — K5793 Diverticulitis of intestine, part unspecified, without perforation or abscess with bleeding: Secondary | ICD-10-CM

## 2023-02-09 DIAGNOSIS — D72829 Elevated white blood cell count, unspecified: Secondary | ICD-10-CM | POA: Diagnosis not present

## 2023-02-09 DIAGNOSIS — G4733 Obstructive sleep apnea (adult) (pediatric): Secondary | ICD-10-CM

## 2023-02-09 DIAGNOSIS — K5732 Diverticulitis of large intestine without perforation or abscess without bleeding: Secondary | ICD-10-CM

## 2023-02-09 DIAGNOSIS — K5733 Diverticulitis of large intestine without perforation or abscess with bleeding: Secondary | ICD-10-CM

## 2023-02-09 DIAGNOSIS — E872 Acidosis, unspecified: Secondary | ICD-10-CM

## 2023-02-09 DIAGNOSIS — I25118 Atherosclerotic heart disease of native coronary artery with other forms of angina pectoris: Secondary | ICD-10-CM | POA: Diagnosis not present

## 2023-02-09 DIAGNOSIS — R935 Abnormal findings on diagnostic imaging of other abdominal regions, including retroperitoneum: Secondary | ICD-10-CM

## 2023-02-09 DIAGNOSIS — I10 Essential (primary) hypertension: Secondary | ICD-10-CM

## 2023-02-09 HISTORY — DX: Acidosis, unspecified: E87.20

## 2023-02-09 HISTORY — DX: Diverticulitis of large intestine without perforation or abscess without bleeding: K57.32

## 2023-02-09 LAB — CBC
HCT: 31.6 % — ABNORMAL LOW (ref 36.0–46.0)
HCT: 33.1 % — ABNORMAL LOW (ref 36.0–46.0)
Hemoglobin: 10.6 g/dL — ABNORMAL LOW (ref 12.0–15.0)
Hemoglobin: 11.2 g/dL — ABNORMAL LOW (ref 12.0–15.0)
MCH: 30.1 pg (ref 26.0–34.0)
MCH: 29.6 pg (ref 26.0–34.0)
MCHC: 33.5 g/dL (ref 30.0–36.0)
MCHC: 33.8 g/dL (ref 30.0–36.0)
MCV: 89.8 fL (ref 80.0–100.0)
MCV: 87.6 fL (ref 80.0–100.0)
Platelets: 332 10*3/uL (ref 150–400)
Platelets: 339 10*3/uL (ref 150–400)
RBC: 3.52 MIL/uL — ABNORMAL LOW (ref 3.87–5.11)
RBC: 3.78 MIL/uL — ABNORMAL LOW (ref 3.87–5.11)
RDW: 13.2 % (ref 11.5–15.5)
RDW: 13 % (ref 11.5–15.5)
WBC: 21.1 10*3/uL — ABNORMAL HIGH (ref 4.0–10.5)
WBC: 20.9 10*3/uL — ABNORMAL HIGH (ref 4.0–10.5)
nRBC: 0 % (ref 0.0–0.2)
nRBC: 0 % (ref 0.0–0.2)

## 2023-02-09 LAB — LACTIC ACID, PLASMA
Lactic Acid, Venous: 3.8 mmol/L (ref 0.5–1.9)
Lactic Acid, Venous: 2.5 mmol/L (ref 0.5–1.9)

## 2023-02-09 LAB — BASIC METABOLIC PANEL
Anion gap: 9 (ref 5–15)
BUN: 20 mg/dL (ref 8–23)
CO2: 22 mmol/L (ref 22–32)
Calcium: 8.3 mg/dL — ABNORMAL LOW (ref 8.9–10.3)
Chloride: 108 mmol/L (ref 98–111)
Creatinine, Ser: 0.79 mg/dL (ref 0.44–1.00)
GFR, Estimated: >60 mL/min (ref >60)
Glucose, Bld: 148 mg/dL — ABNORMAL HIGH (ref 70–99)
Potassium: 4.5 mmol/L (ref 3.5–5.1)
Sodium: 139 mmol/L (ref 135–145)

## 2023-02-09 LAB — CBG MONITORING, ED: Glucose-Capillary: 143 mg/dL — ABNORMAL HIGH (ref 70–99)

## 2023-02-09 LAB — HEMOGLOBIN
Hemoglobin: 10.4 g/dL — ABNORMAL LOW (ref 12.0–15.0)
Hemoglobin: 11 g/dL — ABNORMAL LOW (ref 12.0–15.0)

## 2023-02-09 LAB — HEMOGLOBIN A1C
Hgb A1c MFr Bld: 6.2 % — ABNORMAL HIGH (ref 4.8–5.6)
Mean Plasma Glucose: 131.24 mg/dL

## 2023-02-09 LAB — GLUCOSE, CAPILLARY: Glucose-Capillary: 138 mg/dL — ABNORMAL HIGH (ref 70–99)

## 2023-02-09 MED ORDER — FE FUM-VIT C-VIT B12-FA 460-60-0.01-1 MG PO CAPS
1.0000 | ORAL_CAPSULE | Freq: Two times a day (BID) | ORAL | Status: DC
  Administered 2023-02-09 – 2023-02-14 (×11): 1 via ORAL
  Filled 2023-02-09 (×12): qty 1

## 2023-02-09 MED ORDER — TECHNETIUM TC 99M-LABELED RED BLOOD CELLS IV KIT
20.0000 | PACK | Freq: Once | INTRAVENOUS | Status: AC | PRN
Start: 2023-02-09 — End: 2023-02-09
  Administered 2023-02-09: 18.95 via INTRAVENOUS

## 2023-02-09 MED ORDER — LACTATED RINGERS IV SOLN: INTRAVENOUS | Status: AC

## 2023-02-09 NOTE — ED Notes (Signed)
Entered room to assess patient. Patient is resting quietly in bed, endorses to this RN that she is extremely tired and has been unable to rest due to IV sticks. This RN sympathized with patient and informed of plan of care for repeat labs at 0600 with explanation as to why these lab draws are currently close together. Patient stated understanding. VSS, CCM in use, call light within reach. No other needs identified at this time.

## 2023-02-09 NOTE — Assessment & Plan Note (Signed)
Worsening lactic acidosis, likely due to GI bleed and diverticulitis. -Giving some gentle IV fluid -Monitor lactic acid

## 2023-02-09 NOTE — Consult Note (Signed)
Cardiology Consultation   Patient ID: [name] MRN: [id number]; DOB: [birth year]  Admit date: 02/08/2023 Date of Consult: 02/09/2023  PCP:  Sherlene Shams, MD   [facility] Providers Cardiologist:  Lorine Bears, MD       Patient Profile:   [name] is a 78 y.o. 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 Nelson Chimes.  History of Present Illness:   [name] 11/20/2022 showed diffuse and moderately calcified arteries with severe two vessel CAD. Successful bifurcation angioplasty and kissing stent placement to OM1/mid left circumflex using 2 DES. Patient started on DAPT for at least 12 months with ASA and Brilinta. Echo showed EF 35-40%, mild hypokinesis of the inferoseptal inferior wall and moderate hypokinesis of the entire anterolateral, inferolateral, and anterior walls with normal RV SF and RVSP. 12/11/2022 with staged PCI of the RCA with widely patent left circumflex and mildly elevated LVEDP.   Patient was most recently seen in our office 02/02/2023 by Cadence Furth for routine follow up. She was doing well aside from hip and back pain. Complained that Brilinta caused shortness of breath. No medication changes were made.   She reports that yesterday she had 3 bowel movements with a large amount of bright red blood in each. During the third occurrence she started feeling lightheaded and told her husband she felt as though she was going to pass out. Her husband tells me she looked pale and clammy. He called EMS who found the patient unconscious and brought her to the emergency department. She tells me that prior to this she was doing well, compliant with medications , and without cardiac symptoms. She  did note a nose bleed this week which is not normal for her. She denies chest pain, shortness of breath, palpitations, nausea, fever, chills, abdominal pain, and vomiting. She has not had any further hematochezia since arrival to the ED and is no longer lightheaded.   In the ED, BP 138/78, HR 85, RR 18, T 97.9 F, SpO2 100%. CBC with WBC 20.2, hgb 11.7 and hct 34.4. BMP with Cr 1.02 and glucose 231. EKG without ischemic changes. CT angio with evidence of acute diverticulitis, no active GI bleed. Started on Zosyn. Brilinta held.    Past Medical History:  Diagnosis Date   Asthma    Asthma due to environmental allergies    grass, mold, trees, dust   Cataract    Cellulitis    LEFT LEG   Diabetes mellitus    CONTROLLED ON DIET ALONE   Edema leg    LEFT LEG...CHRONIC   Endometrial hyperplasia    External hemorrhoid    Fatty liver    Glaucoma    Hepatic cyst    STABLE PER 05/20/2008 ULTRASOUND   Hypertension    borderline...controlled since taking herself of HCTZ IN JULY   IBS (irritable bowel syndrome)    Murmur, cardiac    Neuropathy    OSA on CPAP    Osteoarthritis    Psoriasis    Sleep apnea    CPAP    Past Surgical History:  Procedure Laterality Date   BONE DENSITY TEST  01/17/2004   CATARACT EXTRACTION, BILATERAL Bilateral    May 2023 and June 2023  CORONARY STENT INTERVENTION N/A 11/20/2022   Procedure: CORONARY STENT INTERVENTION;  Surgeon: Iran Ouch, MD;  Location: [facility] IN[facility]IVE [facility];  Service: Cardiovascular;  Laterality: N/A;   CORONARY STENT INTERVENTION Right 12/11/2022   Procedure: CORONARY STENT INTERVENTION;  Surgeon: Iran Ouch, MD;  Location: [facility];  Service: Cardiovascular;  Laterality: Right;   HYSTEROSCOPY WITH D & C  01/16/2005   BC OF ABNORAL PAP AND ENDOMETRIAL HYPERPLASIA   LEFT HEART CATH AND CORONARY ANGIOGRAPHY N/A 11/20/2022   Procedure: LEFT HEART CATH AND CORONARY ANGIOGRAPHY;  Surgeon: Iran Ouch, MD;   Location: [facility] CV [facility];  Service: Cardiovascular;  Laterality: N/A;   LEFT HEART CATH AND CORONARY ANGIOGRAPHY N/A 12/11/2022   Procedure: LEFT HEART CATH AND CORONARY ANGIOGRAPHY;  Surgeon: Iran Ouch, MD;  Location: [facility] INV[facility] CV [facility];  Service: Cardiovascular;  Laterality: N/A;   ONE VAGINAL DELIVERY     UMBILICAL HERNIA REPAIR  01/17/2008   VAGINAL HYSTERECTOMY  01/17/2008       Inpatient Medications: Scheduled Meds:  aspirin EC  81 mg Oral Daily   carvedilol  3.125 mg Oral BID WC   gabapentin  200 mg Oral TID   insulin aspart  0-5 Units Subcutaneous QHS   insulin aspart  0-9 Units Subcutaneous TID WC   latanoprost  1 drop Both Eyes QHS   sodium chloride flush  3 mL Intravenous Q12H   Continuous Infusions:  lactated ringers 100 mL/hr at 02/08/23 2316   piperacillin-tazobactam (ZOSYN)  IV 3.375 g (02/09/23 0612)   PRN Meds: acetaminophen **OR** acetaminophen, ondansetron **OR** ondansetron (ZOFRAN) IV, polyethylene glycol  Allergies:    Allergies  Allergen Reactions   Meloxicam Other (See Comments)    Hypertension, tachycardia   Statins Other (See Comments)    "Muscle weakness"   Sulfa Drugs Cross Reactors    Zostavax [employer] Vaccine Live]     Bad reaction     Social History:   Social History   Socioeconomic History   Marital status: Married    Spouse name: Not on file   Number of children: 2   Years of education: Not on file   Highest education level: Some college, no degree  Occupational History   Occupation: RETIRED  Tobacco Use   Smoking status: Former    Current packs/day: 0.00    Types: Cigarettes    Quit date: 01/16/1978    Years since quitting: 45.0   Smokeless tobacco: Never   Tobacco comments:    REMOTELY QUIT IN 1987 AFTER A FEW YEARS OF USE  Vaping Use   Vaping status: Never Used  Substance and Sexual Activity   Alcohol use: No   Drug use: No   Sexual activity: Not on file  Other Topics Concern   Not on file  Social  History Narrative   Lives with husband.   Social Drivers of Corporate investment banker Strain: Low Risk  (02/01/2023)   Received from [facility]   Overall Financial Resource Strain (CARDIA)    Difficulty of Paying Living Expenses: Not hard at all  Food Insecurity: No Food Insecurity (02/01/2023)   Received from [facility]   Hunger Vital Sign    Worried About Running Out of Food in the Last Year: Never true    Ran Out of Food in the Last Year: Never true  Transportation Needs: No Transportation Needs (02/01/2023)   Received from [facility]  PRAPARE - Transportation    In the past 12 months, has lack of transportation kept you from medical appointments or from getting medications?: No    Lack of Transportation (Non-Medical): No  Recent Concern: Transportation Needs - Unmet Transportation Needs (11/21/2022)   PRAPARE - Transportation    Lack of Transportation (Medical): Yes    Lack of Transportation (Non-Medical): Yes  Physical Activity: Unknown (07/30/2018)   Exercise Vital Sign    Days of Exercise per Week: 0 days    Minutes of Exercise per Session: Not on file  Stress: No Stress Concern Present (07/30/2018)   [employer] of Occupational Health - Occupational Stress Questionnaire    Feeling of Stress : Not at all  Social Connections: Not on file  Intimate Partner Violence: Not At Risk (11/18/2022)   Humiliation, Afraid, Rape, and Kick questionnaire    Fear of Current or Ex-Partner: No    Emotionally Abused: No    Physically Abused: No    Sexually Abused: No    Family History:    Family History  Problem Relation Age of Onset   Mental illness Mother        DEMENTIA   Heart disease Father 3       MI.Marland KitchenMarland KitchenS/D AVR/CABG x3   Cancer Neg Hx        no ovarian colon breast   Breast cancer Neg Hx      ROS:  Please see the history of present illness.   Physical Exam/Data:   Vitals:   02/09/23 0349 02/09/23 0400  02/09/23 0500 02/09/23 0600  BP:  134/76 122/78 125/71  Pulse: 78 90 66 72  Resp: 18 20 16 15   Temp: 97.7 F (36.5 C)     TempSrc: Axillary     SpO2: 100% 100% 97% 99%  Weight:      Height:       No intake or output data in the 24 hours ending 02/09/23 0748    02/08/2023    1:33 PM 02/02/2023    1:21 PM 01/11/2023    2:25 PM  Last 3 Weights  Weight (lbs) 194 lb 0.1 oz 194 lb 3.2 oz 196 lb 12.8 oz  Weight (kg) 88 kg 88.089 kg 89.268 kg     Body mass index is 31.31 kg/m.  General:  Well nourished, well developed, in no acute distress HEENT: normal Neck: no JVD Cardiac:  normal S1, S2; RRR; no murmur  Lungs:  clear to auscultation bilaterally, no wheezing, rhonchi or rales  Abd: soft, tender to deep palpation of right lower quadrant Ext: no edema Skin: warm and dry  Psych:  Normal affect   EKG:  The EKG was personally reviewed and demonstrates:  Sinus rhythm with LAD, PVC, rate 88 bpm Telemetry:  Telemetry was personally reviewed and demonstrates:  Sinus rhythm with multifocal PVCs, rate 70-100 bpm  Relevant CV Studies:  Echo limited 01/2023 1. Left ventricular ejection fraction, by estimation, is 45 to 50%. The  left ventricle has moderately decreased function. The left ventricle  demonstrates global hypokinesis. Left ventricular diastolic parameters are  consistent with Grade I diastolic  dysfunction (impaired relaxation).   2. Right ventricular systolic function is normal. The right ventricular  size is normal.   3. The mitral valve is normal in structure. Mild mitral valve  regurgitation. No evidence of mitral stenosis.   4. The aortic valve is calcified. There is moderate calcification of the  aortic valve. Aortic valve regurgitation is not visualized. Mild aortic  valve stenosis. Aortic valve mean gradient measures 13.0 mmHg.   5. The inferior vena cava is normal in size with greater than 50%  respiratory variability, suggesting right atrial pressure of 3 mmHg.     LHC staged PCI 12/11/22   Ost LM lesion is 30% stenosed.   Prox LAD to Mid LAD lesion is 30% stenosed.   Mid LAD lesion is 40% stenosed.   Mid LAD to Dist LAD lesion is 30% stenosed.   Prox RCA to Mid RCA lesion is 30% stenosed.   Dist RCA lesion is 70% stenosed with 90% stenosed side branch in RPDA.   1st Diag lesion is 50% stenosed.   Ost RCA to Prox RCA lesion is 50% stenosed.   RPAV lesion is 40% stenosed.   Non-stenotic Prox Cx to Mid Cx lesion was previously treated.   Non-stenotic 1st Mrg lesion was previously treated.   A drug-eluting stent was successfully placed using a STENT ONYX FRONTIER 2.5X22.   Post intervention, there is a 0% residual stenosis.   Post intervention, the side branch was reduced to 0% residual stenosis. 1.  Widely patent left circumflex stent with no significant restenosis.  Severe distal RCA stenosis extending into the right PDA.  Moderate ostial RCA stenosis with pressure dampening with guide catheter engagement.  However, there was no pressure dampening with diagnostic catheter engagement during most recent angiography. 2.  Left ventricular angiography was not performed.  Mildly elevated left ventricular end-diastolic pressure at 18 mmHg. 3.  Successful angioplasty and drug-eluting stent placement to the distal right coronary artery extending into the right PDA.   LHC 11/20/23   Prox RCA lesion is 30% stenosed.   Dist RCA lesion is 70% stenosed with 90% stenosed side branch in RPDA.   Prox LAD to Mid LAD lesion is 30% stenosed.   1st Diag lesion is 50% stenosed.   Mid LAD lesion is 40% stenosed.   Mid LAD to Dist LAD lesion is 30% stenosed.   Ost LM lesion is 30% stenosed.   1st Mrg lesion is 99% stenosed.   Prox Cx to Mid Cx lesion is 90% stenosed.   A drug-eluting stent was successfully placed using a STENT ONYX FRONTIER 2.5X26.   A drug-eluting stent was successfully placed using a STENT ONYX FRONTIER 2.5X22.   Post intervention, there is a 0%  residual stenosis.   Post intervention, there is a 0% residual stenosis. 1.  Diffuse and moderately calcified coronary arteries with severe two-vessel coronary artery disease.  The culprit for myocardial infarction seems to be plaque rupture and OM1 and mid left circumflex.  In addition, there is severe stenosis in the distal RCA extending into the ostium of the right PDA.  The LAD has moderate disease in multiple areas. 2.  Left ventricular angiography was not performed.  EF was moderately reduced by echo.  LVEDP was mildly elevated. 3.  Successful complex bifurcation angioplasty and kissing stent placement to OM1/mid left circumflex using 2 drug-eluting stents.  Laboratory Data:  High Sensitivity Troponin:  No results for input(s): "TROPONINIHS" in the last 720 hours.   Chemistry Recent Labs  Lab 02/08/23 1332 02/09/23 0607  NA 136 139  K 4.2 4.5  CL 104 108  CO2 20* 22  GLUCOSE 231* 148*  BUN 22 20  CREATININE 1.02* 0.79  CALCIUM 8.5* 8.3*  GFRNONAA 56* >60  ANIONGAP 12 9    No results for input(s): "PROT", "ALBUMIN", "AST", "ALT", "ALKPHOS", "BILITOT" in the last 168 hours.  Lipids No results for input(s): "CHOL", "TRIG", "HDL", "LABVLDL", "LDLCALC", "CHOLHDL" in the last 168 hours.  Hematology Recent Labs  Lab 02/08/23 1332 02/08/23 2057 02/09/23 0252 02/09/23 0607  WBC 20.2*  --   --  21.1*  RBC 3.91  --   --  3.52*  HGB 11.7* 11.3* 10.4* 10.6*  HCT 34.4*  --   --  31.6*  MCV 88.0  --   --  89.8  MCH 29.9  --   --  30.1  MCHC 34.0  --   --  33.5  RDW 12.6  --   --  13.2  PLT 352  --   --  332   Thyroid No results for input(s): "TSH", "FREET4" in the last 168 hours.  BNP Recent Labs  Lab 02/08/23 1336  BNP 150.3*    DDimer No results for input(s): "DDIMER" in the last 168 hours.  Radiology/Studies:  CT ANGIO GI BLEED Result Date: 02/08/2023 IMPRESSION: VASCULAR 1. Atherosclerotic disease in the abdominal aorta without aneurysm. Aortic Atherosclerosis  (ICD10-I70.0). 2. Narrowing of the proximal celiac artery likely related to median arcuate ligament compression. NON-VASCULAR 1. Acute inflammation involving the right colon near the hepatic flexure. Findings are most compatible with acute diverticulitis. 2. No evidence for active GI bleeding. 3. Small periumbilical hernia containing fat. 4. Small right adrenal adenoma. 5. Small hiatal hernia. Electronically Signed   By: Richarda Overlie M.D.   On: 02/08/2023 15:48   Assessment and Plan:   GI bleed Diverticulitis - Presented 1/23 with 1 day of BRBPR x3 with associated syncope - Patient was on DAPT with Brilinta and ASA for recent DES placement x3 in 11/2022 - Hgb 11.7 on presentation. Progressive decline with hgb now 10.6 - CTA abdomen with evidence of diverticulitis and no evidence of active GI bleed, WBC 21.1 - No hematochezia since admission. Patient largely asymptomatic at this time.  - Brilinta held due to worsening hgb, continue ASA. Would prefer to minimize time patient is off Brilinta as stents were placed just 2 months ago.  - Antibiotic management per IM  CAD s/p PCI/DES  - LHC 11/20/2022 showed diffuse and moderately calcified arteries with severe two vessel CAD. Successful bifurcation angioplasty and kissing stent placement to OM1/mid left circumflex using 2 DES. 12/11/2022 staged PCI of the RCA with widely patent left circumflex and mildly elevated LVEDP. Recommended DAPT with ASA and Brilinta for 12 months.  - Brilinta held at this time as above - Continue ASA 81 mg and carvedilol 3.125 mg BID  For questions or updates, please contact [facility] Please consult [email or web address] for contact info under    Signed, Orion Crook, PA-C  02/09/2023 7:48 AM

## 2023-02-09 NOTE — Progress Notes (Signed)
Progress Note   Patient: [name] DOB: January 09, 1946 DOA: 02/08/2023     1 DOS: the patient was seen and examined on 02/09/2023   Brief hospital course:  [name] is a 78 y.o. 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   Patient had total of 3 episodes of bright red blood per rectum, she was on aspirin and Brilinta due to recent PCI done in November 2024.  On presentation hemodynamically stable, labs with leukocytosis at 20.2, hemoglobin 11.7, decreased from 14.3  10-month ago, CO2 of 20, blood glucose 231, BUN 22, creatinine 1.02. CT angio abdomen for GI protocol with concern of acute inflammation involving the right colon near hepatic flexure, most compatible with acute diverticulitis.  No evidence of active GI bleeding.  Also noted a small right adrenal adenoma and a small hiatal hernia.   Patient was started on Zosyn.  GI and cardiology are both consulted.  Cardiology recommended continue with aspirin and hold Brilinta for now.  Patient is high risk for restenosis of stents.  Gastroenterology recommended RBC tagged scan if bleeding persists.  1/24: Hemodynamically stable, hemoglobin at 10.6, slight worsening of leukocytosis at 21.1.  BNP of 150, lactic acid 3 >>3.8 Had another episode of small amount of bleeding per rectum, RBC tagged scan ordered.  Also giving some more gentle IV fluid    Assessment and Plan: * GI bleed Diverticulitis.  CT abdomen with concern of diverticulitis, no active bleeding.  Patient was on DAPT with aspirin and Brilinta due to placement of 3 recent cardiac stents. Current hemoglobin of 10, it was 14.32 months ago. GI was consulted-recommending tagged RBC scan for further bleeding which was ordered After discussing with cardiology we will continue  with aspirin and hold Brilinta for now. -Continue with Zosyn -Monitor hemoglobin -Transfuse if below 8  Coronary artery disease Patient has 3 recent stents placement, 2 on 11/4 and 1 on 11/2522 by cardiology and was started on aspirin and Brilinta. Tough situation as she will be high risk for restenosis. Discussed with cardiology and they are advising continue aspirin for now and hold Brilinta which might be switched to Plavix in a day or 2 -Continue home carvedilol -Continue with aspirin -Holding Brilinta  Lactic acidosis Worsening lactic acidosis, likely due to GI bleed and diverticulitis. -Giving some gentle IV fluid -Monitor lactic acid  Diabetes mellitus (HCC) Check A1c -SSI  Chronic HFrEF (heart failure with reduced ejection fraction) (HCC) Echocardiogram done on 01/31/2023 with EF of 45 to 50% and grade 1 diastolic dysfunction. -Continue with carvedilol -Monitor volume status   Essential hypertension Pressure currently within goal. -Holding home losartan for concern of GI bleed  OSA on CPAP -Continue CPAP at night   Subjective: Patient had 1 more episode of small bleeding per rectum this afternoon.  Denies any pain.  Physical Exam: Vitals:   02/09/23 1454 02/09/23 1504 02/09/23 1518 02/09/23 1543  BP: (!) 143/84 (!) 155/75 135/65 (!) 144/73  Pulse:      Resp:      Temp:      TempSrc:      SpO2:      Weight:      Height:       General.  Overweight lady, in no acute distress. Pulmonary.  Lungs clear bilaterally, normal respiratory effort. CV.  Regular rate and rhythm, no JVD, rub or murmur. Abdomen.  Soft, nontender, nondistended, BS positive. CNS.  Alert and oriented .  No focal neurologic deficit. Extremities.  No edema, no cyanosis, pulses intact and symmetrical. Psychiatry.  Judgment and insight appears normal.   Data Reviewed: Prior data reviewed  Family Communication: Discussed with husband at bedside  Disposition: Status is:  Inpatient Remains inpatient appropriate because: Severity of illness  Planned Discharge Destination: Home  Time spent: 50 minutes  This record has been created using Conservation officer, historic buildings. Errors have been sought and corrected,but may not always be located. Such creation errors do not reflect on the standard of care.   Author: Arnetha Courser, MD 02/09/2023 3:57 PM  For on call review [email or web address].

## 2023-02-09 NOTE — Plan of Care (Signed)
  Problem: Education: Goal: Ability to describe self-care measures that may prevent or decrease complications (Diabetes Survival Skills Education) will improve Outcome: Progressing Goal: Individualized Educational Video(s) Outcome: Progressing   Problem: Coping: Goal: Ability to adjust to condition or change in health will improve Outcome: Progressing   Problem: Fluid Volume: Goal: Ability to maintain a balanced intake and output will improve Outcome: Progressing   Problem: Health Behavior/Discharge Planning: Goal: Ability to identify and utilize available resources and services will improve Outcome: Progressing Goal: Ability to manage health-related needs will improve Outcome: Progressing   Problem: Nutritional: Goal: Maintenance of adequate nutrition will improve Outcome: Progressing Goal: Progress toward achieving an optimal weight will improve Outcome: Progressing   Problem: Skin Integrity: Goal: Risk for impaired skin integrity will decrease Outcome: Progressing   Problem: Tissue Perfusion: Goal: Adequacy of tissue perfusion will improve Outcome: Progressing   Problem: Clinical Measurements: Goal: Ability to maintain clinical measurements within normal limits will improve Outcome: Progressing Goal: Will remain free from infection Outcome: Progressing Goal: Diagnostic test results will improve Outcome: Progressing Goal: Respiratory complications will improve Outcome: Progressing Goal: Cardiovascular complication will be avoided Outcome: Progressing

## 2023-02-09 NOTE — Hospital Course (Addendum)
[name] is a 78 y.o. 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   Patient had total of 3 episodes of bright red blood per rectum, she was on aspirin and Brilinta due to recent PCI done in November 2024.  On presentation hemodynamically stable, labs with leukocytosis at 20.2, hemoglobin 11.7, decreased from 14.3  19-month ago, CO2 of 20, blood glucose 231, BUN 22, creatinine 1.02. CT angio abdomen for GI protocol with concern of acute inflammation involving the right colon near hepatic flexure, most compatible with acute diverticulitis.  No evidence of active GI bleeding.  Also noted a small right adrenal adenoma and a small hiatal hernia.   Patient was started on Zosyn.  GI and cardiology are both consulted.  Cardiology recommended continue with aspirin and hold Brilinta for now.  Patient is high risk for restenosis of stents.  Gastroenterology recommended RBC tagged scan if bleeding persists.  1/24: Hemodynamically stable, hemoglobin at 10.6, slight worsening of leukocytosis at 21.1.  BNP of 150, lactic acid 3 >>3.8 Had another episode of small amount of bleeding per rectum, RBC tagged scan ordered.  Also giving some more gentle IV fluid  1/25: Patient continued to have bleeding per rectum, overnight 5 episodes recorded.  Hemoglobin decreased to 9.1>>8.4 today.  Worsening lactic acidosis after some improvement.  Continuing aspirin per cardiology and keep holding Brilinta.  RBC tagged scan was negative. Ordered 1 unit of PRBC.   1/26: Continue to bleed, hemoglobin was 7.9 this morning after getting 1 unit yesterday.  Patient was sent for another RBC tagged scan, towards the end of the scan she felt little chest pressure and at the end she suddenly became hypotensive, cold and clammy, rapid response  was called.  Patient was given emergent blood transfusion and 1 L of bolus and transferred to ICU.  RBC tagged scan came back positive for active bleeding just below hepatic flexure, IR is going to do embolization as she continued to have significant bleeding per rectum. Emergency protocol blood transfusion and FFP ordered. Blood pressure responding to IV fluid and transfusions. Ordered troponin, cardiology and GI are also aware.  PCCM on board  1/27: 1 episode of small amount of bleeding overnight and 1 dark-colored stool during the day.  Patient had her mesenteric angiography with IR yesterday evening, there was no active bleeding at that time, no embolization was done.  Patient with hemoglobin of 8.4 this morning s/p total of 4 unit of PRBC and 2 units of FFP. Cardiology is planning to do Brilinta monotherapy once bleeding resolved.  They also requested genotype testing for clopidogrel with the plan to transition her from ticagrelor to clopidogrel once results are back and if she is response.  Losing most of her IV access and a very hard stick, eventually agreed to place a PICC line which was ordered.  1/28: Continue to have some dark-colored stool but no other active bleeding, hemoglobin at 7.3 this morning-ordered 2 more units of PRBC. Plan is to do 2 weeks of antibiotics for diverticulitis due to this persistent bleeding, we can switch to p.o. once bleeding resolved.

## 2023-02-09 NOTE — Consult Note (Signed)
[name], MD [facility]  [address], [address] [place], [place] [address] Phone: [phone] Fax : [phone]  Consultation  Referring Provider:     Dr. Nelson Chimes Primary Care Physician:  Sherlene Shams, MD Primary Gastroenterologist: Gentry Fitz         Reason for Consultation:     Lower GI bleeding  Date of Admission:  02/08/2023 Date of Consultation:  02/09/2023         HPI:   [name] is a 78 y.o. female who reports 3 episodes of rectal bleeding yesterday.  The patient states that she has never had rectal bleeding in the past.  The patient also had a colonoscopy many years ago in [place] but has been using Cologuard for her colon cancer screening.  The patient denies any abdominal pain but started to have bright red blood per rectum yesterday.  She has had no further sign of any GI bleeding.  The patient is on aspirin and Brilinta due to having a cardiac stent placed back in November.  She denies ever having any rectal bleeding the past.  The patient had a CT angiography done yesterday that did not show any signs of active bleeding but did show acute inflammation involving the right colon near the hepatic flexure most consistent with acute diverticulitis.  She was also noted to have a white cell count last month at 8.5 but came in with a white cell count of 20.2.  Her hemoglobin a month ago was 14.3 and was 11.7 on admission and is down to 10.6 today.  She reports her last bowel movement was in the ambulance and states that it was a smear of a small amount of blood at that time and that was yesterday prior to admission.  I was notified yesterday evening on admission and so was cardiology who has be dealing with her anticoagulation.  The patient also reported that she was having lightheadedness and dizziness and felt clammy therefore that is why she called EMS.  Patient was also started on Zosyn for her diverticulitis.   Past Medical History:  Diagnosis Date   Asthma    Asthma due  to environmental allergies    grass, mold, trees, dust   Cataract    Cellulitis    LEFT LEG   Diabetes mellitus    CONTROLLED ON DIET ALONE   Edema leg    LEFT LEG...CHRONIC   Endometrial hyperplasia    External hemorrhoid    Fatty liver    Glaucoma    Hepatic cyst    STABLE PER 05/20/2008 ULTRASOUND   Hypertension    borderline...controlled since taking herself of HCTZ IN JULY   IBS (irritable bowel syndrome)    Murmur, cardiac    Neuropathy    OSA on CPAP    Osteoarthritis    Psoriasis    Sleep apnea    CPAP    Past Surgical History:  Procedure Laterality Date   BONE DENSITY TEST  01/17/2004   CATARACT EXTRACTION, BILATERAL Bilateral    May 2023 and June 2023   CORONARY STENT INTERVENTION N/A 11/20/2022   Procedure: CORONARY STENT INTERVENTION;  Surgeon: Iran Ouch, MD;  Location: [facility] INVASIVE CV LAB;  Service: Cardiovascular;  Laterality: N/A;   CORONARY STENT INTERVENTION Right 12/11/2022   Procedure: CORONARY STENT INTERVENTION;  Surgeon: Iran Ouch, MD;  Location: [facility] INVASIVE CV LAB;  Service: Cardiovascular;  Laterality: Right;   HYSTEROSCOPY WITH D & C  01/16/2005   BC OF ABNORAL PAP AND ENDOMETRIAL HYPERPLASIA   LEFT HEART CATH AND CORONARY ANGIOGRAPHY N/A 11/20/2022   Procedure: LEFT HEART CATH AND CORONARY ANGIOGRAPHY;  Surgeon: Iran Ouch, MD;  Location: [facility] INVASIVE CV LAB;  Service: Cardiovascular;  Laterality: N/A;   LEFT HEART CATH AND CORONARY ANGIOGRAPHY N/A 12/11/2022   Procedure: LEFT HEART CATH AND CORONARY ANGIOGRAPHY;  Surgeon: Iran Ouch, MD;  Location: [facility] INVASIVE CV LAB;  Service: Cardiovascular;  Laterality: N/A;   ONE VAGINAL DELIVERY     UMBILICAL HERNIA REPAIR  01/17/2008   VAGINAL HYSTERECTOMY  01/17/2008    Prior to Admission medications   Medication Sig Start Date End Date Taking? Authorizing Provider  albuterol (VENTOLIN HFA) 108 (90 Base) MCG/ACT inhaler Inhale 2 puffs into the lungs every 6 (six)  hours as needed for wheezing or shortness of breath.   Yes [provider]  ALPRAZolam (XANAX) 0.25 MG tablet Take 1 tablet (0.25 mg total) by mouth 2 (two) times daily as needed for anxiety. 11/17/22  Yes Sherlene Shams, MD  aspirin EC 81 MG tablet Take 1 tablet (81 mg total) by mouth daily. Swallow whole. 11/22/22  Yes Alexander, Dorene Grebe, DO  bimatoprost (LUMIGAN) 0.01 % SOLN Place 1 drop into both eyes at bedtime. 12/19/22  Yes   carvedilol (COREG) 3.125 MG tablet Take 1 tablet (3.125 mg total) by mouth 2 (two) times daily with a meal. 11/30/22  Yes Furth, Cadence H, PA-C  cetirizine (ZYRTEC) 10 MG tablet Take 10 mg by mouth daily.   Yes [provider]  EPINEPHrine 0.3 mg/0.3 mL IJ SOAJ injection as needed. 07/23/18  Yes [provider]  gabapentin (NEURONTIN) 100 MG capsule Take 1 capsule (100 mg total) by mouth 3 (three) times daily. If no improvement after 4 or 5 days increase to 2 capsules 3 times a day 01/29/23  Yes Tera Partridge, PA  losartan (COZAAR) 25 MG tablet Take 0.5 tablets (12.5 mg total) by mouth daily. 11/30/22  Yes Furth, Cadence H, PA-C  metoprolol succinate (TOPROL-XL) 25 MG 24 hr tablet Take 25 mg by mouth at bedtime. 11/17/22  Yes [provider]  MILK THISTLE PO Take 1 capsule by mouth daily.    Yes [provider]  nitroGLYCERIN (NITROSTAT) 0.4 MG SL tablet Place 1 tablet (0.4 mg total) under the tongue every 5 (five) minutes for up to 3 doses as needed for chest pain. 11/21/22  Yes Sunnie Nielsen, DO  Probiotic Product (PROBIOTIC COMPLEX ACIDOPHILUS PO) Take 1 capsule by mouth daily.   Yes [provider]  sodium chloride (OCEAN) 0.65 % SOLN nasal spray Place 1 spray into the nose as needed.   Yes [provider]  ticagrelor (BRILINTA) 90 MG TABS tablet Take 1 tablet (90 mg total) by mouth 2 (two) times daily. 11/30/22  Yes Furth, Cadence H, PA-C  tildrakizumab-asmn (ILUMYA) 100 MG/ML subcutaneous injection Inject  100 mg into the skin every 3 (three) months.   Yes [provider]  budesonide (PULMICORT) 180 MCG/ACT inhaler Inhale 2 puffs into the lungs 2 (two) times daily. Patient not taking: Reported on 02/08/2023    [provider]  ondansetron (ZOFRAN) 4 MG tablet Take 1 tablet (4 mg total) by mouth every 8 (eight) hours as needed for nausea or vomiting. Patient not taking: Reported on 02/08/2023 09/21/21   Sherlene Shams, MD    Family History  Problem Relation Age of Onset   Mental illness Mother  DEMENTIA   Heart disease Father 75       MI.Marland KitchenMarland KitchenS/D AVR/CABG x3   Cancer Neg Hx        no ovarian colon breast   Breast cancer Neg Hx      Social History   Tobacco Use   Smoking status: Former    Current packs/day: 0.00    Types: Cigarettes    Quit date: 01/16/1978    Years since quitting: 45.0   Smokeless tobacco: Never   Tobacco comments:    REMOTELY QUIT IN 1987 AFTER A FEW YEARS OF USE  Vaping Use   Vaping status: Never Used  Substance Use Topics   Alcohol use: No   Drug use: No    Allergies as of 02/08/2023 - Review Complete 02/08/2023  Allergen Reaction Noted   Meloxicam Other (See Comments) 10/13/2019   Statins Other (See Comments) 11/18/2022   Sulfa drugs cross reactors  09/05/2010   Zostavax [zoster vaccine live]  09/21/2021    Review of Systems:    All systems reviewed and negative except where noted in HPI.   Physical Exam:  Vital signs in last 24 hours: Temp:  [97.7 F (36.5 C)-97.9 F (36.6 C)] 97.7 F (36.5 C) (01/24 0349) Pulse Rate:  [63-93] 79 (01/24 0800) Resp:  [10-23] 12 (01/24 0800) BP: (120-157)/(53-105) 157/66 (01/24 0800) SpO2:  [97 %-100 %] 98 % (01/24 0800) Weight:  [88 kg] 88 kg (01/23 1333) Last BM Date : 02/09/23 General:   Pleasant, cooperative in NAD Head:  Normocephalic and atraumatic. Eyes:   No icterus.   Conjunctiva pink. PERRLA. Ears:  Normal auditory acuity. Neck:  Supple; no masses or thyroidomegaly Lungs:  Respirations even and unlabored. Lungs clear to auscultation bilaterally.   No wheezes, crackles, or rhonchi.  Heart:  Regular rate and rhythm;  Without murmur, clicks, rubs or gallops Abdomen:  Soft, nondistended, mild left sided tenderness.. Normal bowel sounds. No appreciable masses or hepatomegaly.  No rebound or guarding.  Rectal:  Not performed. Msk:  Symmetrical without gross deformities.   Extremities:  Without edema, cyanosis or clubbing. Neurologic:  Alert and oriented x3;  grossly normal neurologically. Skin:  Intact without significant lesions or rashes. Cervical Nodes:  No significant cervical adenopathy. Psych:  Alert and cooperative. Normal affect.  LAB RESULTS: Recent Labs    02/08/23 1332 02/08/23 2057 02/09/23 0252 02/09/23 0607  WBC 20.2*  --   --  21.1*  HGB 11.7* 11.3* 10.4* 10.6*  HCT 34.4*  --   --  31.6*  PLT 352  --   --  332   BMET Recent Labs    02/08/23 1332 02/09/23 0607  NA 136 139  K 4.2 4.5  CL 104 108  CO2 20* 22  GLUCOSE 231* 148*  BUN 22 20  CREATININE 1.02* 0.79  CALCIUM 8.5* 8.3*   LFT No results for input(s): "PROT", "ALBUMIN", "AST", "ALT", "ALKPHOS", "BILITOT", "BILIDIR", "IBILI" in the last 72 hours. PT/INR No results for input(s): "LABPROT", "INR" in the last 72 hours.  STUDIES: CT ANGIO GI BLEED Result Date: 02/08/2023 CLINICAL DATA:  GI bleed. EXAM: CTA ABDOMEN AND PELVIS WITHOUT AND WITH CONTRAST TECHNIQUE: Multidetector CT imaging of the abdomen and pelvis was performed using the standard protocol during bolus administration of intravenous contrast. Multiplanar reconstructed images and MIPs were obtained and reviewed to evaluate the vascular anatomy. RADIATION DOSE REDUCTION: This exam was performed according to the departmental dose-optimization program which includes automated exposure control, adjustment of the mA  and/or kV according to patient size and/or use of iterative reconstruction technique. CONTRAST:  OMNIPAQUE  IOHEXOL 350 MG/ML SOLN COMPARISON:  CT abdomen pelvis 05/16/2017 FINDINGS: VASCULAR Aorta: Atherosclerotic disease in the abdominal aorta without aneurysm, dissection or significant stenosis. Celiac: Narrowing of the proximal celiac artery appears to be related to median arcuate ligament compression. SMA: Patent without evidence of aneurysm, dissection, vasculitis or significant stenosis. Renals: Both renal arteries are patent without evidence of aneurysm, dissection, vasculitis, fibromuscular dysplasia or significant stenosis. IMA: Patent without evidence of aneurysm, dissection, vasculitis or significant stenosis. Inflow: Patent without evidence of aneurysm, dissection, vasculitis or significant stenosis. Proximal Outflow: Proximal femoral arteries are patent bilaterally. Veins: Portal venous system is patent. No abnormality to the IVC or iliac veins. Bilateral renal veins are patent. Review of the MIP images confirms the above findings. NON-VASCULAR Lower chest: Lung bases are clear. Hepatobiliary: Small low-density structures in the liver likely represent small hepatic cysts. No biliary dilatation. Normal appearance of the gallbladder. Pancreas: Unremarkable. No pancreatic ductal dilatation or surrounding inflammatory changes. Spleen: Normal in size without focal abnormality. Adrenals/Urinary Tract: Small nodule in the right adrenal gland measures 8 mm and the Hounsfield units are 6 on the noncontrast images. This is suggestive for a small adrenal adenoma. Normal appearance of the left adrenal gland. No hydronephrosis. No suspicious renal lesions. Normal appearance of the urinary bladder. No renal calculi. Stomach/Bowel: No evidence for active GI bleeding. Small hiatal hernia. Normal appearance of the stomach. Normal appearance of the small bowel. Pericolonic inflammation involving the right colon near the hepatic flexure and there are diverticula in this area. Findings are suggestive for acute diverticulitis.  Normal appendix. Lymphatic: No significant lymph node enlargement in the abdomen or pelvis. Reproductive: Status post hysterectomy. No adnexal masses. Other: Periumbilical ventral hernia containing fat. No evidence for free fluid. Negative for free air. Musculoskeletal: No acute bone abnormality. IMPRESSION: VASCULAR 1. Atherosclerotic disease in the abdominal aorta without aneurysm. Aortic Atherosclerosis (ICD10-I70.0). 2. Narrowing of the proximal celiac artery likely related to median arcuate ligament compression. NON-VASCULAR 1. Acute inflammation involving the right colon near the hepatic flexure. Findings are most compatible with acute diverticulitis. 2. No evidence for active GI bleeding. 3. Small periumbilical hernia containing fat. 4. Small right adrenal adenoma. 5. Small hiatal hernia. Electronically Signed   By: Richarda Overlie M.D.   On: 02/08/2023 15:48      Impression / Plan:   Assessment: Principal Problem:   GI bleed Active Problems:   OSA on CPAP   Essential hypertension   CAD S/P percutaneous coronary angioplasty   Chronic HFrEF (heart failure with reduced ejection fraction) (HCC)   Diabetes mellitus (HCC)   Diverticulitis large intestine   [name] is a 78 y.o. y/o female with what appears to be a diverticular bleed but has not had a recent colonoscopy but is on dual antiplatelet therapy for a stent placement.  The patient has had no further sign of bleeding since being admitted.  She does have finding consistent with diverticulitis with a elevated white cell count and a positive CT scan.  Plan:  This patient is not a candidate for any GI intervention at this time with acute diverticulitis on CT scan.  The patient would need to have the diverticulitis resolved prior to having any luminal investigation.  As for the anticoagulation.  The patient is also on high risk of having recurrent bleeding on anticoagulation but it is understandable that the patient needs it due to  the  recent stents placed in her heart. If the patient should have any further GI bleeding a red tagged cell scan versus a repeat CT angiography should be considered and interventional radiology versus vascular surgery for and believes a strain of the bleeding source would be the next step.  As stated the patient would not be a candidate for any control of the bleeding endoluminal he due to her active diverticulitis.  Will follow from afar with you since there is no GI intervention that will solve this patient's present GI bleeding.  I agree with the antibiotics for her diverticulitis.  Further recommendations per cardiology.  Thank you for involving me in the care of this patient.      LOS: 1 day   [name], MD, MD. Clementeen Graham 02/09/2023, 9:06 AM,  Pager [phone] 7am-5pm  Check AMION for 5pm -7am coverage and on weekends   Note: This dictation was prepared with Dragon dictation along with smaller phrase technology. Any transcriptional errors that result from this process are unintentional.

## 2023-02-10 DIAGNOSIS — K5733 Diverticulitis of large intestine without perforation or abscess with bleeding: Secondary | ICD-10-CM | POA: Diagnosis not present

## 2023-02-10 DIAGNOSIS — I5022 Chronic systolic (congestive) heart failure: Secondary | ICD-10-CM | POA: Diagnosis not present

## 2023-02-10 LAB — CBC
HCT: 26.3 % — ABNORMAL LOW (ref 36.0–46.0)
HCT: 23.8 % — ABNORMAL LOW (ref 36.0–46.0)
Hemoglobin: 9.1 g/dL — ABNORMAL LOW (ref 12.0–15.0)
Hemoglobin: 8.1 g/dL — ABNORMAL LOW (ref 12.0–15.0)
MCH: 29.6 pg (ref 26.0–34.0)
MCH: 29.8 pg (ref 26.0–34.0)
MCHC: 34.6 g/dL (ref 30.0–36.0)
MCHC: 34 g/dL (ref 30.0–36.0)
MCV: 85.7 fL (ref 80.0–100.0)
MCV: 87.5 fL (ref 80.0–100.0)
Platelets: 305 10*3/uL (ref 150–400)
Platelets: 277 10*3/uL (ref 150–400)
RBC: 3.07 MIL/uL — ABNORMAL LOW (ref 3.87–5.11)
RBC: 2.72 MIL/uL — ABNORMAL LOW (ref 3.87–5.11)
RDW: 13.2 % (ref 11.5–15.5)
RDW: 13.2 % (ref 11.5–15.5)
WBC: 17.4 10*3/uL — ABNORMAL HIGH (ref 4.0–10.5)
WBC: 15.5 10*3/uL — ABNORMAL HIGH (ref 4.0–10.5)
nRBC: 0 % (ref 0.0–0.2)
nRBC: 0 % (ref 0.0–0.2)

## 2023-02-10 LAB — GLUCOSE, CAPILLARY
Glucose-Capillary: 141 mg/dL — ABNORMAL HIGH (ref 70–99)
Glucose-Capillary: 131 mg/dL — ABNORMAL HIGH (ref 70–99)
Glucose-Capillary: 99 mg/dL (ref 70–99)
Glucose-Capillary: 120 mg/dL — ABNORMAL HIGH (ref 70–99)

## 2023-02-10 LAB — LACTIC ACID, PLASMA: Lactic Acid, Venous: 3.2 mmol/L (ref 0.5–1.9)

## 2023-02-10 LAB — PLATELET FUNCTION ASSAY: Collagen / Epinephrine: 187 s (ref 0–193)

## 2023-02-10 LAB — HEMOGLOBIN: Hemoglobin: 8.4 g/dL — ABNORMAL LOW (ref 12.0–15.0)

## 2023-02-10 LAB — ABO/RH: ABO/RH(D): B POS

## 2023-02-10 MED ORDER — LACTATED RINGERS IV SOLN: INTRAVENOUS | Status: AC

## 2023-02-10 MED ORDER — SODIUM CHLORIDE 0.9% IV SOLUTION: Freq: Once | INTRAVENOUS | Status: AC

## 2023-02-10 NOTE — Assessment & Plan Note (Signed)
Diverticulitis.  CTA abdomen with concern of diverticulitis, no active bleeding.  Patient was on DAPT with aspirin and Brilinta due to placement of 3 recent cardiac stents. Current hemoglobin of 8.4, it was 14.32 months ago. GI was consulted-recommending tagged RBC scan for further bleeding which was done and was negative. Continue to have significant bleeding per rectum, 5 episodes overnight After discussing with cardiology we will continue with aspirin and hold Brilinta for now. -Ordered 1 unit of PRBC -Continue with Zosyn -Monitor hemoglobin -Transfuse if below 8

## 2023-02-10 NOTE — Progress Notes (Signed)
Progress Note   Patient: [name] GNF:[id number] DOB: 1945/11/24 DOA: 02/08/2023     2 DOS: the patient was seen and examined on 02/10/2023   Brief hospital course:  [name] is a 78 y.o. 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   Patient had total of 3 episodes of bright red blood per rectum, she was on aspirin and Brilinta due to recent PCI done in November 2024.  On presentation hemodynamically stable, labs with leukocytosis at 20.2, hemoglobin 11.7, decreased from 14.3  72-month ago, CO2 of 20, blood glucose 231, BUN 22, creatinine 1.02. CT angio abdomen for GI protocol with concern of acute inflammation involving the right colon near hepatic flexure, most compatible with acute diverticulitis.  No evidence of active GI bleeding.  Also noted a small right adrenal adenoma and a small hiatal hernia.   Patient was started on Zosyn.  GI and cardiology are both consulted.  Cardiology recommended continue with aspirin and hold Brilinta for now.  Patient is high risk for restenosis of stents.  Gastroenterology recommended RBC tagged scan if bleeding persists.  1/24: Hemodynamically stable, hemoglobin at 10.6, slight worsening of leukocytosis at 21.1.  BNP of 150, lactic acid 3 >>3.8 Had another episode of small amount of bleeding per rectum, RBC tagged scan ordered.  Also giving some more gentle IV fluid  1/25: Patient continued to have bleeding per rectum, overnight 5 episodes recorded.  Hemoglobin decreased to 9.1>>8.4 today.  Worsening lactic acidosis after some improvement.  Continuing aspirin per cardiology and keep holding Brilinta.  RBC tagged scan was negative. Ordered 1 unit of PRBC.    Assessment and Plan: * GI bleed Diverticulitis.  CTA abdomen with concern of diverticulitis, no  active bleeding.  Patient was on DAPT with aspirin and Brilinta due to placement of 3 recent cardiac stents. Current hemoglobin of 8.4, it was 14.32 months ago. GI was consulted-recommending tagged RBC scan for further bleeding which was done and was negative. Continue to have significant bleeding per rectum, 5 episodes overnight After discussing with cardiology we will continue with aspirin and hold Brilinta for now. -Ordered 1 unit of PRBC -Continue with Zosyn -Monitor hemoglobin -Transfuse if below 8  Coronary artery disease Patient has 3 recent stents placement, 2 on 11/4 and 1 on 11/2522 by cardiology and was started on aspirin and Brilinta. Tough situation as she will be high risk for restenosis. Discussed with cardiology and they are advising continue aspirin for now and hold Brilinta which might be switched to Plavix in a day or 2 -Continue home carvedilol -Continue with aspirin -Holding Brilinta  Lactic acidosis Worsening lactic acidosis, likely due to GI bleed and diverticulitis. -Giving some gentle IV fluid -Monitor lactic acid  Diabetes mellitus (HCC) Check A1c -SSI  Chronic HFrEF (heart failure with reduced ejection fraction) (HCC) Echocardiogram done on 01/31/2023 with EF of 45 to 50% and grade 1 diastolic dysfunction. -Continue with carvedilol -Monitor volume status   Essential hypertension Pressure currently within goal. -Holding home losartan for concern of GI bleed  OSA on CPAP -Continue CPAP at night   Subjective: Patient has 5 episodes of bleeding per rectum overnight.  Feeling weak.  She was very irritated with multiple blood draws as she is a hard stick  Physical Exam: Vitals:   02/10/23 0040 02/10/23  0410 02/10/23 0848 02/10/23 1158  BP: (!) 157/59 129/73 126/79 (!) 149/79  Pulse: 87 71 74 82  Resp: 18 18 15 15   Temp: 97.8 F (36.6 C) 99.1 F (37.3 C) 98.1 F (36.7 C) 97.9 F (36.6 C)  TempSrc: Oral     SpO2: 98% 100% 100% 98%  Weight:       Height:       General.  Obese elderly lady, in no acute distress. Pulmonary.  Lungs clear bilaterally, normal respiratory effort. CV.  Regular rate and rhythm, no JVD, rub or murmur. Abdomen.  Soft, nontender, nondistended, BS positive. CNS.  Alert and oriented .  No focal neurologic deficit. Extremities.  No edema, no cyanosis, pulses intact and symmetrical. Psychiatry.  Judgment and insight appears normal.   Data Reviewed: Prior data reviewed  Family Communication: Discussed with husband at bedside  Disposition: Status is: Inpatient Remains inpatient appropriate because: Severity of illness  Planned Discharge Destination: Home  Time spent: 50 minutes  This record has been created using Conservation officer, historic buildings. Errors have been sought and corrected,but may not always be located. Such creation errors do not reflect on the standard of care.   Author: Arnetha Courser, MD 02/10/2023 4:48 PM  For on call review [email or web address].

## 2023-02-10 NOTE — Assessment & Plan Note (Signed)
Patient has 3 recent stents placement, 2 on 11/4 and 1 on 11/2522 by cardiology and was started on aspirin and Brilinta. Tough situation as she will be high risk for restenosis. Discussed with cardiology and they are advising continue aspirin for now and hold Brilinta which might be switched to Plavix in a day or 2 -Continue home carvedilol -Continue with aspirin -Holding Brilinta

## 2023-02-10 NOTE — Progress Notes (Addendum)
   Patient Name: [name] Date of Encounter: 02/10/2023 [facility] Cardiologist: Lorine Bears, MD   Interval Summary  .    Feeling discouraged.  Had multiple episodes of bloody diarrhea overnight.  One episode this a.m.  Denies chest pain or shortness of breath.  Vital Signs .    Vitals:   02/10/23 0040 02/10/23 0410 02/10/23 0848 02/10/23 1158  BP: (!) 157/59 129/73 126/79 (!) 149/79  Pulse: 87 71 74 82  Resp: 18 18 15 15   Temp: 97.8 F (36.6 C) 99.1 F (37.3 C) 98.1 F (36.7 C) 97.9 F (36.6 C)  TempSrc: Oral     SpO2: 98% 100% 100% 98%  Weight:      Height:        Intake/Output Summary (Last 24 hours) at 02/10/2023 1220 Last data filed at 02/09/2023 2014 Gross per 24 hour  Intake 3 ml  Output --  Net 3 ml      02/09/2023    7:41 PM 02/08/2023    1:33 PM 02/02/2023    1:21 PM  Last 3 Weights  Weight (lbs) 200 lb 6.4 oz 194 lb 0.1 oz 194 lb 3.2 oz  Weight (kg) 90.9 kg 88 kg 88.089 kg      Telemetry/ECG    Sinus rhythm, PACs, PVCs- Personally Reviewed  Physical Exam .   GEN: No acute distress.   Neck: No JVD Cardiac: RRR, no murmurs, rubs, or gallops.  Respiratory: Clear to auscultation bilaterally. GI: Soft, nontender, non-distended  MS: No edema  Assessment & Plan .     # Diverticultis: # GI Bleed: Bright red blood per rectum in the setting of diverticulitis.  This seems to be slowing down, though H/H continues to downtrend.  Aspirin and ticagrelor are both on hold.  Recommend testing for genetic susceptibility to clopidogrel.  If she is sensitive, consider switching ticagrelor to clopidogrel once more stable.  Continue aspirin only for now until her GI bleeding has stopped.  Maintain hemoglobin greater than 8.  # s/p PCI 11/2022:  # Hyperlipidemia:  She underwent staged PCI of the RCA and left circumflex.  Ticagrelor currently on hold as above.  Continue carvedilol.  She has been intolerant to statins.  She is try both atorvastatin and  rosuvastatin.  Will need to consider PCSK9 inhibitor or Inclisiran as an outpatient.  Time spent: 50 minutes-Greater than 50% of this time was spent in counseling, explanation of diagnosis, planning of further management, and coordination of care.  Plan discussed with patient, her husband, and her grandson.    For questions or updates, please contact [facility] Please consult [email or web address] for contact info under        Signed, Chilton Si, MD

## 2023-02-10 NOTE — Assessment & Plan Note (Signed)
Now resolved.

## 2023-02-10 NOTE — Plan of Care (Signed)
  Problem: Education: Goal: Ability to describe self-care measures that may prevent or decrease complications (Diabetes Survival Skills Education) will improve Outcome: Progressing Goal: Individualized Educational Video(s) Outcome: Progressing   Problem: Coping: Goal: Ability to adjust to condition or change in health will improve Outcome: Progressing   Problem: Fluid Volume: Goal: Ability to maintain a balanced intake and output will improve Outcome: Progressing   Problem: Metabolic: Goal: Ability to maintain appropriate glucose levels will improve Outcome: Progressing   Problem: Nutritional: Goal: Maintenance of adequate nutrition will improve Outcome: Progressing Goal: Progress toward achieving an optimal weight will improve Outcome: Progressing   Problem: Skin Integrity: Goal: Risk for impaired skin integrity will decrease Outcome: Progressing   Problem: Tissue Perfusion: Goal: Adequacy of tissue perfusion will improve Outcome: Progressing

## 2023-02-11 ENCOUNTER — Inpatient Hospital Stay: Payer: Medicare Other | Admitting: Radiology

## 2023-02-11 ENCOUNTER — Inpatient Hospital Stay: Payer: Medicare Other

## 2023-02-11 DIAGNOSIS — K922 Gastrointestinal hemorrhage, unspecified: Secondary | ICD-10-CM

## 2023-02-11 DIAGNOSIS — E872 Acidosis, unspecified: Secondary | ICD-10-CM | POA: Diagnosis not present

## 2023-02-11 DIAGNOSIS — K5733 Diverticulitis of large intestine without perforation or abscess with bleeding: Secondary | ICD-10-CM | POA: Diagnosis not present

## 2023-02-11 DIAGNOSIS — K5732 Diverticulitis of large intestine without perforation or abscess without bleeding: Secondary | ICD-10-CM

## 2023-02-11 DIAGNOSIS — I5022 Chronic systolic (congestive) heart failure: Secondary | ICD-10-CM | POA: Diagnosis not present

## 2023-02-11 DIAGNOSIS — I10 Essential (primary) hypertension: Secondary | ICD-10-CM | POA: Diagnosis not present

## 2023-02-11 HISTORY — PX: IR ANGIOGRAM VISCERAL SELECTIVE: IMG657

## 2023-02-11 LAB — CBC
HCT: 23.2 % — ABNORMAL LOW (ref 36.0–46.0)
Hemoglobin: 7.9 g/dL — ABNORMAL LOW (ref 12.0–15.0)
MCH: 30.3 pg (ref 26.0–34.0)
MCHC: 34.1 g/dL (ref 30.0–36.0)
MCV: 88.9 fL (ref 80.0–100.0)
Platelets: 215 10*3/uL (ref 150–400)
RBC: 2.61 MIL/uL — ABNORMAL LOW (ref 3.87–5.11)
RDW: 13.6 % (ref 11.5–15.5)
WBC: 17.4 10*3/uL — ABNORMAL HIGH (ref 4.0–10.5)
nRBC: 0 % (ref 0.0–0.2)

## 2023-02-11 LAB — PREPARE RBC (CROSSMATCH)

## 2023-02-11 LAB — LACTIC ACID, PLASMA
Lactic Acid, Venous: 2.1 mmol/L (ref 0.5–1.9)
Lactic Acid, Venous: 1.4 mmol/L (ref 0.5–1.9)

## 2023-02-11 LAB — HEMOGLOBIN AND HEMATOCRIT, BLOOD
HCT: 22.7 % — ABNORMAL LOW (ref 36.0–46.0)
HCT: 25 % — ABNORMAL LOW (ref 36.0–46.0)
Hemoglobin: 8 g/dL — ABNORMAL LOW (ref 12.0–15.0)
Hemoglobin: 8.9 g/dL — ABNORMAL LOW (ref 12.0–15.0)

## 2023-02-11 LAB — MRSA NEXT GEN BY PCR, NASAL: MRSA by PCR Next Gen: NOT DETECTED

## 2023-02-11 LAB — GLUCOSE, CAPILLARY
Glucose-Capillary: 186 mg/dL — ABNORMAL HIGH (ref 70–99)
Glucose-Capillary: 110 mg/dL — ABNORMAL HIGH (ref 70–99)
Glucose-Capillary: 128 mg/dL — ABNORMAL HIGH (ref 70–99)
Glucose-Capillary: 129 mg/dL — ABNORMAL HIGH (ref 70–99)

## 2023-02-11 LAB — TROPONIN I (HIGH SENSITIVITY): Troponin I (High Sensitivity): 9 ng/L (ref <18)

## 2023-02-11 MED ORDER — PANTOPRAZOLE SODIUM 40 MG IV SOLR
80.0000 mg | Freq: Once | INTRAVENOUS | Status: AC
  Administered 2023-02-13: 80 mg via INTRAVENOUS

## 2023-02-11 MED ORDER — LIDOCAINE HCL 1 % IJ SOLN
10.0000 mL | Freq: Once | INTRAMUSCULAR | Status: AC
  Administered 2023-02-11: 8 mL via INTRADERMAL

## 2023-02-11 MED ORDER — ALPRAZOLAM 0.25 MG PO TABS: 0.2500 mg | ORAL_TABLET | Freq: Two times a day (BID) | ORAL | Status: DC | PRN

## 2023-02-11 MED ORDER — FENTANYL CITRATE (PF) 100 MCG/2ML IJ SOLN
INTRAMUSCULAR | Status: AC
  Filled 2023-02-11: qty 2

## 2023-02-11 MED ORDER — MIDAZOLAM HCL 2 MG/2ML IJ SOLN
INTRAMUSCULAR | Status: AC | PRN
  Administered 2023-02-11: 1 mg via INTRAVENOUS
  Administered 2023-02-11: .5 mg via INTRAVENOUS

## 2023-02-11 MED ORDER — SODIUM CHLORIDE 0.9% IV SOLUTION: Freq: Once | INTRAVENOUS | Status: AC

## 2023-02-11 MED ORDER — LIDOCAINE HCL 1 % IJ SOLN
INTRAMUSCULAR | Status: AC
  Filled 2023-02-11: qty 20

## 2023-02-11 MED ORDER — MIDAZOLAM HCL 2 MG/2ML IJ SOLN
INTRAMUSCULAR | Status: AC
  Filled 2023-02-11: qty 2

## 2023-02-11 MED ORDER — NOREPINEPHRINE 4 MG/250ML-% IV SOLN
INTRAVENOUS | Status: AC
  Filled 2023-02-11: qty 250

## 2023-02-11 MED ORDER — FENTANYL CITRATE (PF) 100 MCG/2ML IJ SOLN
INTRAMUSCULAR | Status: AC | PRN
  Administered 2023-02-11 (×2): 25 ug via INTRAVENOUS

## 2023-02-11 MED ORDER — SODIUM CHLORIDE 0.9% IV SOLUTION: Freq: Once | INTRAVENOUS | Status: DC

## 2023-02-11 MED ORDER — GUAIFENESIN-DM 100-10 MG/5ML PO SYRP
10.0000 mL | ORAL_SOLUTION | Freq: Once | ORAL | Status: AC
  Administered 2023-02-11: 10 mL via ORAL
  Filled 2023-02-11: qty 10

## 2023-02-11 MED ORDER — LACTATED RINGERS IV BOLUS
1000.0000 mL | Freq: Once | INTRAVENOUS | Status: AC
  Administered 2023-02-11: 1000 mL via INTRAVENOUS

## 2023-02-11 MED ORDER — TECHNETIUM TC 99M-LABELED RED BLOOD CELLS IV KIT
20.0000 | PACK | Freq: Once | INTRAVENOUS | Status: AC | PRN
Start: 2023-02-11 — End: 2023-02-11
  Administered 2023-02-11: 22.18 via INTRAVENOUS

## 2023-02-11 MED ORDER — PANTOPRAZOLE SODIUM 40 MG IV SOLR
40.0000 mg | Freq: Two times a day (BID) | INTRAVENOUS | Status: DC
  Administered 2023-02-11 – 2023-02-16 (×10): 40 mg via INTRAVENOUS
  Filled 2023-02-11 (×10): qty 10

## 2023-02-11 MED ORDER — CHLORHEXIDINE GLUCONATE CLOTH 2 % EX PADS
6.0000 | MEDICATED_PAD | Freq: Every day | CUTANEOUS | Status: DC
  Administered 2023-02-11 – 2023-02-16 (×4): 6 via TOPICAL

## 2023-02-11 MED ORDER — IOHEXOL 300 MG/ML  SOLN
150.0000 mL | Freq: Once | INTRAMUSCULAR | Status: AC | PRN
  Administered 2023-02-11: 92 mL

## 2023-02-11 NOTE — Progress Notes (Addendum)
1528 Admitted to ICU 9 via bed. Active rectal bleeding noted. CHG bath given. Cleaned of maroon stool. Unit # 1  and LR bolus infusing at time of admission. Patient alert and oriented. Admitted post Cardiac cath and PCI placement. MAE without issues. No weakness noted. After infusing unit of blood finished 2nd unit of PRBCs started and thawed plasma also started. 1629 Dr. Deanne Coffer from radiology in to discuss his procedure.Report given to [facility]. 1635 Patient taken to radiology for procedure. 1843 Report for patient's return given per Denny Peon RN [facility].

## 2023-02-11 NOTE — Plan of Care (Signed)

## 2023-02-11 NOTE — Progress Notes (Signed)
[name] , MD [address], [place], [place], [address] [address], [address], [place], [place], [address] Phone: [phone]  Fax: [phone]   [name] is being followed for diverticular bleed   Subjective:   I spoke to the patient as well as to the nurse who says that she has had intermittent blood in her stool.  Small quantity probably based on her description 5 to 10 mL each episode a few times yesterday mixed with the stool sometimes black in color.  Denies any abdominal pain or fever.  Objective: Vital signs in last 24 hours: Vitals:   02/11/23 0225 02/11/23 0227 02/11/23 0404 02/11/23 0810  BP: 121/70 (!) 124/45 103/75 (!) 111/58  Pulse: 93  78 74  Resp:   18   Temp: 98 F (36.7 C)  98.3 F (36.8 C) 99 F (37.2 C)  TempSrc:   Oral   SpO2:   97% 96%  Weight:      Height:       Weight change:   Intake/Output Summary (Last 24 hours) at 02/11/2023 0944 Last data filed at 02/10/2023 2058 Gross per 24 hour  Intake 266 ml  Output --  Net 266 ml     Exam: Heart:: Regular rate and rhythm or S1S2 present Lungs: normal Abdomen: soft, nontender, normal bowel sounds   Lab Results: @LABTEST2 @ Micro Results: Recent Results (from the past 240 hours)  Blood culture (routine x 2)     Status: None (Preliminary result)   Collection Time: 02/08/23  7:10 PM   Specimen: BLOOD  Result Value Ref Range Status   Specimen Description BLOOD BLOOD LEFT HAND  Final   Special Requests   Final    BOTTLES DRAWN AEROBIC AND ANAEROBIC Blood Culture results may not be optimal due to an inadequate volume of blood received in culture bottles   Culture   Final    NO GROWTH 3 DAYS Performed at [facility], [address], [place], [place] [address]    Report Status PENDING  Incomplete  Blood culture (routine x 2)     Status: None (Preliminary result)   Collection Time: 02/08/23  7:10 PM   Specimen: BLOOD  Result Value Ref Range Status   Specimen  Description BLOOD BLOOD RIGHT HAND  Final   Special Requests   Final    BOTTLES DRAWN AEROBIC AND ANAEROBIC Blood Culture results may not be optimal due to an inadequate volume of blood received in culture bottles   Culture   Final    NO GROWTH 3 DAYS Performed at [facility], [address] [place], [place] [address]    Report Status PENDING  Incomplete   Studies/Results: NM GI Blood Loss Result Date: 02/09/2023 CLINICAL DATA:  Hematochezia.  Faintness. EXAM: NUCLEAR MEDICINE GASTROINTESTINAL BLEEDING SCAN TECHNIQUE: Sequential abdominal images were obtained following intravenous administration of Tc-25m labeled red blood cells. RADIOPHARMACEUTICALS:  19.0 mCi Tc-62m pertechnetate in-vitro labeled red cells. COMPARISON:  None Available. FINDINGS: Physiologic distribution of radiopharmaceutical activity is seen. No sites of active GI bleeding are seen. IMPRESSION: Negative.  No evidence of active GI bleeding. Electronically Signed   By: Danae Orleans M.D.   On: 02/09/2023 17:14   Medications: I have reviewed the patient's current medications. Scheduled Meds:  sodium chloride   Intravenous Once   aspirin EC  81 mg Oral Daily   carvedilol  3.125 mg Oral BID WC   Fe Fum-Vit C-Vit B12-FA  1 capsule Oral  BID   gabapentin  200 mg Oral TID   insulin aspart  0-5 Units Subcutaneous QHS   insulin aspart  0-9 Units Subcutaneous TID WC   latanoprost  1 drop Both Eyes QHS   sodium chloride flush  3 mL Intravenous Q12H   Continuous Infusions:  piperacillin-tazobactam (ZOSYN)  IV 3.375 g (02/11/23 0547)   PRN Meds:.acetaminophen **OR** acetaminophen, ondansetron **OR** ondansetron (ZOFRAN) IV, polyethylene glycol   Assessment: Principal Problem:   GI bleed Active Problems:   OSA on CPAP   Essential hypertension   Coronary artery disease   Chronic HFrEF (heart failure with reduced ejection fraction) (HCC)   Diabetes mellitus (HCC)   Diverticulitis large intestine   Diverticulitis  of colon   Lactic acidosis  [name] 78 y.o. female who has been admitted with a diverticular bleed while on dual antiplatelet therapy following a stent placement.  Initially seen by our team on 02/09/2023.  I was asked yesterday if there was any further input we could provide for rectal bleeding.  Unfortunately in view of the diverticulitis and the endoscopic evaluation would be contraindicated due to increased risk of perforation and status lasting more I can offer from an endoscopy point of view.  I am aware that imaging and formal CT angiogram and and the CT scan and were negative previously.  The bleeding can be intermittent hence it would be important to obtain imaging at a point where one of the things the patient is actively bleeding.  If there is a significant bleed a CT angiogram would be appropriate to be ordered [greater than 1 mL/min] but if it is considered to be a slower ooze a tagged RBC CT scan would be more appropriate.  I have discussed the plan with Dr. Nelson Chimes and Dr. Deanne Coffer     LOS: 3 days   [name], MD 02/11/2023, 9:44 AM

## 2023-02-11 NOTE — Procedures (Signed)
  Procedure:  Mesenteric arteriogram no active bleed or focal vascular lesion Preprocedure diagnosis: The encounter diagnosis was Acute GI bleeding. Postprocedure diagnosis: same EBL:    minimal Complications:   none immediate  See full dictation in [employer].  Thora Lance MD Main # [phone] Pager  [phone] Mobile [phone]

## 2023-02-11 NOTE — Progress Notes (Signed)
Pt requesting to get OOB to [facility] @ approximately 2300. Pt BP 139/79 (96) just before getting out of bed. Pt able to stand and pivot to [facility], though did endorse some "wooziness". Pt had medium dark maroon liquid stool and did void in BSC. While on the [facility] and with standing for clean up and pivot back to bed, pt with increased "wooziness". Also endorses feeling clammy. BP upon arrival back to bed noted to be 112/68 (82). Cool compress applied to pt's head. Advised pt that she would likely need to try bedpan if she feels that she needs to have another BM for safety purposes. Pt in agreement, though wishing to try to attempt [facility] later this shift, if needed. Advised pt that we will reassess at that time, but at this point, concern for safety due to lightheadedness with this transfer. Pt voiced understanding. Tylenol administered for chronic back pain per pt's request. Prior to this nurse leaving the room, bed in lowest position with bed alarm on and call bell within reach. Pt's husband remains at bedside.

## 2023-02-11 NOTE — Progress Notes (Signed)
Pt states she wears HCPAP unit but is refusing facility CPAP unit for the night. She will let RN know if she feels she need a CPAP unit. RRT will cont to follow.

## 2023-02-11 NOTE — Progress Notes (Signed)
Patient left for Nuclear Medicine at 1130 from IC. Patients BP while at nuclear medicine was 72/55 at 1450, MD notified, bolus ordered. Patient arrived back from Nuclear Medicine at 1455 on 1C and bolus and PRBCs started, patients BP was 69/41 MAP 51, rapid response called and MD notified. Patient appeared pale and clammy. Patient transported to ICU, patient had a large black and red liquid stool. NP and MD at bedside. Verbal report given to Myra, Charity fundraiser.

## 2023-02-11 NOTE — Assessment & Plan Note (Signed)
Echocardiogram done on 01/31/2023 with EF of 45 to 50% and grade 1 diastolic dysfunction. -Continue with carvedilol -Monitor volume status-patient received quite a bit of fluid with ongoing GI bleed.

## 2023-02-11 NOTE — Consult Note (Signed)
NAME:  [name], MRN:  [id number], DOB:  1945-12-03, LOS: 3 ADMISSION DATE:  02/08/2023, CONSULTATION DATE:  02/11/2023 REFERRING MD:  Dr. Nelson Chimes, CHIEF COMPLAINT:  Hemorrhagic shock from Acute GI Bleed   Brief Pt Description / Synopsis:  78 year old female with past medical history significant for CAD status post recent PCI in November 2024 and HFmrEF admitted with diverticulitis and acute GI bleed.  Hospital course complicated by development of hemorrhagic shock, tagged RBC scan with active bleeding at the hepatic flexure of the colon, IR planning for angiogram and embolization.  History of Present Illness:  [name] is a 78 y.o. 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    Patient had total of 3 episodes of bright red blood per rectum, she was on aspirin and Brilinta due to recent PCI done in November 2024.  ED Course: Significant Labs: leukocytosis at 20.2, hemoglobin 11.7, decreased from 14.3 52-month ago, CO2 of 20, blood glucose 231, BUN 22, creatinine 1.02.  Imaging CT Abdomen & Pelvis>>concern of acute inflammation involving the right colon near hepatic flexure, most compatible with acute diverticulitis. No evidence of active GI bleeding. Also noted a small right adrenal adenoma and a small hiatal hernia.   Patient was admitted by the hospitalist for further workup and treatment.  GI and cardiology were consulted.  Please see significant hospital events section below for full detailed hospital course.  Pertinent  Medical History   Past Medical History:  Diagnosis Date   Asthma    Asthma due to environmental allergies    grass, mold, trees, dust   Cataract    Cellulitis    LEFT LEG   Diabetes mellitus    CONTROLLED ON DIET ALONE   Edema leg    LEFT LEG...CHRONIC    Endometrial hyperplasia    External hemorrhoid    Fatty liver    Glaucoma    Hepatic cyst    STABLE PER 05/20/2008 ULTRASOUND   Hypertension    borderline...controlled since taking herself of HCTZ IN JULY   IBS (irritable bowel syndrome)    Murmur, cardiac    Neuropathy    OSA on CPAP    Osteoarthritis    Psoriasis    Sleep apnea    CPAP    Micro Data:  1/23: Blood culture x 2>> no growth to date  Antimicrobials:   Anti-infectives (From admission, onward)    Start     Dose/Rate Route Frequency Ordered Stop   02/08/23 1900  piperacillin-tazobactam (ZOSYN) IVPB 3.375 g        3.375 g 12.5 mL/hr over 240 Minutes Intravenous Every 8 hours 02/08/23 1759          Significant Hospital Events: Including procedures, antibiotic start and stop dates in addition to other pertinent events   1/23: Admitted by [facility]. GI consulted. 1/24: Hemodynamically stable, hemoglobin at 10.6, slight worsening of leukocytosis at 21.1.  BNP of 150, lactic acid 3 >>3.8 Had another episode of small amount of bleeding per rectum, RBC tagged scan ordered.  Also giving some more gentle IV fluid 1/25: Patient continued to have bleeding per rectum, overnight 5 episodes recorded.  Hemoglobin decreased to 9.1>>8.4 today.  Worsening lactic acidosis after some improvement.  Continuing aspirin per cardiology and keep holding Brilinta.  RBC tagged scan  was negative. Ordered 1 unit of PRBC. 1/26: Became briefly unresponsive in setting of severe hypotension from acute GI bleed.  Transferred to ICU with improvement in blood pressure and mental status with fluids and blood.  Tagged RBC scan with acute bleed at hepatic flexure, IR planning for angiogram and possible embolization.  PCCM consulted in case she were to need pressors.  Interim History / Subjective:  -Earlier patient became poorly responsive with systolic blood pressures in the 60s and 70s while in nuc med -Currently in ICU receiving fluids and blood  ~mentation has returned to baseline and systolic blood pressure in the low 100s -Tagged RBC scan concerning for active bleed at the hepatic flexure of the colon ~IR consulted and is planning on angiogram and embolization -PCCM was consulted in case she were to continue to deteriorate and need vasopressors  Objective   Blood pressure 96/74, pulse (!) 109, temperature 97.6 F (36.4 C), temperature source Oral, resp. rate (!) 23, height 5\' 6"  (1.676 m), weight 90.9 kg, SpO2 94%.        Intake/Output Summary (Last 24 hours) at 02/11/2023 1546 Last data filed at 02/11/2023 1423 Gross per 24 hour  Intake 626 ml  Output --  Net 626 ml   Filed Weights   02/08/23 1333 02/09/23 1941  Weight: 88 kg 90.9 kg    Examination: General: Acutely ill-appearing female, laying in bed, on room air, no acute distress HENT: Atraumatic, normocephalic, neck supple, no JVD Lungs: Clear breath sounds throughout, even, nonlabored Cardiovascular: Regular rate and rhythm, S1-S2, no murmurs, rubs, gallops Abdomen: Obese, soft, slightly tender to palpation to right upper and lower quadrants, no guarding or rebound tenderness, bowel sounds positive x 4 Extremities: No bulk and tone, no deformities, no edema, no cyanosis Neuro: Awake and alert, oriented x 4, moves all extremities to command, no focal deficits, speech clear, pupils PERRLA GU: Deferred  Resolved Hospital Problem list     Assessment & Plan:   #Hemorrhagic Shock #CAD s/p recent stage PCI of the RCA and left Circumflex on 11/2022 #HFmrEF without acute exacerbation PMHx: HTN, HLD Echocardiogram 01/31/2023 with EF of 45 to 50% and grade 1 diastolic dysfunction.  -Continuous cardiac monitoring -Maintain MAP >65 -IV fluids -Transfusions as indicated -Vasopressors as needed to maintain MAP goal -Trend lactic acid until normalized -Trend HS Troponin until peaked -Diuresis as BP and renal function permits ~ holding due to shock -Cardiology  following, appreciate input -Holding Brilinta for now given acute bleed -Hold Coreg  #Acute Blood Loss Anemia #Acute GI Bleed #Diverticulitis Tagged RBC scan on 1/26 with active bleeding in the hepatic flexure of the colon -Monitor for S/Sx of bleeding -Trend CBC (J&H q6h) -SCD's for VTE Prophylaxis  -Transfuse for Hgb <8 given cardiac history and shock ~ ordered for 2 units pRBC's and 2 FFP on 1/26 -Give PPI 80 mg x1 dose, followed by 40 mg BID -GI following, appreciate input -IR consulted, appreciate input ~ plan for emergent angiogram and embolization -Continue Zosyn for now  #OSA on CPAP -Supplemental O2 as needed to maintain O2 sats >92% -CPAP qhs -Follow intermittent Chest X-ray & ABG as needed -Bronchodilators prn -Pulmonary toilet as able  #Acute Metabolic Encephalopathy in setting of Hypotension ~ RESOLVED -Treatment of metabolic derangements as outlined above -Provide supportive care -Promote normal sleep/wake cycle and family presence -Avoid sedating medications as able      Best Practice (right click and "Reselect all SmartList Selections" daily)   Diet/type: NPO DVT prophylaxis: SCD  GI prophylaxis: PPI Lines: N/A Foley:  N/A Code Status:  full code Last date of multidisciplinary goals of care discussion [N/A]  1/26: pt and her husband updated at bedside on plan of care.  Labs   CBC: Recent Labs  Lab 02/09/23 0607 02/09/23 1141 02/09/23 1958 02/10/23 0451 02/10/23 1447 02/10/23 1709 02/11/23 0230 02/11/23 0438  WBC 21.1*  --  20.9* 17.4*  --  15.5*  --  17.4*  HGB 10.6*   < > 11.2* 9.1* 8.4* 8.1* 8.0* 7.9*  HCT 31.6*  --  33.1* 26.3*  --  23.8* 22.7* 23.2*  MCV 89.8  --  87.6 85.7  --  87.5  --  88.9  PLT 332  --  339 305  --  277  --  215   < > = values in this interval not displayed.    Basic Metabolic Panel: Recent Labs  Lab 02/08/23 1332 02/09/23 0607  NA 136 139  K 4.2 4.5  CL 104 108  CO2 20* 22  GLUCOSE 231* 148*  BUN 22  20  CREATININE 1.02* 0.79  CALCIUM 8.5* 8.3*   GFR: Estimated Creatinine Clearance: 65.8 mL/min (by C-G formula based on SCr of 0.79 mg/dL). Recent Labs  Lab 02/09/23 0910 02/09/23 1958 02/10/23 0451 02/10/23 1026 02/10/23 1709 02/11/23 0438  WBC  --  20.9* 17.4*  --  15.5* 17.4*  LATICACIDVEN 3.8* 2.5*  --  3.2*  --  2.1*    Liver Function Tests: No results for input(s): "AST", "ALT", "ALKPHOS", "BILITOT", "PROT", "ALBUMIN" in the last 168 hours. No results for input(s): "LIPASE", "AMYLASE" in the last 168 hours. No results for input(s): "AMMONIA" in the last 168 hours.  ABG No results found for: "PHART", "PCO2ART", "PO2ART", "HCO3", "TCO2", "ACIDBASEDEF", "O2SAT"   Coagulation Profile: No results for input(s): "INR", "PROTIME" in the last 168 hours.  Cardiac Enzymes: No results for input(s): "CKTOTAL", "CKMB", "CKMBINDEX", "TROPONINI" in the last 168 hours.  HbA1C: Hgb A1c MFr Bld  Date/Time Value Ref Range Status  02/09/2023 02:52 AM 6.2 (H) 4.8 - 5.6 % Final    Comment:    (NOTE) Pre diabetes:          5.7%-6.4%  Diabetes:              >6.4%  Glycemic control for   <7.0% adults with diabetes   11/13/2022 08:45 AM 7.1 (H) 4.6 - 6.5 % Final    Comment:    Glycemic Control Guidelines for People with Diabetes:Non Diabetic:  <6%Goal of Therapy: <7%Additional Action Suggested:  >8%     CBG: Recent Labs  Lab 02/10/23 2104 02/11/23 0207 02/11/23 0818 02/11/23 1121 02/11/23 1524  GLUCAP 120* 186* 110* 128* 129*    Review of Systems:   Positives in BOLD: Gen: Denies fever, chills, weight change, fatigue, night sweats HEENT: Denies blurred vision, double vision, hearing loss, tinnitus, sinus congestion, rhinorrhea, sore throat, neck stiffness, dysphagia PULM: Denies shortness of breath, cough, sputum production, hemoptysis, wheezing CV: Denies chest pain, edema, orthopnea, paroxysmal nocturnal dyspnea, palpitations GI: Denies abdominal pain, nausea,  vomiting, diarrhea, hematochezia, melena, constipation, change in bowel habits GU: Denies dysuria, hematuria, polyuria, oliguria, urethral discharge Endocrine: Denies hot or cold intolerance, polyuria, polyphagia or appetite change Derm: Denies rash, dry skin, scaling or peeling skin change Heme: Denies easy bruising, bleeding, bleeding gums Neuro: Denies headache, numbness, weakness, slurred speech, loss of memory or consciousness   Past Medical History:  She,  has a past medical history  of Asthma, Asthma due to environmental allergies, Cataract, Cellulitis, Diabetes mellitus, Edema leg, Endometrial hyperplasia, External hemorrhoid, Fatty liver, Glaucoma, Hepatic cyst, Hypertension, IBS (irritable bowel syndrome), Murmur, cardiac, Neuropathy, OSA on CPAP, Osteoarthritis, Psoriasis, and Sleep apnea.   Surgical History:   Past Surgical History:  Procedure Laterality Date   BONE DENSITY TEST  01/17/2004   CATARACT EXTRACTION, BILATERAL Bilateral    May 2023 and June 2023   CORONARY STENT INTERVENTION N/A 11/20/2022   Procedure: CORONARY STENT INTERVENTION;  Surgeon: Iran Ouch, MD;  Location: [facility] INVASIVE CV LAB;  Service: Cardiovascular;  Laterality: N/A;   CORONARY STENT INTERVENTION Right 12/11/2022   Procedure: CORONARY STENT INTERVENTION;  Surgeon: Iran Ouch, MD;  Location: [facility] INVASIVE CV LAB;  Service: Cardiovascular;  Laterality: Right;   HYSTEROSCOPY WITH D & C  01/16/2005   BC OF ABNORAL PAP AND ENDOMETRIAL HYPERPLASIA   LEFT HEART CATH AND CORONARY ANGIOGRAPHY N/A 11/20/2022   Procedure: LEFT HEART CATH AND CORONARY ANGIOGRAPHY;  Surgeon: Iran Ouch, MD;  Location: [facility] INVASIVE CV LAB;  Service: Cardiovascular;  Laterality: N/A;   LEFT HEART CATH AND CORONARY ANGIOGRAPHY N/A 12/11/2022   Procedure: LEFT HEART CATH AND CORONARY ANGIOGRAPHY;  Surgeon: Iran Ouch, MD;  Location: [facility] INVASIVE CV LAB;  Service: Cardiovascular;  Laterality: N/A;   ONE  VAGINAL DELIVERY     UMBILICAL HERNIA REPAIR  01/17/2008   VAGINAL HYSTERECTOMY  01/17/2008     Social History:   reports that she quit smoking about 45 years ago. Her smoking use included cigarettes. She has never used smokeless tobacco. She reports that she does not drink alcohol and does not use drugs.   Family History:  Her family history includes Heart disease (age of onset: 84) in her father; Mental illness in her mother. There is no history of Cancer or Breast cancer.   Allergies Allergies  Allergen Reactions   Meloxicam Other (See Comments)    Hypertension, tachycardia   Statins Other (See Comments)    "Muscle weakness"   Sulfa Drugs Cross Reactors    Zostavax [Zoster Vaccine Live]     Bad reaction      Home Medications  Prior to Admission medications   Medication Sig Start Date End Date Taking? Authorizing Provider  albuterol (VENTOLIN HFA) 108 (90 Base) MCG/ACT inhaler Inhale 2 puffs into the lungs every 6 (six) hours as needed for wheezing or shortness of breath.   Yes [provider]  ALPRAZolam (XANAX) 0.25 MG tablet Take 1 tablet (0.25 mg total) by mouth 2 (two) times daily as needed for anxiety. 11/17/22  Yes Sherlene Shams, MD  aspirin EC 81 MG tablet Take 1 tablet (81 mg total) by mouth daily. Swallow whole. 11/22/22  Yes Alexander, Dorene Grebe, DO  bimatoprost (LUMIGAN) 0.01 % SOLN Place 1 drop into both eyes at bedtime. 12/19/22  Yes   carvedilol (COREG) 3.125 MG tablet Take 1 tablet (3.125 mg total) by mouth 2 (two) times daily with a meal. 11/30/22  Yes Furth, Cadence H, PA-C  cetirizine (ZYRTEC) 10 MG tablet Take 10 mg by mouth daily.   Yes [provider]  EPINEPHrine 0.3 mg/0.3 mL IJ SOAJ injection as needed. 07/23/18  Yes [provider]  gabapentin (NEURONTIN) 100 MG capsule Take 1 capsule (100 mg total) by mouth 3 (three) times daily. If no improvement after 4 or 5 days increase to 2 capsules 3 times a day 01/29/23  Yes Tera Partridge, PA   losartan (  COZAAR) 25 MG tablet Take 0.5 tablets (12.5 mg total) by mouth daily. 11/30/22  Yes Furth, Cadence H, PA-C  metoprolol succinate (TOPROL-XL) 25 MG 24 hr tablet Take 25 mg by mouth at bedtime. 11/17/22  Yes [provider]  MILK THISTLE PO Take 1 capsule by mouth daily.    Yes [provider]  nitroGLYCERIN (NITROSTAT) 0.4 MG SL tablet Place 1 tablet (0.4 mg total) under the tongue every 5 (five) minutes for up to 3 doses as needed for chest pain. 11/21/22  Yes Sunnie Nielsen, DO  Probiotic Product (PROBIOTIC COMPLEX ACIDOPHILUS PO) Take 1 capsule by mouth daily.   Yes [provider]  sodium chloride (OCEAN) 0.65 % SOLN nasal spray Place 1 spray into the nose as needed.   Yes [provider]  ticagrelor (BRILINTA) 90 MG TABS tablet Take 1 tablet (90 mg total) by mouth 2 (two) times daily. 11/30/22  Yes Furth, Cadence H, PA-C  tildrakizumab-asmn (ILUMYA) 100 MG/ML subcutaneous injection Inject 100 mg into the skin every 3 (three) months.   Yes [provider]  budesonide (PULMICORT) 180 MCG/ACT inhaler Inhale 2 puffs into the lungs 2 (two) times daily. Patient not taking: Reported on 02/08/2023    [provider]  ondansetron (ZOFRAN) 4 MG tablet Take 1 tablet (4 mg total) by mouth every 8 (eight) hours as needed for nausea or vomiting. Patient not taking: Reported on 02/08/2023 09/21/21   Sherlene Shams, MD     Critical care time: 55 minutes     Harlon Ditty, AGACNP-BC [facility] Prefer epic messenger for cross cover needs If after hours, please call E-link

## 2023-02-11 NOTE — Progress Notes (Signed)
Progress Note   Patient: [name] ZOX:[id number] DOB: 1945-06-04 DOA: 02/08/2023     3 DOS: the patient was seen and examined on 02/11/2023   Brief hospital course:  [name] is a 78 y.o. 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   Patient had total of 3 episodes of bright red blood per rectum, she was on aspirin and Brilinta due to recent PCI done in November 2024.  On presentation hemodynamically stable, labs with leukocytosis at 20.2, hemoglobin 11.7, decreased from 14.3  47-month ago, CO2 of 20, blood glucose 231, BUN 22, creatinine 1.02. CT angio abdomen for GI protocol with concern of acute inflammation involving the right colon near hepatic flexure, most compatible with acute diverticulitis.  No evidence of active GI bleeding.  Also noted a small right adrenal adenoma and a small hiatal hernia.   Patient was started on Zosyn.  GI and cardiology are both consulted.  Cardiology recommended continue with aspirin and hold Brilinta for now.  Patient is high risk for restenosis of stents.  Gastroenterology recommended RBC tagged scan if bleeding persists.  1/24: Hemodynamically stable, hemoglobin at 10.6, slight worsening of leukocytosis at 21.1.  BNP of 150, lactic acid 3 >>3.8 Had another episode of small amount of bleeding per rectum, RBC tagged scan ordered.  Also giving some more gentle IV fluid  1/25: Patient continued to have bleeding per rectum, overnight 5 episodes recorded.  Hemoglobin decreased to 9.1>>8.4 today.  Worsening lactic acidosis after some improvement.  Continuing aspirin per cardiology and keep holding Brilinta.  RBC tagged scan was negative. Ordered 1 unit of PRBC.   1/26: Continue to bleed, hemoglobin was 7.9 this morning after getting 1 unit yesterday.  Patient  was sent for another RBC tagged scan, towards the end of the scan she felt little chest pressure and at the end she suddenly became hypotensive, cold and clammy, rapid response was called.  Patient was given emergent blood transfusion and 1 L of bolus and transferred to ICU.  RBC tagged scan came back positive for active bleeding just below hepatic flexure, IR is going to do embolization as she continued to have significant bleeding per rectum. Emergency protocol blood transfusion and FFP ordered. Blood pressure responding to IV fluid and transfusions. Ordered troponin, cardiology and GI are also aware.  PCCM on board   Assessment and Plan: * Acute GI bleeding Diverticulitis.  CTA abdomen with concern of diverticulitis, no active bleeding.  Patient was on DAPT with aspirin and Brilinta due to placement of 3 recent cardiac stents. Current hemoglobin of 7.9, it was 14.32 months ago. After discussing with cardiology we will continue with aspirin and hold Brilinta for now. Patient continued to have significant bleeding, repeat tagged RBC scan shows active colonic bleeding from right hepatic flexure, towards the end of scan patient went into hemorrhagic shock, with systolic in 50s, cold and crampy and having a large amount of bleeding per rectum.  Transferred to ICU Rapid response was called. 1 L bolus and emergency blood transfusion protocol activated -PCCM consulted -IR consulted and they will take her for emergency embolization -Low threshold for pressors-blood pressure currently responding to fluid resuscitation. -Continue with Zosyn -Monitor hemoglobin -Transfuse if below 8  Coronary artery disease Patient has 3 recent stents placement, 2 on 11/4 and 1  on 11/2522 by cardiology and was started on aspirin and Brilinta. Tough situation as she will be high risk for restenosis. Discussed with cardiology and they are advising continue aspirin for now and hold Brilinta which might be switched to  Plavix in a day or 2 Mild chest pressure during scan today when her blood pressure dropped. -Repeating troponin -Cardiology is aware -Continue home carvedilol -Continue with aspirin -Holding Brilinta  Lactic acidosis Worsening lactic acidosis, likely due to GI bleed and diverticulitis. -Patient is getting fluid resuscitation -Monitor lactic acid  Diabetes mellitus (HCC) Check A1c -SSI  Chronic HFrEF (heart failure with reduced ejection fraction) (HCC) Echocardiogram done on 01/31/2023 with EF of 45 to 50% and grade 1 diastolic dysfunction. -Continue with carvedilol -Monitor volume status-patient is getting fluid resuscitation due to developing hemorrhagic shock.   Essential hypertension Pressure currently within goal. -Holding home losartan for concern of GI bleed  OSA on CPAP -Continue CPAP at night   Subjective: Patient was feeling very weak and continue to have bleeding per rectum when seen during morning rounds.  Later on rapid response was called when she became profoundly hypotensive, cold and clammy and a large bleeding per rectum towards the end of RBC tagged scan.  Physical Exam: Vitals:   02/11/23 1120 02/11/23 1355 02/11/23 1502 02/11/23 1524  BP: 128/64 123/64 (!) 69/41 96/74  Pulse: 81  74 (!) 109  Resp: 19  16 (!) 23  Temp: 98.9 F (37.2 C)  97.6 F (36.4 C) 98 F (36.7 C)  TempSrc: Oral  Oral Oral  SpO2: 100%  100% 94%  Weight:    92.6 kg  Height:    5\' 6"  (1.676 m)   General.  Overweight elderly lady, appears pale, cold and clammy. Pulmonary.  Lungs clear bilaterally, normal respiratory effort. CV.  Regular rate and rhythm, no JVD, rub or murmur. Abdomen.  Soft, nontender, nondistended, BS positive. CNS.  Awake but became disoriented transiently.  No focal neurologic deficit. Extremities.  No edema, no cyanosis, pulses intact and symmetrical. Psychiatry.  Judgment and insight appears normal.    Data Reviewed: Prior data reviewed  Family  Communication: Discussed with husband at bedside  Disposition: Status is: Inpatient Remains inpatient appropriate because: Severity of illness  Planned Discharge Destination: Home  Time spent: 60 minutes  This record has been created using Conservation officer, historic buildings. Errors have been sought and corrected,but may not always be located. Such creation errors do not reflect on the standard of care.   Author: Arnetha Courser, MD 02/11/2023 4:00 PM  For on call review [email or web address].

## 2023-02-11 NOTE — Consult Note (Signed)
Chief Complaint: Patient was seen in consultation today for  Chief Complaint  Patient presents with   Rectal Bleeding   at the request of Dr. Nelson Chimes   Referring Physician(s): Arnetha Courser MD  Supervising Physician: Oley Balm  Patient Status: [facility] - In-pt  History of Present Illness: [name] is a 78 y.o. female h/o recent coronary stenting, present with LGIB. Initial CTA and tagged RBC study negative. DOACs discontinued. Remains on ASA. Diverticula with regional inflammatory changes around prox transverse segment. Due to continued clinical evidence of blood loss, tagged RBC scan repeated, demonstrating active extravasation convincingly localized to hepatic flexure of colon. GI defers colonoscopy due to active diverticulitis.  Past Medical History:  Diagnosis Date   Asthma    Asthma due to environmental allergies    grass, mold, trees, dust   Cataract    Cellulitis    LEFT LEG   Diabetes mellitus    CONTROLLED ON DIET ALONE   Edema leg    LEFT LEG...CHRONIC   Endometrial hyperplasia    External hemorrhoid    Fatty liver    Glaucoma    Hepatic cyst    STABLE PER 05/20/2008 ULTRASOUND   Hypertension    borderline...controlled since taking herself of HCTZ IN JULY   IBS (irritable bowel syndrome)    Murmur, cardiac    Neuropathy    OSA on CPAP    Osteoarthritis    Psoriasis    Sleep apnea    CPAP    Past Surgical History:  Procedure Laterality Date   BONE DENSITY TEST  01/17/2004   CATARACT EXTRACTION, BILATERAL Bilateral    May 2023 and June 2023   CORONARY STENT INTERVENTION N/A 11/20/2022   Procedure: CORONARY STENT INTERVENTION;  Surgeon: Iran Ouch, MD;  Location: [facility] INVASIVE CV LAB;  Service: Cardiovascular;  Laterality: N/A;   CORONARY STENT INTERVENTION Right 12/11/2022   Procedure: CORONARY STENT INTERVENTION;  Surgeon: Iran Ouch, MD;  Location: [facility] INVASIVE CV [facility];  Service: Cardiovascular;  Laterality: Right;    HYSTEROSCOPY WITH D & C  01/16/2005   BC OF ABNORAL PAP AND ENDOMETRIAL HYPERPLASIA   LEFT HEART CATH AND CORONARY ANGIOGRAPHY N/A 11/20/2022   Procedure: LEFT HEART CATH AND CORONARY ANGIOGRAPHY;  Surgeon: Iran Ouch, MD;  Location: [facility] INVASIVE CV [facility];  Service: Cardiovascular;  Laterality: N/A;   LEFT HEART CATH AND CORONARY ANGIOGRAPHY N/A 12/11/2022   Procedure: LEFT HEART CATH AND CORONARY ANGIOGRAPHY;  Surgeon: Iran Ouch, MD;  Location: [facility] INVASIVE CV LAB;  Service: Cardiovascular;  Laterality: N/A;   ONE VAGINAL DELIVERY     UMBILICAL HERNIA REPAIR  01/17/2008   VAGINAL HYSTERECTOMY  01/17/2008    Allergies: Meloxicam, Statins, Sulfa drugs cross reactors, and Zostavax [zoster vaccine live]  Medications: Prior to Admission medications   Medication Sig Start Date End Date Taking? Authorizing Provider  albuterol (VENTOLIN HFA) 108 (90 Base) MCG/ACT inhaler Inhale 2 puffs into the lungs every 6 (six) hours as needed for wheezing or shortness of breath.   Yes [provider]  ALPRAZolam (XANAX) 0.25 MG tablet Take 1 tablet (0.25 mg total) by mouth 2 (two) times daily as needed for anxiety. 11/17/22  Yes Sherlene Shams, MD  aspirin EC 81 MG tablet Take 1 tablet (81 mg total) by mouth daily. Swallow whole. 11/22/22  Yes Alexander, Dorene Grebe, DO  bimatoprost (LUMIGAN) 0.01 % SOLN Place 1 drop into both eyes at bedtime. 12/19/22  Yes   carvedilol (COREG)  3.125 MG tablet Take 1 tablet (3.125 mg total) by mouth 2 (two) times daily with a meal. 11/30/22  Yes Furth, Cadence H, PA-C  cetirizine (ZYRTEC) 10 MG tablet Take 10 mg by mouth daily.   Yes [provider]  EPINEPHrine 0.3 mg/0.3 mL IJ SOAJ injection as needed. 07/23/18  Yes [provider]  gabapentin (NEURONTIN) 100 MG capsule Take 1 capsule (100 mg total) by mouth 3 (three) times daily. If no improvement after 4 or 5 days increase to 2 capsules 3 times a day 01/29/23  Yes Tera Partridge, PA  losartan  (COZAAR) 25 MG tablet Take 0.5 tablets (12.5 mg total) by mouth daily. 11/30/22  Yes Furth, Cadence H, PA-C  metoprolol succinate (TOPROL-XL) 25 MG 24 hr tablet Take 25 mg by mouth at bedtime. 11/17/22  Yes [provider]  MILK THISTLE PO Take 1 capsule by mouth daily.    Yes [provider]  nitroGLYCERIN (NITROSTAT) 0.4 MG SL tablet Place 1 tablet (0.4 mg total) under the tongue every 5 (five) minutes for up to 3 doses as needed for chest pain. 11/21/22  Yes Sunnie Nielsen, DO  Probiotic Product (PROBIOTIC COMPLEX ACIDOPHILUS PO) Take 1 capsule by mouth daily.   Yes [provider]  sodium chloride (OCEAN) 0.65 % SOLN nasal spray Place 1 spray into the nose as needed.   Yes [provider]  ticagrelor (BRILINTA) 90 MG TABS tablet Take 1 tablet (90 mg total) by mouth 2 (two) times daily. 11/30/22  Yes Furth, Cadence H, PA-C  tildrakizumab-asmn (ILUMYA) 100 MG/ML subcutaneous injection Inject 100 mg into the skin every 3 (three) months.   Yes [provider]  budesonide (PULMICORT) 180 MCG/ACT inhaler Inhale 2 puffs into the lungs 2 (two) times daily. Patient not taking: Reported on 02/08/2023    [provider]  ondansetron (ZOFRAN) 4 MG tablet Take 1 tablet (4 mg total) by mouth every 8 (eight) hours as needed for nausea or vomiting. Patient not taking: Reported on 02/08/2023 09/21/21   Sherlene Shams, MD     Family History  Problem Relation Age of Onset   Mental illness Mother        DEMENTIA   Heart disease Father 23       MI.Marland KitchenMarland KitchenS/D AVR/CABG x3   Cancer Neg Hx        no ovarian colon breast   Breast cancer Neg Hx     Social History   Socioeconomic History   Marital status: Married    Spouse name: Not on file   Number of children: 2   Years of education: Not on file   Highest education level: Some college, no degree  Occupational History   Occupation: RETIRED  Tobacco Use   Smoking status: Former    Current packs/day: 0.00     Types: Cigarettes    Quit date: 01/16/1978    Years since quitting: 45.1   Smokeless tobacco: Never   Tobacco comments:    REMOTELY QUIT IN 1987 AFTER A FEW YEARS OF USE  Vaping Use   Vaping status: Never Used  Substance and Sexual Activity   Alcohol use: No   Drug use: No   Sexual activity: Not on file  Other Topics Concern   Not on file  Social History Narrative   Lives with husband.   Social Drivers of Corporate investment banker Strain: Low Risk  (02/01/2023)   Received from [facility]   Overall Financial  Resource Strain (CARDIA)    Difficulty of Paying Living Expenses: Not hard at all  Food Insecurity: No Food Insecurity (02/09/2023)   Hunger Vital Sign    Worried About Running Out of Food in the Last Year: Never true    Ran Out of Food in the Last Year: Never true  Transportation Needs: No Transportation Needs (02/09/2023)   PRAPARE - Administrator, Civil Service (Medical): No    Lack of Transportation (Non-Medical): No  Recent Concern: Transportation Needs - Unmet Transportation Needs (11/21/2022)   PRAPARE - Transportation    Lack of Transportation (Medical): Yes    Lack of Transportation (Non-Medical): Yes  Physical Activity: Unknown (07/30/2018)   Exercise Vital Sign    Days of Exercise per Week: 0 days    Minutes of Exercise per Session: Not on file  Stress: No Stress Concern Present (07/30/2018)   [employer] of Occupational Health - Occupational Stress Questionnaire    Feeling of Stress : Not at all  Social Connections: Socially Integrated (02/09/2023)   Social Connection and Isolation Panel [NHANES]    Frequency of Communication with Friends and Family: More than three times a week    Frequency of Social Gatherings with Friends and Family: Patient unable to answer    Attends Religious Services: More than 4 times per year    Active Member of [employer] or Organizations: No    Attends Engineer, structural: More than 4  times per year    Marital Status: Married    ECOG Status: 3 - Symptomatic, >50% confined to bed  Review of Systems: A 12 point ROS discussed and pertinent positives are indicated in the HPI above.  All other systems are negative.  Review of Systems  Vital Signs: BP 124/61   Pulse 88   Temp 98 F (36.7 C) (Oral)   Resp 12   Ht 5\' 6"  (1.676 m)   Wt 92.6 kg   SpO2 100%   BMI 32.95 kg/m   Advance Care Plan: The advanced care plan/surrogate decision maker was discussed at the time of visit and documented in the medical record.    Physical Exam Constitutional: Oriented to person, place, and time. Well-developed and well-nourished. No distress. In bed. Last Weight  Most recent update: 02/11/2023  3:49 PM    Weight  92.6 kg (204 lb 2.3 oz)            HENT:  Head: Normocephalic and atraumatic.  Eyes: Conjunctivae and EOM are normal. Right eye exhibits no discharge. Left eye exhibits no discharge. No scleral icterus.  Neck: No JVD present.  Pulmonary/Chest: Effort normal. No stridor. No respiratory distress.  Abdomen: soft, non distended Neurological:  alert and oriented to person, place, and time.  Skin: Skin is warm and dry.  not diaphoretic.  Psychiatric:   normal mood and affect.   behavior is normal. Judgment and thought content normal.   Imaging: NM GI Blood Loss Result Date: 02/11/2023 CLINICAL DATA:  GI bleeding. EXAM: NUCLEAR MEDICINE GASTROINTESTINAL BLEEDING SCAN TECHNIQUE: Sequential abdominal images were obtained following intravenous administration of Tc-33m labeled red blood cells. RADIOPHARMACEUTICALS:  22.2 mCi Tc-24m pertechnetate in-vitro labeled red cells. COMPARISON:  02/09/2023 FINDINGS: Normal blood pool activity seen as well as activity within the bladder. Abnormal bowel activity is initially seen in the hepatic flexure of the colon on the 51-55 min image, with subsequent progression throughout the transverse and descending colon. IMPRESSION: Active GI  bleeding site identified in the hepatic  flexure of the colon. Electronically Signed   By: Danae Orleans M.D.   On: 02/11/2023 14:43   NM GI Blood Loss Result Date: 02/09/2023 CLINICAL DATA:  Hematochezia.  Faintness. EXAM: NUCLEAR MEDICINE GASTROINTESTINAL BLEEDING SCAN TECHNIQUE: Sequential abdominal images were obtained following intravenous administration of Tc-17m labeled red blood cells. RADIOPHARMACEUTICALS:  19.0 mCi Tc-60m pertechnetate in-vitro labeled red cells. COMPARISON:  None Available. FINDINGS: Physiologic distribution of radiopharmaceutical activity is seen. No sites of active GI bleeding are seen. IMPRESSION: Negative.  No evidence of active GI bleeding. Electronically Signed   By: Danae Orleans M.D.   On: 02/09/2023 17:14   CT ANGIO GI BLEED Result Date: 02/08/2023 CLINICAL DATA:  GI bleed. EXAM: CTA ABDOMEN AND PELVIS WITHOUT AND WITH CONTRAST TECHNIQUE: Multidetector CT imaging of the abdomen and pelvis was performed using the standard protocol during bolus administration of intravenous contrast. Multiplanar reconstructed images and MIPs were obtained and reviewed to evaluate the vascular anatomy. RADIATION DOSE REDUCTION: This exam was performed according to the departmental dose-optimization program which includes automated exposure control, adjustment of the mA and/or kV according to patient size and/or use of iterative reconstruction technique. CONTRAST:  OMNIPAQUE IOHEXOL 350 MG/ML SOLN COMPARISON:  CT abdomen pelvis 05/16/2017 FINDINGS: VASCULAR Aorta: Atherosclerotic disease in the abdominal aorta without aneurysm, dissection or significant stenosis. Celiac: Narrowing of the proximal celiac artery appears to be related to median arcuate ligament compression. SMA: Patent without evidence of aneurysm, dissection, vasculitis or significant stenosis. Renals: Both renal arteries are patent without evidence of aneurysm, dissection, vasculitis, fibromuscular dysplasia or significant  stenosis. IMA: Patent without evidence of aneurysm, dissection, vasculitis or significant stenosis. Inflow: Patent without evidence of aneurysm, dissection, vasculitis or significant stenosis. Proximal Outflow: Proximal femoral arteries are patent bilaterally. Veins: Portal venous system is patent. No abnormality to the IVC or iliac veins. Bilateral renal veins are patent. Review of the MIP images confirms the above findings. NON-VASCULAR Lower chest: Lung bases are clear. Hepatobiliary: Small low-density structures in the liver likely represent small hepatic cysts. No biliary dilatation. Normal appearance of the gallbladder. Pancreas: Unremarkable. No pancreatic ductal dilatation or surrounding inflammatory changes. Spleen: Normal in size without focal abnormality. Adrenals/Urinary Tract: Small nodule in the right adrenal gland measures 8 mm and the Hounsfield units are 6 on the noncontrast images. This is suggestive for a small adrenal adenoma. Normal appearance of the left adrenal gland. No hydronephrosis. No suspicious renal lesions. Normal appearance of the urinary bladder. No renal calculi. Stomach/Bowel: No evidence for active GI bleeding. Small hiatal hernia. Normal appearance of the stomach. Normal appearance of the small bowel. Pericolonic inflammation involving the right colon near the hepatic flexure and there are diverticula in this area. Findings are suggestive for acute diverticulitis. Normal appendix. Lymphatic: No significant lymph node enlargement in the abdomen or pelvis. Reproductive: Status post hysterectomy. No adnexal masses. Other: Periumbilical ventral hernia containing fat. No evidence for free fluid. Negative for free air. Musculoskeletal: No acute bone abnormality. IMPRESSION: VASCULAR 1. Atherosclerotic disease in the abdominal aorta without aneurysm. Aortic Atherosclerosis (ICD10-I70.0). 2. Narrowing of the proximal celiac artery likely related to median arcuate ligament compression.  NON-VASCULAR 1. Acute inflammation involving the right colon near the hepatic flexure. Findings are most compatible with acute diverticulitis. 2. No evidence for active GI bleeding. 3. Small periumbilical hernia containing fat. 4. Small right adrenal adenoma. 5. Small hiatal hernia. Electronically Signed   By: Richarda Overlie M.D.   On: 02/08/2023 15:48   ECHOCARDIOGRAM LIMITED Result  Date: 01/30/2023    ECHOCARDIOGRAM LIMITED REPORT   Patient Name:   IVRY PIGUE Date of Exam: 01/30/2023 Medical Rec #:  [id number]     Height:       66.0 in Accession #:    [id number]    Weight:       196.8 lb Date of Birth:  1945-06-01      BSA:          1.986 m Patient Age:    77 years      BP:           134/84 mmHg Patient Gender: F             HR:           61 bpm. Exam Location:   Procedure: 2D Echo, Limited Echo, Cardiac Doppler, Limited Color Doppler and            Intracardiac Opacification Agent Indications:    I50.40* Unspecified combined systolic (congestive) and diastolic                 (congestive) heart failure  History:        Patient has prior history of Echocardiogram examinations, most                 recent 11/18/2022. CHF and Cardiomyopathy, CAD and Previous                 Myocardial Infarction, Abnormal ECG, PAD, Signs/Symptoms:Chest                 Pain and Edema; Risk Factors:Sleep Apnea, Former Smoker,                 Dyslipidemia, Diabetes and Hypertension.  Sonographer:    Ilda Mori MHA, BS, RDCS Referring Phys: [id number] CADENCE H FURTH  Sonographer Comments: Suboptimal apical window and patient is obese. Image acquisition challenging due to patient body habitus. IMPRESSIONS  1. Left ventricular ejection fraction, by estimation, is 45 to 50%. The left ventricle has moderately decreased function. The left ventricle demonstrates global hypokinesis. Left ventricular diastolic parameters are consistent with Grade I diastolic dysfunction (impaired relaxation).  2. Right ventricular systolic function  is normal. The right ventricular size is normal.  3. The mitral valve is normal in structure. Mild mitral valve regurgitation. No evidence of mitral stenosis.  4. The aortic valve is calcified. There is moderate calcification of the aortic valve. Aortic valve regurgitation is not visualized. Mild aortic valve stenosis. Aortic valve mean gradient measures 13.0 mmHg.  5. The inferior vena cava is normal in size with greater than 50% respiratory variability, suggesting right atrial pressure of 3 mmHg. FINDINGS  Left Ventricle: Left ventricular ejection fraction, by estimation, is 45 to 50%. The left ventricle has moderately decreased function. The left ventricle demonstrates global hypokinesis. Definity contrast agent was given IV to delineate the left ventricular endocardial borders. 3D ejection fraction reviewed and evaluated as part of the interpretation. Alternate measurement of EF is felt to be most reflective of LV function. There is no left ventricular hypertrophy. Abnormal (paradoxical) septal motion, consistent with left bundle branch block. Left ventricular diastolic parameters are consistent with Grade I diastolic dysfunction (impaired relaxation). Right Ventricle: The right ventricular size is normal. No increase in right ventricular wall thickness. Right ventricular systolic function is normal. Left Atrium: Left atrial size was normal in size. Right Atrium: Right atrial size was normal in size. Pericardium: There is no evidence of pericardial effusion. Mitral  Valve: The mitral valve is normal in structure. Mild mitral valve regurgitation. No evidence of mitral valve stenosis. Tricuspid Valve: The tricuspid valve is normal in structure. Tricuspid valve regurgitation is not demonstrated. No evidence of tricuspid stenosis. Aortic Valve: The aortic valve is calcified. There is moderate calcification of the aortic valve. Aortic valve regurgitation is not visualized. Mild aortic stenosis is present. Aortic valve  mean gradient measures 13.0 mmHg. Aortic valve peak gradient measures 20.8 mmHg. Pulmonic Valve: The pulmonic valve was normal in structure. Pulmonic valve regurgitation is not visualized. No evidence of pulmonic stenosis. Aorta: The aortic root is normal in size and structure. Venous: The inferior vena cava is normal in size with greater than 50% respiratory variability, suggesting right atrial pressure of 3 mmHg. IAS/Shunts: No atrial level shunt detected by color flow Doppler.  Diastology LV e' medial:    3.26 cm/s LV E/e' medial:  17.9 LV e' lateral:   5.55 cm/s LV E/e' lateral: 10.5  AORTIC VALVE AV Vmax:           228.00 cm/s AV Vmean:          171.000 cm/s AV VTI:            0.537 m AV Peak Grad:      20.8 mmHg AV Mean Grad:      13.0 mmHg LVOT Vmax:         72.80 cm/s LVOT Vmean:        48.200 cm/s LVOT VTI:          0.138 m LVOT/AV VTI ratio: 0.26 MITRAL VALVE MV Area (PHT): 2.73 cm    SHUNTS MV Decel Time: 278 msec    Systemic VTI: 0.14 m MV E velocity: 58.20 cm/s MV A velocity: 91.70 cm/s MV E/A ratio:  0.63 Julien Nordmann MD Electronically signed by Julien Nordmann MD Signature Date/Time: 01/30/2023/6:13:44 PM    Final     Labs:  CBC: Recent Labs    02/09/23 1958 02/10/23 0451 02/10/23 1447 02/10/23 1709 02/11/23 0230 02/11/23 0438  WBC 20.9* 17.4*  --  15.5*  --  17.4*  HGB 11.2* 9.1* 8.4* 8.1* 8.0* 7.9*  HCT 33.1* 26.3*  --  23.8* 22.7* 23.2*  PLT 339 305  --  277  --  215    COAGS: Recent Labs    11/17/22 1812  INR 1.1  APTT 25    BMP: Recent Labs    11/17/22 1646 11/21/22 0635 11/30/22 1555 12/21/22 1032 02/08/23 1332 02/09/23 0607  NA 136 135 141 139 136 139  K 3.9 3.6 5.5* 4.8 4.2 4.5  CL 103 102 105 102 104 108  CO2 24 22 21 30  20* 22  GLUCOSE 143* 134* 140* 109* 231* 148*  BUN 17 15 18 13 22 20   CALCIUM 8.6* 8.6* 9.7 9.6 8.5* 8.3*  CREATININE 0.81 0.74 1.10* 0.95 1.02* 0.79  GFRNONAA >60 >60  --   --  56* >60    LIVER FUNCTION TESTS: Recent Labs     06/15/22 0825 11/13/22 0845 11/17/22 1002 12/21/22 1032  BILITOT 1.1 1.1 1.5* 1.3*  AST 27 39* 26 19  ALT 27 28 20 17   ALKPHOS 74 78 76 65  PROT 7.9 6.9 7.1 6.9  ALBUMIN 4.2 4.0 3.9 4.0    TUMOR MARKERS: No results for input(s): "AFPTM", "CEA", "CA199", "CHROMGRNA" in the last 8760 hours.  Assessment and Plan:  My impression is that this patient has an intermittent acute arterial extravasation into the  hepatic flexure of the colon. She is appropriate candidate for catheter angiogram and possible embolization. Discussed with pt, spouse, and daughter the procedure, risks, benefits, alternatives. She is agreeable to proceed.   Thank you for this interesting consult.  I greatly enjoyed meeting SHAHIDA SCHNACKENBERG and look forward to participating in their care.  A copy of this report was sent to the requesting provider on this date.  Electronically Signed: Durwin Glaze, MD 02/11/2023, 5:12 PM   I spent a total of 40 Minutes   in face to face in clinical consultation, greater than 50% of which was counseling/coordinating care for acute lower GI bleed.

## 2023-02-11 NOTE — Progress Notes (Signed)
  Chaplain On-Call responded to Rapid Response notification at 1503 hours.  Chaplain met A/C Elnita Maxwell and patient's family members in the hallway as the patient was being transported to the ICU.  Chaplain accompanied family to ICU Waiting Room, including husband Rosanne Ashing and three other family members.  Chaplain provided spiritual and emotional support, and assured family of availability as needed.  Chaplain Morene Crocker., [facility]

## 2023-02-11 NOTE — Progress Notes (Signed)
IV consult received.  Secure chat with [facility] RN states blood needed for scan.  Referred RN to lab for lab draws.

## 2023-02-11 NOTE — Progress Notes (Signed)
Patient Name: [name] Date of Encounter: 02/11/2023  [facility] Cardiologist: Lorine Bears, MD   Interval Summary  .    Patient seen on a.m. rounds.  Denies any chest pain or shortness of breath.  Endorses weakness.  Feels defeated after having scans done that revealed no active GI bleeding.  She is concerned as she had several bloody bowel movements last evening as well as 1 black tarry stool.  Hemoglobin this morning of 7.9.  Husband remains at the bedside.  Vital Signs .    Vitals:   02/11/23 0220 02/11/23 0225 02/11/23 0227 02/11/23 0404  BP: (!) 79/61 121/70 (!) 124/45 103/75  Pulse:  93  78  Resp:    18  Temp:  98 F (36.7 C)  98.3 F (36.8 C)  TempSrc:    Oral  SpO2:    97%  Weight:      Height:        Intake/Output Summary (Last 24 hours) at 02/11/2023 0753 Last data filed at 02/10/2023 2058 Gross per 24 hour  Intake 266 ml  Output --  Net 266 ml      02/09/2023    7:41 PM 02/08/2023    1:33 PM 02/02/2023    1:21 PM  Last 3 Weights  Weight (lbs) 200 lb 6.4 oz 194 lb 0.1 oz 194 lb 3.2 oz  Weight (kg) 90.9 kg 88 kg 88.089 kg      Telemetry/ECG    Sinus rhythm at rates of 60 and 70 with unifocal PVCs occasional 3-4 beat runs of nonsustained V. tach noted throughout the night- Personally Reviewed  Physical Exam .   GEN: No acute distress.   Neck: No JVD Cardiac: RRR, no murmurs, rubs, or gallops.  Respiratory: Clear to auscultation bilaterally. GI: Soft, nontender, non-distended  MS: No edema  Assessment & Plan .     Diverticulitis/GI bleed -Presented 1/23 with 1 day of BRBPR x 3 with associated syncope -She was on DAPT with Brilinta and aspirin for recent PCI/DES placement x 3 in 11/2022 -CT of the abdomen showed evidence of diverticulitis with no evidence of active GI bleed, WBCs were 21.1 -Brilinta was held due to drop in hemoglobin and she was continued on aspirin -Continued on antibiotics per IM -CT angio for GI bleed showed acute  inflammation over the right colon near the hepatic flexure findings are compatible with acute diverticulitis, no evidence of active GI bleeding -Clear medicine scan for GI blood loss was negative with no evidence of active GI bleeding -Patient continues to have BRBPR overnight and this morning -Hemoglobin 7.9 -Transfuse 1 unit of PRBCs thus far -Recommend keeping hemoglobin greater than equal to 8 -Continue to trend CBC -Continued management per IM and GI  Coronary artery disease status post PCI 11/2022 -Left heart catheterization 11//24 showed diffuse moderately calcified arteries with severe two-vessel CAD.  She underwent successful bifurcation angioplasty with kissing stent placement to OM1/mid left circumflex using 2 DES.  12/11/2018 for staged PCI of the RCA with widely patent left circumflex and mildly elevated LVEDP she was recommended to continue with DAPT with aspirin and Brilinta for 12 months -Brilinta is being held due to worsening hemoglobin -She has been continued on aspirin and carvedilol -Can consider changing Brilinta to clopidogrel once acute stage of diverticulitis and GI bleed has resolved  Hyperlipidemia -Longstanding history of intolerance to statins -She has failed atorvastatin and rosuvastatin -Consider PCSK9 inhibitor or inclisiran as outpatient  For questions or updates, please  contact [facility] Please consult [email or web address] for contact info under        Signed, Yuvaan Olander, NP

## 2023-02-12 ENCOUNTER — Other Ambulatory Visit: Payer: Self-pay

## 2023-02-12 DIAGNOSIS — K5733 Diverticulitis of large intestine without perforation or abscess with bleeding: Secondary | ICD-10-CM | POA: Diagnosis not present

## 2023-02-12 DIAGNOSIS — I25118 Atherosclerotic heart disease of native coronary artery with other forms of angina pectoris: Secondary | ICD-10-CM | POA: Diagnosis not present

## 2023-02-12 DIAGNOSIS — I5022 Chronic systolic (congestive) heart failure: Secondary | ICD-10-CM | POA: Diagnosis not present

## 2023-02-12 DIAGNOSIS — K922 Gastrointestinal hemorrhage, unspecified: Secondary | ICD-10-CM | POA: Diagnosis not present

## 2023-02-12 LAB — CBC
HCT: 24 % — ABNORMAL LOW (ref 36.0–46.0)
Hemoglobin: 8.4 g/dL — ABNORMAL LOW (ref 12.0–15.0)
MCH: 29.1 pg (ref 26.0–34.0)
MCHC: 35 g/dL (ref 30.0–36.0)
MCV: 83 fL (ref 80.0–100.0)
Platelets: 193 10*3/uL (ref 150–400)
RBC: 2.89 MIL/uL — ABNORMAL LOW (ref 3.87–5.11)
RDW: 15.6 % — ABNORMAL HIGH (ref 11.5–15.5)
WBC: 19.6 10*3/uL — ABNORMAL HIGH (ref 4.0–10.5)
nRBC: 0.3 % — ABNORMAL HIGH (ref 0.0–0.2)

## 2023-02-12 LAB — BPAM RBC
Blood Product Expiration Date: 202502112359
Blood Product Expiration Date: 202502112359
Blood Product Expiration Date: 202502162359
Blood Product Expiration Date: 202502172359
ISSUE DATE / TIME: 202501251840
ISSUE DATE / TIME: 202501261455
ISSUE DATE / TIME: 202501261544
ISSUE DATE / TIME: 202501261544
Unit Type and Rh: 5100
Unit Type and Rh: 5100
Unit Type and Rh: 7300
Unit Type and Rh: 7300

## 2023-02-12 LAB — TYPE AND SCREEN
ABO/RH(D): B POS
Antibody Screen: NEGATIVE
Unit division: 0
Unit division: 0
Unit division: 0
Unit division: 0

## 2023-02-12 LAB — RENAL FUNCTION PANEL
Albumin: 2.7 g/dL — ABNORMAL LOW (ref 3.5–5.0)
Anion gap: 10 (ref 5–15)
BUN: 22 mg/dL (ref 8–23)
CO2: 22 mmol/L (ref 22–32)
Calcium: 7.8 mg/dL — ABNORMAL LOW (ref 8.9–10.3)
Chloride: 107 mmol/L (ref 98–111)
Creatinine, Ser: 0.69 mg/dL (ref 0.44–1.00)
GFR, Estimated: >60 mL/min (ref >60)
Glucose, Bld: 108 mg/dL — ABNORMAL HIGH (ref 70–99)
Phosphorus: 3.6 mg/dL (ref 2.5–4.6)
Potassium: 3.8 mmol/L (ref 3.5–5.1)
Sodium: 139 mmol/L (ref 135–145)

## 2023-02-12 LAB — HEMOGLOBIN AND HEMATOCRIT, BLOOD
HCT: 23.9 % — ABNORMAL LOW (ref 36.0–46.0)
HCT: 24.7 % — ABNORMAL LOW (ref 36.0–46.0)
HCT: 22.5 % — ABNORMAL LOW (ref 36.0–46.0)
Hemoglobin: 8.3 g/dL — ABNORMAL LOW (ref 12.0–15.0)
Hemoglobin: 8.5 g/dL — ABNORMAL LOW (ref 12.0–15.0)
Hemoglobin: 7.8 g/dL — ABNORMAL LOW (ref 12.0–15.0)

## 2023-02-12 LAB — LACTIC ACID, PLASMA: Lactic Acid, Venous: 1.1 mmol/L (ref 0.5–1.9)

## 2023-02-12 LAB — PREPARE RBC (CROSSMATCH)

## 2023-02-12 LAB — TROPONIN I (HIGH SENSITIVITY): Troponin I (High Sensitivity): 8 ng/L (ref <18)

## 2023-02-12 LAB — GLUCOSE, CAPILLARY: Glucose-Capillary: 109 mg/dL — ABNORMAL HIGH (ref 70–99)

## 2023-02-12 LAB — MAGNESIUM: Magnesium: 2.4 mg/dL (ref 1.7–2.4)

## 2023-02-12 MED ORDER — SODIUM CHLORIDE 0.9% FLUSH: 10.0000 mL | INTRAVENOUS | Status: DC | PRN

## 2023-02-12 MED ORDER — SODIUM CHLORIDE 0.9% FLUSH
10.0000 mL | Freq: Two times a day (BID) | INTRAVENOUS | Status: DC
Start: 2023-02-12 — End: 2023-02-16
  Administered 2023-02-12 – 2023-02-16 (×8): 10 mL

## 2023-02-12 MED ORDER — SALINE SPRAY 0.65 % NA SOLN: 1.0000 | NASAL | Status: DC | PRN

## 2023-02-12 MED ORDER — ORAL CARE MOUTH RINSE: 15.0000 mL | OROMUCOSAL | Status: DC | PRN

## 2023-02-12 NOTE — Assessment & Plan Note (Signed)
Pressure currently within goal. -Holding home losartan for concern of GI bleed

## 2023-02-12 NOTE — Progress Notes (Signed)
Rounding Note    Patient Name: [name] Date of Encounter: 02/12/2023  [facility] Cardiologist: Lorine Bears, MD   Subjective   Patient reports that she has been experiencing some lightheadedness since getting up to use the commode this morning. She did have a moderate amount of blood in her stool this morning. Hgb 8.3 today.   Inpatient Medications    Scheduled Meds:  sodium chloride   Intravenous Once   aspirin EC  81 mg Oral Daily   Chlorhexidine Gluconate Cloth  6 each Topical Daily   Fe Fum-Vit C-Vit B12-FA  1 capsule Oral BID   gabapentin  200 mg Oral TID   latanoprost  1 drop Both Eyes QHS   pantoprazole (PROTONIX) IV  40 mg Intravenous Q12H   pantoprazole (PROTONIX) IV  80 mg Intravenous Once   sodium chloride flush  3 mL Intravenous Q12H   Continuous Infusions:  piperacillin-tazobactam (ZOSYN)  IV Stopped (02/12/23 0323)   PRN Meds: acetaminophen **OR** acetaminophen, ALPRAZolam, iohexol, ondansetron **OR** ondansetron (ZOFRAN) IV, mouth rinse, polyethylene glycol   Vital Signs    Vitals:   02/12/23 0400 02/12/23 0500 02/12/23 0600 02/12/23 0630  BP: (!) 130/48 (!) 107/55 (!) 124/52 (!) 123/57  Pulse: 74 79 78 85  Resp: (!) 9 (!) 7 (!) 6 15  Temp: 98 F (36.7 C)     TempSrc: Oral     SpO2: 100% 98% 96% 99%  Weight:      Height:        Intake/Output Summary (Last 24 hours) at 02/12/2023 0824 Last data filed at 02/12/2023 0400 Gross per 24 hour  Intake 1641.24 ml  Output 500 ml  Net 1141.24 ml      02/11/2023    3:24 PM 02/09/2023    7:41 PM 02/08/2023    1:33 PM  Last 3 Weights  Weight (lbs) 204 lb 2.3 oz 200 lb 6.4 oz 194 lb 0.1 oz  Weight (kg) 92.6 kg 90.9 kg 88 kg      Telemetry    Sinus rhythm with frequent PACs and multifocal PVCs, rate 70-100 bpm - Personally Reviewed  Physical Exam   GEN: No acute distress.   Neck: No JVD Cardiac: RRR, no murmurs, rubs, or gallops.  Respiratory: Clear to auscultation  bilaterally. GI: Soft, nontender, non-distended  MS: No edema; No deformity. Neuro:  Nonfocal  Psych: Normal affect   Labs    High Sensitivity Troponin:   Recent Labs  Lab 02/11/23 2025 02/12/23 0302  TROPONINIHS 9 8     Chemistry Recent Labs  Lab 02/08/23 1332 02/09/23 0607 02/12/23 0302 02/12/23 0307  NA 136 139  --  139  K 4.2 4.5  --  3.8  CL 104 108  --  107  CO2 20* 22  --  22  GLUCOSE 231* 148*  --  108*  BUN 22 20  --  22  CREATININE 1.02* 0.79  --  0.69  CALCIUM 8.5* 8.3*  --  7.8*  MG  --   --  2.4  --   ALBUMIN  --   --   --  2.7*  GFRNONAA 56* >60  --  >60  ANIONGAP 12 9  --  10    Lipids No results for input(s): "CHOL", "TRIG", "HDL", "LABVLDL", "LDLCALC", "CHOLHDL" in the last 168 hours.  Hematology Recent Labs  Lab 02/10/23 1709 02/11/23 0230 02/11/23 0438 02/11/23 2025 02/12/23 0302  WBC 15.5*  --  17.4*  --  19.6*  RBC 2.72*  --  2.61*  --  2.89*  HGB 8.1*   < > 7.9* 8.9* 8.4*  HCT 23.8*   < > 23.2* 25.0* 24.0*  MCV 87.5  --  88.9  --  83.0  MCH 29.8  --  30.3  --  29.1  MCHC 34.0  --  34.1  --  35.0  RDW 13.2  --  13.6  --  15.6*  PLT 277  --  215  --  193   < > = values in this interval not displayed.   Thyroid No results for input(s): "TSH", "FREET4" in the last 168 hours.  BNP Recent Labs  Lab 02/08/23 1336  BNP 150.3*    DDimer No results for input(s): "DDIMER" in the last 168 hours.   Radiology    NM GI Blood Loss Result Date: 02/11/2023 CLINICAL DATA:  GI bleeding. EXAM: NUCLEAR MEDICINE GASTROINTESTINAL BLEEDING SCAN TECHNIQUE: Sequential abdominal images were obtained following intravenous administration of Tc-37m labeled red blood cells. RADIOPHARMACEUTICALS:  22.2 mCi Tc-24m pertechnetate in-vitro labeled red cells. COMPARISON:  02/09/2023 FINDINGS: Normal blood pool activity seen as well as activity within the bladder. Abnormal bowel activity is initially seen in the hepatic flexure of the colon on the 51-55 min image,  with subsequent progression throughout the transverse and descending colon. IMPRESSION: Active GI bleeding site identified in the hepatic flexure of the colon. Electronically Signed   By: Danae Orleans M.D.   On: 02/11/2023 14:43    Cardiac Studies   Echo limited 01/2023 1. Left ventricular ejection fraction, by estimation, is 45 to 50%. The  left ventricle has moderately decreased function. The left ventricle  demonstrates global hypokinesis. Left ventricular diastolic parameters are  consistent with Grade I diastolic  dysfunction (impaired relaxation).   2. Right ventricular systolic function is normal. The right ventricular  size is normal.   3. The mitral valve is normal in structure. Mild mitral valve  regurgitation. No evidence of mitral stenosis.   4. The aortic valve is calcified. There is moderate calcification of the  aortic valve. Aortic valve regurgitation is not visualized. Mild aortic  valve stenosis. Aortic valve mean gradient measures 13.0 mmHg.   5. The inferior vena cava is normal in size with greater than 50%  respiratory variability, suggesting right atrial pressure of 3 mmHg.    LHC staged PCI 12/11/22   Ost LM lesion is 30% stenosed.   Prox LAD to Mid LAD lesion is 30% stenosed.   Mid LAD lesion is 40% stenosed.   Mid LAD to Dist LAD lesion is 30% stenosed.   Prox RCA to Mid RCA lesion is 30% stenosed.   Dist RCA lesion is 70% stenosed with 90% stenosed side branch in RPDA.   1st Diag lesion is 50% stenosed.   Ost RCA to Prox RCA lesion is 50% stenosed.   RPAV lesion is 40% stenosed.   Non-stenotic Prox Cx to Mid Cx lesion was previously treated.   Non-stenotic 1st Mrg lesion was previously treated.   A drug-eluting stent was successfully placed using a STENT ONYX FRONTIER 2.5X22.   Post intervention, there is a 0% residual stenosis.   Post intervention, the side branch was reduced to 0% residual stenosis. 1.  Widely patent left circumflex stent with no  significant restenosis.  Severe distal RCA stenosis extending into the right PDA.  Moderate ostial RCA stenosis with pressure dampening with guide catheter engagement.  However, there was no pressure dampening  with diagnostic catheter engagement during most recent angiography. 2.  Left ventricular angiography was not performed.  Mildly elevated left ventricular end-diastolic pressure at 18 mmHg. 3.  Successful angioplasty and drug-eluting stent placement to the distal right coronary artery extending into the right PDA.   LHC 11/20/23   Prox RCA lesion is 30% stenosed.   Dist RCA lesion is 70% stenosed with 90% stenosed side branch in RPDA.   Prox LAD to Mid LAD lesion is 30% stenosed.   1st Diag lesion is 50% stenosed.   Mid LAD lesion is 40% stenosed.   Mid LAD to Dist LAD lesion is 30% stenosed.   Ost LM lesion is 30% stenosed.   1st Mrg lesion is 99% stenosed.   Prox Cx to Mid Cx lesion is 90% stenosed.   A drug-eluting stent was successfully placed using a STENT ONYX FRONTIER 2.5X26.   A drug-eluting stent was successfully placed using a STENT ONYX FRONTIER 2.5X22.   Post intervention, there is a 0% residual stenosis.   Post intervention, there is a 0% residual stenosis. 1.  Diffuse and moderately calcified coronary arteries with severe two-vessel coronary artery disease.  The culprit for myocardial infarction seems to be plaque rupture and OM1 and mid left circumflex.  In addition, there is severe stenosis in the distal RCA extending into the ostium of the right PDA.  The LAD has moderate disease in multiple areas. 2.  Left ventricular angiography was not performed.  EF was moderately reduced by echo.  LVEDP was mildly elevated. 3.  Successful complex bifurcation angioplasty and kissing stent placement to OM1/mid left circumflex using 2 drug-eluting stents.  Patient Profile     IVYROSE HASHMAN is a 78 y.o. 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   Assessment & Plan    Diverticulitis GI bleed - Presented 1/23 with 1 day of BRBPR x 3 with associated syncope - She was on DAPT with Brilinta and aspirin for recent PCI/DES placement x 3 in 11/2022 - CT of the abdomen showed evidence of diverticulitis with no evidence of active GI bleed, WBCs were 21.1 - Brilinta was held and she was continued on aspirin - Underwent tagged RBC scan 1/26 which showed active colonic bleeding from the right hepatic flexure. Patient went into hemorrhagic shock with systolic BP 50 mmHg, large amount of bleeding per rectum. Transferred to ICU with rapid response called. Received emergent blood transfusion. - IR took patient for emergent embolization, did not see active bleeding at the time of angiography, and therefore no intervention was done - Patient continues to have bloody stools today. Hgb 8.3. Will continue to hold Brilinta and continue ASA.  - Management per GI and IM  CAD s/p PCI 11/2022 - Left heart catheterization 11//24 showed diffuse moderately calcified arteries with severe two-vessel CAD. She underwent successful bifurcation angioplasty with kissing stent placement to OM1/mid left circumflex using 2 DES. 12/11/2018 staged PCI of the RCA with widely patent left circumflex and mildly elevated LVEDP she was recommended to continue with DAPT with aspirin and Brilinta for 12 months  - Brilinta held due to worsening hgb and active bleeding - Continued on ASA and carvedilol - Recommend genetic testing for susceptibility to clopidogrel with consideration of switching from ticagrelor to  clopidogrel once patient is stable - Recommend keeping hgb > 8  Hyperlipidemia - Longstanding history of statin intolerance, failed atorvastatin and  rosuvastatin - Consider PCSK9i or inclisiran as outpatient  For questions or updates, please contact [facility] Please consult [email or web address] for contact info under        Signed, Orion Crook, PA-C  02/12/2023, 8:24 AM

## 2023-02-12 NOTE — Progress Notes (Signed)
Progress Note   Patient: [name] ZOX:[id number] DOB: [birth year] DOA: 02/08/2023     4 DOS: the patient was seen and examined on 02/12/2023   Brief hospital course:  VERANIA SALBERG is a 78 y.o. 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   Patient had total of 3 episodes of bright red blood per rectum, she was on aspirin and Brilinta due to recent PCI done in November 2024.  On presentation hemodynamically stable, labs with leukocytosis at 20.2, hemoglobin 11.7, decreased from 14.3  56-month ago, CO2 of 20, blood glucose 231, BUN 22, creatinine 1.02. CT angio abdomen for GI protocol with concern of acute inflammation involving the right colon near hepatic flexure, most compatible with acute diverticulitis.  No evidence of active GI bleeding.  Also noted a small right adrenal adenoma and a small hiatal hernia.   Patient was started on Zosyn.  GI and cardiology are both consulted.  Cardiology recommended continue with aspirin and hold Brilinta for now.  Patient is high risk for restenosis of stents.  Gastroenterology recommended RBC tagged scan if bleeding persists.  1/24: Hemodynamically stable, hemoglobin at 10.6, slight worsening of leukocytosis at 21.1.  BNP of 150, lactic acid 3 >>3.8 Had another episode of small amount of bleeding per rectum, RBC tagged scan ordered.  Also giving some more gentle IV fluid  1/25: Patient continued to have bleeding per rectum, overnight 5 episodes recorded.  Hemoglobin decreased to 9.1>>8.4 today.  Worsening lactic acidosis after some improvement.  Continuing aspirin per cardiology and keep holding Brilinta.  RBC tagged scan was negative. Ordered 1 unit of PRBC.   1/26: Continue to bleed, hemoglobin was 7.9 this morning after getting 1 unit yesterday.  Patient  was sent for another RBC tagged scan, towards the end of the scan she felt little chest pressure and at the end she suddenly became hypotensive, cold and clammy, rapid response was called.  Patient was given emergent blood transfusion and 1 L of bolus and transferred to ICU.  RBC tagged scan came back positive for active bleeding just below hepatic flexure, IR is going to do embolization as she continued to have significant bleeding per rectum. Emergency protocol blood transfusion and FFP ordered. Blood pressure responding to IV fluid and transfusions. Ordered troponin, cardiology and GI are also aware.  PCCM on board  1/27: 1 episode of small amount of bleeding overnight and 1 dark-colored stool during the day.  Patient had her mesenteric angiography with IR yesterday evening, there was no active bleeding at that time, no embolization was done.  Patient with hemoglobin of 8.4 this morning s/p total of 4 unit of PRBC and 2 units of FFP. Cardiology is planning to do Brilinta monotherapy once bleeding resolved.  They also requested genotype testing for clopidogrel with the plan to transition her from ticagrelor to clopidogrel once results are back and if she is response.  Losing most of her IV access and a very hard stick, eventually agreed to place a PICC line which was ordered.   Assessment and Plan: * Acute GI bleeding Diverticulitis.  CTA abdomen with concern of diverticulitis, no active bleeding.  Patient was on DAPT with aspirin and Brilinta due to placement of 3 recent cardiac stents. Current hemoglobin of 7.9, it was 14.32 months ago. After discussing with  cardiology we will continue with aspirin and hold Brilinta for now. Patient continued to have significant bleeding, repeat tagged RBC scan shows active colonic bleeding from right hepatic flexure, towards the end of scan patient went into hemorrhagic shock, with systolic in 50s, cold and crampy and having a large amount of bleeding per rectum.   Transferred to ICU Rapid response was called. 1/27: Currently stable with much improved bleeding, angiography with IR did not show any actively bleeding vessel so no embolization was done. S/p total of 4 unit of PRBC and 2 unit of FFP -Continue with Zosyn -Monitor hemoglobin -Transfuse if below 8  Coronary artery disease Patient has 3 recent stents placement, 2 on 11/4 and 1 on 11/2522 by cardiology and was started on aspirin and Brilinta. Tough situation as she will be high risk for restenosis. Discussed with cardiology and they are advising continue aspirin for now and hold Brilinta which might be switched to Plavix in a day or 2 Mild chest pressure during scan today when her blood pressure dropped. -Repeating troponin -Cardiology is aware -Continue home carvedilol -Continue with aspirin -Holding Brilinta  Lactic acidosis Worsening lactic acidosis, likely due to GI bleed and diverticulitis. -Currently resolved  Diabetes mellitus (HCC) Check A1c -SSI  Chronic HFrEF (heart failure with reduced ejection fraction) (HCC) Echocardiogram done on 01/31/2023 with EF of 45 to 50% and grade 1 diastolic dysfunction. -Continue with carvedilol -Monitor volume status-patient received quite a bit of fluid due to developing hemorrhagic shock yesterday.   Essential hypertension Pressure currently within goal. -Holding home losartan for concern of GI bleed  OSA on CPAP -Continue CPAP at night   Subjective: Patient was feeling much improved when seen today.  Had 1 small episode of bleeding overnight, 1 dark color stool around lunch today.  Physical Exam: Vitals:   02/12/23 1000 02/12/23 1100 02/12/23 1145 02/12/23 1200  BP: 132/69 (!) 131/90  (!) 124/54  Pulse: 77 90 77 77  Resp: 10 (!) 21 15 14   Temp:      TempSrc:      SpO2: 98% 99% 98% 99%  Weight:      Height:       General.  Overweight elderly lady, in no acute distress. Pulmonary.  Lungs clear bilaterally, normal  respiratory effort. CV.  Regular rate and rhythm, no JVD, rub or murmur. Abdomen.  Soft, nontender, nondistended, BS positive. CNS.  Alert and oriented .  No focal neurologic deficit. Extremities.  No edema, no cyanosis, pulses intact and symmetrical. Psychiatry.  Judgment and insight appears normal.    Data Reviewed: Prior data reviewed  Family Communication: Discussed with husband and daughter at bedside  Disposition: Status is: Inpatient Remains inpatient appropriate because: Severity of illness  Planned Discharge Destination: Home  Time spent: 50 minutes  This record has been created using Conservation officer, historic buildings. Errors have been sought and corrected,but may not always be located. Such creation errors do not reflect on the standard of care.   Author: Arnetha Courser, MD 02/12/2023 1:57 PM  For on call review [email or web address].

## 2023-02-12 NOTE — Progress Notes (Addendum)
0800 States she feels better today. Complains of pain at old right arm IV site. Ice applied. 0900 Used bed pan because she was dizzy. Voided small amount into bedpan. 1200 Up to [facility] with help. Patient stated she felt dizzy. B/P 76/67 briefly and then returned to normal. Patient tolerated change in b/p.  1300 Resting quietly. No complaints. 1400 Awaiting IV team for PICC line placement.  1520 PICC line placed per IV team. Patient does not want peripheral line in left hand discontinued. 1600 Up to [facility] without issues. No BM with urine. 1800 Patient states she is tired and weak/exhausted. Encouraged to rest and stay calm. No bed available on PCU yet.

## 2023-02-12 NOTE — Progress Notes (Signed)
   02/12/23 1050  Spiritual Encounters  Type of Visit Follow up  Care provided to: Pt and family  Conversation partners present during encounter Nurse  Referral source Chaplain team  Reason for visit Routine spiritual support  OnCall Visit No  Spiritual Framework  Presenting Themes Meaning/purpose/sources of inspiration;Values and beliefs;Impactful experiences and emotions;Courage hope and growth;Coping tools  Values/beliefs Pt stated she is a [place]; prays in [place]' name  Family Stress Factors Other (Comment) (Daughter at bedside; Dad taking a break in [place])  Interventions  Spiritual Care Interventions Made Established relationship of care and support;Compassionate presence;Reflective listening;Narrative/life review;Explored values/beliefs/practices/strengths;Prayer;Meaning making  Intervention Outcomes  Outcomes Connection to spiritual care;Awareness around self/spiritual resourses;Connection to values and goals of care;Awareness of support;Reduced anxiety

## 2023-02-12 NOTE — Progress Notes (Signed)
Peripherally Inserted Central Catheter Placement  The IV Nurse has discussed with the patient and/or persons authorized to consent for the patient, the purpose of this procedure and the potential benefits and risks involved with this procedure.  The benefits include less needle sticks, lab draws from the catheter, and the patient may be discharged home with the catheter. Risks include, but not limited to, infection, bleeding, blood clot (thrombus formation), and puncture of an artery; nerve damage and irregular heartbeat and possibility to perform a PICC exchange if needed/ordered by physician.  Alternatives to this procedure were also discussed.  Bard Power PICC patient education guide, fact sheet on infection prevention and patient information card has been provided to patient /or left at bedside.  ECG printed and placed in patient's chart.   PICC Placement Documentation  PICC Double Lumen 02/12/23 Right Cephalic 42 cm 1 cm (Active)  Indication for Insertion or Continuance of Line Limited venous access - need for IV therapy >5 days (PICC only) 02/12/23 1532  Exposed Catheter (cm) 1 cm 02/12/23 1532  Site Assessment Clean, Dry, Intact 02/12/23 1532  Lumen #1 Status Flushed;Saline locked;Blood return noted 02/12/23 1532  Lumen #2 Status Flushed;Saline locked;Blood return noted 02/12/23 1532  Dressing Type Transparent;Securing device 02/12/23 1532  Dressing Status Antimicrobial disc/dressing in place 02/12/23 1532  Line Care Connections checked and tightened 02/12/23 1532  Line Adjustment (NICU/IV Team Only) No 02/12/23 1532  Dressing Intervention New dressing;Adhesive placed at insertion site (IV team only) 02/12/23 1532  Dressing Change Due 02/19/23 02/12/23 1532       Elenore Paddy 02/12/2023, 3:33 PM

## 2023-02-12 NOTE — Progress Notes (Signed)
Came by the ICU today to see the patient who states she is feeling well except a little lightheaded.  The patient was followed over the weekend by Dr. Tobi Bastos and he reiterated what I have told the patient and the patient's family about the risks of proceeding with any endoscopic luminal evaluation of the colon with a high risk of perforation with her active diverticulitis.  The patient did have a positive bleeding scan with a resulting angiography by interventional radiology.  There is no bleeding seen at that time and it is my understanding that nothing was done due to no sign of active bleeding.  The patient denies any abdominal pain at the present time.  The patient has a GI bleed which appears to be diverticular at the site of her diverticulitis.  Continue to recommend vascular surgery versus interventional radiology for embolization when she is actively bleeding since luminal control of diverticular bleeding has a very low success rate and she is not a candidate for any luminal evaluation of her colon with the high risk of perforation due to diverticulitis.  The patient and her family have been explained this and agree with the plan.

## 2023-02-12 NOTE — Progress Notes (Signed)
Pt requesting to attempt [facility] again. Sat pt up @ 90 degrees in bed for a few minutes w/o any complaints of dizziness, lightheadedness, nausea, "wooziness". Pt then sat on edge of bed, dangling feet, for a few minutes w/o any complaints. Pt stood at bedside and only complaint was feeling generally fatigued, but no complaints of "wooziness". Pt pivoted to [facility]. Pt endorsed slight "wooziness" once sitting on the BSC. While NT was assisting pt with pericare, this nurse was standing in front of pt for support and started to note that pt was starting to sway. Pt then started complaining of worsening "wooziness". Pt sat on edge of bed and immediately started to lean forward saying "wooo". Assisted pt to laying position. BP noted to be 123/57 (74) once back in bed. Pt voiced improvement in symptoms after laying down. Reports did not feel clammy this time. Sat pt up in bed. Right groin site remains soft without bruising or hematoma. Bed in lowest position with bed alarms on and call bell within reach. Pt's husband remains at bedside.

## 2023-02-12 NOTE — Plan of Care (Signed)
  Problem: Education: Goal: Ability to describe self-care measures that may prevent or decrease complications (Diabetes Survival Skills Education) will improve Outcome: Progressing Goal: Individualized Educational Video(s) Outcome: Progressing   Problem: Coping: Goal: Ability to adjust to condition or change in health will improve Outcome: Progressing   Problem: Fluid Volume: Goal: Ability to maintain a balanced intake and output will improve Outcome: Progressing   Problem: Health Behavior/Discharge Planning: Goal: Ability to identify and utilize available resources and services will improve Outcome: Progressing Goal: Ability to manage health-related needs will improve Outcome: Progressing   Problem: Metabolic: Goal: Ability to maintain appropriate glucose levels will improve Outcome: Progressing   Problem: Nutritional: Goal: Maintenance of adequate nutrition will improve Outcome: Progressing Goal: Progress toward achieving an optimal weight will improve Outcome: Progressing   Problem: Skin Integrity: Goal: Risk for impaired skin integrity will decrease Outcome: Progressing   Problem: Tissue Perfusion: Goal: Adequacy of tissue perfusion will improve Outcome: Progressing   Problem: Education: Goal: Knowledge of General Education information will improve Description: Including pain rating scale, medication(s)/side effects and non-pharmacologic comfort measures Outcome: Progressing   Problem: Health Behavior/Discharge Planning: Goal: Ability to manage health-related needs will improve Outcome: Progressing   Problem: Clinical Measurements: Goal: Ability to maintain clinical measurements within normal limits will improve Outcome: Progressing Goal: Will remain free from infection Outcome: Progressing Goal: Diagnostic test results will improve Outcome: Progressing Goal: Respiratory complications will improve Outcome: Progressing Goal: Cardiovascular complication will  be avoided Outcome: Progressing   Problem: Activity: Goal: Risk for activity intolerance will decrease Outcome: Progressing   Problem: Nutrition: Goal: Adequate nutrition will be maintained Outcome: Progressing   Problem: Coping: Goal: Level of anxiety will decrease Outcome: Progressing   Problem: Elimination: Goal: Will not experience complications related to bowel motility Outcome: Progressing Goal: Will not experience complications related to urinary retention Outcome: Progressing   Problem: Pain Managment: Goal: General experience of comfort will improve and/or be controlled Outcome: Progressing   Problem: Safety: Goal: Ability to remain free from injury will improve Outcome: Progressing   Problem: Skin Integrity: Goal: Risk for impaired skin integrity will decrease Outcome: Progressing   Problem: Education: Goal: Understanding of CV disease, CV risk reduction, and recovery process will improve Outcome: Progressing Goal: Individualized Educational Video(s) Outcome: Progressing   Problem: Activity: Goal: Ability to return to baseline activity level will improve Outcome: Progressing   Problem: Cardiovascular: Goal: Ability to achieve and maintain adequate cardiovascular perfusion will improve Outcome: Progressing Goal: Vascular access site(s) Level 0-1 will be maintained Outcome: Progressing

## 2023-02-13 DIAGNOSIS — K922 Gastrointestinal hemorrhage, unspecified: Secondary | ICD-10-CM | POA: Diagnosis not present

## 2023-02-13 DIAGNOSIS — K5733 Diverticulitis of large intestine without perforation or abscess with bleeding: Secondary | ICD-10-CM | POA: Diagnosis not present

## 2023-02-13 DIAGNOSIS — I5022 Chronic systolic (congestive) heart failure: Secondary | ICD-10-CM | POA: Diagnosis not present

## 2023-02-13 DIAGNOSIS — I2510 Atherosclerotic heart disease of native coronary artery without angina pectoris: Secondary | ICD-10-CM

## 2023-02-13 LAB — RENAL FUNCTION PANEL
Albumin: 2.5 g/dL — ABNORMAL LOW (ref 3.5–5.0)
Anion gap: 8 (ref 5–15)
BUN: 15 mg/dL (ref 8–23)
CO2: 25 mmol/L (ref 22–32)
Calcium: 7.6 mg/dL — ABNORMAL LOW (ref 8.9–10.3)
Chloride: 108 mmol/L (ref 98–111)
Creatinine, Ser: 0.75 mg/dL (ref 0.44–1.00)
GFR, Estimated: >60 mL/min (ref >60)
Glucose, Bld: 123 mg/dL — ABNORMAL HIGH (ref 70–99)
Phosphorus: 3.8 mg/dL (ref 2.5–4.6)
Potassium: 3.5 mmol/L (ref 3.5–5.1)
Sodium: 141 mmol/L (ref 135–145)

## 2023-02-13 LAB — CBC
HCT: 21.1 % — ABNORMAL LOW (ref 36.0–46.0)
Hemoglobin: 7.3 g/dL — ABNORMAL LOW (ref 12.0–15.0)
MCH: 30.2 pg (ref 26.0–34.0)
MCHC: 34.6 g/dL (ref 30.0–36.0)
MCV: 87.2 fL (ref 80.0–100.0)
Platelets: 184 10*3/uL (ref 150–400)
RBC: 2.42 MIL/uL — ABNORMAL LOW (ref 3.87–5.11)
RDW: 15.6 % — ABNORMAL HIGH (ref 11.5–15.5)
WBC: 14.6 10*3/uL — ABNORMAL HIGH (ref 4.0–10.5)
nRBC: 0.2 % (ref 0.0–0.2)

## 2023-02-13 LAB — CULTURE, BLOOD (ROUTINE X 2)
Culture: NO GROWTH
Culture: NO GROWTH

## 2023-02-13 LAB — HEMOGLOBIN AND HEMATOCRIT, BLOOD
HCT: 21.1 % — ABNORMAL LOW (ref 36.0–46.0)
HCT: 31.9 % — ABNORMAL LOW (ref 36.0–46.0)
HCT: 32.8 % — ABNORMAL LOW (ref 36.0–46.0)
Hemoglobin: 11.4 g/dL — ABNORMAL LOW (ref 12.0–15.0)
Hemoglobin: 11.5 g/dL — ABNORMAL LOW (ref 12.0–15.0)
Hemoglobin: 7.2 g/dL — ABNORMAL LOW (ref 12.0–15.0)

## 2023-02-13 LAB — PREPARE RBC (CROSSMATCH)

## 2023-02-13 LAB — GLUCOSE, CAPILLARY: Glucose-Capillary: 99 mg/dL (ref 70–99)

## 2023-02-13 MED ORDER — SODIUM CHLORIDE 0.9% IV SOLUTION: Freq: Once | INTRAVENOUS | Status: AC

## 2023-02-13 NOTE — Progress Notes (Signed)
Rounding Note    Patient Name: [name] Date of Encounter: 02/13/2023  [facility] Cardiologist: Lorine Bears, MD   Subjective   Patient reports continued lightheadedness. She is upset today due to her condition not improving. Continued to have dark stools last night and this morning. Hgb 7.3 today.   Inpatient Medications    Scheduled Meds:  sodium chloride   Intravenous Once   aspirin EC  81 mg Oral Daily   Chlorhexidine Gluconate Cloth  6 each Topical Daily   Fe Fum-Vit C-Vit B12-FA  1 capsule Oral BID   gabapentin  200 mg Oral TID   latanoprost  1 drop Both Eyes QHS   pantoprazole (PROTONIX) IV  40 mg Intravenous Q12H   pantoprazole (PROTONIX) IV  80 mg Intravenous Once   sodium chloride flush  10-40 mL Intracatheter Q12H   sodium chloride flush  3 mL Intravenous Q12H   Continuous Infusions:  piperacillin-tazobactam (ZOSYN)  IV Stopped (02/13/23 0331)   PRN Meds: acetaminophen **OR** acetaminophen, ALPRAZolam, ondansetron **OR** ondansetron (ZOFRAN) IV, mouth rinse, polyethylene glycol, sodium chloride, sodium chloride flush   Vital Signs    Vitals:   02/13/23 0400 02/13/23 0500 02/13/23 0600 02/13/23 0800  BP: (!) 117/51 106/63 (!) 101/52 130/72  Pulse: 89 65 68 78  Resp: 18 (!) 8 18 16   Temp: 98.1 F (36.7 C)   98.2 F (36.8 C)  TempSrc: Oral   Oral  SpO2: 97% 98% 94% 98%  Weight:      Height:        Intake/Output Summary (Last 24 hours) at 02/13/2023 0811 Last data filed at 02/13/2023 0400 Gross per 24 hour  Intake 1041.16 ml  Output 1501 ml  Net -459.84 ml      02/11/2023    3:24 PM 02/09/2023    7:41 PM 02/08/2023    1:33 PM  Last 3 Weights  Weight (lbs) 204 lb 2.3 oz 200 lb 6.4 oz 194 lb 0.1 oz  Weight (kg) 92.6 kg 90.9 kg 88 kg      Telemetry    Sinus rhythm with multifocal PVCs, rate 70-100 bpm - Personally Reviewed  Physical Exam   GEN: No acute distress.   Neck: No JVD Cardiac: RRR, no murmurs, rubs, or gallops.   Respiratory: Clear to auscultation bilaterally. GI: Soft, nontender, non-distended  MS: No edema; No deformity. Neuro:  Nonfocal  Psych: Normal affect   Labs    High Sensitivity Troponin:   Recent Labs  Lab 02/11/23 2025 02/12/23 0302  TROPONINIHS 9 8     Chemistry Recent Labs  Lab 02/09/23 0607 02/12/23 0302 02/12/23 0307 02/13/23 0507  NA 139  --  139 141  K 4.5  --  3.8 3.5  CL 108  --  107 108  CO2 22  --  22 25  GLUCOSE 148*  --  108* 123*  BUN 20  --  22 15  CREATININE 0.79  --  0.69 0.75  CALCIUM 8.3*  --  7.8* 7.6*  MG  --  2.4  --   --   ALBUMIN  --   --  2.7* 2.5*  GFRNONAA >60  --  >60 >60  ANIONGAP 9  --  10 8    Lipids No results for input(s): "CHOL", "TRIG", "HDL", "LABVLDL", "LDLCALC", "CHOLHDL" in the last 168 hours.  Hematology Recent Labs  Lab 02/11/23 0438 02/11/23 2025 02/12/23 0302 02/12/23 0823 02/12/23 2004 02/13/23 0013 02/13/23 0507  WBC 17.4*  --  19.6*  --   --   --  14.6*  RBC 2.61*  --  2.89*  --   --   --  2.42*  HGB 7.9*   < > 8.4*   < > 7.8* 7.2* 7.3*  HCT 23.2*   < > 24.0*   < > 22.5* 21.1* 21.1*  MCV 88.9  --  83.0  --   --   --  87.2  MCH 30.3  --  29.1  --   --   --  30.2  MCHC 34.1  --  35.0  --   --   --  34.6  RDW 13.6  --  15.6*  --   --   --  15.6*  PLT 215  --  193  --   --   --  184   < > = values in this interval not displayed.   Thyroid No results for input(s): "TSH", "FREET4" in the last 168 hours.  BNP Recent Labs  Lab 02/08/23 1336  BNP 150.3*    DDimer No results for input(s): "DDIMER" in the last 168 hours.   Radiology    [place] EKG SITE RITE Result Date: 02/12/2023 If [facility] image not attached, placement could not be confirmed due to current cardiac rhythm.  IR Angiogram Visceral Selective Result Date: 02/12/2023 CLINICAL DATA:  GI bleed. Negative CTA abdomen. Positive tagged red blood cell scintigraphy demonstrating extravasation from the region of the hepatic flexure of the colon. EXAM:  SELECTIVE VISCERAL ARTERIOGRAPHY PROCEDURE: The procedure, risks, benefits, and alternatives were reviewed with the the patient and family. Questions regarding the procedure were encouraged and answered. The patient understands and consents to the procedure. The right femoral region was prepped with chlorhexidine in a sterile fashion, and a sterile drape was applied covering the operative field. A sterile gown and sterile gloves were used for the procedure. Local anesthesia was provided with 1% Lidocaine. Intravenous Fentanyl and Versed 1.5mg  were administered by RN during a total moderate (conscious) sedation time of 75 minutes; the patient's level of consciousness and physiological / cardiorespiratory status were monitored continuously by radiology RN under my direct supervision. After an appropriate skin entry site was confirmed under ultrasound, the right common femoral artery was accessed over the femoral head with a 21 gauge micropuncture needle using single-wall technique in a single pass under real time ultrasound guidance. Ultrasound documentation was stored. The needle was exchanged over a 018 wire for a transitional dilator allowing passage of a Benson wire over which a 5-French vascular sheath was placed, through which a C2 Catheter was advanced. The SMA was selectively catheterized for arteriography. A coaxial renegade micro catheter was then advanced for sub selective arteriography of 2 separate third order SMA branch territories supplying the hepatic flexure of the colon, ascending and transverse segments. Arteriograms performed with microcatheter in multiple projections. Subsequently, non selective SMA arteriography was performed in multiple projections. The catheter was then removed and hemostasis achieved at the site using the Exoseal device after confirmatory femoral arteriography. The patient tolerated the procedure well. ANESTHESIA/SEDATION: Intravenous Fentanyl and Versed 1.5mg  were  administered by RN during a total moderate (conscious) sedation time of 75 minutes; the patient's level of consciousness and physiological / cardiorespiratory status were monitored continuously by radiology RN under my direct supervision. FLUOROSCOPY TIME:  Radiation Exposure Index (as provided by the fluoroscopic device): 1972 mGy air Kerma MEDICATIONS: Lidocaine 1% subcutaneous CONTRAST:  92mL OMNIPAQUE IOHEXOL 300 MG/ML  SOLN FINDINGS:  Superior mesenteric arteriography demonstrates classic distal branch anatomy. Microcatheter sub selective arteriography of second and third order branches supplying the ascending and transverse segments of the colon demonstrate no extravasation or other focal lesion to suggest etiology of the hemorrhage noted on scintigraphy. Additional subsequent nonselective SMA arteriography in additional projections demonstrates no active extravasation or lesion corresponding to finding seen on scintigraphy. COMPLICATIONS: None immediate IMPRESSION: No active extravasation or other discrete lesion to localize bleeding source seen on scintigraphy. Embolization therefore not performed. Electronically Signed   By: Corlis Leak M.D.   On: 02/12/2023 09:11   NM GI Blood Loss Result Date: 02/11/2023 CLINICAL DATA:  GI bleeding. EXAM: NUCLEAR MEDICINE GASTROINTESTINAL BLEEDING SCAN TECHNIQUE: Sequential abdominal images were obtained following intravenous administration of Tc-78m labeled red blood cells. RADIOPHARMACEUTICALS:  22.2 mCi Tc-64m pertechnetate in-vitro labeled red cells. COMPARISON:  02/09/2023 FINDINGS: Normal blood pool activity seen as well as activity within the bladder. Abnormal bowel activity is initially seen in the hepatic flexure of the colon on the 51-55 min image, with subsequent progression throughout the transverse and descending colon. IMPRESSION: Active GI bleeding site identified in the hepatic flexure of the colon. Electronically Signed   By: Danae Orleans M.D.   On:  02/11/2023 14:43    Cardiac Studies   Echo limited 01/2023 1. Left ventricular ejection fraction, by estimation, is 45 to 50%. The  left ventricle has moderately decreased function. The left ventricle  demonstrates global hypokinesis. Left ventricular diastolic parameters are  consistent with Grade I diastolic  dysfunction (impaired relaxation).   2. Right ventricular systolic function is normal. The right ventricular  size is normal.   3. The mitral valve is normal in structure. Mild mitral valve  regurgitation. No evidence of mitral stenosis.   4. The aortic valve is calcified. There is moderate calcification of the  aortic valve. Aortic valve regurgitation is not visualized. Mild aortic  valve stenosis. Aortic valve mean gradient measures 13.0 mmHg.   5. The inferior vena cava is normal in size with greater than 50%  respiratory variability, suggesting right atrial pressure of 3 mmHg.    LHC staged PCI 12/11/22   Ost LM lesion is 30% stenosed.   Prox LAD to Mid LAD lesion is 30% stenosed.   Mid LAD lesion is 40% stenosed.   Mid LAD to Dist LAD lesion is 30% stenosed.   Prox RCA to Mid RCA lesion is 30% stenosed.   Dist RCA lesion is 70% stenosed with 90% stenosed side branch in RPDA.   1st Diag lesion is 50% stenosed.   Ost RCA to Prox RCA lesion is 50% stenosed.   RPAV lesion is 40% stenosed.   Non-stenotic Prox Cx to Mid Cx lesion was previously treated.   Non-stenotic 1st Mrg lesion was previously treated.   A drug-eluting stent was successfully placed using a STENT ONYX FRONTIER 2.5X22.   Post intervention, there is a 0% residual stenosis.   Post intervention, the side branch was reduced to 0% residual stenosis. 1.  Widely patent left circumflex stent with no significant restenosis.  Severe distal RCA stenosis extending into the right PDA.  Moderate ostial RCA stenosis with pressure dampening with guide catheter engagement.  However, there was no pressure dampening with  diagnostic catheter engagement during most recent angiography. 2.  Left ventricular angiography was not performed.  Mildly elevated left ventricular end-diastolic pressure at 18 mmHg. 3.  Successful angioplasty and drug-eluting stent placement to the distal right coronary artery extending into  the right PDA.   LHC 11/20/23   Prox RCA lesion is 30% stenosed.   Dist RCA lesion is 70% stenosed with 90% stenosed side branch in RPDA.   Prox LAD to Mid LAD lesion is 30% stenosed.   1st Diag lesion is 50% stenosed.   Mid LAD lesion is 40% stenosed.   Mid LAD to Dist LAD lesion is 30% stenosed.   Ost LM lesion is 30% stenosed.   1st Mrg lesion is 99% stenosed.   Prox Cx to Mid Cx lesion is 90% stenosed.   A drug-eluting stent was successfully placed using a STENT ONYX FRONTIER 2.5X26.   A drug-eluting stent was successfully placed using a STENT ONYX FRONTIER 2.5X22.   Post intervention, there is a 0% residual stenosis.   Post intervention, there is a 0% residual stenosis. 1.  Diffuse and moderately calcified coronary arteries with severe two-vessel coronary artery disease.  The culprit for myocardial infarction seems to be plaque rupture and OM1 and mid left circumflex.  In addition, there is severe stenosis in the distal RCA extending into the ostium of the right PDA.  The LAD has moderate disease in multiple areas. 2.  Left ventricular angiography was not performed.  EF was moderately reduced by echo.  LVEDP was mildly elevated. 3.  Successful complex bifurcation angioplasty and kissing stent placement to OM1/mid left circumflex using 2 drug-eluting stents.  Patient Profile     KABAO LEITE is a 78 y.o. 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    Assessment & Plan    Diverticulitis GI bleed - Presented 1/23 with 1 day of BRBPR x 3 with associated syncope - She was on DAPT with Brilinta and aspirin for recent PCI/DES placement x 3 in 11/2022 - CT of the abdomen showed evidence of diverticulitis with no evidence of active GI bleed, WBCs were 21.1 - Brilinta was held and she was continued on aspirin - Underwent tagged RBC scan 1/26 which showed active colonic bleeding from the right hepatic flexure. Patient went into hemorrhagic shock with systolic BP 50 mmHg, large amount of bleeding per rectum. Transferred to ICU with rapid response called. Received emergent blood transfusion. - IR took patient for emergent embolization, did not see active bleeding at the time of angiography, and therefore no intervention was done - Patient continues to have bloody stools today. Hgb 7.3. Will continue to hold Brilinta and continue ASA.  - Management per GI and IM  CAD s/p PCI 11/2022 - Left heart catheterization 11//24 showed diffuse moderately calcified arteries with severe two-vessel CAD. She underwent successful bifurcation angioplasty with kissing stent placement to OM1/mid left circumflex using 2 DES. 12/11/2018 staged PCI of the RCA with widely patent left circumflex and mildly elevated LVEDP she was recommended to continue with DAPT with aspirin and Brilinta for 12 months  - Brilinta held due to worsening hgb and active bleeding - Continued on ASA and carvedilol - Genetic testing for clopidogrel drawn, results pending - Plan once hgb is stabilized is to preferably start clopidogrel (depending on genetic testing results) and stop ASA - Recommend hgb > 8  Hyperlipidemia - Longstanding history of statin intolerance, failed atorvastatin and rosuvastatin - Consider PCSK9i or inclisiran as outpatient  For questions or updates, please contact  [facility] Please consult [email or web address] for contact info under        Signed, Orion Crook, PA-C  02/13/2023, 8:11 AM

## 2023-02-13 NOTE — Progress Notes (Signed)
   02/13/23 1045  Spiritual Encounters  Type of Visit Follow up  Care provided to: Pt and family (Husband at bedside)  Referral source Chaplain team  Reason for visit Routine spiritual support  OnCall Visit No  Spiritual Framework  Presenting Themes Meaning/purpose/sources of inspiration;Values and beliefs;Significant life change;Coping tools;Impactful experiences and emotions;Courage hope and growth  Patient Stress Factors Health changes;Loss of control  Family Stress Factors Other (Comment) (Pt has good family support and is grateful)  Interventions  Spiritual Care Interventions Made Compassionate presence;Reflective listening;Normalization of emotions;Narrative/life review;Explored values/beliefs/practices/strengths;Meaning making;Prayer;Encouragement  Intervention Outcomes  Outcomes Connection to spiritual care;Awareness around self/spiritual resourses;Awareness of support;Reduced anxiety;Reduced fear

## 2023-02-13 NOTE — Plan of Care (Signed)

## 2023-02-13 NOTE — Progress Notes (Signed)
Progress Note   Patient: [name]:[id number] DOB: 1945-12-15 DOA: 02/08/2023     5 DOS: the patient was seen and examined on 02/13/2023   Brief hospital course:  [name] is a 78 y.o. 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   Patient had total of 3 episodes of bright red blood per rectum, she was on aspirin and Brilinta due to recent PCI done in November 2024.  On presentation hemodynamically stable, labs with leukocytosis at 20.2, hemoglobin 11.7, decreased from 14.3  45-month ago, CO2 of 20, blood glucose 231, BUN 22, creatinine 1.02. CT angio abdomen for GI protocol with concern of acute inflammation involving the right colon near hepatic flexure, most compatible with acute diverticulitis.  No evidence of active GI bleeding.  Also noted a small right adrenal adenoma and a small hiatal hernia.   Patient was started on Zosyn.  GI and cardiology are both consulted.  Cardiology recommended continue with aspirin and hold Brilinta for now.  Patient is high risk for restenosis of stents.  Gastroenterology recommended RBC tagged scan if bleeding persists.  1/24: Hemodynamically stable, hemoglobin at 10.6, slight worsening of leukocytosis at 21.1.  BNP of 150, lactic acid 3 >>3.8 Had another episode of small amount of bleeding per rectum, RBC tagged scan ordered.  Also giving some more gentle IV fluid  1/25: Patient continued to have bleeding per rectum, overnight 5 episodes recorded.  Hemoglobin decreased to 9.1>>8.4 today.  Worsening lactic acidosis after some improvement.  Continuing aspirin per cardiology and keep holding Brilinta.  RBC tagged scan was negative. Ordered 1 unit of PRBC.   1/26: Continue to bleed, hemoglobin was 7.9 this morning after getting 1 unit yesterday.  Patient  was sent for another RBC tagged scan, towards the end of the scan she felt little chest pressure and at the end she suddenly became hypotensive, cold and clammy, rapid response was called.  Patient was given emergent blood transfusion and 1 L of bolus and transferred to ICU.  RBC tagged scan came back positive for active bleeding just below hepatic flexure, IR is going to do embolization as she continued to have significant bleeding per rectum. Emergency protocol blood transfusion and FFP ordered. Blood pressure responding to IV fluid and transfusions. Ordered troponin, cardiology and GI are also aware.  PCCM on board  1/27: 1 episode of small amount of bleeding overnight and 1 dark-colored stool during the day.  Patient had her mesenteric angiography with IR yesterday evening, there was no active bleeding at that time, no embolization was done.  Patient with hemoglobin of 8.4 this morning s/p total of 4 unit of PRBC and 2 units of FFP. Cardiology is planning to do Brilinta monotherapy once bleeding resolved.  They also requested genotype testing for clopidogrel with the plan to transition her from ticagrelor to clopidogrel once results are back and if she is response.  Losing most of her IV access and a very hard stick, eventually agreed to place a PICC line which was ordered.  1/28: Continue to have some dark-colored stool but no other active bleeding, hemoglobin at 7.3 this morning-ordered 2 more units of PRBC. Plan is to do 2 weeks of antibiotics for diverticulitis due to this persistent bleeding, we can switch to p.o. once bleeding resolved.   Assessment  and Plan: * Acute GI bleeding Diverticulitis.  CTA abdomen with concern of diverticulitis, no active bleeding.  Patient was on DAPT with aspirin and Brilinta due to placement of 3 recent cardiac stents. Current hemoglobin of 7.9, it was 14.32 months ago. After discussing with cardiology we will continue with aspirin and hold Brilinta for  now. Patient continued to have significant bleeding, repeat tagged RBC scan shows active colonic bleeding from right hepatic flexure, towards the end of scan patient went into hemorrhagic shock, with systolic in 50s, cold and crampy and having a large amount of bleeding per rectum.  Transferred to ICU Rapid response was called. 1/27: Currently stable with much improved bleeding, angiography with IR did not show any actively bleeding vessel so no embolization was done. S/p total of 4 unit of PRBC and 2 unit of FFP 1/28; dark-colored stool, hemoglobin at 7.3 so ordered 2 more units of PRBC. -Continue with Zosyn-will do 2 weeks of antibiotics, can switch to p.o. Augmentin once bleeding stopped. -Monitor hemoglobin -Transfuse if below 8  Coronary artery disease Patient has 3 recent stents placement, 2 on 11/4 and 1 on 11/2522 by cardiology and was started on aspirin and Brilinta. Tough situation as she will be high risk for restenosis. Discussed with cardiology and they are advising continue aspirin for now and hold Brilinta  Cardiology likely will do monotherapy with Brilinta once bleeding resolved -Continue home carvedilol -Continue with aspirin -Holding Brilinta  Lactic acidosis Now resolved.  Diabetes mellitus (HCC) Well-controlled, CBG within goal, A1c of 6.2 -SSI was discontinued as patient does not want to be checked very regularly and blood glucose remained within goal  Chronic HFrEF (heart failure with reduced ejection fraction) (HCC) Echocardiogram done on 01/31/2023 with EF of 45 to 50% and grade 1 diastolic dysfunction. -Continue with carvedilol -Monitor volume status-patient received quite a bit of fluid with ongoing GI bleed.  Essential hypertension Pressure currently within goal. -Holding home losartan for concern of GI bleed  OSA on CPAP -Continue CPAP at night   Subjective: Patient had couple of dark color small amount of stool.  No abdominal pain or active  bleeding.  Physical Exam: Vitals:   02/13/23 1131 02/13/23 1148 02/13/23 1200 02/13/23 1207  BP: 136/70 (!) 127/109 (!) 140/75 (!) 138/59  Pulse: 77 71 72 73  Resp: (!) 24 17 (!) 21 15  Temp:  98 F (36.7 C)  (!) 97.5 F (36.4 C)  TempSrc:  Oral  Oral  SpO2: 99% 98% 98% 95%  Weight:      Height:       General.  Overweight lady, in no acute distress. Pulmonary.  Lungs clear bilaterally, normal respiratory effort. CV.  Regular rate and rhythm, no JVD, rub or murmur. Abdomen.  Soft, nontender, nondistended, BS positive. CNS.  Alert and oriented .  No focal neurologic deficit. Extremities.  No edema, no cyanosis, pulses intact and symmetrical. Psychiatry.  Judgment and insight appears normal.   Data Reviewed: Prior data reviewed  Family Communication: Discussed with husband at bedside  Disposition: Status is: Inpatient Remains inpatient appropriate because: Severity of illness  Planned Discharge Destination: Home  Time spent: 50 minutes  This record has been created using Conservation officer, historic buildings. Errors have been sought and corrected,but may not always be located. Such creation errors do not reflect on the standard of care.   Author: Arnetha Courser, MD 02/13/2023 2:25 PM  For on call review [email or web address].

## 2023-02-13 NOTE — TOC CM/SW Note (Signed)
Transition of Care [facility]) - Inpatient Brief Assessment   Patient Details  Name: [name] MRN: [id number] Date of Birth: [birth year]  Transition of Care [facility]) CM/SW Contact:    Margarito Liner, LCSW Phone Number: 02/13/2023, 1:30 PM   Clinical Narrative: CSW reviewed chart. No TOC needs identified at this time. CSW will continue to follow progress. Please place [facility] consult if any needs arise.  Transition of Care Asessment: Insurance and Status: Insurance coverage has been reviewed Patient has primary care physician: Yes Home environment has been reviewed: Single family home Prior level of function:: Not documented Prior/Current Home Services: No current home services Social Drivers of Health Review: SDOH reviewed no interventions necessary Readmission risk has been reviewed: Yes Transition of care needs: no transition of care needs at this time

## 2023-02-13 NOTE — Assessment & Plan Note (Signed)
Well-controlled, CBG within goal, A1c of 6.2 -SSI was discontinued as patient does not want to be checked very regularly and blood glucose remained within goal

## 2023-02-13 NOTE — Progress Notes (Signed)
The patient had a diverticular bleed with diverticulitis on the CT scan with hypotension and admission to the ICU.  The patient had a subsequent positive bleeding scan with a negative IR angiogram.  The patient's hemoglobin was 7.8 yesterday then at midnight was 7.2 and is 7.3 this morning.  The patient is not a candidate for any GI intervention with acute diverticulitis and the high risk of perforation.  I have discussed this with the patient and her family.  Further intervention may include repeat CT angio versus repeat bleeding scan and interventional radiology versus colectomy if the bleeding does not stop.  I will sign off.  Please call if any further GI concerns or questions.  We would like to thank you for the opportunity to participate in the care of [name].

## 2023-02-13 NOTE — Progress Notes (Signed)
   02/12/23 1050  Spiritual Encounters  Type of Visit Follow up  Care provided to: Pt and family  Conversation partners present during encounter Nurse  Referral source Chaplain team  Reason for visit Routine spiritual support  OnCall Visit No  Spiritual Framework  Presenting Themes Meaning/purpose/sources of inspiration;Values and beliefs;Impactful experiences and emotions;Courage hope and growth;Coping tools  Values/beliefs Pt stated she is a [place]; prays in [place]' name  Family Stress Factors Other (Comment) (Daughter at bedside; Dad taking a break in [place])  Interventions  Spiritual Care Interventions Made Established relationship of care and support;Compassionate presence;Reflective listening;Narrative/life review;Explored values/beliefs/practices/strengths;Prayer;Meaning making  Intervention Outcomes  Outcomes Connection to spiritual care;Awareness around self/spiritual resourses;Connection to values and goals of care;Awareness of support;Reduced anxiety   Finished documentation from yesterday during charting outage.

## 2023-02-14 DIAGNOSIS — I25118 Atherosclerotic heart disease of native coronary artery with other forms of angina pectoris: Secondary | ICD-10-CM | POA: Diagnosis not present

## 2023-02-14 DIAGNOSIS — E1159 Type 2 diabetes mellitus with other circulatory complications: Secondary | ICD-10-CM

## 2023-02-14 DIAGNOSIS — K5733 Diverticulitis of large intestine without perforation or abscess with bleeding: Secondary | ICD-10-CM | POA: Diagnosis not present

## 2023-02-14 DIAGNOSIS — I10 Essential (primary) hypertension: Secondary | ICD-10-CM | POA: Diagnosis not present

## 2023-02-14 DIAGNOSIS — K922 Gastrointestinal hemorrhage, unspecified: Secondary | ICD-10-CM | POA: Diagnosis not present

## 2023-02-14 LAB — HEMOGLOBIN AND HEMATOCRIT, BLOOD
HCT: 32.9 % — ABNORMAL LOW (ref 36.0–46.0)
HCT: 31.5 % — ABNORMAL LOW (ref 36.0–46.0)
HCT: 33 % — ABNORMAL LOW (ref 36.0–46.0)
Hemoglobin: 11.4 g/dL — ABNORMAL LOW (ref 12.0–15.0)
Hemoglobin: 11.1 g/dL — ABNORMAL LOW (ref 12.0–15.0)
Hemoglobin: 11.5 g/dL — ABNORMAL LOW (ref 12.0–15.0)

## 2023-02-14 LAB — BPAM RBC
Blood Product Expiration Date: 202502182359
Blood Product Expiration Date: 202502182359
ISSUE DATE / TIME: 202501280905
ISSUE DATE / TIME: 202501281145
Unit Type and Rh: 7300
Unit Type and Rh: 7300

## 2023-02-14 LAB — TYPE AND SCREEN
ABO/RH(D): B POS
Antibody Screen: NEGATIVE
Unit division: 0
Unit division: 0

## 2023-02-14 LAB — CBC
HCT: 31.9 % — ABNORMAL LOW (ref 36.0–46.0)
Hemoglobin: 11.3 g/dL — ABNORMAL LOW (ref 12.0–15.0)
MCH: 29.5 pg (ref 26.0–34.0)
MCHC: 35.4 g/dL (ref 30.0–36.0)
MCV: 83.3 fL (ref 80.0–100.0)
Platelets: 208 10*3/uL (ref 150–400)
RBC: 3.83 MIL/uL — ABNORMAL LOW (ref 3.87–5.11)
RDW: 17 % — ABNORMAL HIGH (ref 11.5–15.5)
WBC: 15.3 10*3/uL — ABNORMAL HIGH (ref 4.0–10.5)
nRBC: 0.1 % (ref 0.0–0.2)

## 2023-02-14 LAB — RENAL FUNCTION PANEL
Albumin: 2.7 g/dL — ABNORMAL LOW (ref 3.5–5.0)
Anion gap: 10 (ref 5–15)
BUN: 13 mg/dL (ref 8–23)
CO2: 24 mmol/L (ref 22–32)
Calcium: 8.1 mg/dL — ABNORMAL LOW (ref 8.9–10.3)
Chloride: 105 mmol/L (ref 98–111)
Creatinine, Ser: 0.76 mg/dL (ref 0.44–1.00)
GFR, Estimated: >60 mL/min (ref >60)
Glucose, Bld: 94 mg/dL (ref 70–99)
Phosphorus: 3.9 mg/dL (ref 2.5–4.6)
Potassium: 3.5 mmol/L (ref 3.5–5.1)
Sodium: 139 mmol/L (ref 135–145)

## 2023-02-14 LAB — GLUCOSE, CAPILLARY
Glucose-Capillary: 93 mg/dL (ref 70–99)
Glucose-Capillary: 119 mg/dL — ABNORMAL HIGH (ref 70–99)

## 2023-02-14 MED ORDER — TICAGRELOR 90 MG PO TABS
90.0000 mg | ORAL_TABLET | Freq: Two times a day (BID) | ORAL | Status: DC
  Administered 2023-02-14 – 2023-02-16 (×4): 90 mg via ORAL
  Filled 2023-02-14 (×4): qty 1

## 2023-02-14 MED ORDER — DOCUSATE SODIUM 100 MG PO CAPS
100.0000 mg | ORAL_CAPSULE | Freq: Two times a day (BID) | ORAL | Status: DC
  Administered 2023-02-14 – 2023-02-16 (×5): 100 mg via ORAL
  Filled 2023-02-14 (×5): qty 1

## 2023-02-14 NOTE — Care Management Important Message (Signed)
Important Message  Patient Details  Name: [name] MRN: [id number] Date of Birth: 10/02/1945   Important Message Given:  Yes - Medicare IM     Cristela Blue, CMA 02/14/2023, 10:28 AM

## 2023-02-14 NOTE — TOC Progression Note (Signed)
Transition of Care [facility]) - Progression Note    Patient Details  Name: [name] MRN: [id number] Date of Birth: [birth year]  [facility]) CM/SW Contact  Truddie Hidden, RN Phone Number: 02/14/2023, 11:24 AM  Clinical Narrative:    TOC continuing to follow patient's progress throughout discharge planning.        Expected Discharge Plan and Services                                               Social Determinants of Health (SDOH) Interventions SDOH Screenings   Food Insecurity: No Food Insecurity (02/09/2023)  Housing: Unknown (02/09/2023)  Transportation Needs: No Transportation Needs (02/09/2023)  Recent Concern: Transportation Needs - Unmet Transportation Needs (11/21/2022)  Utilities: Not At Risk (02/09/2023)  Alcohol Screen: Low Risk  (11/21/2022)  Depression (PHQ2-9): Low Risk  (01/11/2023)  Recent Concern: Depression (PHQ2-9) - High Risk (11/17/2022)  Financial Resource Strain: Low Risk  (02/01/2023)   Received from [facility]  Physical Activity: Unknown (07/30/2018)  Social Connections: Socially Integrated (02/09/2023)  Stress: No Stress Concern Present (07/30/2018)  Tobacco Use: Medium Risk (02/08/2023)    Readmission Risk Interventions     No data to display

## 2023-02-14 NOTE — Progress Notes (Signed)
Rounding Note    Patient Name: [name] Date of Encounter: 02/14/2023  [facility] Cardiologist: Lorine Bears, MD   Subjective   Patient feels better today. Continues to have melena although started oral iron yesterday which complicates assessment of bleeding. Hgb improved to 11.3 after 2 units of PRBCs yesterday.  Inpatient Medications    Scheduled Meds:  aspirin EC  81 mg Oral Daily   Chlorhexidine Gluconate Cloth  6 each Topical Daily   docusate sodium  100 mg Oral BID   Fe Fum-Vit C-Vit B12-FA  1 capsule Oral BID   gabapentin  200 mg Oral TID   latanoprost  1 drop Both Eyes QHS   pantoprazole (PROTONIX) IV  40 mg Intravenous Q12H   sodium chloride flush  10-40 mL Intracatheter Q12H   sodium chloride flush  3 mL Intravenous Q12H   Continuous Infusions:  piperacillin-tazobactam (ZOSYN)  IV 3.375 g (02/14/23 0820)   PRN Meds: acetaminophen **OR** acetaminophen, ALPRAZolam, ondansetron **OR** ondansetron (ZOFRAN) IV, mouth rinse, polyethylene glycol, sodium chloride, sodium chloride flush   Vital Signs    Vitals:   02/13/23 2308 02/14/23 0344 02/14/23 0830 02/14/23 1329  BP: (!) 145/71 124/64 (!) 155/72 126/72  Pulse: 76 69 68 88  Resp: 17 17 16 16   Temp: 98.5 F (36.9 C) 97.6 F (36.4 C) (!) 97.5 F (36.4 C) 99 F (37.2 C)  TempSrc:      SpO2: 97% 97% 96% 98%  Weight:      Height:        Intake/Output Summary (Last 24 hours) at 02/14/2023 1404 Last data filed at 02/14/2023 0500 Gross per 24 hour  Intake 858.68 ml  Output 300 ml  Net 558.68 ml      02/11/2023    3:24 PM 02/09/2023    7:41 PM 02/08/2023    1:33 PM  Last 3 Weights  Weight (lbs) 204 lb 2.3 oz 200 lb 6.4 oz 194 lb 0.1 oz  Weight (kg) 92.6 kg 90.9 kg 88 kg      Telemetry    Sinus rhythm with PACs and PVCs - Personally Reviewed  Physical Exam   GEN: No acute distress.   Neck: No JVD Cardiac: RRR, no murmurs, rubs, or gallops.  Respiratory: Clear to auscultation  bilaterally. GI: Soft, nontender, non-distended  MS: No edema; No deformity. Neuro:  Nonfocal  Psych: Normal affect   Labs    High Sensitivity Troponin:   Recent Labs  Lab 02/11/23 2025 02/12/23 0302  TROPONINIHS 9 8     Chemistry Recent Labs  Lab 02/12/23 0302 02/12/23 0307 02/13/23 0507 02/14/23 0440  NA  --  139 141 139  K  --  3.8 3.5 3.5  CL  --  107 108 105  CO2  --  22 25 24   GLUCOSE  --  108* 123* 94  BUN  --  22 15 13   CREATININE  --  0.69 0.75 0.76  CALCIUM  --  7.8* 7.6* 8.1*  MG 2.4  --   --   --   ALBUMIN  --  2.7* 2.5* 2.7*  GFRNONAA  --  >60 >60 >60  ANIONGAP  --  10 8 10     Lipids No results for input(s): "CHOL", "TRIG", "HDL", "LABVLDL", "LDLCALC", "CHOLHDL" in the last 168 hours.  Hematology Recent Labs  Lab 02/12/23 0302 02/12/23 0102 02/13/23 0507 02/13/23 1529 02/13/23 2200 02/14/23 0440 02/14/23 0843  WBC 19.6*  --  14.6*  --   --   --  15.3*  RBC 2.89*  --  2.42*  --   --   --  3.83*  HGB 8.4*   < > 7.3*   < > 11.5* 11.4* 11.3*  HCT 24.0*   < > 21.1*   < > 32.8* 32.9* 31.9*  MCV 83.0  --  87.2  --   --   --  83.3  MCH 29.1  --  30.2  --   --   --  29.5  MCHC 35.0  --  34.6  --   --   --  35.4  RDW 15.6*  --  15.6*  --   --   --  17.0*  PLT 193  --  184  --   --   --  208   < > = values in this interval not displayed.   Thyroid No results for input(s): "TSH", "FREET4" in the last 168 hours.  BNP Recent Labs  Lab 02/08/23 1336  BNP 150.3*    DDimer No results for input(s): "DDIMER" in the last 168 hours.   Radiology    No results found.   Cardiac Studies   Echo limited 01/2023 1. Left ventricular ejection fraction, by estimation, is 45 to 50%. The  left ventricle has moderately decreased function. The left ventricle  demonstrates global hypokinesis. Left ventricular diastolic parameters are  consistent with Grade I diastolic  dysfunction (impaired relaxation).   2. Right ventricular systolic function is normal. The  right ventricular  size is normal.   3. The mitral valve is normal in structure. Mild mitral valve  regurgitation. No evidence of mitral stenosis.   4. The aortic valve is calcified. There is moderate calcification of the  aortic valve. Aortic valve regurgitation is not visualized. Mild aortic  valve stenosis. Aortic valve mean gradient measures 13.0 mmHg.   5. The inferior vena cava is normal in size with greater than 50%  respiratory variability, suggesting right atrial pressure of 3 mmHg.    LHC staged PCI 12/11/22   Ost LM lesion is 30% stenosed.   Prox LAD to Mid LAD lesion is 30% stenosed.   Mid LAD lesion is 40% stenosed.   Mid LAD to Dist LAD lesion is 30% stenosed.   Prox RCA to Mid RCA lesion is 30% stenosed.   Dist RCA lesion is 70% stenosed with 90% stenosed side branch in RPDA.   1st Diag lesion is 50% stenosed.   Ost RCA to Prox RCA lesion is 50% stenosed.   RPAV lesion is 40% stenosed.   Non-stenotic Prox Cx to Mid Cx lesion was previously treated.   Non-stenotic 1st Mrg lesion was previously treated.   A drug-eluting stent was successfully placed using a STENT ONYX FRONTIER 2.5X22.   Post intervention, there is a 0% residual stenosis.   Post intervention, the side branch was reduced to 0% residual stenosis. 1.  Widely patent left circumflex stent with no significant restenosis.  Severe distal RCA stenosis extending into the right PDA.  Moderate ostial RCA stenosis with pressure dampening with guide catheter engagement.  However, there was no pressure dampening with diagnostic catheter engagement during most recent angiography. 2.  Left ventricular angiography was not performed.  Mildly elevated left ventricular end-diastolic pressure at 18 mmHg. 3.  Successful angioplasty and drug-eluting stent placement to the distal right coronary artery extending into the right PDA.   LHC 11/20/23   Prox RCA lesion is 30% stenosed.   Dist RCA lesion is 70% stenosed with  90% stenosed  side branch in RPDA.   Prox LAD to Mid LAD lesion is 30% stenosed.   1st Diag lesion is 50% stenosed.   Mid LAD lesion is 40% stenosed.   Mid LAD to Dist LAD lesion is 30% stenosed.   Ost LM lesion is 30% stenosed.   1st Mrg lesion is 99% stenosed.   Prox Cx to Mid Cx lesion is 90% stenosed.   A drug-eluting stent was successfully placed using a STENT ONYX FRONTIER 2.5X26.   A drug-eluting stent was successfully placed using a STENT ONYX FRONTIER 2.5X22.   Post intervention, there is a 0% residual stenosis.   Post intervention, there is a 0% residual stenosis. 1.  Diffuse and moderately calcified coronary arteries with severe two-vessel coronary artery disease.  The culprit for myocardial infarction seems to be plaque rupture and OM1 and mid left circumflex.  In addition, there is severe stenosis in the distal RCA extending into the ostium of the right PDA.  The LAD has moderate disease in multiple areas. 2.  Left ventricular angiography was not performed.  EF was moderately reduced by echo.  LVEDP was mildly elevated. 3.  Successful complex bifurcation angioplasty and kissing stent placement to OM1/mid left circumflex using 2 drug-eluting stents.  Patient Profile     [name] is a 78 y.o. 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   Assessment & Plan    Diverticulitis GI bleed - Presented 1/23 with 1 day of BRBPR x 3 with associated syncope - She was on DAPT with Brilinta and aspirin for recent PCI/DES placement x 3 in 11/2022 - CT of the abdomen showed evidence of diverticulitis with no evidence of active GI bleed, WBCs were 21.1 - Brilinta was held and she was continued on aspirin - Underwent tagged RBC scan 1/26 which showed  active colonic bleeding from the right hepatic flexure. Patient went into hemorrhagic shock with systolic BP 50 mmHg, large amount of bleeding per rectum. Transferred to ICU with rapid response called. Received emergent blood transfusion. - IR took patient for emergent embolization, did not see active bleeding at the time of angiography, and therefore no intervention was done - Patient continues to have melena yesterday although started on oral iron yesterday which complicates things. Would recommend IV iron over oral in order to better assess active bleeding.  - Received 2 units PRBCs yesterday. Hgb today 11.3. Will continue to hold Brilinta and continue ASA.  - Management per GI and IM  CAD s/p PCI 11/2022 - Left heart catheterization 11//24 showed diffuse moderately calcified arteries with severe two-vessel CAD. She underwent successful bifurcation angioplasty with kissing stent placement to OM1/mid left circumflex using 2 DES. 12/11/2018 staged PCI of the RCA with widely patent left circumflex and mildly elevated LVEDP she was recommended to continue with DAPT with aspirin and Brilinta for 12 months  - Brilinta held due to worsening hgb and active bleeding - Continued on ASA and carvedilol - Genetic testing for clopidogrel drawn, results pending - Plan once hgb is stabilized is to preferably start clopidogrel (depending on genetic testing results) and stop ASA - Recommend hgb > 8 - Plan to discuss timeline for restarting P2Y12 with interventional cardiology and GI  Hyperlipidemia - Longstanding history of statin  intolerance, failed atorvastatin and rosuvastatin - Consider PCSK9i or inclisiran as outpatient  For questions or updates, please contact [facility] Please consult [email or web address] for contact info under        Signed, Orion Crook, PA-C  02/14/2023, 2:04 PM

## 2023-02-14 NOTE — Progress Notes (Signed)
Progress Note   Patient: [name] NWG:[id number] DOB: 02-Nov-1945 DOA: 02/08/2023     6 DOS: the patient was seen and examined on 02/14/2023      Brief hospital course:  [name] is a 78 y.o. 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    Patient had total of 3 episodes of bright red blood per rectum, she was on aspirin and Brilinta due to recent PCI done in November 2024.   On presentation hemodynamically stable, labs with leukocytosis at 20.2, hemoglobin 11.7, decreased from 14.3  28-month ago, CO2 of 20, blood glucose 231, BUN 22, creatinine 1.02. CT angio abdomen for GI protocol with concern of acute inflammation involving the right colon near hepatic flexure, most compatible with acute diverticulitis.  No evidence of active GI bleeding.  Also noted a small right adrenal adenoma and a small hiatal hernia.   Patient was started on Zosyn.  GI and cardiology are both consulted.   Cardiology recommended continue with aspirin and hold Brilinta for now.  Patient is high risk for restenosis of stents.   Gastroenterology recommended RBC tagged scan if bleeding persists.   1/24: Hemodynamically stable, hemoglobin at 10.6, slight worsening of leukocytosis at 21.1.  BNP of 150, lactic acid 3 >>3.8 Had another episode of small amount of bleeding per rectum, RBC tagged scan ordered.  Also giving some more gentle IV fluid   1/25: Patient continued to have bleeding per rectum, overnight 5 episodes recorded.  Hemoglobin decreased to 9.1>>8.4 today.  Worsening lactic acidosis after some improvement.  Continuing aspirin per cardiology and keep holding Brilinta.  RBC tagged scan was negative. Ordered 1 unit of PRBC.    1/26: Continue to bleed, hemoglobin was 7.9 this morning after getting 1 unit  yesterday.  Patient was sent for another RBC tagged scan, towards the end of the scan she felt little chest pressure and at the end she suddenly became hypotensive, cold and clammy, rapid response was called.  Patient was given emergent blood transfusion and 1 L of bolus and transferred to ICU.  RBC tagged scan came back positive for active bleeding just below hepatic flexure, IR is going to do embolization as she continued to have significant bleeding per rectum. Emergency protocol blood transfusion and FFP ordered. Blood pressure responding to IV fluid and transfusions. Ordered troponin, cardiology and GI are also aware.  PCCM on board   1/27: 1 episode of small amount of bleeding overnight and 1 dark-colored stool during the day.  Patient had her mesenteric angiography with IR yesterday evening, there was no active bleeding at that time, no embolization was done.  Patient with hemoglobin of 8.4 this morning s/p total of 4 unit of PRBC and 2 units of FFP. Cardiology is planning to do Brilinta monotherapy once bleeding resolved.  They also requested genotype testing for clopidogrel with the plan to transition her from ticagrelor to clopidogrel once results are back and if she is response.  Losing most of her IV access and a very hard stick, eventually agreed to place a PICC line which was ordered.   1/28: Continue to have some dark-colored stool but no other active bleeding, hemoglobin at 7.3 this morning-ordered 2 more units of PRBC. Plan is to do 2 weeks of antibiotics for diverticulitis due to this persistent  bleeding, we can switch to p.o. once bleeding resolved.     Assessment and Plan: * Acute GI bleeding Diverticulitis.  CTA abdomen with concern of diverticulitis, no active bleeding.  Patient was on DAPT with aspirin and Brilinta due to placement of 3 recent cardiac stents. Current hemoglobin of 7.9, it was 14.32 months ago. After discussing with cardiology we will continue with aspirin and  hold Brilinta for now. S/p total of 4 unit of PRBC and 2 unit of FFP 1/28; dark-colored stool, hemoglobin at 7.3 so ordered 2 more units of PRBC. Continue with Zosyn-will do 2 weeks of antibiotics, can switch to p.o. Augmentin once bleeding stopped. -Monitor hemoglobin Transfuse with Hb less than 8   Coronary artery disease Patient has 3 recent stents placement, 2 on 11/4 and 1 on 11/2522 by cardiology and was started on aspirin and Brilinta. Tough situation as she will be high risk for restenosis. Discussed with cardiology and they are advising continue aspirin for now and hold Brilinta  Cardiologist on board and case discussed Continue home carvedilol Continue with aspirin Continue holding Brilinta   Lactic acidosis Now resolved.   Diabetes mellitus (HCC) Well-controlled, CBG within goal, A1c of 6.2 -SSI was discontinued as patient does not want to be checked very regularly and blood glucose remained within goal   Chronic HFrEF (heart failure with reduced ejection fraction) (HCC) Echocardiogram done on 01/31/2023 with EF of 45 to 50% and grade 1 diastolic dysfunction. -Continue with carvedilol -Monitor volume status-patient received quite a bit of fluid with ongoing GI bleed.   Essential hypertension Pressure currently within goal. -Holding home losartan for concern of GI bleed   OSA on CPAP -Continue CPAP at night   Subjective: Patient had couple of dark color small amount of stool.  No abdominal pain or active bleeding.   Physical Exam: General.  Overweight lady, in no acute distress. Pulmonary.  Lungs clear bilaterally, normal respiratory effort. CV.  Regular rate and rhythm, no JVD, rub or murmur. Abdomen.  Soft, nontender, nondistended, BS positive. CNS.  Alert and oriented .  No focal neurologic deficit. Extremities.  No edema, no cyanosis, pulses intact and symmetrical. Psychiatry.  Judgment and insight appears normal.    Data Reviewed: Prior data reviewed    Family Communication: Discussed with husband at bedside   Disposition: Status is: Inpatient Remains inpatient appropriate because: Severity of illness  Planned Discharge Destination: Home    Vitals:   02/14/23 0344 02/14/23 0830 02/14/23 1329 02/14/23 1553  BP: 124/64 (!) 155/72 126/72 138/84  Pulse: 69 68 88 81  Resp: 17 16 16 17   Temp: 97.6 F (36.4 C) (!) 97.5 F (36.4 C) 99 F (37.2 C) 98.3 F (36.8 C)  TempSrc:    Oral  SpO2: 97% 96% 98% 96%  Weight:      Height:          Latest Ref Rng & Units 02/14/2023    3:52 PM 02/14/2023    8:43 AM 02/14/2023    4:40 AM  CBC  WBC 4.0 - 10.5 K/uL  15.3    Hemoglobin 12.0 - 15.0 g/dL 13.0  86.5  78.4   Hematocrit 36.0 - 46.0 % 31.5  31.9  32.9   Platelets 150 - 400 K/uL  208         Latest Ref Rng & Units 02/14/2023    4:40 AM 02/13/2023    5:07 AM 02/12/2023    3:07 AM  BMP  Glucose 70 - 99  mg/dL 94  621  308   BUN 8 - 23 mg/dL 13  15  22    Creatinine 0.44 - 1.00 mg/dL 6.57  8.46  9.62   Sodium 135 - 145 mmol/L 139  141  139   Potassium 3.5 - 5.1 mmol/L 3.5  3.5  3.8   Chloride 98 - 111 mmol/L 105  108  107   CO2 22 - 32 mmol/L 24  25  22    Calcium 8.9 - 10.3 mg/dL 8.1  7.6  7.8      Author: Loyce Dys, MD 02/14/2023 6:50 PM  For on call review [email or web address].

## 2023-02-15 DIAGNOSIS — K922 Gastrointestinal hemorrhage, unspecified: Secondary | ICD-10-CM | POA: Diagnosis not present

## 2023-02-15 DIAGNOSIS — G4733 Obstructive sleep apnea (adult) (pediatric): Secondary | ICD-10-CM | POA: Diagnosis not present

## 2023-02-15 DIAGNOSIS — E1159 Type 2 diabetes mellitus with other circulatory complications: Secondary | ICD-10-CM | POA: Diagnosis not present

## 2023-02-15 DIAGNOSIS — I25118 Atherosclerotic heart disease of native coronary artery with other forms of angina pectoris: Secondary | ICD-10-CM | POA: Diagnosis not present

## 2023-02-15 DIAGNOSIS — I10 Essential (primary) hypertension: Secondary | ICD-10-CM | POA: Diagnosis not present

## 2023-02-15 LAB — PREPARE FRESH FROZEN PLASMA: Unit division: 0

## 2023-02-15 LAB — BASIC METABOLIC PANEL
Anion gap: 8 (ref 5–15)
BUN: 12 mg/dL (ref 8–23)
CO2: 26 mmol/L (ref 22–32)
Calcium: 8.2 mg/dL — ABNORMAL LOW (ref 8.9–10.3)
Chloride: 106 mmol/L (ref 98–111)
Creatinine, Ser: 0.7 mg/dL (ref 0.44–1.00)
GFR, Estimated: >60 mL/min (ref >60)
Glucose, Bld: 106 mg/dL — ABNORMAL HIGH (ref 70–99)
Potassium: 3.6 mmol/L (ref 3.5–5.1)
Sodium: 140 mmol/L (ref 135–145)

## 2023-02-15 LAB — BPAM FFP
Blood Product Expiration Date: 202501312359
Blood Product Expiration Date: 202501312359
ISSUE DATE / TIME: 202501261550
ISSUE DATE / TIME: 202501261550
Unit Type and Rh: 7300
Unit Type and Rh: 7300

## 2023-02-15 LAB — CBC WITH DIFFERENTIAL/PLATELET
Abs Immature Granulocytes: 0.48 10*3/uL — ABNORMAL HIGH (ref 0.00–0.07)
Basophils Absolute: 0.1 10*3/uL (ref 0.0–0.1)
Basophils Relative: 1 %
Eosinophils Absolute: 0 10*3/uL (ref 0.0–0.5)
Eosinophils Relative: 0 %
HCT: 33 % — ABNORMAL LOW (ref 36.0–46.0)
Hemoglobin: 11.4 g/dL — ABNORMAL LOW (ref 12.0–15.0)
Immature Granulocytes: 4 %
Lymphocytes Relative: 19 %
Lymphs Abs: 2.5 10*3/uL (ref 0.7–4.0)
MCH: 29.7 pg (ref 26.0–34.0)
MCHC: 34.5 g/dL (ref 30.0–36.0)
MCV: 85.9 fL (ref 80.0–100.0)
Monocytes Absolute: 0.8 10*3/uL (ref 0.1–1.0)
Monocytes Relative: 7 %
Neutro Abs: 9 10*3/uL — ABNORMAL HIGH (ref 1.7–7.7)
Neutrophils Relative %: 69 %
Platelets: 221 10*3/uL (ref 150–400)
RBC: 3.84 MIL/uL — ABNORMAL LOW (ref 3.87–5.11)
RDW: 17 % — ABNORMAL HIGH (ref 11.5–15.5)
WBC: 12.9 10*3/uL — ABNORMAL HIGH (ref 4.0–10.5)
nRBC: 0 % (ref 0.0–0.2)

## 2023-02-15 MED ORDER — GUAIFENESIN-DM 100-10 MG/5ML PO SYRP
5.0000 mL | ORAL_SOLUTION | ORAL | Status: DC | PRN
  Administered 2023-02-15: 5 mL via ORAL
  Filled 2023-02-15: qty 10

## 2023-02-15 NOTE — Progress Notes (Signed)
Progress Note   Patient: [name]:[id number] DOB: 01/26/1945 DOA: 02/08/2023     7 DOS: the patient was seen and examined on 02/15/2023     Brief hospital course:  [name] is a 78 y.o. 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    Patient had total of 3 episodes of bright red blood per rectum, she was on aspirin and Brilinta due to recent PCI done in November 2024.   On presentation hemodynamically stable, labs with leukocytosis at 20.2, hemoglobin 11.7, decreased from 14.3  76-month ago, CO2 of 20, blood glucose 231, BUN 22, creatinine 1.02. CT angio abdomen for GI protocol with concern of acute inflammation involving the right colon near hepatic flexure, most compatible with acute diverticulitis.  No evidence of active GI bleeding.  Also noted a small right adrenal adenoma and a small hiatal hernia.   Patient was started on Zosyn.  GI and cardiology are both consulted.   Cardiology recommended continue with aspirin and hold Brilinta for now.  Patient is high risk for restenosis of stents.   Gastroenterology recommended RBC tagged scan if bleeding persists.   1/24: Hemodynamically stable, hemoglobin at 10.6, slight worsening of leukocytosis at 21.1.  BNP of 150, lactic acid 3 >>3.8 Had another episode of small amount of bleeding per rectum, RBC tagged scan ordered.  Also giving some more gentle IV fluid   1/25: Patient continued to have bleeding per rectum, overnight 5 episodes recorded.  Hemoglobin decreased to 9.1>>8.4 today.  Worsening lactic acidosis after some improvement.  Continuing aspirin per cardiology and keep holding Brilinta.  RBC tagged scan was negative. Ordered 1 unit of PRBC.    1/26: Continue to bleed, hemoglobin was 7.9 this morning after getting 1 unit yesterday.   Patient was sent for another RBC tagged scan, towards the end of the scan she felt little chest pressure and at the end she suddenly became hypotensive, cold and clammy, rapid response was called.  Patient was given emergent blood transfusion and 1 L of bolus and transferred to ICU.  RBC tagged scan came back positive for active bleeding just below hepatic flexure, IR is going to do embolization as she continued to have significant bleeding per rectum. Emergency protocol blood transfusion and FFP ordered. Blood pressure responding to IV fluid and transfusions. Ordered troponin, cardiology and GI are also aware.  PCCM on board   1/27: 1 episode of small amount of bleeding overnight and 1 dark-colored stool during the day.  Patient had her mesenteric angiography with IR yesterday evening, there was no active bleeding at that time, no embolization was done.  Patient with hemoglobin of 8.4 this morning s/p total of 4 unit of PRBC and 2 units of FFP. Cardiology is planning to do Brilinta monotherapy once bleeding resolved.  They also requested genotype testing for clopidogrel with the plan to transition her from ticagrelor to clopidogrel once results are back and if she is response.  Losing most of her IV access and a very hard stick, eventually agreed to place a PICC line which was ordered.   1/28: Continue to have some dark-colored stool but no other active bleeding, hemoglobin at 7.3 this morning-ordered 2 more units of PRBC. Plan is to do 2 weeks of antibiotics for diverticulitis due to this persistent bleeding,  we can switch to p.o. once bleeding resolved.     Assessment and Plan: * Acute GI bleeding Diverticulitis.  CTA abdomen with concern of diverticulitis, no active bleeding.  Patient was on DAPT with aspirin and Brilinta due to placement of 3 recent cardiac stents. Current hemoglobin of 7.9, it was 14.32 months ago. After discussing with cardiology we will continue with aspirin and hold  Brilinta for now. S/p total of 4 unit of PRBC and 2 unit of FFP 1/28; dark-colored stool, hemoglobin at 7.3 so ordered 2 more units of PRBC. Continue with Zosyn-will do 2 weeks of antibiotics, can switch to p.o. Augmentin once bleeding stopped. -Monitor hemoglobin We will transfuse with Hb less than 8   Coronary artery disease Patient has 3 recent stents placement, 2 on 11/4 and 1 on 11/2522 by cardiology and was started on aspirin and Brilinta. Tough situation as she will be high risk for restenosis. Cardiology on board and case discussed  Continue home carvedilol Holding aspirin currently on Brilinta   Lactic acidosis Now resolved.   Diabetes mellitus (HCC) Well-controlled, CBG within goal, A1c of 6.2 -SSI was discontinued as patient does not want to be checked very regularly and blood glucose remained within goal   Chronic HFrEF (heart failure with reduced ejection fraction) (HCC) Echocardiogram done on 01/31/2023 with EF of 45 to 50% and grade 1 diastolic dysfunction. -Continue with carvedilol -Monitor volume status-patient received quite a bit of fluid with ongoing GI bleed.   Essential hypertension Pressure currently within goal. -Holding home losartan for concern of GI bleed   OSA on CPAP -Continue CPAP at night   Subjective:  Denies nausea vomiting abdominal pain No bleeding per rectum noted overnight and this morning  Physical Exam: General.  Overweight lady, in no acute distress. Pulmonary.  Lungs clear bilaterally, normal respiratory effort. CV.  Regular rate and rhythm, no JVD, rub or murmur. Abdomen.  Soft, nontender, nondistended, BS positive. CNS.  Alert and oriented .  No focal neurologic deficit. Extremities.  No edema, no cyanosis, pulses intact and symmetrical. Psychiatry.  Judgment and insight appears normal.    Data Reviewed:    Latest Ref Rng & Units 02/15/2023    4:00 AM 02/14/2023   10:15 PM 02/14/2023    3:52 PM  CBC  WBC 4.0 - 10.5 K/uL 12.9      Hemoglobin 12.0 - 15.0 g/dL 82.9  56.2  13.0   Hematocrit 36.0 - 46.0 % 33.0  33.0  31.5   Platelets 150 - 400 K/uL 221          Latest Ref Rng & Units 02/15/2023    4:00 AM 02/14/2023    4:40 AM 02/13/2023    5:07 AM  BMP  Glucose 70 - 99 mg/dL 865  94  784   BUN 8 - 23 mg/dL 12  13  15    Creatinine 0.44 - 1.00 mg/dL 6.96  2.95  2.84   Sodium 135 - 145 mmol/L 140  139  141   Potassium 3.5 - 5.1 mmol/L 3.6  3.5  3.5   Chloride 98 - 111 mmol/L 106  105  108   CO2 22 - 32 mmol/L 26  24  25    Calcium 8.9 - 10.3 mg/dL 8.2  8.1  7.6       Family Communication: Discussed with husband at bedside   Disposition: Status is: Inpatient Remains inpatient appropriate because: Severity of illness  Planned Discharge Destination: Home    Vitals:  02/15/23 0520 02/15/23 0838 02/15/23 1114 02/15/23 1755  BP: (!) 152/70 (!) 155/69 (!) 141/78 139/63  Pulse: 61 70 80 94  Resp: 20     Temp: 98.3 F (36.8 C) 98.3 F (36.8 C) 98.1 F (36.7 C) 97.7 F (36.5 C)  TempSrc:      SpO2: 98% 98% 98% 98%  Weight:      Height:         Author: Loyce Dys, MD 02/15/2023 6:57 PM  For on call review [email or web address].

## 2023-02-15 NOTE — Progress Notes (Signed)
Rounding Note    Patient Name: [name] Date of Encounter: 02/15/2023   [facility] Cardiologist: Lorine Bears, MD   Subjective   Restarted Brilinta yesterday evening. Iron stopped yesterday. Patient is anxious and tearful on exam regarding her condition.   Inpatient Medications    Scheduled Meds:  Chlorhexidine Gluconate Cloth  6 each Topical Daily   docusate sodium  100 mg Oral BID   gabapentin  200 mg Oral TID   latanoprost  1 drop Both Eyes QHS   pantoprazole (PROTONIX) IV  40 mg Intravenous Q12H   sodium chloride flush  10-40 mL Intracatheter Q12H   sodium chloride flush  3 mL Intravenous Q12H   ticagrelor  90 mg Oral BID   Continuous Infusions:  piperacillin-tazobactam (ZOSYN)  IV Stopped (02/15/23 0334)   PRN Meds: acetaminophen **OR** acetaminophen, ALPRAZolam, ondansetron **OR** ondansetron (ZOFRAN) IV, mouth rinse, polyethylene glycol, sodium chloride, sodium chloride flush   Vital Signs    Vitals:   02/14/23 2002 02/14/23 2315 02/15/23 0520 02/15/23 0838  BP: (!) 141/70 (!) 152/82 (!) 152/70 (!) 155/69  Pulse: 87 87 61 70  Resp: 20 16 20    Temp: 98.3 F (36.8 C) 98.5 F (36.9 C) 98.3 F (36.8 C) 98.3 F (36.8 C)  TempSrc: Oral Oral    SpO2: 95% 99% 98% 98%  Weight:      Height:        Intake/Output Summary (Last 24 hours) at 02/15/2023 0846 Last data filed at 02/15/2023 0530 Gross per 24 hour  Intake 763.04 ml  Output --  Net 763.04 ml      02/11/2023    3:24 PM 02/09/2023    7:41 PM 02/08/2023    1:33 PM  Last 3 Weights  Weight (lbs) 204 lb 2.3 oz 200 lb 6.4 oz 194 lb 0.1 oz  Weight (kg) 92.6 kg 90.9 kg 88 kg      Telemetry    Sinus rhythm with PACs and PVCs - Personally Reviewed  Physical Exam   GEN: No acute distress.   Neck: No JVD Cardiac: RRR, no murmurs, rubs, or gallops.  Respiratory: Clear to auscultation bilaterally. GI: Soft, nontender, non-distended  MS: No edema; No deformity. Neuro:  Nonfocal  Psych:  Normal affect   Labs    High Sensitivity Troponin:   Recent Labs  Lab 02/11/23 2025 02/12/23 0302  TROPONINIHS 9 8     Chemistry Recent Labs  Lab 02/12/23 0302 02/12/23 0307 02/13/23 0507 02/14/23 0440 02/15/23 0400  NA  --  139 141 139 140  K  --  3.8 3.5 3.5 3.6  CL  --  107 108 105 106  CO2  --  22 25 24 26   GLUCOSE  --  108* 123* 94 106*  BUN  --  22 15 13 12   CREATININE  --  0.69 0.75 0.76 0.70  CALCIUM  --  7.8* 7.6* 8.1* 8.2*  MG 2.4  --   --   --   --   ALBUMIN  --  2.7* 2.5* 2.7*  --   GFRNONAA  --  >60 >60 >60 >60  ANIONGAP  --  10 8 10 8     Lipids No results for input(s): "CHOL", "TRIG", "HDL", "LABVLDL", "LDLCALC", "CHOLHDL" in the last 168 hours.  Hematology Recent Labs  Lab 02/13/23 0507 02/13/23 1529 02/14/23 0843 02/14/23 1552 02/14/23 2215 02/15/23 0400  WBC 14.6*  --  15.3*  --   --  12.9*  RBC  2.42*  --  3.83*  --   --  3.84*  HGB 7.3*   < > 11.3* 11.1* 11.5* 11.4*  HCT 21.1*   < > 31.9* 31.5* 33.0* 33.0*  MCV 87.2  --  83.3  --   --  85.9  MCH 30.2  --  29.5  --   --  29.7  MCHC 34.6  --  35.4  --   --  34.5  RDW 15.6*  --  17.0*  --   --  17.0*  PLT 184  --  208  --   --  221   < > = values in this interval not displayed.   Thyroid No results for input(s): "TSH", "FREET4" in the last 168 hours.  BNP Recent Labs  Lab 02/08/23 1336  BNP 150.3*    DDimer No results for input(s): "DDIMER" in the last 168 hours.   Radiology    No results found.   Cardiac Studies   Echo limited 01/2023 1. Left ventricular ejection fraction, by estimation, is 45 to 50%. The  left ventricle has moderately decreased function. The left ventricle  demonstrates global hypokinesis. Left ventricular diastolic parameters are  consistent with Grade I diastolic  dysfunction (impaired relaxation).   2. Right ventricular systolic function is normal. The right ventricular  size is normal.   3. The mitral valve is normal in structure. Mild mitral valve   regurgitation. No evidence of mitral stenosis.   4. The aortic valve is calcified. There is moderate calcification of the  aortic valve. Aortic valve regurgitation is not visualized. Mild aortic  valve stenosis. Aortic valve mean gradient measures 13.0 mmHg.   5. The inferior vena cava is normal in size with greater than 50%  respiratory variability, suggesting right atrial pressure of 3 mmHg.    LHC staged PCI 12/11/22   Ost LM lesion is 30% stenosed.   Prox LAD to Mid LAD lesion is 30% stenosed.   Mid LAD lesion is 40% stenosed.   Mid LAD to Dist LAD lesion is 30% stenosed.   Prox RCA to Mid RCA lesion is 30% stenosed.   Dist RCA lesion is 70% stenosed with 90% stenosed side branch in RPDA.   1st Diag lesion is 50% stenosed.   Ost RCA to Prox RCA lesion is 50% stenosed.   RPAV lesion is 40% stenosed.   Non-stenotic Prox Cx to Mid Cx lesion was previously treated.   Non-stenotic 1st Mrg lesion was previously treated.   A drug-eluting stent was successfully placed using a STENT ONYX FRONTIER 2.5X22.   Post intervention, there is a 0% residual stenosis.   Post intervention, the side branch was reduced to 0% residual stenosis. 1.  Widely patent left circumflex stent with no significant restenosis.  Severe distal RCA stenosis extending into the right PDA.  Moderate ostial RCA stenosis with pressure dampening with guide catheter engagement.  However, there was no pressure dampening with diagnostic catheter engagement during most recent angiography. 2.  Left ventricular angiography was not performed.  Mildly elevated left ventricular end-diastolic pressure at 18 mmHg. 3.  Successful angioplasty and drug-eluting stent placement to the distal right coronary artery extending into the right PDA.   LHC 11/20/23   Prox RCA lesion is 30% stenosed.   Dist RCA lesion is 70% stenosed with 90% stenosed side branch in RPDA.   Prox LAD to Mid LAD lesion is 30% stenosed.   1st Diag lesion is 50%  stenosed.   Mid  LAD lesion is 40% stenosed.   Mid LAD to Dist LAD lesion is 30% stenosed.   Ost LM lesion is 30% stenosed.   1st Mrg lesion is 99% stenosed.   Prox Cx to Mid Cx lesion is 90% stenosed.   A drug-eluting stent was successfully placed using a STENT ONYX FRONTIER 2.5X26.   A drug-eluting stent was successfully placed using a STENT ONYX FRONTIER 2.5X22.   Post intervention, there is a 0% residual stenosis.   Post intervention, there is a 0% residual stenosis. 1.  Diffuse and moderately calcified coronary arteries with severe two-vessel coronary artery disease.  The culprit for myocardial infarction seems to be plaque rupture and OM1 and mid left circumflex.  In addition, there is severe stenosis in the distal RCA extending into the ostium of the right PDA.  The LAD has moderate disease in multiple areas. 2.  Left ventricular angiography was not performed.  EF was moderately reduced by echo.  LVEDP was mildly elevated. 3.  Successful complex bifurcation angioplasty and kissing stent placement to OM1/mid left circumflex using 2 drug-eluting stents.  Patient Profile     KARNISHA LEFEBRE is a 78 y.o. 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   Assessment & Plan    Diverticulitis GI bleed Anemia - Presented 1/23 with 1 day of BRBPR x 3 with associated syncope - She was on DAPT with Brilinta and aspirin for recent PCI/DES placement x 3 in 11/2022 - CT of the abdomen showed evidence of diverticulitis  - Brilinta was held and she was continued on aspirin - Underwent tagged RBC scan 1/26 which showed active colonic bleeding from the right hepatic flexure. Patient went into hemorrhagic shock with systolic BP 50 mmHg, large amount of  bleeding per rectum. Transferred to ICU with rapid response called. Received emergent blood transfusion. IR took patient for emergent embolization, did not see active bleeding at the time of angiography, and therefore no intervention was done - Received several units of PRBCs  - Hgb stable at 11.4 today - Restarted Brilinta yesterday and will move forward without ASA. Will continue to monitor hgb and bleeding. - Management per GI and IM  CAD s/p PCI 11/2022 - Left heart catheterization 11//24 showed diffuse moderately calcified arteries with severe two-vessel CAD. She underwent successful bifurcation angioplasty with kissing stent placement to OM1/mid left circumflex using 2 DES. 12/11/2018 staged PCI of the RCA with widely patent left circumflex and mildly elevated LVEDP she was recommended to continue with DAPT with aspirin and Brilinta for 12 months  - Brilinta previously held due to worsening hgb and active bleeding - Genetic testing for clopidogrel drawn, results pending. Would prefer clopidogrel to ticagrelor due to lower bleeding risk. - Recommend hgb > 8 - Restarted Brilinta yesterday and will move forward without ASA - Continue carvedilol  Hyperlipidemia - Longstanding history of statin intolerance, failed atorvastatin and rosuvastatin - Consider PCSK9i or inclisiran as outpatient  For questions or updates, please contact [facility] Please consult [email or web address] for contact info under   Signed, Orion Crook, PA-C  02/15/2023, 8:46 AM

## 2023-02-16 DIAGNOSIS — I25118 Atherosclerotic heart disease of native coronary artery with other forms of angina pectoris: Secondary | ICD-10-CM | POA: Diagnosis not present

## 2023-02-16 DIAGNOSIS — K922 Gastrointestinal hemorrhage, unspecified: Secondary | ICD-10-CM | POA: Diagnosis not present

## 2023-02-16 LAB — BASIC METABOLIC PANEL
Anion gap: 9 (ref 5–15)
BUN: 12 mg/dL (ref 8–23)
CO2: 25 mmol/L (ref 22–32)
Calcium: 8.1 mg/dL — ABNORMAL LOW (ref 8.9–10.3)
Chloride: 106 mmol/L (ref 98–111)
Creatinine, Ser: 0.8 mg/dL (ref 0.44–1.00)
GFR, Estimated: >60 mL/min (ref >60)
Glucose, Bld: 106 mg/dL — ABNORMAL HIGH (ref 70–99)
Potassium: 3.7 mmol/L (ref 3.5–5.1)
Sodium: 140 mmol/L (ref 135–145)

## 2023-02-16 LAB — CBC WITH DIFFERENTIAL/PLATELET
Abs Immature Granulocytes: 0.38 10*3/uL — ABNORMAL HIGH (ref 0.00–0.07)
Basophils Absolute: 0.1 10*3/uL (ref 0.0–0.1)
Basophils Relative: 1 %
Eosinophils Absolute: 1.1 10*3/uL — ABNORMAL HIGH (ref 0.0–0.5)
Eosinophils Relative: 8 %
HCT: 33.9 % — ABNORMAL LOW (ref 36.0–46.0)
Hemoglobin: 11.7 g/dL — ABNORMAL LOW (ref 12.0–15.0)
Immature Granulocytes: 3 %
Lymphocytes Relative: 20 %
Lymphs Abs: 2.5 10*3/uL (ref 0.7–4.0)
MCH: 30.1 pg (ref 26.0–34.0)
MCHC: 34.5 g/dL (ref 30.0–36.0)
MCV: 87.1 fL (ref 80.0–100.0)
Monocytes Absolute: 1 10*3/uL (ref 0.1–1.0)
Monocytes Relative: 8 %
Neutro Abs: 7.7 10*3/uL (ref 1.7–7.7)
Neutrophils Relative %: 60 %
Platelets: 256 10*3/uL (ref 150–400)
RBC: 3.89 MIL/uL (ref 3.87–5.11)
RDW: 16.7 % — ABNORMAL HIGH (ref 11.5–15.5)
Smear Review: NORMAL
WBC: 12.7 10*3/uL — ABNORMAL HIGH (ref 4.0–10.5)
nRBC: 0 % (ref 0.0–0.2)

## 2023-02-16 LAB — GLUCOSE, CAPILLARY: Glucose-Capillary: 105 mg/dL — ABNORMAL HIGH (ref 70–99)

## 2023-02-16 MED ORDER — POLYETHYLENE GLYCOL 3350 17 G PO PACK: 17.0000 g | PACK | Freq: Every day | ORAL | Status: DC

## 2023-02-16 MED ORDER — AMOXICILLIN-POT CLAVULANATE 875-125 MG PO TABS: 1.0000 | ORAL_TABLET | Freq: Two times a day (BID) | ORAL | 0 refills | Status: AC

## 2023-02-16 MED ORDER — DOCUSATE SODIUM 100 MG PO CAPS: 100.0000 mg | ORAL_CAPSULE | Freq: Two times a day (BID) | ORAL | 0 refills | Status: DC

## 2023-02-16 MED ORDER — POLYETHYLENE GLYCOL 3350 17 G PO PACK: 17.0000 g | PACK | Freq: Every day | ORAL | 0 refills | Status: AC | PRN

## 2023-02-16 NOTE — Plan of Care (Signed)
  Problem: Coping: Goal: Ability to adjust to condition or change in health will improve Outcome: Progressing   Problem: Education: Goal: Knowledge of General Education information will improve Description: Including pain rating scale, medication(s)/side effects and non-pharmacologic comfort measures Outcome: Progressing   Problem: Health Behavior/Discharge Planning: Goal: Ability to manage health-related needs will improve Outcome: Progressing   Problem: Clinical Measurements: Goal: Respiratory complications will improve Outcome: Progressing   Problem: Clinical Measurements: Goal: Cardiovascular complication will be avoided Outcome: Progressing   Problem: Activity: Goal: Risk for activity intolerance will decrease Outcome: Progressing   Problem: Pain Managment: Goal: General experience of comfort will improve and/or be controlled Outcome: Progressing   Problem: Safety: Goal: Ability to remain free from injury will improve Outcome: Progressing

## 2023-02-16 NOTE — Discharge Summary (Signed)
Physician Discharge Summary   Patient: [name] MRN: [id number] DOB: [birth year]  Admit date:     02/08/2023  Discharge date: 02/16/23  Discharge Physician: Loyce Dys   PCP: Sherlene Shams, MD   Recommendations at discharge:  Follow-up with cardiology Discharge Diagnoses:  Acute GI bleeding Diverticulitis.  Coronary artery disease Lactic acidosis Diabetes mellitus (HCC) Chronic HFrEF (heart failure with reduced ejection fraction) (HCC) Essential hypertension OSA on CPAP    Hospital Course:  [name] is a 78 y.o. 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    Patient had total of 3 episodes of bright red blood per rectum, she was on aspirin and Brilinta due to recent PCI done in November 2024.   On presentation hemodynamically stable, labs with leukocytosis at 20.2, hemoglobin 11.7, decreased from 14.3  39-month ago, CO2 of 20, blood glucose 231, BUN 22, creatinine 1.02. CT angio abdomen for GI protocol with concern of acute inflammation involving the right colon near hepatic flexure, most compatible with acute diverticulitis.  No evidence of active GI bleeding.  Also noted a small right adrenal adenoma and a small hiatal hernia.   Patient was started on Zosyn.  GI and cardiology are both consulted.   Cardiology recommended continue with aspirin and hold Brilinta for now.  Patient is high risk for restenosis of stents.   Gastroenterology recommended RBC tagged scan if bleeding persists.   1/24: Hemodynamically stable, hemoglobin at 10.6, slight worsening of leukocytosis at 21.1.  BNP of 150, lactic acid 3 >>3.8 Had another episode of small amount of bleeding per rectum, RBC tagged scan ordered.  Also giving some more gentle IV fluid   1/25: Patient continued to have bleeding  per rectum, overnight 5 episodes recorded.  Hemoglobin decreased to 9.1>>8.4 today.  Worsening lactic acidosis after some improvement.  Continuing aspirin per cardiology and keep holding Brilinta.  RBC tagged scan was negative. Ordered 1 unit of PRBC.    1/26: Continue to bleed, hemoglobin was 7.9 this morning after getting 1 unit yesterday.  Patient was sent for another RBC tagged scan, towards the end of the scan she felt little chest pressure and at the end she suddenly became hypotensive, cold and clammy, rapid response was called.  Patient was given emergent blood transfusion and 1 L of bolus and transferred to ICU.  RBC tagged scan came back positive for active bleeding just below hepatic flexure, IR is going to do embolization as she continued to have significant bleeding per rectum. Emergency protocol blood transfusion and FFP ordered. Blood pressure responding to IV fluid and transfusions. Ordered troponin, cardiology and GI are also aware.  PCCM on board   1/27: 1 episode of small amount of bleeding overnight and 1 dark-colored stool during the day.  Patient had her mesenteric angiography with IR yesterday evening, there was no active bleeding at that time, no embolization was done.  Patient with hemoglobin of 8.4 this morning s/p total of 4 unit of PRBC and 2 units of FFP. Cardiology is planning to do Brilinta monotherapy once bleeding resolved.  They also requested genotype testing for clopidogrel with the plan to transition her from ticagrelor to clopidogrel once results are back and if she is response.  Losing most of her IV access and a very hard stick, eventually  agreed to place a PICC line which was ordered.   1/28: Continue to have some dark-colored stool but no other active bleeding, hemoglobin at 7.3 this morning-ordered 2 more units of PRBC. Plan is to do 2 weeks of antibiotics for diverticulitis due to this persistent bleeding, we can switch to p.o. once bleeding resolved.    1/28-1/31: Patient's hemoglobin remained stable after transfusion.  Cardiologist resumed Brilinta and discontinued aspirin.  No longer having any bleeding per rectum.  Has been cleared for discharge today and to follow-up with cardiologist.  Consultants: Cardiology, gastroenterology Procedures performed: As mentioned above Disposition: Home Diet recommendation:  Cardiac diet DISCHARGE MEDICATION: Allergies as of 02/16/2023       Reactions   Meloxicam Other (See Comments)   Hypertension, tachycardia   Statins Other (See Comments)   "Muscle weakness"   Sulfa Drugs Cross Reactors    Zostavax [zoster Vaccine Live]    Bad reaction        Medication List     STOP taking these medications    aspirin EC 81 MG tablet   losartan 25 MG tablet Commonly known as: COZAAR   metoprolol succinate 25 MG 24 hr tablet Commonly known as: TOPROL-XL   ondansetron 4 MG tablet Commonly known as: ZOFRAN   tildrakizumab-asmn 100 MG/ML subcutaneous injection Commonly known as: ILUMYA       TAKE these medications    albuterol 108 (90 Base) MCG/ACT inhaler Commonly known as: VENTOLIN HFA Inhale 2 puffs into the lungs every 6 (six) hours as needed for wheezing or shortness of breath.   ALPRAZolam 0.25 MG tablet Commonly known as: XANAX Take 1 tablet (0.25 mg total) by mouth 2 (two) times daily as needed for anxiety.   amoxicillin-clavulanate 875-125 MG tablet Commonly known as: AUGMENTIN Take 1 tablet by mouth 2 (two) times daily for 5 days.   Brilinta 90 MG Tabs tablet Generic drug: ticagrelor Take 1 tablet (90 mg total) by mouth 2 (two) times daily.   budesonide 180 MCG/ACT inhaler Commonly known as: PULMICORT Inhale 2 puffs into the lungs 2 (two) times daily.   carvedilol 3.125 MG tablet Commonly known as: COREG Take 1 tablet (3.125 mg total) by mouth 2 (two) times daily with a meal.   cetirizine 10 MG tablet Commonly known as: ZYRTEC Take 10 mg by mouth daily.    docusate sodium 100 MG capsule Commonly known as: COLACE Take 1 capsule (100 mg total) by mouth 2 (two) times daily.   EPINEPHrine 0.3 mg/0.3 mL Soaj injection Commonly known as: EPI-PEN as needed.   gabapentin 100 MG capsule Commonly known as: NEURONTIN Take 1 capsule (100 mg total) by mouth 3 (three) times daily. If no improvement after 4 or 5 days increase to 2 capsules 3 times a day   Lumigan 0.01 % Soln Generic drug: bimatoprost Place 1 drop into both eyes at bedtime.   MILK THISTLE PO Take 1 capsule by mouth daily.   nitroGLYCERIN 0.4 MG SL tablet Commonly known as: NITROSTAT Place 1 tablet (0.4 mg total) under the tongue every 5 (five) minutes for up to 3 doses as needed for chest pain.   polyethylene glycol 17 g packet Commonly known as: MIRALAX / GLYCOLAX Take 17 g by mouth daily as needed for mild constipation.   PROBIOTIC COMPLEX ACIDOPHILUS PO Take 1 capsule by mouth daily.   sodium chloride 0.65 % Soln nasal spray Commonly known as: OCEAN Place 1 spray into the nose as needed.  Discharge Exam: Filed Weights   02/08/23 1333 02/09/23 1941 02/11/23 1524  Weight: 88 kg 90.9 kg 92.6 kg   General.  Overweight lady, in no acute distress. Pulmonary.  Lungs clear bilaterally, normal respiratory effort. CV.  Regular rate and rhythm, no JVD, rub or murmur. Abdomen.  Soft, nontender, nondistended, BS positive. CNS.  Alert and oriented .  No focal neurologic deficit. Extremities.  No edema, no cyanosis, pulses intact and symmetrical. Psychiatry.  Judgment and insight appears normal.     Condition at discharge: good  The results of significant diagnostics from this hospitalization (including imaging, microbiology, ancillary and laboratory) are listed below for reference.   Imaging Studies: [place] EKG SITE RITE Result Date: 02/12/2023 If Site Rite image not attached, placement could not be confirmed due to current cardiac rhythm.  IR Angiogram Visceral  Selective Result Date: 02/12/2023 CLINICAL DATA:  GI bleed. Negative CTA abdomen. Positive tagged red blood cell scintigraphy demonstrating extravasation from the region of the hepatic flexure of the colon. EXAM: SELECTIVE VISCERAL ARTERIOGRAPHY PROCEDURE: The procedure, risks, benefits, and alternatives were reviewed with the the patient and family. Questions regarding the procedure were encouraged and answered. The patient understands and consents to the procedure. The right femoral region was prepped with chlorhexidine in a sterile fashion, and a sterile drape was applied covering the operative field. A sterile gown and sterile gloves were used for the procedure. Local anesthesia was provided with 1% Lidocaine. Intravenous Fentanyl and Versed 1.5mg  were administered by RN during a total moderate (conscious) sedation time of 75 minutes; the patient's level of consciousness and physiological / cardiorespiratory status were monitored continuously by radiology RN under my direct supervision. After an appropriate skin entry site was confirmed under ultrasound, the right common femoral artery was accessed over the femoral head with a 21 gauge micropuncture needle using single-wall technique in a single pass under real time ultrasound guidance. Ultrasound documentation was stored. The needle was exchanged over a 018 wire for a transitional dilator allowing passage of a Benson wire over which a 5-French vascular sheath was placed, through which a C2 Catheter was advanced. The SMA was selectively catheterized for arteriography. A coaxial renegade micro catheter was then advanced for sub selective arteriography of 2 separate third order SMA branch territories supplying the hepatic flexure of the colon, ascending and transverse segments. Arteriograms performed with microcatheter in multiple projections. Subsequently, non selective SMA arteriography was performed in multiple projections. The catheter was then removed  and hemostasis achieved at the site using the Exoseal device after confirmatory femoral arteriography. The patient tolerated the procedure well. ANESTHESIA/SEDATION: Intravenous Fentanyl and Versed 1.5mg  were administered by RN during a total moderate (conscious) sedation time of 75 minutes; the patient's level of consciousness and physiological / cardiorespiratory status were monitored continuously by radiology RN under my direct supervision. FLUOROSCOPY TIME:  Radiation Exposure Index (as provided by the fluoroscopic device): 1972 mGy air Kerma MEDICATIONS: Lidocaine 1% subcutaneous CONTRAST:  92mL OMNIPAQUE IOHEXOL 300 MG/ML  SOLN FINDINGS: Superior mesenteric arteriography demonstrates classic distal branch anatomy. Microcatheter sub selective arteriography of second and third order branches supplying the ascending and transverse segments of the colon demonstrate no extravasation or other focal lesion to suggest etiology of the hemorrhage noted on scintigraphy. Additional subsequent nonselective SMA arteriography in additional projections demonstrates no active extravasation or lesion corresponding to finding seen on scintigraphy. COMPLICATIONS: None immediate IMPRESSION: No active extravasation or other discrete lesion to localize bleeding source seen on scintigraphy. Embolization therefore  not performed. Electronically Signed   By: Corlis Leak M.D.   On: 02/12/2023 09:11   NM GI Blood Loss Result Date: 02/11/2023 CLINICAL DATA:  GI bleeding. EXAM: NUCLEAR MEDICINE GASTROINTESTINAL BLEEDING SCAN TECHNIQUE: Sequential abdominal images were obtained following intravenous administration of Tc-51m labeled red blood cells. RADIOPHARMACEUTICALS:  22.2 mCi Tc-49m pertechnetate in-vitro labeled red cells. COMPARISON:  02/09/2023 FINDINGS: Normal blood pool activity seen as well as activity within the bladder. Abnormal bowel activity is initially seen in the hepatic flexure of the colon on the 51-55 min image,  with subsequent progression throughout the transverse and descending colon. IMPRESSION: Active GI bleeding site identified in the hepatic flexure of the colon. Electronically Signed   By: Danae Orleans M.D.   On: 02/11/2023 14:43   NM GI Blood Loss Result Date: 02/09/2023 CLINICAL DATA:  Hematochezia.  Faintness. EXAM: NUCLEAR MEDICINE GASTROINTESTINAL BLEEDING SCAN TECHNIQUE: Sequential abdominal images were obtained following intravenous administration of Tc-60m labeled red blood cells. RADIOPHARMACEUTICALS:  19.0 mCi Tc-47m pertechnetate in-vitro labeled red cells. COMPARISON:  None Available. FINDINGS: Physiologic distribution of radiopharmaceutical activity is seen. No sites of active GI bleeding are seen. IMPRESSION: Negative.  No evidence of active GI bleeding. Electronically Signed   By: Danae Orleans M.D.   On: 02/09/2023 17:14   CT ANGIO GI BLEED Result Date: 02/08/2023 CLINICAL DATA:  GI bleed. EXAM: CTA ABDOMEN AND PELVIS WITHOUT AND WITH CONTRAST TECHNIQUE: Multidetector CT imaging of the abdomen and pelvis was performed using the standard protocol during bolus administration of intravenous contrast. Multiplanar reconstructed images and MIPs were obtained and reviewed to evaluate the vascular anatomy. RADIATION DOSE REDUCTION: This exam was performed according to the departmental dose-optimization program which includes automated exposure control, adjustment of the mA and/or kV according to patient size and/or use of iterative reconstruction technique. CONTRAST:  OMNIPAQUE IOHEXOL 350 MG/ML SOLN COMPARISON:  CT abdomen pelvis 05/16/2017 FINDINGS: VASCULAR Aorta: Atherosclerotic disease in the abdominal aorta without aneurysm, dissection or significant stenosis. Celiac: Narrowing of the proximal celiac artery appears to be related to median arcuate ligament compression. SMA: Patent without evidence of aneurysm, dissection, vasculitis or significant stenosis. Renals: Both renal arteries are  patent without evidence of aneurysm, dissection, vasculitis, fibromuscular dysplasia or significant stenosis. IMA: Patent without evidence of aneurysm, dissection, vasculitis or significant stenosis. Inflow: Patent without evidence of aneurysm, dissection, vasculitis or significant stenosis. Proximal Outflow: Proximal femoral arteries are patent bilaterally. Veins: Portal venous system is patent. No abnormality to the IVC or iliac veins. Bilateral renal veins are patent. Review of the MIP images confirms the above findings. NON-VASCULAR Lower chest: Lung bases are clear. Hepatobiliary: Small low-density structures in the liver likely represent small hepatic cysts. No biliary dilatation. Normal appearance of the gallbladder. Pancreas: Unremarkable. No pancreatic ductal dilatation or surrounding inflammatory changes. Spleen: Normal in size without focal abnormality. Adrenals/Urinary Tract: Small nodule in the right adrenal gland measures 8 mm and the Hounsfield units are 6 on the noncontrast images. This is suggestive for a small adrenal adenoma. Normal appearance of the left adrenal gland. No hydronephrosis. No suspicious renal lesions. Normal appearance of the urinary bladder. No renal calculi. Stomach/Bowel: No evidence for active GI bleeding. Small hiatal hernia. Normal appearance of the stomach. Normal appearance of the small bowel. Pericolonic inflammation involving the right colon near the hepatic flexure and there are diverticula in this area. Findings are suggestive for acute diverticulitis. Normal appendix. Lymphatic: No significant lymph node enlargement in the abdomen or pelvis. Reproductive: Status  post hysterectomy. No adnexal masses. Other: Periumbilical ventral hernia containing fat. No evidence for free fluid. Negative for free air. Musculoskeletal: No acute bone abnormality. IMPRESSION: VASCULAR 1. Atherosclerotic disease in the abdominal aorta without aneurysm. Aortic Atherosclerosis (ICD10-I70.0).  2. Narrowing of the proximal celiac artery likely related to median arcuate ligament compression. NON-VASCULAR 1. Acute inflammation involving the right colon near the hepatic flexure. Findings are most compatible with acute diverticulitis. 2. No evidence for active GI bleeding. 3. Small periumbilical hernia containing fat. 4. Small right adrenal adenoma. 5. Small hiatal hernia. Electronically Signed   By: Richarda Overlie M.D.   On: 02/08/2023 15:48   ECHOCARDIOGRAM LIMITED Result Date: 01/30/2023    ECHOCARDIOGRAM LIMITED REPORT   Patient Name:   ARCENIA SCARBRO Date of Exam: 01/30/2023 Medical Rec #:  [id number]     Height:       66.0 in Accession #:    [id number]    Weight:       196.8 lb Date of Birth:  05-Feb-1945      BSA:          1.986 m Patient Age:    77 years      BP:           134/84 mmHg Patient Gender: F             HR:           61 bpm. Exam Location:  [place] Procedure: 2D Echo, Limited Echo, Cardiac Doppler, Limited Color Doppler and            Intracardiac Opacification Agent Indications:    I50.40* Unspecified combined systolic (congestive) and diastolic                 (congestive) heart failure  History:        Patient has prior history of Echocardiogram examinations, most                 recent 11/18/2022. CHF and Cardiomyopathy, CAD and Previous                 Myocardial Infarction, Abnormal ECG, PAD, Signs/Symptoms:Chest                 Pain and Edema; Risk Factors:Sleep Apnea, Former Smoker,                 Dyslipidemia, Diabetes and Hypertension.  Sonographer:    Ilda Mori MHA, BS, RDCS Referring Phys: [id number] CADENCE H FURTH  Sonographer Comments: Suboptimal apical window and patient is obese. Image acquisition challenging due to patient body habitus. IMPRESSIONS  1. Left ventricular ejection fraction, by estimation, is 45 to 50%. The left ventricle has moderately decreased function. The left ventricle demonstrates global hypokinesis. Left ventricular diastolic parameters are consistent  with Grade I diastolic dysfunction (impaired relaxation).  2. Right ventricular systolic function is normal. The right ventricular size is normal.  3. The mitral valve is normal in structure. Mild mitral valve regurgitation. No evidence of mitral stenosis.  4. The aortic valve is calcified. There is moderate calcification of the aortic valve. Aortic valve regurgitation is not visualized. Mild aortic valve stenosis. Aortic valve mean gradient measures 13.0 mmHg.  5. The inferior vena cava is normal in size with greater than 50% respiratory variability, suggesting right atrial pressure of 3 mmHg. FINDINGS  Left Ventricle: Left ventricular ejection fraction, by estimation, is 45 to 50%. The left ventricle has moderately decreased function. The left ventricle demonstrates global hypokinesis.  Definity contrast agent was given IV to delineate the left ventricular endocardial borders. 3D ejection fraction reviewed and evaluated as part of the interpretation. Alternate measurement of EF is felt to be most reflective of LV function. There is no left ventricular hypertrophy. Abnormal (paradoxical) septal motion, consistent with left bundle branch block. Left ventricular diastolic parameters are consistent with Grade I diastolic dysfunction (impaired relaxation). Right Ventricle: The right ventricular size is normal. No increase in right ventricular wall thickness. Right ventricular systolic function is normal. Left Atrium: Left atrial size was normal in size. Right Atrium: Right atrial size was normal in size. Pericardium: There is no evidence of pericardial effusion. Mitral Valve: The mitral valve is normal in structure. Mild mitral valve regurgitation. No evidence of mitral valve stenosis. Tricuspid Valve: The tricuspid valve is normal in structure. Tricuspid valve regurgitation is not demonstrated. No evidence of tricuspid stenosis. Aortic Valve: The aortic valve is calcified. There is moderate calcification of the aortic  valve. Aortic valve regurgitation is not visualized. Mild aortic stenosis is present. Aortic valve mean gradient measures 13.0 mmHg. Aortic valve peak gradient measures 20.8 mmHg. Pulmonic Valve: The pulmonic valve was normal in structure. Pulmonic valve regurgitation is not visualized. No evidence of pulmonic stenosis. Aorta: The aortic root is normal in size and structure. Venous: The inferior vena cava is normal in size with greater than 50% respiratory variability, suggesting right atrial pressure of 3 mmHg. IAS/Shunts: No atrial level shunt detected by color flow Doppler.  Diastology LV e' medial:    3.26 cm/s LV E/e' medial:  17.9 LV e' lateral:   5.55 cm/s LV E/e' lateral: 10.5  AORTIC VALVE AV Vmax:           228.00 cm/s AV Vmean:          171.000 cm/s AV VTI:            0.537 m AV Peak Grad:      20.8 mmHg AV Mean Grad:      13.0 mmHg LVOT Vmax:         72.80 cm/s LVOT Vmean:        48.200 cm/s LVOT VTI:          0.138 m LVOT/AV VTI ratio: 0.26 MITRAL VALVE MV Area (PHT): 2.73 cm    SHUNTS MV Decel Time: 278 msec    Systemic VTI: 0.14 m MV E velocity: 58.20 cm/s MV A velocity: 91.70 cm/s MV E/A ratio:  0.63 Julien Nordmann MD Electronically signed by Julien Nordmann MD Signature Date/Time: 01/30/2023/6:13:44 PM    Final     Microbiology: Results for orders placed or performed during the hospital encounter of 02/08/23  Blood culture (routine x 2)     Status: None   Collection Time: 02/08/23  7:10 PM   Specimen: BLOOD  Result Value Ref Range Status   Specimen Description BLOOD BLOOD LEFT HAND  Final   Special Requests   Final    BOTTLES DRAWN AEROBIC AND ANAEROBIC Blood Culture results may not be optimal due to an inadequate volume of blood received in culture bottles   Culture   Final    NO GROWTH 5 DAYS Performed at [facility], [address], [place], [place] [address]    Report Status 02/13/2023 FINAL  Final  Blood culture (routine x 2)     Status: None   Collection Time:  02/08/23  7:10 PM   Specimen: BLOOD  Result Value Ref Range Status   Specimen Description  BLOOD BLOOD RIGHT HAND  Final   Special Requests   Final    BOTTLES DRAWN AEROBIC AND ANAEROBIC Blood Culture results may not be optimal due to an inadequate volume of blood received in culture bottles   Culture   Final    NO GROWTH 5 DAYS Performed at [facility], [address], [place], [place] [address]    Report Status 02/13/2023 FINAL  Final  MRSA Next Gen by PCR, Nasal     Status: None   Collection Time: 02/11/23  3:53 PM   Specimen: Nasal Mucosa; Nasal Swab  Result Value Ref Range Status   MRSA by PCR Next Gen NOT DETECTED NOT DETECTED Final    Comment: (NOTE) The GeneXpert MRSA Assay (FDA approved for NASAL specimens only), is one component of a comprehensive MRSA colonization surveillance program. It is not intended to diagnose MRSA infection nor to guide or monitor treatment for MRSA infections. Test performance is not FDA approved in patients less than 69 years old. Performed at [facility], [address] [place], [place] [address]     Labs: CBC: Recent Labs  Lab 02/12/23 0302 02/12/23 6578 02/13/23 0507 02/13/23 1529 02/14/23 0843 02/14/23 1552 02/14/23 2215 02/15/23 0400 02/16/23 0433  WBC 19.6*  --  14.6*  --  15.3*  --   --  12.9* 12.7*  NEUTROABS  --   --   --   --   --   --   --  9.0* 7.7  HGB 8.4*   < > 7.3*   < > 11.3* 11.1* 11.5* 11.4* 11.7*  HCT 24.0*   < > 21.1*   < > 31.9* 31.5* 33.0* 33.0* 33.9*  MCV 83.0  --  87.2  --  83.3  --   --  85.9 87.1  PLT 193  --  184  --  208  --   --  221 256   < > = values in this interval not displayed.   Basic Metabolic Panel: Recent Labs  Lab 02/12/23 0302 02/12/23 0307 02/13/23 0507 02/14/23 0440 02/15/23 0400 02/16/23 0433  NA  --  139 141 139 140 140  K  --  3.8 3.5 3.5 3.6 3.7  CL  --  107 108 105 106 106  CO2  --  22 25 24 26 25   GLUCOSE  --  108* 123* 94 106* 106*  BUN  --  22 15  13 12 12   CREATININE  --  0.69 0.75 0.76 0.70 0.80  CALCIUM  --  7.8* 7.6* 8.1* 8.2* 8.1*  MG 2.4  --   --   --   --   --   PHOS  --  3.6 3.8 3.9  --   --    Liver Function Tests: Recent Labs  Lab 02/12/23 0307 02/13/23 0507 02/14/23 0440  ALBUMIN 2.7* 2.5* 2.7*   CBG: Recent Labs  Lab 02/12/23 0402 02/13/23 0753 02/14/23 0439 02/14/23 2106 02/16/23 0424  GLUCAP 109* 99 93 119* 105*    Discharge time spent:  34 minutes.  Signed: Loyce Dys, MD [facility] 02/16/2023

## 2023-02-16 NOTE — Progress Notes (Signed)
   02/16/23 0945  Spiritual Encounters  Type of Visit Follow up  Care provided to: Pt and family (Husband at bedside)  Conversation partners present during encounter Physician  Referral source Chaplain assessment  Reason for visit Routine spiritual support  OnCall Visit No  Spiritual Framework  Presenting Themes Meaning/purpose/sources of inspiration;Values and beliefs;Impactful experiences and emotions;Courage hope and growth  Interventions  Spiritual Care Interventions Made Compassionate presence;Reflective listening;Normalization of emotions;Explored values/beliefs/practices/strengths;Meaning making;Prayer;Self-care teaching  Intervention Outcomes  Outcomes Connection to spiritual care;Awareness around self/spiritual resourses;Connection to values and goals of care;Reduced anxiety

## 2023-02-16 NOTE — Progress Notes (Signed)
Rounding Note    Patient Name: [name] Date of Encounter: 02/16/2023  [facility] Cardiologist: Lorine Bears, MD   Subjective   No GI bleeding in the last 48 hours even after resuming Brilinta.  Inpatient Medications    Scheduled Meds:  Chlorhexidine Gluconate Cloth  6 each Topical Daily   docusate sodium  100 mg Oral BID   gabapentin  200 mg Oral TID   latanoprost  1 drop Both Eyes QHS   pantoprazole (PROTONIX) IV  40 mg Intravenous Q12H   sodium chloride flush  10-40 mL Intracatheter Q12H   sodium chloride flush  3 mL Intravenous Q12H   ticagrelor  90 mg Oral BID   Continuous Infusions:  piperacillin-tazobactam (ZOSYN)  IV 3.375 g (02/16/23 0839)   PRN Meds: acetaminophen **OR** acetaminophen, ALPRAZolam, guaiFENesin-dextromethorphan, ondansetron **OR** ondansetron (ZOFRAN) IV, mouth rinse, polyethylene glycol, sodium chloride, sodium chloride flush   Vital Signs    Vitals:   02/15/23 1755 02/15/23 2000 02/16/23 0039 02/16/23 0426  BP: 139/63 (!) 141/78 (!) 143/89 134/65  Pulse: 94     Resp:  20 18 20   Temp: 97.7 F (36.5 C) 97.6 F (36.4 C) 98.5 F (36.9 C) 98.6 F (37 C)  TempSrc:  Oral Oral Oral  SpO2: 98% 99% 98% 99%  Weight:      Height:        Intake/Output Summary (Last 24 hours) at 02/16/2023 1145 Last data filed at 02/16/2023 0900 Gross per 24 hour  Intake 820 ml  Output --  Net 820 ml      02/11/2023    3:24 PM 02/09/2023    7:41 PM 02/08/2023    1:33 PM  Last 3 Weights  Weight (lbs) 204 lb 2.3 oz 200 lb 6.4 oz 194 lb 0.1 oz  Weight (kg) 92.6 kg 90.9 kg 88 kg      Telemetry    Sinus rhythm with PACs and PVCs - Personally Reviewed  Physical Exam   GEN: No acute distress.   Neck: No JVD Cardiac: RRR, no murmurs, rubs, or gallops.  Respiratory: Clear to auscultation bilaterally. GI: Soft, nontender, non-distended  MS: No edema; No deformity. Neuro:  Nonfocal  Psych: Normal affect   Labs    High Sensitivity  Troponin:   Recent Labs  Lab 02/11/23 2025 02/12/23 0302  TROPONINIHS 9 8     Chemistry Recent Labs  Lab 02/12/23 0302 02/12/23 0307 02/12/23 0307 02/13/23 0507 02/14/23 0440 02/15/23 0400 02/16/23 0433  NA  --  139   < > 141 139 140 140  K  --  3.8   < > 3.5 3.5 3.6 3.7  CL  --  107   < > 108 105 106 106  CO2  --  22   < > 25 24 26 25   GLUCOSE  --  108*   < > 123* 94 106* 106*  BUN  --  22   < > 15 13 12 12   CREATININE  --  0.69   < > 0.75 0.76 0.70 0.80  CALCIUM  --  7.8*   < > 7.6* 8.1* 8.2* 8.1*  MG 2.4  --   --   --   --   --   --   ALBUMIN  --  2.7*  --  2.5* 2.7*  --   --   GFRNONAA  --  >60   < > >60 >60 >60 >60  ANIONGAP  --  10   < >  8 10 8 9    < > = values in this interval not displayed.    Lipids No results for input(s): "CHOL", "TRIG", "HDL", "LABVLDL", "LDLCALC", "CHOLHDL" in the last 168 hours.  Hematology Recent Labs  Lab 02/14/23 0843 02/14/23 1552 02/14/23 2215 02/15/23 0400 02/16/23 0433  WBC 15.3*  --   --  12.9* 12.7*  RBC 3.83*  --   --  3.84* 3.89  HGB 11.3*   < > 11.5* 11.4* 11.7*  HCT 31.9*   < > 33.0* 33.0* 33.9*  MCV 83.3  --   --  85.9 87.1  MCH 29.5  --   --  29.7 30.1  MCHC 35.4  --   --  34.5 34.5  RDW 17.0*  --   --  17.0* 16.7*  PLT 208  --   --  221 256   < > = values in this interval not displayed.   Thyroid No results for input(s): "TSH", "FREET4" in the last 168 hours.  BNP No results for input(s): "BNP", "PROBNP" in the last 168 hours.   DDimer No results for input(s): "DDIMER" in the last 168 hours.   Radiology    No results found.   Cardiac Studies   Echo limited 01/2023 1. Left ventricular ejection fraction, by estimation, is 45 to 50%. The  left ventricle has moderately decreased function. The left ventricle  demonstrates global hypokinesis. Left ventricular diastolic parameters are  consistent with Grade I diastolic  dysfunction (impaired relaxation).   2. Right ventricular systolic function is normal. The  right ventricular  size is normal.   3. The mitral valve is normal in structure. Mild mitral valve  regurgitation. No evidence of mitral stenosis.   4. The aortic valve is calcified. There is moderate calcification of the  aortic valve. Aortic valve regurgitation is not visualized. Mild aortic  valve stenosis. Aortic valve mean gradient measures 13.0 mmHg.   5. The inferior vena cava is normal in size with greater than 50%  respiratory variability, suggesting right atrial pressure of 3 mmHg.    LHC staged PCI 12/11/22   Ost LM lesion is 30% stenosed.   Prox LAD to Mid LAD lesion is 30% stenosed.   Mid LAD lesion is 40% stenosed.   Mid LAD to Dist LAD lesion is 30% stenosed.   Prox RCA to Mid RCA lesion is 30% stenosed.   Dist RCA lesion is 70% stenosed with 90% stenosed side branch in RPDA.   1st Diag lesion is 50% stenosed.   Ost RCA to Prox RCA lesion is 50% stenosed.   RPAV lesion is 40% stenosed.   Non-stenotic Prox Cx to Mid Cx lesion was previously treated.   Non-stenotic 1st Mrg lesion was previously treated.   A drug-eluting stent was successfully placed using a STENT ONYX FRONTIER 2.5X22.   Post intervention, there is a 0% residual stenosis.   Post intervention, the side branch was reduced to 0% residual stenosis. 1.  Widely patent left circumflex stent with no significant restenosis.  Severe distal RCA stenosis extending into the right PDA.  Moderate ostial RCA stenosis with pressure dampening with guide catheter engagement.  However, there was no pressure dampening with diagnostic catheter engagement during most recent angiography. 2.  Left ventricular angiography was not performed.  Mildly elevated left ventricular end-diastolic pressure at 18 mmHg. 3.  Successful angioplasty and drug-eluting stent placement to the distal right coronary artery extending into the right PDA.   LHC 11/20/23  Prox RCA lesion is 30% stenosed.   Dist RCA lesion is 70% stenosed with 90% stenosed  side branch in RPDA.   Prox LAD to Mid LAD lesion is 30% stenosed.   1st Diag lesion is 50% stenosed.   Mid LAD lesion is 40% stenosed.   Mid LAD to Dist LAD lesion is 30% stenosed.   Ost LM lesion is 30% stenosed.   1st Mrg lesion is 99% stenosed.   Prox Cx to Mid Cx lesion is 90% stenosed.   A drug-eluting stent was successfully placed using a STENT ONYX FRONTIER 2.5X26.   A drug-eluting stent was successfully placed using a STENT ONYX FRONTIER 2.5X22.   Post intervention, there is a 0% residual stenosis.   Post intervention, there is a 0% residual stenosis. 1.  Diffuse and moderately calcified coronary arteries with severe two-vessel coronary artery disease.  The culprit for myocardial infarction seems to be plaque rupture and OM1 and mid left circumflex.  In addition, there is severe stenosis in the distal RCA extending into the ostium of the right PDA.  The LAD has moderate disease in multiple areas. 2.  Left ventricular angiography was not performed.  EF was moderately reduced by echo.  LVEDP was mildly elevated. 3.  Successful complex bifurcation angioplasty and kissing stent placement to OM1/mid left circumflex using 2 drug-eluting stents.  Patient Profile     [name] is a 78 y.o. 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   Assessment & Plan    Diverticulitis GI bleed Anemia -No further bleeding in the last 48 hours and her hemoglobin has been stable even after resuming Brilinta.  CAD s/p PCI 11/2022 - Left heart catheterization 11//24 showed diffuse moderately calcified arteries with severe two-vessel CAD. She underwent successful bifurcation angioplasty with kissing stent placement to OM1/mid left circumflex using 2  DES. 12/11/2018 staged PCI of the RCA with widely patent left circumflex and mildly elevated LVEDP she was recommended to continue with DAPT with aspirin and Brilinta for 12 months  -Given GI bleed, will treat with Brilinta monotherapy without aspirin.  Avoid all NSAIDs. We sent genetic testing for clopidogrel.  If she is responsive, we will plan on switching her to clopidogrel in the outpatient setting. Recommend resuming Coreg 3.125 mg twice daily.  Hyperlipidemia - Longstanding history of statin intolerance, failed atorvastatin and rosuvastatin - Consider PCSK9i or inclisiran as outpatient  The patient can be discharged home from our standpoint and will arrange for a follow-up visit in 2 weeks.  For questions or updates, please contact  [facility] Please consult [email or web address] for contact info under   Signed, Lorine Bears, MD  02/16/2023, 11:45 AM

## 2023-02-19 ENCOUNTER — Telehealth: Payer: Self-pay

## 2023-02-19 NOTE — Transitions of Care (Post Inpatient/ED Visit) (Signed)
02/19/2023  Name: [name] MRN: [id number] DOB: 09-27-1945  Today's TOC FU Call Status: Today's TOC FU Call Status:: Successful TOC FU Call Completed TOC FU Call Complete Date: 02/19/23 Patient's Name and Date of Birth confirmed.  Transition Care Management Follow-up Telephone Call Date of Discharge: 02/16/23 Discharge Facility: [facility]) Type of Discharge: Inpatient Admission Primary Inpatient Discharge Diagnosis:: GI Bleed How have you been since you were released from the hospital?: Better Any questions or concerns?: No  Items Reviewed: Did you receive and understand the discharge instructions provided?: Yes Medications obtained,verified, and reconciled?: Yes (Medications Reviewed) Any new allergies since your discharge?: No Dietary orders reviewed?: Yes Type of Diet Ordered:: Cardiac Do you have support at home?: Yes People in Home: alone, spouse Name of Support/Comfort Primary Source: Fayrene Fearing  Medications Reviewed Today: Medications Reviewed Today     Reviewed by Redge Gainer, RN (Case Manager) on 02/19/23 at 1202  Med List Status: <None>   Medication Order Taking? Sig Documenting Provider Last Dose Status Informant  albuterol (VENTOLIN HFA) 108 (90 Base) MCG/ACT inhaler [id number] No Inhale 2 puffs into the lungs every 6 (six) hours as needed for wheezing or shortness of breath. [provider] Taking Active Self, Pharmacy Records           Med Note Lawerance Sabal Feb 08, 2023  8:56 PM) prn  ALPRAZolam (XANAX) 0.25 MG tablet [id number] No Take 1 tablet (0.25 mg total) by mouth 2 (two) times daily as needed for anxiety. Sherlene Shams, MD Taking Active Self, Pharmacy Records           Med Note Lawerance Sabal Feb 08, 2023  8:54 PM) prn  amoxicillin-clavulanate (AUGMENTIN) 875-125 MG tablet [id number]  Take 1 tablet by mouth 2 (two) times daily for 5 days. Loyce Dys, MD  Active   bimatoprost (LUMIGAN)  0.01 % SOLN [id number] No Place 1 drop into both eyes at bedtime.  02/07/2023 Active Self, Pharmacy Records  budesonide (PULMICORT) 180 MCG/ACT inhaler [id number] No Inhale 2 puffs into the lungs 2 (two) times daily.  Patient not taking: Reported on 02/08/2023   [provider] Not Taking Active Self, Pharmacy Records  carvedilol (COREG) 3.125 MG tablet [id number] No Take 1 tablet (3.125 mg total) by mouth 2 (two) times daily with a meal. Furth, Cadence H, PA-C 02/07/2023 Active Self, Pharmacy Records  cetirizine (ZYRTEC) 10 MG tablet [id number] No Take 10 mg by mouth daily. [provider] 02/07/2023 Active Self, Pharmacy Records  docusate sodium (COLACE) 100 MG capsule [id number]  Take 1 capsule (100 mg total) by mouth 2 (two) times daily. Loyce Dys, MD  Active   EPINEPHrine 0.3 mg/0.3 mL IJ SOAJ injection [id number] No as needed. [provider] Taking Active Self, Pharmacy Records           Med Note Maisie Fus, Jolly Mango   Thu Feb 08, 2023  8:59 PM) prn  gabapentin (NEURONTIN) 100 MG capsule [id number] No Take 1 capsule (100 mg total) by mouth 3 (three) times daily. If no improvement after 4 or 5 days increase to 2 capsules 3 times a day Tera Partridge, [place] 02/07/2023 Active Self, Pharmacy Records  MILK THISTLE PO [id number] No Take 1 capsule by mouth daily.  [provider] 02/07/2023 Active Self, Pharmacy Records  nitroGLYCERIN (NITROSTAT) 0.4 MG SL tablet [id number] No Place 1 tablet (0.4 mg total) under the tongue every 5 (five) minutes  for up to 3 doses as needed for chest pain. Sunnie Nielsen, DO Taking Active Self, Pharmacy Records           Med Note Lawerance Sabal Feb 08, 2023  8:55 PM) prn  polyethylene glycol (MIRALAX / GLYCOLAX) 17 g packet [id number]  Take 17 g by mouth daily as needed for mild constipation. Loyce Dys, MD  Active   Probiotic Product (PROBIOTIC COMPLEX ACIDOPHILUS PO) [id number] No Take 1 capsule by mouth daily. [provider] 02/07/2023 Active Self, Pharmacy Records  sodium chloride (OCEAN) 0.65 % SOLN nasal spray [id number] No Place 1 spray into the nose as needed. [provider] Taking Active Self, Pharmacy Records           Med Note Lawerance Sabal Feb 08, 2023  8:58 PM) prn  ticagrelor (BRILINTA) 90 MG TABS tablet [id number] No Take 1 tablet (90 mg total) by mouth 2 (two) times daily. Fransico Michael, Cadence H, PA-C 02/07/2023 Active Self, Pharmacy Records            Home Care and Equipment/Supplies: Were Home Health Services Ordered?: NA Any new equipment or medical supplies ordered?: NA  Functional Questionnaire: Do you need assistance with bathing/showering or dressing?: No Do you need assistance with meal preparation?: Yes Do you need assistance with eating?: No Do you have difficulty maintaining continence: No Do you need assistance with getting out of bed/getting out of a chair/moving?: Yes Do you have difficulty managing or taking your medications?: No  Follow up appointments reviewed: PCP Follow-up appointment confirmed?: Yes Date of PCP follow-up appointment?: 03/07/23 Follow-up Provider: Dr. Darrick Huntsman Specialist [facility] Follow-up appointment confirmed?: Yes Date of Specialist follow-up appointment?: 03/02/23 Follow-Up Specialty Provider:: Cadence Fransico Michael Do you need transportation to your follow-up appointment?: No Do you understand care options if your condition(s) worsen?: Yes-patient verbalized understanding  SDOH Interventions Today    Flowsheet Row Most Recent Value  SDOH Interventions   Food Insecurity Interventions Intervention Not Indicated  Housing Interventions Intervention Not Indicated  Transportation Interventions Intervention Not Indicated  Utilities Interventions Intervention Not Indicated  Social Connections Interventions Intervention Not Indicated      Interventions Today    Flowsheet Row Most Recent Value  Chronic Disease   Chronic disease  during today's visit Other  [GI beeed, Diverticulitis]  General Interventions   General Interventions Discussed/Reviewed General Interventions Discussed  Education Interventions   Education Provided Provided Education  Provided Verbal Education On When to see the doctor, Medication  Nutrition Interventions   Nutrition Discussed/Reviewed Nutrition Discussed  Pharmacy Interventions   Pharmacy Dicussed/Reviewed Medications and their functions  Safety Interventions   Safety Discussed/Reviewed Fall Risk        TOC Outreach completed to the patient today. She is at home and feeling better. She states she 'feels like a pin cushion". Her blood count had dropped and she needed a transfusion. She has been discharged without home health and she states she doesn't need anything. Follow up appointments are scheduled. Declines further outreach.  The patient has been provided with contact information for the care management team and has been advised to call with any health-related questions or concerns. The patient verbalized understanding with current POC. The patient is directed to their insurance card regarding availability of benefits coverage.     Deidre Ala, BSN, RN [place]  [facility] - [employer] [facility] RN Care Manager [phone]

## 2023-02-21 ENCOUNTER — Other Ambulatory Visit: Payer: Self-pay

## 2023-02-22 LAB — CYTOCHROME P450 2C19: 2C19 Metabolic Activity:: NORMAL

## 2023-02-26 ENCOUNTER — Telehealth: Payer: Self-pay | Admitting: Internal Medicine

## 2023-02-26 DIAGNOSIS — K922 Gastrointestinal hemorrhage, unspecified: Secondary | ICD-10-CM

## 2023-02-26 NOTE — Telephone Encounter (Signed)
 Rescheduled to 03/02/23

## 2023-02-26 NOTE — Telephone Encounter (Signed)
 Patient need lab orders.

## 2023-02-28 ENCOUNTER — Encounter: Payer: Self-pay | Admitting: Oncology

## 2023-02-28 ENCOUNTER — Other Ambulatory Visit: Payer: Self-pay | Admitting: Family Medicine

## 2023-02-28 ENCOUNTER — Ambulatory Visit
Admission: RE | Admit: 2023-02-28 | Discharge: 2023-02-28 | Disposition: A | Discharge status: Home / Self Care | Payer: Medicare Other | Source: Ambulatory Visit | Attending: Family Medicine | Admitting: Family Medicine

## 2023-02-28 DIAGNOSIS — M5416 Radiculopathy, lumbar region: Secondary | ICD-10-CM | POA: Insufficient documentation

## 2023-02-28 NOTE — Progress Notes (Signed)
 Cardiology Office Note:  .   Date:  03/05/2023  ID:  [name], DOB May 07, 1945, MRN [id number] PCP: Sherlene Shams, MD  [facility] Providers Cardiologist:  Lorine Bears, MD {  History of Present Illness: .   [name] is a 78 y.o. 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   Patient was sent to the ER 11/17/2022 for chest pain and elevated troponin.  High-sensitivity troponin elevated to 1569.  EKG showed ST depression in the lateral leads.  Heart cath showed diffuse and moderately calcified arteries with severe two-vessel CAD.  Culprit was likely plaque rupture from OM1 and mid left circumflex treated with successful bifurcation angioplasty and kissing stent placement to OM1/mid left circumflex using 2 drug-eluting stents.  Patient was started on DAPT for least 12 months with aspirin and Brilinta.  Echo showed EF 35 to 40%, mild hypokinesis of the inferoseptal inferior wall and moderate HK of the entire anterolateral, inferolateral and anterior walls with normal RV SF and RVSP.  The patient underwent staged PCI of the RCA on 12/11/2022 and discharged on aspirin and Brilinta for least 12 months. Cath showed widely patent left circumflex and mildly elevated LVEDP.    Patient was seen December 2024 and reported that breathing was better.  She reported muscle ache on the left side after taking Crestor. Repeat echo showed LVEF 45-50%, G1DD, mild MR, mild aortic valve calcification with mild valve stenosis.  The patient was admitted late January 2025 for GI bleed.  Hemoglobin was 11.7 decreased from 14.3 two months prior.  Brilinta was held. ASA was discontinued. CT angio of the abdomen showed concern acute inflammation  involving the right colon near hepatic flexure, most compatible with acute diverticulitis.  No evidence of active GI bleed.  Patient was started on Zosyn.  Hemoglobin down to 8.4 and patient was transfused 1 unit of PRBCs.  The next day she continued to bleed and hemoglobin was down to 7.9 and she became hypotensive, cold, clammy and rapid response was called.  She was given emergent blood transfusion and 1 L bolus and transferred to the ICU.  RBC tagged scan came back positive for active bleeding just below the hepatic flexure.  IR performed embolization.  Emergency protocol blood transfusion and FFP were ordered patient ultimately received 2 more units of blood transfused. She had persistent bleeding and more blood was transfused. H&H remained stable and she was discharged home. Cardiology plan was for monotherapy with Brilinta. P450 test was sent and Dr. Kirke Corin planned to switch to Plavix if normal.  Today, the patient has MRI of the back next week for back pain. She has occasional dull chest pain that is brief. No shortness of breath. No further bleeding. She had a cough and is requesting tessalon pearls. Plan to switch to Plavix per Dr. Kirke Corin.    Studies Reviewed: .    Echo limited 01/2023 1. Left ventricular ejection fraction, by estimation, is 45 to 50%. The  left ventricle has moderately decreased function. The left ventricle  demonstrates global hypokinesis. Left ventricular diastolic parameters are  consistent with Grade I diastolic  dysfunction (impaired relaxation).   2. Right ventricular systolic function is normal. The right ventricular  size is normal.   3.  The mitral valve is normal in structure. Mild mitral valve  regurgitation. No evidence of mitral stenosis.   4. The aortic valve is calcified. There is moderate calcification of the  aortic valve. Aortic valve regurgitation is not visualized. Mild aortic  valve stenosis. Aortic valve mean gradient measures 13.0 mmHg.   5. The  inferior vena cava is normal in size with greater than 50%  respiratory variability, suggesting right atrial pressure of 3 mmHg.    LHC staged PCI 12/11/22   Ost LM lesion is 30% stenosed.   Prox LAD to Mid LAD lesion is 30% stenosed.   Mid LAD lesion is 40% stenosed.   Mid LAD to Dist LAD lesion is 30% stenosed.   Prox RCA to Mid RCA lesion is 30% stenosed.   Dist RCA lesion is 70% stenosed with 90% stenosed side branch in RPDA.   1st Diag lesion is 50% stenosed.   Ost RCA to Prox RCA lesion is 50% stenosed.   RPAV lesion is 40% stenosed.   Non-stenotic Prox Cx to Mid Cx lesion was previously treated.   Non-stenotic 1st Mrg lesion was previously treated.   A drug-eluting stent was successfully placed using a STENT ONYX FRONTIER 2.5X22.   Post intervention, there is a 0% residual stenosis.   Post intervention, the side branch was reduced to 0% residual stenosis. 1.  Widely patent left circumflex stent with no significant restenosis.  Severe distal RCA stenosis extending into the right PDA.  Moderate ostial RCA stenosis with pressure dampening with guide catheter engagement.  However, there was no pressure dampening with diagnostic catheter engagement during most recent angiography. 2.  Left ventricular angiography was not performed.  Mildly elevated left ventricular end-diastolic pressure at 18 mmHg. 3.  Successful angioplasty and drug-eluting stent placement to the distal right coronary artery extending into the right PDA.   LHC 11/20/23   Prox RCA lesion is 30% stenosed.   Dist RCA lesion is 70% stenosed with 90% stenosed side branch in RPDA.   Prox LAD to Mid LAD lesion is 30% stenosed.   1st Diag lesion is 50% stenosed.   Mid LAD lesion is 40% stenosed.   Mid LAD to Dist LAD lesion is 30% stenosed.   Ost LM lesion is 30% stenosed.   1st Mrg lesion is 99% stenosed.   Prox Cx to Mid Cx lesion is 90% stenosed.   A drug-eluting stent was successfully placed using a STENT ONYX FRONTIER  2.5X26.   A drug-eluting stent was successfully placed using a STENT ONYX FRONTIER 2.5X22.   Post intervention, there is a 0% residual stenosis.   Post intervention, there is a 0% residual stenosis. 1.  Diffuse and moderately calcified coronary arteries with severe two-vessel coronary artery disease.  The culprit for myocardial infarction seems to be plaque rupture and OM1 and mid left circumflex.  In addition, there is severe stenosis in the distal RCA extending into the ostium of the right PDA.  The LAD has moderate disease in multiple areas. 2.  Left ventricular angiography was not performed.  EF was moderately reduced by echo.  LVEDP was mildly elevated. 3.  Successful complex bifurcation angioplasty and kissing stent placement to OM1/mid left circumflex using 2 drug-eluting stents.      Physical Exam:   VS:  BP 115/72 (BP Location: Left Arm, Patient Position: Sitting, Cuff Size: Normal)   Pulse 78   Ht 5\' 6"  (1.676 m)   Wt 186 lb 3.2 oz (84.5 kg)  SpO2 97%   BMI 30.05 kg/m    Wt Readings from Last 3 Encounters:  03/02/23 186 lb 3.2 oz (84.5 kg)  02/11/23 204 lb 2.3 oz (92.6 kg)  02/02/23 194 lb 3.2 oz (88.1 kg)    GEN: Well nourished, well developed in no acute distress NECK: No JVD; No carotid bruits CARDIAC: RRR, no murmurs, rubs, gallops RESPIRATORY:  Clear to auscultation without rales, wheezing or rhonchi  ABDOMEN: Soft, non-tender, non-distended EXTREMITIES:  No edema; No deformity   ASSESSMENT AND PLAN: .    GI bleed Diverticulitis Recent admission for GIB and diverticulitis.Brilinta was held and ASA was eventually discontinued. She required multiple blood transfusions. RBC tagged scan showed active bleeding below the hepatic flexure. She did not require embolization. At discharge, Hgb 11.7, HCT 33.9.   She denies further bleeding. Plan to switch from Brilinta to Plavix. Follow-up CBC ordered.   CAD s/p PCI/DES 11/2022 Recent admitted for GIB requiring multiple units  of PRBCs. Brilinta transiently held and ASA stopped as above. She denies anginal symptoms. Cytochromic P450 came back normal, so we will switch Brilinta to Plavix. Start Plavix 300mg  once followed by 75mg  thereafter. Follow-up CBC has been ordered by PCP.   HFmrEF ICM Repeat echo showed improved EF 45-50%, G1DD, and mild valve disease. Losartan stopped in the hospital. Continue Coreg 3.125mg BID. She is euvolemic on exam.   HLD LDL 108, TG 150, HDL 51, total chol 189. She has not tolerate statins in the past. Continue Zetia 10mg  daily.    Dispo: Follow-up in 2 months  Signed, Perel Hauschild David Stall, PA-C

## 2023-03-01 ENCOUNTER — Telehealth: Payer: Self-pay | Admitting: Internal Medicine

## 2023-03-01 NOTE — Telephone Encounter (Signed)
Pt was rescheduled for first available hospital follow up on 03/14/2023.

## 2023-03-01 NOTE — Telephone Encounter (Signed)
Patient was called on 03/01/2023 to change 03/02/2023 appointment to a virtual. Patient stated she did not want to do a virtual. Patient was rescheduled to 03/14/2023, at time of call that was the only appointment available.The appointment is for a hospital follow up.

## 2023-03-01 NOTE — Telephone Encounter (Signed)
Noted

## 2023-03-02 ENCOUNTER — Encounter: Payer: Self-pay | Admitting: Medical

## 2023-03-02 ENCOUNTER — Ambulatory Visit: Payer: Medicare Other | Attending: Medical | Admitting: Medical

## 2023-03-02 ENCOUNTER — Telehealth: Payer: Self-pay

## 2023-03-02 ENCOUNTER — Inpatient Hospital Stay: Payer: Medicare Other | Admitting: Internal Medicine

## 2023-03-02 ENCOUNTER — Other Ambulatory Visit: Payer: Self-pay

## 2023-03-02 ENCOUNTER — Encounter: Payer: Self-pay | Admitting: Oncology

## 2023-03-02 VITALS — BP 115/72 | HR 78 | Ht 66.0 in | Wt 186.2 lb

## 2023-03-02 DIAGNOSIS — I255 Ischemic cardiomyopathy: Secondary | ICD-10-CM

## 2023-03-02 DIAGNOSIS — I2510 Atherosclerotic heart disease of native coronary artery without angina pectoris: Secondary | ICD-10-CM

## 2023-03-02 DIAGNOSIS — K922 Gastrointestinal hemorrhage, unspecified: Secondary | ICD-10-CM | POA: Diagnosis not present

## 2023-03-02 DIAGNOSIS — I5022 Chronic systolic (congestive) heart failure: Secondary | ICD-10-CM

## 2023-03-02 DIAGNOSIS — E782 Mixed hyperlipidemia: Secondary | ICD-10-CM

## 2023-03-02 MED ORDER — CLOPIDOGREL BISULFATE 75 MG PO TABS
300.0000 mg | ORAL_TABLET | Freq: Every day | ORAL | 0 refills | Status: DC
  Filled 2023-03-02: qty 4, 1d supply, fill #0

## 2023-03-02 MED ORDER — BENZONATATE 200 MG PO CAPS
200.0000 mg | ORAL_CAPSULE | Freq: Two times a day (BID) | ORAL | 0 refills | Status: AC
  Filled 2023-03-02: qty 20, 10d supply, fill #0

## 2023-03-02 MED ORDER — CLOPIDOGREL BISULFATE 75 MG PO TABS
75.0000 mg | ORAL_TABLET | Freq: Every day | ORAL | 3 refills | Status: DC
  Filled 2023-03-02: qty 90, 90d supply, fill #0
  Filled 2023-05-22: qty 90, 90d supply, fill #1
  Filled 2023-08-23: qty 90, 90d supply, fill #2

## 2023-03-02 NOTE — Patient Instructions (Signed)
Medication Instructions:  Your physician recommends the following medication changes.  STOP TAKING: Brilinta   START TAKING: Plavix 75 mg by mouth daily Tessalon 200 mg by mouth twice a day for 10 days   *If you need a refill on your cardiac medications before your next appointment, please call your pharmacy*   Lab Work: No labs ordered today    Testing/Procedures: No test ordered today    Follow-Up: At [facility], you and your health needs are our priority.  As part of our continuing mission to provide you with exceptional heart care, we have created designated Provider Care Teams.  These Care Teams include your primary Cardiologist (physician) and Advanced Practice Providers (APPs -  Physician Assistants and Nurse Practitioners) who all work together to provide you with the care you need, when you need it.  We recommend signing up for the patient portal called "MyChart".  Sign up information is provided on this After Visit Summary.  MyChart is used to connect with patients for Virtual Visits (Telemedicine).  Patients are able to view lab/test results, encounter notes, upcoming appointments, etc.  Non-urgent messages can be sent to your provider as well.   To learn more about what you can do with MyChart, go to [email or web address].    Your next appointment:   May 08, 2022 @ 1:30 pm   Provider:   Terrilee Croak, PA-C

## 2023-03-02 NOTE — Telephone Encounter (Signed)
Patient has been called and scheduled for 03/06/23 @ 9:30 per Dr Darrick Huntsman.

## 2023-03-02 NOTE — Telephone Encounter (Signed)
Per Dr. Kirke Corin, the patient will require a loading dose of 300 mg of Plavix for one dose, followed by 75 mg daily thereafter. The patient has been informed and has verbalized understanding. An updated prescription has been sent to the pharmacy.

## 2023-03-02 NOTE — Telephone Encounter (Signed)
noted

## 2023-03-05 ENCOUNTER — Other Ambulatory Visit (INDEPENDENT_AMBULATORY_CARE_PROVIDER_SITE_OTHER): Payer: Medicare Other

## 2023-03-05 ENCOUNTER — Other Ambulatory Visit: Payer: Self-pay

## 2023-03-05 DIAGNOSIS — K922 Gastrointestinal hemorrhage, unspecified: Secondary | ICD-10-CM

## 2023-03-05 LAB — BASIC METABOLIC PANEL
BUN: 13 mg/dL (ref 6–23)
CO2: 26 meq/L (ref 19–32)
Calcium: 9 mg/dL (ref 8.4–10.5)
Chloride: 104 meq/L (ref 96–112)
Creatinine, Ser: 0.81 mg/dL (ref 0.40–1.20)
GFR: 69.51 mL/min (ref >60.00)
Glucose, Bld: 123 mg/dL — ABNORMAL HIGH (ref 70–99)
Potassium: 4.4 meq/L (ref 3.5–5.1)
Sodium: 139 meq/L (ref 135–145)

## 2023-03-05 LAB — CBC WITH DIFFERENTIAL/PLATELET
Basophils Absolute: 0.1 10*3/uL (ref 0.0–0.1)
Basophils Relative: 0.7 % (ref 0.0–3.0)
Eosinophils Absolute: 0.3 10*3/uL (ref 0.0–0.7)
Eosinophils Relative: 3.6 % (ref 0.0–5.0)
HCT: 39.9 % (ref 36.0–46.0)
Hemoglobin: 13.3 g/dL (ref 12.0–15.0)
Lymphocytes Relative: 35.4 % (ref 12.0–46.0)
Lymphs Abs: 2.5 10*3/uL (ref 0.7–4.0)
MCHC: 33.4 g/dL (ref 30.0–36.0)
MCV: 89.5 fL (ref 78.0–100.0)
Monocytes Absolute: 0.4 10*3/uL (ref 0.1–1.0)
Monocytes Relative: 6.2 % (ref 3.0–12.0)
Neutro Abs: 3.9 10*3/uL (ref 1.4–7.7)
Neutrophils Relative %: 54.1 % (ref 43.0–77.0)
Platelets: 315 10*3/uL (ref 150.0–400.0)
RBC: 4.45 Mil/uL (ref 3.87–5.11)
RDW: 16.2 % — ABNORMAL HIGH (ref 11.5–15.5)
WBC: 7.2 10*3/uL (ref 4.0–10.5)

## 2023-03-05 MED ORDER — GABAPENTIN 100 MG PO CAPS
100.0000 mg | ORAL_CAPSULE | Freq: Three times a day (TID) | ORAL | 1 refills | Status: DC
Start: 2023-03-05 — End: 2023-04-09
  Filled 2023-03-05: qty 90, 18d supply, fill #0
  Filled 2023-03-22: qty 90, 18d supply, fill #1

## 2023-03-06 ENCOUNTER — Ambulatory Visit: Payer: Medicare Other | Admitting: Internal Medicine

## 2023-03-06 ENCOUNTER — Other Ambulatory Visit: Payer: Self-pay

## 2023-03-06 ENCOUNTER — Encounter: Payer: Self-pay | Admitting: Internal Medicine

## 2023-03-06 ENCOUNTER — Telehealth: Payer: Self-pay

## 2023-03-06 VITALS — BP 122/76 | HR 83 | Ht 66.0 in | Wt 187.6 lb

## 2023-03-06 DIAGNOSIS — I5022 Chronic systolic (congestive) heart failure: Secondary | ICD-10-CM | POA: Diagnosis not present

## 2023-03-06 DIAGNOSIS — K5721 Diverticulitis of large intestine with perforation and abscess with bleeding: Secondary | ICD-10-CM | POA: Diagnosis not present

## 2023-03-06 DIAGNOSIS — I2510 Atherosclerotic heart disease of native coronary artery without angina pectoris: Secondary | ICD-10-CM

## 2023-03-06 DIAGNOSIS — Z09 Encounter for follow-up examination after completed treatment for conditions other than malignant neoplasm: Secondary | ICD-10-CM

## 2023-03-06 MED ORDER — HYDROCODONE-ACETAMINOPHEN 5-325 MG PO TABS
1.0000 | ORAL_TABLET | Freq: Two times a day (BID) | ORAL | 0 refills | Status: DC | PRN
  Filled 2023-03-06: qty 14, 7d supply, fill #0

## 2023-03-06 MED ORDER — METHOCARBAMOL 500 MG PO TABS
250.0000 mg | ORAL_TABLET | Freq: Two times a day (BID) | ORAL | 5 refills | Status: DC | PRN
Start: 2023-03-06 — End: 2023-09-18
  Filled 2023-03-06: qty 30, 15d supply, fill #0

## 2023-03-06 NOTE — Assessment & Plan Note (Signed)
 Patient has 3 recent stents placement, 2 on 11/4 and 1 on 11/2522 by cardiology and was started on aspirin and Brilinta. Brilinta was ultimately stopped due to life threatening GI bleed in Jan 2025.  Tough situation as she will be high risk for restenosis. Continue Plavix

## 2023-03-06 NOTE — Assessment & Plan Note (Signed)
 Advised to continue sodium restriction.

## 2023-03-06 NOTE — Telephone Encounter (Signed)
 Copied from CRM [id number]. Topic: Clinical - Request for Lab/Test Order >> Mar 06, 2023 11:08 AM [place] J wrote: Reason for CRM: Patient just left clinic and can't remember if Dr. Darrick Huntsman needed her to do labs. Patient would like a call back with updated information on this so she can make sure an appointment is set up in the future.

## 2023-03-06 NOTE — Assessment & Plan Note (Signed)
 She has lost 20 lb s  since November,

## 2023-03-06 NOTE — Assessment & Plan Note (Signed)
Patient is stable post discharge and has no new issues or questions about discharge plans at the visit today for hospital follow up.  I have reviewed the records from the hospital admission in detail with patient today. 

## 2023-03-06 NOTE — Progress Notes (Signed)
 Subjective:  Patient ID: [name], female    DOB: [birth year]  Age: 78 y.o. MRN: [id number]  CC: The primary encounter diagnosis was Chronic HFrEF (heart failure with reduced ejection fraction) (HCC). Diagnoses of Diverticulitis of large intestine with perforation with bleeding, Hospital discharge follow-up, Morbid obesity (HCC), and Coronary artery disease involving native coronary artery of native heart, unspecified whether angina present were also pertinent to this visit.   HPI [name] presents for  Chief Complaint  Patient presents with   Hospitalization Follow-up   Nahomy was admitted admitted  to [facility] on January 25 with hypovolemic shock secondary to an acute  lower  GI bleed resulting in syncope at home.  Symptoms occurred suddenly ;  she passed to large maroon stools and fainted  while on the commode.  No precedent flare of diverticulitis,  had received an ESI from [facility] on Jan 22 .    At time of admission  Hemoglobin was 11.7 decreased from 14.3 two months prior.  Brilinta was held. ASA was discontinued. CT angio of the abdomen showed acute inflammation involving the right colon near hepatic flexure, most compatible with acute diverticulitis.  There was no evidence of active GI bleed on the first scan and  Patient was started on Zosyn.  However,  her Hemoglobin dropped to 8.4 and patient was transfused 1 unit of PRBCs.  The next day she continued to bleed and hemoglobin dropped to  7.9 and she was transferred to ICU for emergent  blood transfusion and fluid resuscitation with 1 L bolus for hypovolemic shock,   RBC tagged scan identifed  active bleeding just below the hepatic flexure.  IR performed embolization.  Emergency protocol blood transfusion and FFP were ordered ; patient ultimately received 2 more units of blood transfused. She had persistent bleeding and more blood was transfused.  For a total of 6 units before hgb stabilized .  she was discharged home on Jan 31 with hgb of  11.3    CAD:  she was seen by Cardiology on Feb 14 and monotherapy with Brilinta was stopped.  Has been taking  Plavix as of Saturday    Acute GI bleed:  hgb has normalized by repeat labs done Feb 17 ; however  she endorses exertional fatigue .  She has deferred  PT . Appetite is improving; she has advanced her diet to soft diet: sweet potatoes,  cooked vegetables,  has been avoiding tomatoes,  spicy food., no salt.  She is generally miserable due to dietary restrctions self imposed because of history of diverticulitis.  Stools since discharge have been hard at times and she has had to strain despite taking  colace bid;  she substitutes miralax a few times per week.  Has not seen any blood in stools    Lab Results  Component Value Date   WBC 7.2 03/05/2023   HGB 13.3 03/05/2023   HCT 39.9 03/05/2023   MCV 89.5 03/05/2023   PLT 315.0 03/05/2023   Left leg pain:  MRI lumbar spine ordered by [facility] clinic was reviewed and noted L3-L4: Mild disc bulging with superimposed large left foraminal disc protrusion with impingement of the exiting left L3 nerve root. Mild bilateral facet arthropathy. Mild-to-moderate left neuraforaminal stenosis. No spinal canal or right neuroforaminal stenosis. L4-L5: Mild disc bulging encroaching on the exited bilateral L4 nerve roots without displacement. Mild bilateral facet arthropathy. Mild right lateral recess stenosis. Mild left-greater-than-right neuroforaminal stenosis. No spinal canal stenosis.Taking gabapentin 200 mg  tid ,  not controlling her  LEFT LEG PAIN that does not radiate below the knee .  Sees Whitney Meeler this afternoon      , Lab Results  Component Value Date   HGBA1C 6.2 (H) 02/09/2023      Outpatient Medications Prior to Visit  Medication Sig Dispense Refill   albuterol (VENTOLIN HFA) 108 (90 Base) MCG/ACT inhaler Inhale 2 puffs into the lungs every 6 (six) hours as needed for wheezing or shortness of breath.     ALPRAZolam (XANAX) 0.25  MG tablet Take 1 tablet (0.25 mg total) by mouth 2 (two) times daily as needed for anxiety. 30 tablet 0   benzonatate (TESSALON) 200 MG capsule Take 1 capsule (200 mg total) by mouth 2 (two) times daily for 10 days. 20 capsule 0   bimatoprost (LUMIGAN) 0.01 % SOLN Place 1 drop into both eyes at bedtime. 2.5 mL 5   budesonide (PULMICORT) 180 MCG/ACT inhaler Inhale 2 puffs into the lungs 2 (two) times daily.     carvedilol (COREG) 3.125 MG tablet Take 1 tablet (3.125 mg total) by mouth 2 (two) times daily with a meal. 180 tablet 3   cetirizine (ZYRTEC) 10 MG tablet Take 10 mg by mouth daily.     clopidogrel (PLAVIX) 75 MG tablet Take 1 tablet (75 mg total) by mouth daily. 90 tablet 3   docusate sodium (COLACE) 100 MG capsule Take 1 capsule (100 mg total) by mouth 2 (two) times daily. 10 capsule 0   EPINEPHrine 0.3 mg/0.3 mL IJ SOAJ injection as needed.     gabapentin (NEURONTIN) 100 MG capsule Take 1 capsule (100 mg total) by mouth 3 (three) times daily. If no improvement after 4 or 5 days increase to 2 capsules (600mg ) 3 times a day 90 capsule 1   MILK THISTLE PO Take 1 capsule by mouth daily.      nitroGLYCERIN (NITROSTAT) 0.4 MG SL tablet Place 1 tablet (0.4 mg total) under the tongue every 5 (five) minutes for up to 3 doses as needed for chest pain. 25 tablet 0   polyethylene glycol (MIRALAX / GLYCOLAX) 17 g packet Take 17 g by mouth daily as needed for mild constipation. 14 each 0   Probiotic Product (PROBIOTIC COMPLEX ACIDOPHILUS PO) Take 1 capsule by mouth daily.     sodium chloride (OCEAN) 0.65 % SOLN nasal spray Place 1 spray into the nose as needed.     clopidogrel (PLAVIX) 75 MG tablet Take 4 tablets (300 mg total) by mouth daily. Then take 75 mg daily thereafter 4 tablet 0   No facility-administered medications prior to visit.    Review of Systems;  Patient denies headache, fevers, malaise, unintentional weight loss, skin rash, eye pain, sinus congestion and sinus pain, sore throat,  dysphagia,  hemoptysis , cough, dyspnea, wheezing, chest pain, palpitations, orthopnea, edema, abdominal pain, nausea, melena, diarrhea, constipation, flank pain, dysuria, hematuria, urinary  Frequency, nocturia, numbness, tingling, seizures,  Focal weakness, Loss of consciousness,  Tremor, insomnia, depression, anxiety, and suicidal ideation.      Objective:  BP 122/76   Pulse 83   Ht 5\' 6"  (1.676 m)   Wt 187 lb 9.6 oz (85.1 kg)   SpO2 98%   BMI 30.28 kg/m   BP Readings from Last 3 Encounters:  03/06/23 122/76  03/02/23 115/72  02/16/23 134/65    Wt Readings from Last 3 Encounters:  03/06/23 187 lb 9.6 oz (85.1 kg)  03/02/23 186 lb 3.2 oz (84.5  kg)  02/11/23 204 lb 2.3 oz (92.6 kg)    Physical Exam Vitals reviewed.  Constitutional:      General: She is not in acute distress.    Appearance: Normal appearance. She is normal weight. She is not ill-appearing, toxic-appearing or diaphoretic.  HENT:     Head: Normocephalic.  Eyes:     General: No scleral icterus.       Right eye: No discharge.        Left eye: No discharge.     Conjunctiva/sclera: Conjunctivae normal.  Cardiovascular:     Rate and Rhythm: Normal rate and regular rhythm.     Heart sounds: Normal heart sounds.  Pulmonary:     Effort: Pulmonary effort is normal. No respiratory distress.     Breath sounds: Normal breath sounds.  Musculoskeletal:        General: Normal range of motion.  Skin:    General: Skin is warm and dry.  Neurological:     General: No focal deficit present.     Mental Status: She is alert and oriented to person, place, and time. Mental status is at baseline.  Psychiatric:        Mood and Affect: Mood normal.        Behavior: Behavior normal.        Thought Content: Thought content normal.        Judgment: Judgment normal.    Lab Results  Component Value Date   HGBA1C 6.2 (H) 02/09/2023   HGBA1C 7.1 (H) 11/13/2022   HGBA1C 7.2 (H) 06/15/2022    Lab Results  Component Value  Date   CREATININE 0.81 03/05/2023   CREATININE 0.80 02/16/2023   CREATININE 0.70 02/15/2023    Lab Results  Component Value Date   WBC 7.2 03/05/2023   HGB 13.3 03/05/2023   HCT 39.9 03/05/2023   PLT 315.0 03/05/2023   GLUCOSE 123 (H) 03/05/2023   CHOL 189 11/13/2022   TRIG 150.0 (H) 11/13/2022   HDL 51.40 11/13/2022   LDLDIRECT 127.0 11/13/2022   LDLCALC 108 (H) 11/13/2022   ALT 17 12/21/2022   AST 19 12/21/2022   NA 139 03/05/2023   K 4.4 03/05/2023   CL 104 03/05/2023   CREATININE 0.81 03/05/2023   BUN 13 03/05/2023   CO2 26 03/05/2023   TSH 3.43 11/13/2022   INR 1.1 11/17/2022   HGBA1C 6.2 (H) 02/09/2023   MICROALBUR <0.7 06/15/2022    MR LUMBAR SPINE WO CONTRAST Result Date: 02/28/2023 CLINICAL DATA:  Low back pain radiating to the left hip groin, and thigh to the knee since mid November. No injury or prior surgery. EXAM: MRI LUMBAR SPINE WITHOUT CONTRAST TECHNIQUE: Multiplanar, multisequence MR imaging of the lumbar spine was performed. No intravenous contrast was administered. COMPARISON:  CT abdomen pelvis dated February 08, 2023. FINDINGS: Segmentation:  Standard. Alignment:  Physiologic. Vertebrae:  No fracture, evidence of discitis, or bone lesion. Conus medullaris and cauda equina: Conus extends to the L1-L2 level. Conus and cauda equina appear normal. Paraspinal and other soft tissues: Negative. Disc levels: T12-L1: Minimal leftward disc bulging and endplate spurring. No stenosis. L1-L2:  Mild disc bulging.  No stenosis. L2-L3:  Negative. L3-L4: Mild disc bulging with superimposed large left foraminal disc protrusion with impingement of the exiting left L3 nerve root. Mild bilateral facet arthropathy. Mild-to-moderate left neuroforaminal stenosis. No spinal canal or right neuroforaminal stenosis. L4-L5: Mild disc bulging encroaching on the exited bilateral L4 nerve roots without displacement. Mild bilateral facet arthropathy.  Mild right lateral recess stenosis. Mild  left-greater-than-right neuroforaminal stenosis. No spinal canal stenosis. L5-S1: Negative disc. Moderate bilateral facet arthropathy. No stenosis. IMPRESSION: 1. Large left foraminal disc protrusion at L3-L4 with impingement of the exiting left L3 nerve root. Electronically Signed   By: Obie Dredge M.D.   On: 02/28/2023 19:16    Assessment & Plan:  .Chronic HFrEF (heart failure with reduced ejection fraction) (HCC) Assessment & Plan: Advised to continue sodium restriction.    Diverticulitis of large intestine with perforation with bleeding Assessment & Plan: With diverticular bleed resulting in hypovolemic shock secondary to severe blood loss requiring multiple transfusions and IR embolization .  Her anemia has resolved.  Anticoagulation has been modified to plavix only , which was changed o n  Saturday Feb 15.   She is tolerating prudent diet.  We reviewed her stool history and I recommended adding Benefiber once daily to bid colace   .    Hospital discharge follow-up Assessment & Plan: Patient is stable post discharge and has no new issues or questions about discharge plans at the visit today for hospital follow up.  I have reviewed the records from the hospital admission in detail with patient today.    Morbid obesity (HCC) Assessment & Plan: She has lost 20 lb s  since November,   Coronary artery disease involving native coronary artery of native heart, unspecified whether angina present Assessment & Plan: Patient has 3 recent stents placement, 2 on 11/4 and 1 on 11/2522 by cardiology and was started on aspirin and Brilinta. Brilinta was ultimately stopped due to life threatening GI bleed in Jan 2025.  Tough situation as she will be high risk for restenosis. Continue Plavix       I provided 30 minutes of face-to-face time during this encounter reviewing patient's  recent hospitalization , most recent visit with cardiology,    recent surgical and non surgical procedures,  previous  labs and imaging studies, counseling on management of diverticulosis  review of assessment and plan. and post visit ordering to diagnostics and therapeutics .   Follow-up: Return in about 3 months (around 06/03/2023) for follow up diabetes.   Sherlene Shams, MD

## 2023-03-06 NOTE — Patient Instructions (Addendum)
 You can resume a regular diet.  Just avoid poppy seeds,  avoid processed foods that contain salt  Ok to resume spicy foods. Just watch the salt.  Tomat salsa is  ok   I recommend adding Benefiber  once daily and continue the colace  as you are doing.

## 2023-03-06 NOTE — Telephone Encounter (Signed)
 LMTCB. Pt does not need labs at this time

## 2023-03-06 NOTE — Assessment & Plan Note (Addendum)
 With diverticular bleed resulting in hypovolemic shock secondary to severe blood loss requiring multiple transfusions and IR embolization .  Her anemia has resolved.  Anticoagulation has been modified to plavix only , which was changed o n  Saturday Feb 15.   She is tolerating prudent diet.  We reviewed her stool history and I recommended adding Benefiber once daily to bid colace   .

## 2023-03-07 ENCOUNTER — Ambulatory Visit: Payer: Medicare Other | Admitting: Internal Medicine

## 2023-03-14 ENCOUNTER — Inpatient Hospital Stay: Payer: Medicare Other | Admitting: Internal Medicine

## 2023-03-22 ENCOUNTER — Ambulatory Visit: Payer: Medicare Other

## 2023-03-27 ENCOUNTER — Other Ambulatory Visit: Payer: Self-pay

## 2023-03-27 MED ORDER — HYDROCODONE-ACETAMINOPHEN 5-325 MG PO TABS
1.0000 | ORAL_TABLET | Freq: Two times a day (BID) | ORAL | 0 refills | Status: DC | PRN
Start: 2023-03-27 — End: 2023-07-25
  Filled 2023-03-27: qty 14, 7d supply, fill #0

## 2023-03-29 ENCOUNTER — Telehealth: Payer: Self-pay | Admitting: Family

## 2023-03-29 NOTE — Progress Notes (Unsigned)
 Advanced Heart Failure Clinic Note    PCP: Sherlene Shams, MD (last seen 11/24) Cardiologist: Terrilee Croak, NP (last seen 12/24)  HPI:  [name] is a 78 y/o female with a history of NSTEMI (11/24), plaque psoriasis, OSA on CPAP, DM, asthma, obesity, HTN, anxiety and chronic heart failure. Had staged PCI 12/11/22.  Admitted 11/17/22 due to 3-day history of recurrent substernal chest pain radiating to jaw, both shoulders and left arm, improved with deep breathing. Troponin peaked at 1568. ST depression in lateral leads. CTA chest no PE, mild cardiac enlargement with prominent coronary artery calcifications. No STEMI. Echo EF 35-40%, (+)RWMA. Cath w/ complex PCI. Meds adjusted and troponin trended down to 1483. Hx intolerance to atorvastatin w/ myalgias, will trial rosuvastatin - consider PCSK9i outpatient.   Echo 11/18/22: EF 35-40% with mild LVH, mild MR/ AS  LHC 11/20/22:    Prox RCA lesion is 30% stenosed.   Dist RCA lesion is 70% stenosed with 90% stenosed side branch in RPDA.   Prox LAD to Mid LAD lesion is 30% stenosed.   1st Diag lesion is 50% stenosed.   Mid LAD lesion is 40% stenosed.   Mid LAD to Dist LAD lesion is 30% stenosed.   Ost LM lesion is 30% stenosed.   1st Mrg lesion is 99% stenosed.   Prox Cx to Mid Cx lesion is 90% stenosed.   A drug-eluting stent was successfully placed using a STENT ONYX FRONTIER 2.5X26.   A drug-eluting stent was successfully placed using a STENT ONYX FRONTIER 2.5X22.   Post intervention, there is a 0% residual stenosis.   Post intervention, there is a 0% residual stenosis.  1.  Diffuse and moderately calcified coronary arteries with severe two-vessel coronary artery disease.  The culprit for myocardial infarction seems to be plaque rupture and OM1 and mid left circumflex.  In addition, there is severe stenosis in the distal RCA extending into the ostium of the right PDA.  The LAD has moderate disease in multiple areas. 2.  Left ventricular  angiography was not performed.  EF was moderately reduced by echo.  LVEDP was mildly elevated. 3.  Successful complex bifurcation angioplasty and kissing stent placement to OM1/mid left circumflex using 2 drug-eluting stents.  Recommendations: Dual antiplatelet therapy for at least 12 months and preferably longer. The patient is intolerant to statins but will try small dose rosuvastatin 5 mg daily.  Staged PCI 12/11/22: 1.  Widely patent left circumflex stent with no significant restenosis. Severe distal RCA stenosis extending into the right PDA. Moderate ostial RCA stenosis with pressure dampening with guide catheter engagement. However, there was no pressure dampening with diagnostic catheter engagement during most recent angiography. 2. Left ventricular angiography was not performed. Mildly elevated left ventricular end-diastolic pressure at 18 mmHg.3. Successful angioplasty and drug-eluting stent placement to the distal right coronary artery extending into the right PDA.   She presents today for a HF follow-up visit with a chief complaint of minimal SOB with moderate exertion. She does notice that her SOB is worse after every dose of brilinta but resolves a few hours afterwards. She has occasional dizziness and fatigue along with this. Denies chest pain, cough, palpitations, abdominal distention, pedal edema or weight gain. Her biggest complaint today is of her left thigh pain which she feels came from trying another statin. She is now on zetia but continues to have leg pain. Is using aspercreme with lidocaine on her left thigh from her hip down to her  knee. Sometimes she has to get up and move around because the pain is occasionally interfering with her sleep.   She is not adding salt to her food but is asking if she can eat out on rare occasions. She misses eating spaghetti or tacos.   ROS: All systems negative except as listed in HPI, PMH and Problem List.  SH:  Social History    Socioeconomic History   Marital status: Married    Spouse name: Not on file   Number of children: 2   Years of education: Not on file   Highest education level: Some college, no degree  Occupational History   Occupation: RETIRED  Tobacco Use   Smoking status: Former    Current packs/day: 0.00    Types: Cigarettes    Quit date: 01/16/1978    Years since quitting: 45.2   Smokeless tobacco: Never   Tobacco comments:    REMOTELY QUIT IN 1987 AFTER A FEW YEARS OF USE  Vaping Use   Vaping status: Never Used  Substance and Sexual Activity   Alcohol use: No   Drug use: No   Sexual activity: Not on file  Other Topics Concern   Not on file  Social History Narrative   Lives with husband.   Social Drivers of Corporate investment banker Strain: Low Risk  (02/01/2023)   Received from [facility]   Overall Financial Resource Strain (CARDIA)    Difficulty of Paying Living Expenses: Not hard at all  Food Insecurity: No Food Insecurity (02/19/2023)   Hunger Vital Sign    Worried About Running Out of Food in the Last Year: Never true    Ran Out of Food in the Last Year: Never true  Transportation Needs: No Transportation Needs (02/19/2023)   PRAPARE - Administrator, Civil Service (Medical): No    Lack of Transportation (Non-Medical): No  Recent Concern: Transportation Needs - Unmet Transportation Needs (11/21/2022)   PRAPARE - Transportation    Lack of Transportation (Medical): Yes    Lack of Transportation (Non-Medical): Yes  Physical Activity: Unknown (07/30/2018)   Exercise Vital Sign    Days of Exercise per Week: 0 days    Minutes of Exercise per Session: Not on file  Stress: No Stress Concern Present (07/30/2018)   [employer] of Occupational Health - Occupational Stress Questionnaire    Feeling of Stress : Not at all  Social Connections: Socially Integrated (02/19/2023)   Social Connection and Isolation Panel [NHANES]    Frequency of Communication  with Friends and Family: More than three times a week    Frequency of Social Gatherings with Friends and Family: More than three times a week    Attends Religious Services: More than 4 times per year    Active Member of [employer] or Organizations: Yes    Attends Engineer, structural: More than 4 times per year    Marital Status: Married  Catering manager Violence: Not At Risk (02/19/2023)   Humiliation, Afraid, Rape, and Kick questionnaire    Fear of Current or Ex-Partner: No    Emotionally Abused: No    Physically Abused: No    Sexually Abused: No    FH:  Family History  Problem Relation Age of Onset   Mental illness Mother        DEMENTIA   Heart disease Father 42       MI.Marland KitchenMarland KitchenS/D AVR/CABG x3   Cancer Neg Hx  no ovarian colon breast   Breast cancer Neg Hx     Past Medical History:  Diagnosis Date   Asthma    Asthma due to environmental allergies    grass, mold, trees, dust   Cataract    Cellulitis    LEFT LEG   Diabetes mellitus    CONTROLLED ON DIET ALONE   Edema leg    LEFT LEG...CHRONIC   Endometrial hyperplasia    External hemorrhoid    Fatty liver    Glaucoma    Hepatic cyst    STABLE PER 05/20/2008 ULTRASOUND   Hypertension    borderline...controlled since taking herself of HCTZ IN JULY   IBS (irritable bowel syndrome)    Murmur, cardiac    Neuropathy    OSA on CPAP    Osteoarthritis    Psoriasis    Sleep apnea    CPAP    Current Outpatient Medications  Medication Sig Dispense Refill   albuterol (VENTOLIN HFA) 108 (90 Base) MCG/ACT inhaler Inhale 2 puffs into the lungs every 6 (six) hours as needed for wheezing or shortness of breath.     ALPRAZolam (XANAX) 0.25 MG tablet Take 1 tablet (0.25 mg total) by mouth 2 (two) times daily as needed for anxiety. 30 tablet 0   bimatoprost (LUMIGAN) 0.01 % SOLN Place 1 drop into both eyes at bedtime. 2.5 mL 5   budesonide (PULMICORT) 180 MCG/ACT inhaler Inhale 2 puffs into the lungs 2 (two) times daily.      carvedilol (COREG) 3.125 MG tablet Take 1 tablet (3.125 mg total) by mouth 2 (two) times daily with a meal. 180 tablet 3   cetirizine (ZYRTEC) 10 MG tablet Take 10 mg by mouth daily.     clopidogrel (PLAVIX) 75 MG tablet Take 1 tablet (75 mg total) by mouth daily. 90 tablet 3   docusate sodium (COLACE) 100 MG capsule Take 1 capsule (100 mg total) by mouth 2 (two) times daily. 10 capsule 0   EPINEPHrine 0.3 mg/0.3 mL IJ SOAJ injection as needed.     gabapentin (NEURONTIN) 100 MG capsule Take 1 capsule (100 mg total) by mouth 3 (three) times daily. If no improvement after 4 or 5 days increase to 2 capsules (600mg ) 3 times a day 90 capsule 1   HYDROcodone-acetaminophen (NORCO/VICODIN) 5-325 MG tablet Take 1 tablet by mouth 2 (two) times daily as needed. 14 tablet 0   methocarbamol (ROBAXIN) 500 MG tablet Take 0.5-1 tablets (250-500 mg total) by mouth 2 (two) times daily as needed for muscle spasms 30 tablet 5   MILK THISTLE PO Take 1 capsule by mouth daily.      nitroGLYCERIN (NITROSTAT) 0.4 MG SL tablet Place 1 tablet (0.4 mg total) under the tongue every 5 (five) minutes for up to 3 doses as needed for chest pain. 25 tablet 0   polyethylene glycol (MIRALAX / GLYCOLAX) 17 g packet Take 17 g by mouth daily as needed for mild constipation. 14 each 0   Probiotic Product (PROBIOTIC COMPLEX ACIDOPHILUS PO) Take 1 capsule by mouth daily.     sodium chloride (OCEAN) 0.65 % SOLN nasal spray Place 1 spray into the nose as needed.     No current facility-administered medications for this visit.   There were no vitals filed for this visit.  Wt Readings from Last 3 Encounters:  03/06/23 187 lb 9.6 oz (85.1 kg)  03/02/23 186 lb 3.2 oz (84.5 kg)  02/11/23 204 lb 2.3 oz (92.6 kg)   Lab  Results  Component Value Date   CREATININE 0.81 03/05/2023   CREATININE 0.80 02/16/2023   CREATININE 0.70 02/15/2023    PHYSICAL EXAM:  General:  Well appearing. No resp difficulty HEENT: normal Neck: supple. JVP  flat. No lymphadenopathy or thryomegaly appreciated. Cor: PMI normal. Regular rate & rhythm. No rubs, gallops or murmurs. Lungs: clear Abdomen: soft, nontender, nondistended. No hepatosplenomegaly. No bruits or masses.  Extremities: no cyanosis, clubbing, rash, edema Neuro: alert & oriented x3, cranial nerves grossly intact. Moves all 4 extremities w/o difficulty. Affect pleasant.   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- - 2 stents placed 11/20/22 with RCA PCI 12/11/22 - NYHA class II - euvolemic - weighing daily and home weight has declined; reviewed to call for an overnight weight gain of > 2 pounds or a weekly weight gain of > 5 pounds - weight down 5 pounds from last visit here 1 month ago - Echo 11/18/22: EF 35-40% with mild LVH, mild MR/ AS - continue carvedilol 3.125mg  BID - continue losartan 12.5mg  daily - current BP will not allow for titration or other GDMT therapy at this time - not adding salt to her food and she and her husband are reading food labels for sodium content - can occasionally have chicken tacos or spaghetti but being extra cautious about sodium intake surrounding that meal knowing she could get her daily sodium intake at an outside meal - proNP 07/27/07 was 38.5  2: HTN- - BP 114/64 - saw PCP Darrick Huntsman) 11/24 - BMP 12/21/22 showed sodium 139, potassium 4.8, creatinine 0.95 & GFR 57.49  3: CAD- - continue zetia 10mg  daily - unable to tolerate crestor due to leg pain - continue brilinta 90mg  BID; does get worsening SOB after taking brilinta which resolves a few hours later - saw cardiology Fransico Michael) 12/24 - had staged PCI 12/11/22 - LDL 11/13/22 was 108 - Lipo (a) 11/18/22 was 145.8 - cardiac rehab to start early 2025 - LHC 11/20/22:    Prox RCA lesion is 30% stenosed.   Dist RCA lesion is 70% stenosed with 90% stenosed side branch in RPDA.   Prox LAD to Mid LAD lesion is 30% stenosed.   1st Diag lesion  is 50% stenosed.   Mid LAD lesion is 40% stenosed.   Mid LAD to Dist LAD lesion is 30% stenosed.   Ost LM lesion is 30% stenosed.   1st Mrg lesion is 99% stenosed.   Prox Cx to Mid Cx lesion is 90% stenosed.   A drug-eluting stent was successfully placed using a STENT ONYX FRONTIER 2.5X26.   A drug-eluting stent was successfully placed using a STENT ONYX FRONTIER 2.5X22.   Post intervention, there is a 0% residual stenosis.   Post intervention, there is a 0% residual stenosis.  1.  Diffuse and moderately calcified coronary arteries with severe two-vessel coronary artery disease.  The culprit for myocardial infarction seems to be plaque rupture and OM1 and mid left circumflex.  In addition, there is severe stenosis in the distal RCA extending into the ostium of the right PDA.  The LAD has moderate disease in multiple areas. 2.  Left ventricular angiography was not performed.  EF was moderately reduced by echo.  LVEDP was mildly elevated. 3.  Successful complex bifurcation angioplasty and kissing stent placement to OM1/mid left circumflex using 2 drug-eluting stents.  Recommendations: Dual antiplatelet therapy for at least 12 months and preferably longer.  4: OSA- -  wearing CPAP but is trying to get the equipment replaced  5: DM- - home glucose around low 100's (109 at recent check) - A1c 11/13/22 was 7.1%   Return in 3 months, sooner if needed. If still doing well at that time and unable to titrate GDMT due to BP, will have her follow-up with cardiology only at that time.

## 2023-03-29 NOTE — Telephone Encounter (Signed)
 Pt confirmed appt on 03/30/23

## 2023-03-30 ENCOUNTER — Encounter: Payer: Self-pay | Admitting: Family

## 2023-03-30 ENCOUNTER — Ambulatory Visit: Payer: Medicare Other | Attending: Family | Admitting: Family

## 2023-03-30 VITALS — BP 110/64 | HR 66 | Wt 187.8 lb

## 2023-03-30 DIAGNOSIS — I255 Ischemic cardiomyopathy: Secondary | ICD-10-CM | POA: Insufficient documentation

## 2023-03-30 DIAGNOSIS — E1140 Type 2 diabetes mellitus with diabetic neuropathy, unspecified: Secondary | ICD-10-CM | POA: Insufficient documentation

## 2023-03-30 DIAGNOSIS — F419 Anxiety disorder, unspecified: Secondary | ICD-10-CM | POA: Insufficient documentation

## 2023-03-30 DIAGNOSIS — I10 Essential (primary) hypertension: Secondary | ICD-10-CM | POA: Diagnosis not present

## 2023-03-30 DIAGNOSIS — I2510 Atherosclerotic heart disease of native coronary artery without angina pectoris: Secondary | ICD-10-CM | POA: Insufficient documentation

## 2023-03-30 DIAGNOSIS — I110 Hypertensive heart disease with heart failure: Secondary | ICD-10-CM | POA: Diagnosis not present

## 2023-03-30 DIAGNOSIS — K5792 Diverticulitis of intestine, part unspecified, without perforation or abscess without bleeding: Secondary | ICD-10-CM | POA: Insufficient documentation

## 2023-03-30 DIAGNOSIS — I252 Old myocardial infarction: Secondary | ICD-10-CM | POA: Insufficient documentation

## 2023-03-30 DIAGNOSIS — Z87891 Personal history of nicotine dependence: Secondary | ICD-10-CM | POA: Insufficient documentation

## 2023-03-30 DIAGNOSIS — Z955 Presence of coronary angioplasty implant and graft: Secondary | ICD-10-CM | POA: Diagnosis not present

## 2023-03-30 DIAGNOSIS — Z7902 Long term (current) use of antithrombotics/antiplatelets: Secondary | ICD-10-CM | POA: Insufficient documentation

## 2023-03-30 DIAGNOSIS — G4733 Obstructive sleep apnea (adult) (pediatric): Secondary | ICD-10-CM | POA: Diagnosis not present

## 2023-03-30 DIAGNOSIS — J45909 Unspecified asthma, uncomplicated: Secondary | ICD-10-CM | POA: Diagnosis not present

## 2023-03-30 DIAGNOSIS — L400 Psoriasis vulgaris: Secondary | ICD-10-CM | POA: Insufficient documentation

## 2023-03-30 DIAGNOSIS — E119 Type 2 diabetes mellitus without complications: Secondary | ICD-10-CM | POA: Diagnosis not present

## 2023-03-30 DIAGNOSIS — Z79899 Other long term (current) drug therapy: Secondary | ICD-10-CM | POA: Insufficient documentation

## 2023-03-30 DIAGNOSIS — E669 Obesity, unspecified: Secondary | ICD-10-CM | POA: Insufficient documentation

## 2023-03-30 DIAGNOSIS — I5022 Chronic systolic (congestive) heart failure: Secondary | ICD-10-CM | POA: Diagnosis not present

## 2023-03-30 DIAGNOSIS — K5732 Diverticulitis of large intestine without perforation or abscess without bleeding: Secondary | ICD-10-CM

## 2023-03-30 DIAGNOSIS — R0602 Shortness of breath: Secondary | ICD-10-CM | POA: Diagnosis present

## 2023-03-30 NOTE — Patient Instructions (Signed)
 Call us in the future if you need [place] for anything,

## 2023-04-09 ENCOUNTER — Other Ambulatory Visit: Payer: Self-pay

## 2023-04-09 MED ORDER — GABAPENTIN 100 MG PO CAPS
100.0000 mg | ORAL_CAPSULE | Freq: Three times a day (TID) | ORAL | 1 refills | Status: DC
  Filled 2023-04-09: qty 90, 17d supply, fill #0
  Filled 2023-04-23: qty 90, 17d supply, fill #1

## 2023-04-27 ENCOUNTER — Other Ambulatory Visit: Payer: Self-pay

## 2023-04-27 MED ORDER — GABAPENTIN 300 MG PO CAPS
300.0000 mg | ORAL_CAPSULE | Freq: Two times a day (BID) | ORAL | 2 refills | Status: DC
  Filled 2023-04-27: qty 60, 30d supply, fill #0
  Filled 2023-05-22: qty 60, 30d supply, fill #1

## 2023-05-08 ENCOUNTER — Ambulatory Visit: Payer: Medicare Other | Admitting: Medical

## 2023-05-08 ENCOUNTER — Ambulatory Visit: Admitting: Medical

## 2023-05-10 ENCOUNTER — Other Ambulatory Visit: Payer: Self-pay

## 2023-05-10 MED ORDER — ALBUTEROL SULFATE HFA 108 (90 BASE) MCG/ACT IN AERS
1.0000 | INHALATION_SPRAY | RESPIRATORY_TRACT | 0 refills | Status: AC | PRN
  Filled 2023-05-10: qty 18, 30d supply, fill #0

## 2023-05-10 MED ORDER — QVAR REDIHALER 80 MCG/ACT IN AERB
2.0000 | INHALATION_SPRAY | Freq: Two times a day (BID) | RESPIRATORY_TRACT | 5 refills | Status: DC
Start: 2023-05-10 — End: 2023-05-22
  Filled 2023-05-10: qty 10.6, 30d supply, fill #0

## 2023-05-10 MED ORDER — PULMICORT FLEXHALER 90 MCG/ACT IN AEPB
2.0000 | INHALATION_SPRAY | Freq: Two times a day (BID) | RESPIRATORY_TRACT | 6 refills | Status: DC
  Filled 2023-05-10: qty 1, 30d supply, fill #0

## 2023-05-22 ENCOUNTER — Encounter: Payer: Self-pay | Admitting: Medical

## 2023-05-22 ENCOUNTER — Other Ambulatory Visit: Payer: Self-pay

## 2023-05-22 ENCOUNTER — Ambulatory Visit: Attending: Medical | Admitting: Medical

## 2023-05-22 VITALS — BP 94/66 | HR 71 | Ht 66.0 in | Wt 185.6 lb

## 2023-05-22 DIAGNOSIS — I255 Ischemic cardiomyopathy: Secondary | ICD-10-CM | POA: Diagnosis not present

## 2023-05-22 DIAGNOSIS — E782 Mixed hyperlipidemia: Secondary | ICD-10-CM | POA: Diagnosis not present

## 2023-05-22 DIAGNOSIS — I2510 Atherosclerotic heart disease of native coronary artery without angina pectoris: Secondary | ICD-10-CM | POA: Diagnosis not present

## 2023-05-22 DIAGNOSIS — I5020 Unspecified systolic (congestive) heart failure: Secondary | ICD-10-CM | POA: Diagnosis not present

## 2023-05-22 NOTE — Patient Instructions (Signed)
 Medication Instructions:  Your physician recommends that you continue on your current medications as directed. Please refer to the Current Medication list given to you today.  *If you need a refill on your cardiac medications before your next appointment, please call your pharmacy*  Lab Work: None ordered today. If you have labs (blood work) drawn today and your tests are completely normal, you will receive your results only by: MyChart Message (if you have MyChart) OR A paper copy in the mail If you have any lab test that is abnormal or we need to change your treatment, we will call you to review the results.  Testing/Procedures: None ordered today.  Follow-Up: At [facility], you and your health needs are our priority.  As part of our continuing mission to provide you with exceptional heart care, our providers are all part of one team.  This team includes your primary Cardiologist (physician) and Advanced Practice Providers or APPs (Physician Assistants and Nurse Practitioners) who all work together to provide you with the care you need, when you need it.  Your next appointment:   3 month(s)  The format for your next appointment:   In Person  Provider:   Cadence Gennaro Khat, PA-C{

## 2023-05-22 NOTE — Progress Notes (Signed)
 Cardiology Office Note:  .   Date:  05/22/2023  ID:  [name], DOB [birth year], MRN [id number] PCP: Thersia Flax, MD  [facility] Providers Cardiologist:  Antionette Kirks, MD {  History of Present Illness: .   REIN REINING is a 78 y.o. 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   Patient was sent to the ER 11/17/2022 for chest pain and elevated troponin.  High-sensitivity troponin elevated to 1569.  EKG showed ST depression in the lateral leads.  Heart cath showed diffuse and moderately calcified arteries with severe two-vessel CAD.  Culprit was likely plaque rupture from OM1 and mid left circumflex treated with successful bifurcation angioplasty and kissing stent placement to OM1/mid left circumflex using 2 drug-eluting stents.  Patient was started on DAPT for least 12 months with aspirin  and Brilinta .  Echo showed EF 35 to 40%, mild hypokinesis of the inferoseptal inferior wall and moderate HK of the entire anterolateral, inferolateral and anterior walls with normal RV SF and RVSP.  The patient underwent staged PCI of the RCA on 12/11/2022 and discharged on aspirin  and Brilinta  for least 12 months. Cath showed widely patent left circumflex and mildly elevated LVEDP.    Patient was seen December 2024 and reported that breathing was better.  She reported muscle ache on the left side after taking Crestor , so Crestor  was stopped.. Repeat echo showed LVEF 45-50%, G1DD, mild MR, mild aortic valve calcification with mild valve stenosis.   The patient was admitted late January 2025 for GI bleed.  Hemoglobin was 11.7 decreased from 14.3 two months prior.  Brilinta  was held. ASA was discontinued. CT angio of the abdomen showed concern acute  inflammation involving the right colon near hepatic flexure, most compatible with acute diverticulitis.  No evidence of active GI bleed.  Patient was started on Zosyn .  Hemoglobin down to 8.4 and patient was transfused 1 unit of PRBCs.  The next day she continued to bleed and hemoglobin was down to 7.9 and she became hypotensive, cold, clammy and rapid response was called.  She was given emergent blood transfusion and 1 L bolus and transferred to the ICU.  RBC tagged scan came back positive for active bleeding just below the hepatic flexure.  IR performed embolization.  Emergency protocol blood transfusion and FFP were ordered patient ultimately received 2 more units of blood transfused. She had persistent bleeding and more blood was transfused. H&H remained stable and she was discharged home. Cardiology plan was for monotherapy with Brilinta . P450 test was sent and Dr. Alvenia Aus planned to switch to Plavix  if normal.  The patient was last seen 03/02/23 and Brilinta  was switched to Plavix .  Today, EKG shows NSR with occasional PVCs. She found out she had two bulging disc and 1 is herniated in the lumbar region..she had 2 epidurals with little improvement. She is doing physical therapy. She has no chest pain. She feels unchanged SOB. BP is low, but she denies dizziness or lightheadedness. She does not want to stop Coreg  at this time.   Studies Reviewed: [name]   EKG Interpretation Date/Time:  Tuesday May 22 2023 15:22:44 EDT Ventricular Rate:  71 PR Interval:  180 QRS Duration:  86 QT Interval:  402 QTC Calculation: 436 R Axis:   -  30  Text Interpretation: Sinus rhythm with Premature atrial complexes with Abberant conduction Left axis deviation Minimal voltage criteria for LVH, may be normal variant ( R in aVL ) When compared with ECG of 02-Mar-2023 11:13, Abberant conduction is now Present Confirmed by [name], [name] ([id number]) on 05/22/2023 3:42:55 PM    Echo limited 01/2023 1. Left ventricular ejection fraction,  by estimation, is 45 to 50%. The  left ventricle has moderately decreased function. The left ventricle  demonstrates global hypokinesis. Left ventricular diastolic parameters are  consistent with Grade I diastolic  dysfunction (impaired relaxation).   2. Right ventricular systolic function is normal. The right ventricular  size is normal.   3. The mitral valve is normal in structure. Mild mitral valve  regurgitation. No evidence of mitral stenosis.   4. The aortic valve is calcified. There is moderate calcification of the  aortic valve. Aortic valve regurgitation is not visualized. Mild aortic  valve stenosis. Aortic valve mean gradient measures 13.0 mmHg.   5. The inferior vena cava is normal in size with greater than 50%  respiratory variability, suggesting right atrial pressure of 3 mmHg.    LHC staged PCI 12/11/22   Ost LM lesion is 30% stenosed.   Prox LAD to Mid LAD lesion is 30% stenosed.   Mid LAD lesion is 40% stenosed.   Mid LAD to Dist LAD lesion is 30% stenosed.   Prox RCA to Mid RCA lesion is 30% stenosed.   Dist RCA lesion is 70% stenosed with 90% stenosed side branch in RPDA.   1st Diag lesion is 50% stenosed.   Ost RCA to Prox RCA lesion is 50% stenosed.   RPAV lesion is 40% stenosed.   Non-stenotic Prox Cx to Mid Cx lesion was previously treated.   Non-stenotic 1st Mrg lesion was previously treated.   A drug-eluting stent was successfully placed using a STENT ONYX FRONTIER 2.5X22.   Post intervention, there is a 0% residual stenosis.   Post intervention, the side branch was reduced to 0% residual stenosis. 1.  Widely patent left circumflex stent with no significant restenosis.  Severe distal RCA stenosis extending into the right PDA.  Moderate ostial RCA stenosis with pressure dampening with guide catheter engagement.  However, there was no pressure dampening with diagnostic catheter engagement during most recent angiography. 2.  Left ventricular angiography was not  performed.  Mildly elevated left ventricular end-diastolic pressure at 18 mmHg. 3.  Successful angioplasty and drug-eluting stent placement to the distal right coronary artery extending into the right PDA.   LHC 11/20/23   Prox RCA lesion is 30% stenosed.   Dist RCA lesion is 70% stenosed with 90% stenosed side branch in RPDA.   Prox LAD to Mid LAD lesion is 30% stenosed.   1st Diag lesion is 50% stenosed.   Mid LAD lesion is 40% stenosed.   Mid LAD to Dist LAD lesion is 30% stenosed.   Ost LM lesion is 30% stenosed.   1st Mrg lesion is 99% stenosed.   Prox Cx to Mid Cx lesion is 90% stenosed.   A drug-eluting stent was successfully placed using a STENT ONYX FRONTIER 2.5X26.   A drug-eluting stent was successfully placed using a STENT ONYX FRONTIER 2.5X22.   Post intervention, there is a 0% residual stenosis.   Post intervention, there is a 0% residual stenosis. 1.  Diffuse and moderately calcified coronary arteries with severe two-vessel coronary artery disease.  The culprit for myocardial infarction seems to be plaque  rupture and OM1 and mid left circumflex.  In addition, there is severe stenosis in the distal RCA extending into the ostium of the right PDA.  The LAD has moderate disease in multiple areas. 2.  Left ventricular angiography was not performed.  EF was moderately reduced by echo.  LVEDP was mildly elevated. 3.  Successful complex bifurcation angioplasty and kissing stent placement to OM1/mid left circumflex using 2 drug-eluting stents.   Physical Exam:   VS:  BP 94/66   Pulse 71   Ht 5\' 6"  (1.676 m)   Wt 185 lb 9.6 oz (84.2 kg)   SpO2 98%   BMI 29.96 kg/m    Wt Readings from Last 3 Encounters:  05/22/23 185 lb 9.6 oz (84.2 kg)  03/30/23 187 lb 12.8 oz (85.2 kg)  03/06/23 187 lb 9.6 oz (85.1 kg)    GEN: Well nourished, well developed in no acute distress NECK: No JVD; No carotid bruits CARDIAC: RRR, no murmurs, rubs, gallops RESPIRATORY:  Clear to auscultation without  rales, wheezing or rhonchi  ABDOMEN: Soft, non-tender, non-distended EXTREMITIES:  No edema; No deformity   ASSESSMENT AND PLAN: .    CAD s/p PCI/DES 11/2022 Patient is overall doing well. She was previously changed Brilinta  to Plavix  for SOB and GIB. ASA previously stopped for GIB. She denies chest pain, and reports unchanged SOB. She is currently doing PT for her back. Continue Plavix  75mg  daily and zetia  10mg  daily.   HFmrEF Repeat echo showed improved EF 45-50%, G1DD, mild valve disease. She is euvolemic on exam. BP is low today. She is on low dose Coreg , which she does not want to stop at this time. We will continue Coreg  3.125mg BID.   HLD LDL 108, TG 150, HDL 51, total chol 189. LDL goal<70. She does not tolerate statins. She has Repatha at home, but has not started it. Continue Zetia  10mg  daily. She was encouraged to start Repatha.        Dispo: Follow-up in 3 months  Signed, Maria Frazer Rebekah Canada, PA-C

## 2023-05-28 ENCOUNTER — Other Ambulatory Visit: Payer: Self-pay

## 2023-06-14 ENCOUNTER — Other Ambulatory Visit: Payer: Self-pay

## 2023-06-14 ENCOUNTER — Ambulatory Visit (INDEPENDENT_AMBULATORY_CARE_PROVIDER_SITE_OTHER): Payer: Medicare Other | Admitting: Internal Medicine

## 2023-06-14 ENCOUNTER — Encounter: Payer: Self-pay | Admitting: Internal Medicine

## 2023-06-14 VITALS — BP 124/82 | HR 75 | Ht 66.0 in | Wt 190.2 lb

## 2023-06-14 DIAGNOSIS — E669 Obesity, unspecified: Secondary | ICD-10-CM | POA: Diagnosis not present

## 2023-06-14 DIAGNOSIS — G4733 Obstructive sleep apnea (adult) (pediatric): Secondary | ICD-10-CM

## 2023-06-14 DIAGNOSIS — I5020 Unspecified systolic (congestive) heart failure: Secondary | ICD-10-CM

## 2023-06-14 DIAGNOSIS — E785 Hyperlipidemia, unspecified: Secondary | ICD-10-CM

## 2023-06-14 DIAGNOSIS — E1169 Type 2 diabetes mellitus with other specified complication: Secondary | ICD-10-CM

## 2023-06-14 DIAGNOSIS — I5022 Chronic systolic (congestive) heart failure: Secondary | ICD-10-CM | POA: Diagnosis not present

## 2023-06-14 DIAGNOSIS — E1159 Type 2 diabetes mellitus with other circulatory complications: Secondary | ICD-10-CM

## 2023-06-14 DIAGNOSIS — I152 Hypertension secondary to endocrine disorders: Secondary | ICD-10-CM | POA: Diagnosis not present

## 2023-06-14 LAB — LDL CHOLESTEROL, DIRECT: Direct LDL: 157 mg/dL

## 2023-06-14 LAB — MICROALBUMIN / CREATININE URINE RATIO
Creatinine,U: 97.1 mg/dL
Microalb Creat Ratio: UNDETERMINED mg/g (ref 0.0–30.0)
Microalb, Ur: <0.7 mg/dL

## 2023-06-14 LAB — COMPREHENSIVE METABOLIC PANEL WITH GFR
ALT: 9 U/L (ref 0–35)
AST: 15 U/L (ref 0–37)
Albumin: 4.2 g/dL (ref 3.5–5.2)
Alkaline Phosphatase: 65 U/L (ref 39–117)
BUN: 16 mg/dL (ref 6–23)
CO2: 29 meq/L (ref 19–32)
Calcium: 9.2 mg/dL (ref 8.4–10.5)
Chloride: 103 meq/L (ref 96–112)
Creatinine, Ser: 0.96 mg/dL (ref 0.40–1.20)
GFR: 56.58 mL/min — ABNORMAL LOW (ref >60.00)
Glucose, Bld: 111 mg/dL — ABNORMAL HIGH (ref 70–99)
Potassium: 4.5 meq/L (ref 3.5–5.1)
Sodium: 139 meq/L (ref 135–145)
Total Bilirubin: 0.9 mg/dL (ref 0.2–1.2)
Total Protein: 7.3 g/dL (ref 6.0–8.3)

## 2023-06-14 LAB — LIPID PANEL
Cholesterol: 219 mg/dL — ABNORMAL HIGH (ref 0–200)
HDL: 50.2 mg/dL (ref >39.00)
LDL Cholesterol: 139 mg/dL — ABNORMAL HIGH (ref 0–99)
NonHDL: 169.11
Total CHOL/HDL Ratio: 4
Triglycerides: 151 mg/dL — ABNORMAL HIGH (ref 0.0–149.0)
VLDL: 30.2 mg/dL (ref 0.0–40.0)

## 2023-06-14 LAB — HEMOGLOBIN A1C: Hgb A1c MFr Bld: 6.2 % (ref 4.6–6.5)

## 2023-06-14 MED ORDER — GABAPENTIN 100 MG PO CAPS
200.0000 mg | ORAL_CAPSULE | Freq: Two times a day (BID) | ORAL | 0 refills | Status: DC
  Filled 2023-06-14: qty 49, 12d supply, fill #0

## 2023-06-14 NOTE — Progress Notes (Addendum)
 Subjective:  Patient ID: [name], female    DOB: Nov 14, 1945  Age: 78 y.o. MRN: [id number]  CC: The primary encounter diagnosis was Obesity, diabetes, and hypertension syndrome (HCC). Diagnoses of Hyperlipidemia associated with type 2 diabetes mellitus (HCC), HFrEF (heart failure with reduced ejection fraction) (HCC), Chronic HFrEF (heart failure with reduced ejection fraction) (HCC), and OSA on CPAP were also pertinent to this visit.   HPI [name] presents for  Chief Complaint  Patient presents with   Medical Management of Chronic Issues   1) Type 2 DM:   She  feels generally well,  but is not  exercising regularly or trying to lose weight due chronic lo back pain.   T2DM: . Checking  blood sugars less than once daily at variable times, usually only if she feels she may be having a hypoglycemic event. .  BS have been under 130 fasting and < 150 post prandially.  Denies any recent hypoglyemic events.  Taking   medications as directed. Following a carbohydrate modified diet 6 days per week. Denies numbness, burning and tingling of extremities. Appetite is good.    2) CAD:  she has not been taking anything to lower her cholesterol  because she has had myalgias with statin trial; ;  she has been prescribed Zetia  and Repatha  but has deferred both  She denies chest pain.  Last cardiology follow up reviewed   3) DDD with sciatica : she is receiving PT .  The gabapentin  has not helped. Discussed weaning   4) OSA:  she is wearing CPAP but not using the supplemental oxygen that has been prescribed.     Lab Results  Component Value Date   HGBA1C 6.2 06/14/2023      Outpatient Medications Prior to Visit  Medication Sig Dispense Refill   albuterol  (VENTOLIN  HFA) 108 (90 Base) MCG/ACT inhaler Inhale 1-2 puffs into the lungs every 4-6 hours as needed for cough/wheeze. 18 g 0   ALPRAZolam  (XANAX ) 0.25 MG tablet Take 1 tablet (0.25 mg total) by mouth 2 (two) times daily as needed for  anxiety. 30 tablet 0   bimatoprost  (LUMIGAN ) 0.01 % SOLN Place 1 drop into both eyes at bedtime. 2.5 mL 5   budesonide  (PULMICORT ) 180 MCG/ACT inhaler Inhale 2 puffs into the lungs 2 (two) times daily.     carvedilol  (COREG ) 3.125 MG tablet Take 1 tablet (3.125 mg total) by mouth 2 (two) times daily with a meal. 180 tablet 3   cetirizine (ZYRTEC) 10 MG tablet Take 10 mg by mouth daily.     clopidogrel  (PLAVIX ) 75 MG tablet Take 1 tablet (75 mg total) by mouth daily. 90 tablet 3   EPINEPHrine 0.3 mg/0.3 mL IJ SOAJ injection as needed.     HYDROcodone -acetaminophen  (NORCO/VICODIN) 5-325 MG tablet Take 1 tablet by mouth 2 (two) times daily as needed. 14 tablet 0   methocarbamol  (ROBAXIN ) 500 MG tablet Take 0.5-1 tablets (250-500 mg total) by mouth 2 (two) times daily as needed for muscle spasms 30 tablet 5   MILK THISTLE PO Take 1 capsule by mouth daily.      nitroGLYCERIN  (NITROSTAT ) 0.4 MG SL tablet Place 1 tablet (0.4 mg total) under the tongue every 5 (five) minutes for up to 3 doses as needed for chest pain. 25 tablet 0   polyethylene glycol (MIRALAX  / GLYCOLAX ) 17 g packet Take 17 g by mouth daily as needed for mild constipation. 14 each 0   Probiotic Product (PROBIOTIC  COMPLEX ACIDOPHILUS PO) Take 1 capsule by mouth daily.     sodium chloride  (OCEAN) 0.65 % SOLN nasal spray Place 1 spray into the nose as needed.     gabapentin  (NEURONTIN ) 300 MG capsule Take 1 capsule (300 mg total) by mouth 2 (two) times daily. 60 capsule 2   docusate sodium  (COLACE) 100 MG capsule Take 1 capsule (100 mg total) by mouth 2 (two) times daily. 10 capsule 0   No facility-administered medications prior to visit.    Review of Systems;  Patient denies headache, fevers, malaise, unintentional weight loss, skin rash, eye pain, sinus congestion and sinus pain, sore throat, dysphagia,  hemoptysis , cough, dyspnea, wheezing, chest pain, palpitations, orthopnea, edema, abdominal pain, nausea, melena, diarrhea,  constipation, flank pain, dysuria, hematuria, urinary  Frequency, nocturia, numbness, tingling, seizures,  Focal weakness, Loss of consciousness,  Tremor, insomnia, depression, anxiety, and suicidal ideation.      Objective:  BP 124/82   Pulse 75   Ht 5\' 6"  (1.676 m)   Wt 190 lb 3.2 oz (86.3 kg)   SpO2 98%   BMI 30.70 kg/m   BP Readings from Last 3 Encounters:  06/14/23 124/82  05/22/23 94/66  03/30/23 110/64    Wt Readings from Last 3 Encounters:  06/14/23 190 lb 3.2 oz (86.3 kg)  05/22/23 185 lb 9.6 oz (84.2 kg)  03/30/23 187 lb 12.8 oz (85.2 kg)    Physical Exam Vitals reviewed.  Constitutional:      General: She is not in acute distress.    Appearance: Normal appearance. She is normal weight. She is not ill-appearing, toxic-appearing or diaphoretic.  HENT:     Head: Normocephalic.  Eyes:     General: No scleral icterus.       Right eye: No discharge.        Left eye: No discharge.     Conjunctiva/sclera: Conjunctivae normal.  Cardiovascular:     Rate and Rhythm: Normal rate and regular rhythm.     Heart sounds: Normal heart sounds.  Pulmonary:     Effort: Pulmonary effort is normal. No respiratory distress.     Breath sounds: Normal breath sounds.  Musculoskeletal:        General: Normal range of motion.  Skin:    General: Skin is warm and dry.  Neurological:     General: No focal deficit present.     Mental Status: She is alert and oriented to person, place, and time. Mental status is at baseline.  Psychiatric:        Mood and Affect: Mood normal.        Behavior: Behavior normal.        Thought Content: Thought content normal.        Judgment: Judgment normal.     Lab Results  Component Value Date   HGBA1C 6.2 06/14/2023   HGBA1C 6.2 (H) 02/09/2023   HGBA1C 7.1 (H) 11/13/2022    Lab Results  Component Value Date   CREATININE 0.96 06/14/2023   CREATININE 0.81 03/05/2023   CREATININE 0.80 02/16/2023    Lab Results  Component Value Date    WBC 7.2 03/05/2023   HGB 13.3 03/05/2023   HCT 39.9 03/05/2023   PLT 315.0 03/05/2023   GLUCOSE 111 (H) 06/14/2023   CHOL 219 (H) 06/14/2023   TRIG 151.0 (H) 06/14/2023   HDL 50.20 06/14/2023   LDLDIRECT 157.0 06/14/2023   LDLCALC 139 (H) 06/14/2023   ALT 9 06/14/2023   AST 15 06/14/2023  NA 139 06/14/2023   K 4.5 06/14/2023   CL 103 06/14/2023   CREATININE 0.96 06/14/2023   BUN 16 06/14/2023   CO2 29 06/14/2023   TSH 3.43 11/13/2022   INR 1.1 11/17/2022   HGBA1C 6.2 06/14/2023   MICROALBUR <0.7 06/14/2023    MR LUMBAR SPINE WO CONTRAST Result Date: 02/28/2023 CLINICAL DATA:  Low back pain radiating to the left hip groin, and thigh to the knee since mid November. No injury or prior surgery. EXAM: MRI LUMBAR SPINE WITHOUT CONTRAST TECHNIQUE: Multiplanar, multisequence MR imaging of the lumbar spine was performed. No intravenous contrast was administered. COMPARISON:  CT abdomen pelvis dated February 08, 2023. FINDINGS: Segmentation:  Standard. Alignment:  Physiologic. Vertebrae:  No fracture, evidence of discitis, or bone lesion. Conus medullaris and cauda equina: Conus extends to the L1-L2 level. Conus and cauda equina appear normal. Paraspinal and other soft tissues: Negative. Disc levels: T12-L1: Minimal leftward disc bulging and endplate spurring. No stenosis. L1-L2:  Mild disc bulging.  No stenosis. L2-L3:  Negative. L3-L4: Mild disc bulging with superimposed large left foraminal disc protrusion with impingement of the exiting left L3 nerve root. Mild bilateral facet arthropathy. Mild-to-moderate left neuroforaminal stenosis. No spinal canal or right neuroforaminal stenosis. L4-L5: Mild disc bulging encroaching on the exited bilateral L4 nerve roots without displacement. Mild bilateral facet arthropathy. Mild right lateral recess stenosis. Mild left-greater-than-right neuroforaminal stenosis. No spinal canal stenosis. L5-S1: Negative disc. Moderate bilateral facet arthropathy. No  stenosis. IMPRESSION: 1. Large left foraminal disc protrusion at L3-L4 with impingement of the exiting left L3 nerve root. Electronically Signed   By: Aleta Anda M.D.   On: 02/28/2023 19:16    Assessment & Plan:  .Obesity, diabetes, and hypertension syndrome (HCC) Assessment & Plan: Thus far she has not tolerated metformin  and declines use of GLP 1 agonist.  Statin myalgia also reported.  She has declined trial of  SGLT2 ihibitor .  Continue telmisartan  and advised to start Zetia   Lab Results  Component Value Date   HGBA1C 6.2 06/14/2023   Lab Results  Component Value Date   MICROALBUR <0.7 06/14/2023   MICROALBUR <0.7 06/15/2022       Orders: -     Hemoglobin A1c -     Comprehensive metabolic panel with GFR -     Microalbumin / creatinine urine ratio  Hyperlipidemia associated with type 2 diabetes mellitus (HCC) Assessment & Plan: Historically untreated due to statin intolerance.  She had also declined  trial of Zetia  and PCSK 9 inhibitor, in the past and as rosuvastatin  since discharge form hospital for NSTEMI.  Strongly advised to begin trial of zetia    Lab Results  Component Value Date   CHOL 219 (H) 06/14/2023   HDL 50.20 06/14/2023   LDLCALC 139 (H) 06/14/2023   LDLDIRECT 157.0 06/14/2023   TRIG 151.0 (H) 06/14/2023   CHOLHDL 4 06/14/2023      Orders: -     Lipid panel -     LDL cholesterol, direct -     Comprehensive metabolic panel with GFR; Future  HFrEF (heart failure with reduced ejection fraction) (HCC)  Chronic HFrEF (heart failure with reduced ejection fraction) [facility]) Assessment & Plan: Repeat ECHO in Jan 2025 showed  improvement in pump function to  LVEF 45-50% (mildly reduced), impaired relaxation, mild valve disease Advised to continue sodium restriction. And begin walking for exercise   OSA on CPAP Assessment & Plan: Diagnosed by sleep study. She is wearing her CPAP every night  a minimum of 6 hours per night and notes improved daytime  wakefulness and decreased fatigue  However she has not been using supplemental oxygen despite evidence of hypoxemia.  Counselling given,  advised to add the supplemental oxygen   Other orders -     Gabapentin ; Take 2 capsules (200 mg total) by mouth 2 (two) times daily. Wean as directed by physician.  Dispense: 49 capsule; Refill: 0     I spent 34 minutes on the day of this face to face encounter reviewing patient's  most recent visit with cardiology,    prior relevant surgical and non surgical procedures, recent  labs and imaging studies, counseling on lipid management,  reviewing the assessment and plan with patient, and post visit ordering and reviewing of  diagnostics and therapeutics with patient  .   Follow-up: Return in about 3 months (around 09/14/2023) for follow up diabetes.   Thersia Flax, MD

## 2023-06-14 NOTE — Patient Instructions (Addendum)
 Gabapentin  should be weaned as follows:  200 mg twice daily  for one week  100 mg twice daily for one week  100 mg  daily for one week   You can continue it any lower dose if needed,    You need to use the oxygen at night,  because low oxygen levels put a strain on the heart and lead to irregular heart rhythms and increase your risk of stroke   You need to be on SOME FORM OF CHOLESTEROL MEDICATION to prevent placque rupture.  ZETIA  AND REPATHA DO NOT CAUSE MUSCLE PAIN BECAUSE THEY ARE NOT STATINS

## 2023-06-17 ENCOUNTER — Ambulatory Visit: Payer: Self-pay | Admitting: Internal Medicine

## 2023-06-17 ENCOUNTER — Encounter: Payer: Self-pay | Admitting: Internal Medicine

## 2023-06-17 NOTE — Assessment & Plan Note (Signed)
 Thus far she has not tolerated metformin  and declines use of GLP 1 agonist.  Statin myalgia also reported.  She has declined trial of  SGLT2 ihibitor .  Continue telmisartan  and advised to start Zetia   Lab Results  Component Value Date   HGBA1C 6.2 06/14/2023   Lab Results  Component Value Date   MICROALBUR <0.7 06/14/2023   MICROALBUR <0.7 06/15/2022

## 2023-06-17 NOTE — Addendum Note (Signed)
 Addended by: Thersia Flax on: 06/17/2023 07:33 PM   Modules accepted: Orders

## 2023-06-17 NOTE — Assessment & Plan Note (Signed)
 Diagnosed by sleep study. She is wearing her CPAP every night a minimum of 6 hours per night and notes improved daytime wakefulness and decreased fatigue  However she has not been using supplemental oxygen despite evidence of hypoxemia.  Counselling given,  advised to add the supplemental oxygen

## 2023-06-17 NOTE — Assessment & Plan Note (Addendum)
 Repeat ECHO in Jan 2025 showed  improvement in pump function to  LVEF 45-50% (mildly reduced), impaired relaxation, mild valve disease Advised to continue sodium restriction. And begin walking for exercise

## 2023-06-17 NOTE — Assessment & Plan Note (Addendum)
 Historically untreated due to statin intolerance.  She had also declined  trial of Zetia  and PCSK 9 inhibitor, in the past and as rosuvastatin  since discharge form hospital for NSTEMI.  Strongly advised to begin trial of zetia    Lab Results  Component Value Date   CHOL 219 (H) 06/14/2023   HDL 50.20 06/14/2023   LDLCALC 139 (H) 06/14/2023   LDLDIRECT 157.0 06/14/2023   TRIG 151.0 (H) 06/14/2023   CHOLHDL 4 06/14/2023

## 2023-06-20 ENCOUNTER — Telehealth: Payer: Self-pay

## 2023-06-20 NOTE — Telephone Encounter (Signed)
 FYI

## 2023-06-20 NOTE — Telephone Encounter (Signed)
 I was unable to addend previous message so I started a new telephone note.  I received call from [facility].  Patient had called back to state she spoke with Lexi from [facility] previously, who read Dr. Ammon Bales Tullo's message to her.  Patient states Lexi scheduled her for a lab visit on 07/23/2023, and she would like to know if this is a fasting lab.  I let patient know that this is not a fasting lab.  Patient states according to Dr. Berl Breed message, she believes she needs to schedule a follow-up visit with Dr. Madelon Scheuermann after her lab visit on 07/23/2023.  I scheduled an appointment for patient to meet with Dr. Madelon Scheuermann on 07/25/2023.  Patient states she would like for Dr. Madelon Scheuermann to know that she started taking the Zetia  again right after her last visit with Dr. Madelon Scheuermann.  Patient states she now has the oxygen hooked up to her CPAP machine at night.

## 2023-06-20 NOTE — Telephone Encounter (Signed)
 Copied from CRM [id number]. Topic: Clinical - Lab/Test Results >> Jun 20, 2023  3:31 PM [name] wrote: Reason for CRM: Patient called in regarding her labs, relayed message from PCP that was attached to these labs. Got patient scheduled for future lab visit. Patient also wanted to relay that since her visit on May 29th she started taking Zetia , and she is also hooking up oxygen to her CPAP machine.

## 2023-06-21 MED ORDER — EZETIMIBE 10 MG PO TABS: 10.0000 mg | ORAL_TABLET | Freq: Every day | ORAL | Status: DC

## 2023-06-21 NOTE — Telephone Encounter (Signed)
 Error

## 2023-07-17 ENCOUNTER — Other Ambulatory Visit: Payer: Self-pay

## 2023-07-23 ENCOUNTER — Other Ambulatory Visit (INDEPENDENT_AMBULATORY_CARE_PROVIDER_SITE_OTHER)

## 2023-07-23 DIAGNOSIS — E785 Hyperlipidemia, unspecified: Secondary | ICD-10-CM

## 2023-07-23 DIAGNOSIS — E1169 Type 2 diabetes mellitus with other specified complication: Secondary | ICD-10-CM | POA: Diagnosis not present

## 2023-07-23 LAB — COMPREHENSIVE METABOLIC PANEL WITH GFR
ALT: 8 U/L (ref 0–35)
AST: 14 U/L (ref 0–37)
Albumin: 4.2 g/dL (ref 3.5–5.2)
Alkaline Phosphatase: 59 U/L (ref 39–117)
BUN: 12 mg/dL (ref 6–23)
CO2: 29 meq/L (ref 19–32)
Calcium: 9.2 mg/dL (ref 8.4–10.5)
Chloride: 103 meq/L (ref 96–112)
Creatinine, Ser: 0.92 mg/dL (ref 0.40–1.20)
GFR: 59.5 mL/min — ABNORMAL LOW (ref >60.00)
Glucose, Bld: 90 mg/dL (ref 70–99)
Potassium: 4.4 meq/L (ref 3.5–5.1)
Sodium: 138 meq/L (ref 135–145)
Total Bilirubin: 1 mg/dL (ref 0.2–1.2)
Total Protein: 7.4 g/dL (ref 6.0–8.3)

## 2023-07-25 ENCOUNTER — Ambulatory Visit (INDEPENDENT_AMBULATORY_CARE_PROVIDER_SITE_OTHER): Admitting: Internal Medicine

## 2023-07-25 ENCOUNTER — Encounter: Payer: Self-pay | Admitting: Internal Medicine

## 2023-07-25 VITALS — BP 122/60 | HR 81 | Ht 66.0 in | Wt 188.2 lb

## 2023-07-25 DIAGNOSIS — I10 Essential (primary) hypertension: Secondary | ICD-10-CM | POA: Diagnosis not present

## 2023-07-25 DIAGNOSIS — I5022 Chronic systolic (congestive) heart failure: Secondary | ICD-10-CM | POA: Diagnosis not present

## 2023-07-25 DIAGNOSIS — E785 Hyperlipidemia, unspecified: Secondary | ICD-10-CM

## 2023-07-25 DIAGNOSIS — E1169 Type 2 diabetes mellitus with other specified complication: Secondary | ICD-10-CM | POA: Diagnosis not present

## 2023-07-25 DIAGNOSIS — K760 Fatty (change of) liver, not elsewhere classified: Secondary | ICD-10-CM

## 2023-07-25 NOTE — Patient Instructions (Signed)
 Your repeat liver enzymes and electrolytes are NORMAL. Continue ZETIA   We will see you in September and repeat your A1c AT YOUR VISIT (THE OTHER LABS ON aUGUST 26)

## 2023-07-25 NOTE — Assessment & Plan Note (Signed)
 She is tolerating Zetia  after a week or two of loose stools.  LFTS normal.  Will  Repeat lipids in late August

## 2023-07-25 NOTE — Progress Notes (Signed)
 Subjective:  Patient ID: [name], female    DOB: Sep 08, 1945  Age: 78 y.o. MRN: [id number]  CC: The primary encounter diagnosis was Essential hypertension. Diagnoses of Fatty liver, Hyperlipidemia associated with type 2 diabetes mellitus (HCC), and Chronic HFrEF (heart failure with reduced ejection fraction) (HCC) were also pertinent to this visit.   HPI LADAISHA Fletcher presents for  Chief Complaint  Patient presents with   Medical Management of Chronic Issues   Last seen May 29 for follow up on CAD and type 2 DM.  Advised to  consider ZETIA  for lipid management.    HLD:  has been taking Zetia  since last visit.  CMET repeated July 7 ,  lfts normal.  Diarrhea has resolved.   CAD: EF 45% bu las ECHO in January.  Has good days where she does too much followed by days of fatigue.  it.  Did not do cardiopulmonary rehab   was in PT for hip and back  Outpatient Medications Prior to Visit  Medication Sig Dispense Refill   albuterol  (VENTOLIN  HFA) 108 (90 Base) MCG/ACT inhaler Inhale 1-2 puffs into the lungs every 4-6 hours as needed for cough/wheeze. 18 g 0   ALPRAZolam  (XANAX ) 0.25 MG tablet Take 1 tablet (0.25 mg total) by mouth 2 (two) times daily as needed for anxiety. 30 tablet 0   bimatoprost  (LUMIGAN ) 0.01 % SOLN Place 1 drop into both eyes at bedtime. 2.5 mL 5   budesonide  (PULMICORT ) 180 MCG/ACT inhaler Inhale 2 puffs into the lungs 2 (two) times daily.     carvedilol  (COREG ) 3.125 MG tablet Take 1 tablet (3.125 mg total) by mouth 2 (two) times daily with a meal. 180 tablet 3   cetirizine (ZYRTEC) 10 MG tablet Take 10 mg by mouth daily.     clopidogrel  (PLAVIX ) 75 MG tablet Take 1 tablet (75 mg total) by mouth daily. 90 tablet 3   EPINEPHrine 0.3 mg/0.3 mL IJ SOAJ injection as needed.     ezetimibe  (ZETIA ) 10 MG tablet Take 1 tablet (10 mg total) by mouth daily.     gabapentin  (NEURONTIN ) 100 MG capsule Take 2 capsules (200 mg total) by mouth 2 (two) times daily. Wean as directed  by physician. 49 capsule 0   methocarbamol  (ROBAXIN ) 500 MG tablet Take 0.5-1 tablets (250-500 mg total) by mouth 2 (two) times daily as needed for muscle spasms 30 tablet 5   MILK THISTLE PO Take 1 capsule by mouth daily.      nitroGLYCERIN  (NITROSTAT ) 0.4 MG SL tablet Place 1 tablet (0.4 mg total) under the tongue every 5 (five) minutes for up to 3 doses as needed for chest pain. 25 tablet 0   polyethylene glycol (MIRALAX  / GLYCOLAX ) 17 g packet Take 17 g by mouth daily as needed for mild constipation. 14 each 0   Probiotic Product (PROBIOTIC COMPLEX ACIDOPHILUS PO) Take 1 capsule by mouth daily.     sodium chloride  (OCEAN) 0.65 % SOLN nasal spray Place 1 spray into the nose as needed.     HYDROcodone -acetaminophen  (NORCO/VICODIN) 5-325 MG tablet Take 1 tablet by mouth 2 (two) times daily as needed. 14 tablet 0   No facility-administered medications prior to visit.    Review of Systems;  Patient denies headache, fevers, malaise, unintentional weight loss, skin rash, eye pain, sinus congestion and sinus pain, sore throat, dysphagia,  hemoptysis , cough, dyspnea, wheezing, chest pain, palpitations, orthopnea, edema, abdominal pain, nausea, melena, diarrhea, constipation, flank pain, dysuria, hematuria, urinary  Frequency, nocturia, numbness, tingling, seizures,  Focal weakness, Loss of consciousness,  Tremor, insomnia, depression, anxiety, and suicidal ideation.      Objective:  BP 122/60   Pulse 81   Ht 5' 6 (1.676 m)   Wt 188 lb 3.2 oz (85.4 kg)   SpO2 98%   BMI 30.38 kg/m   BP Readings from Last 3 Encounters:  07/25/23 122/60  06/14/23 124/82  05/22/23 94/66    Wt Readings from Last 3 Encounters:  07/25/23 188 lb 3.2 oz (85.4 kg)  06/14/23 190 lb 3.2 oz (86.3 kg)  05/22/23 185 lb 9.6 oz (84.2 kg)    Physical Exam Vitals reviewed.  Constitutional:      General: She is not in acute distress.    Appearance: Normal appearance. She is normal weight. She is not ill-appearing,  toxic-appearing or diaphoretic.  HENT:     Head: Normocephalic.  Eyes:     General: No scleral icterus.       Right eye: No discharge.        Left eye: No discharge.     Conjunctiva/sclera: Conjunctivae normal.  Cardiovascular:     Rate and Rhythm: Normal rate and regular rhythm.     Heart sounds: Normal heart sounds.  Pulmonary:     Effort: Pulmonary effort is normal. No respiratory distress.     Breath sounds: Normal breath sounds.  Musculoskeletal:        General: Normal range of motion.  Skin:    General: Skin is warm and dry.  Neurological:     General: No focal deficit present.     Mental Status: She is alert and oriented to person, place, and time. Mental status is at baseline.  Psychiatric:        Mood and Affect: Mood normal.        Behavior: Behavior normal.        Thought Content: Thought content normal.        Judgment: Judgment normal.     Lab Results  Component Value Date   HGBA1C 6.2 06/14/2023   HGBA1C 6.2 (H) 02/09/2023   HGBA1C 7.1 (H) 11/13/2022    Lab Results  Component Value Date   CREATININE 0.92 07/23/2023   CREATININE 0.96 06/14/2023   CREATININE 0.81 03/05/2023    Lab Results  Component Value Date   WBC 7.2 03/05/2023   HGB 13.3 03/05/2023   HCT 39.9 03/05/2023   PLT 315.0 03/05/2023   GLUCOSE 90 07/23/2023   CHOL 219 (H) 06/14/2023   TRIG 151.0 (H) 06/14/2023   HDL 50.20 06/14/2023   LDLDIRECT 157.0 06/14/2023   LDLCALC 139 (H) 06/14/2023   ALT 8 07/23/2023   AST 14 07/23/2023   NA 138 07/23/2023   K 4.4 07/23/2023   CL 103 07/23/2023   CREATININE 0.92 07/23/2023   BUN 12 07/23/2023   CO2 29 07/23/2023   TSH 3.43 11/13/2022   INR 1.1 11/17/2022   HGBA1C 6.2 06/14/2023   MICROALBUR <0.7 06/14/2023    MR LUMBAR SPINE WO CONTRAST Result Date: 02/28/2023 CLINICAL DATA:  Low back pain radiating to the left hip groin, and thigh to the knee since mid November. No injury or prior surgery. EXAM: MRI LUMBAR SPINE WITHOUT CONTRAST  TECHNIQUE: Multiplanar, multisequence MR imaging of the lumbar spine was performed. No intravenous contrast was administered. COMPARISON:  CT abdomen pelvis dated February 08, 2023. FINDINGS: Segmentation:  Standard. Alignment:  Physiologic. Vertebrae:  No fracture, evidence of discitis, or bone lesion. Conus  medullaris and cauda equina: Conus extends to the L1-L2 level. Conus and cauda equina appear normal. Paraspinal and other soft tissues: Negative. Disc levels: T12-L1: Minimal leftward disc bulging and endplate spurring. No stenosis. L1-L2:  Mild disc bulging.  No stenosis. L2-L3:  Negative. L3-L4: Mild disc bulging with superimposed large left foraminal disc protrusion with impingement of the exiting left L3 nerve root. Mild bilateral facet arthropathy. Mild-to-moderate left neuroforaminal stenosis. No spinal canal or right neuroforaminal stenosis. L4-L5: Mild disc bulging encroaching on the exited bilateral L4 nerve roots without displacement. Mild bilateral facet arthropathy. Mild right lateral recess stenosis. Mild left-greater-than-right neuroforaminal stenosis. No spinal canal stenosis. L5-S1: Negative disc. Moderate bilateral facet arthropathy. No stenosis. IMPRESSION: 1. Large left foraminal disc protrusion at L3-L4 with impingement of the exiting left L3 nerve root. Electronically Signed   By: Elsie ONEIDA Shoulder M.D.   On: 02/28/2023 19:16    Assessment & Plan:  .Essential hypertension Assessment & Plan:  Reports blood pressures at home usually lower than 130/80.  Currently  well controlled on  carvedilol  3.125 mg bid    Fatty liver -     Comprehensive metabolic panel with GFR; Future  Hyperlipidemia associated with type 2 diabetes mellitus (HCC) Assessment & Plan: She is tolerating Zetia  after a week or two of loose stools.  LFTS normal.  Will  Repeat lipids in late August   Orders: -     Lipid Panel w/reflex Direct LDL; Future  Chronic HFrEF (heart failure with reduced ejection  fraction) [facility]) Assessment & Plan: Repeat ECHO in Jan 2025 showed  improvement in pump function to  LVEF 45-50% (mildly reduced), impaired relaxation, mild valve disease Advised to continue sodium restriction. She has declined cardiopulmonary PT but completed PT for back and hip pain . SABRA However she becomes quite fatigued and short of breath walking across the parking lot and has requested a handicapped sticker       I spent 34 minutes on the day of this face to face encounter reviewing patient's  most recent visit with cardiology,  nephrology, ,  prior relevant surgical and non surgical procedures, recent  labs and imaging studies, counseling on weight management,  reviewing the assessment and plan with patient, and post visit ordering and reviewing of  diagnostics and therapeutics with patient  .   Follow-up: No follow-ups on file.   Verneita LITTIE Kettering, MD

## 2023-07-25 NOTE — Assessment & Plan Note (Signed)
 Repeat ECHO in Jan 2025 showed  improvement in pump function to  LVEF 45-50% (mildly reduced), impaired relaxation, mild valve disease Advised to continue sodium restriction. She has declined cardiopulmonary PT but completed PT for back and hip pain . SABRA However she becomes quite fatigued and short of breath walking across the parking lot and has requested a handicapped sticker

## 2023-07-25 NOTE — Assessment & Plan Note (Addendum)
 Reports blood pressures at home usually lower than 130/80.  Currently  well controlled on  carvedilol  3.125 mg bid

## 2023-08-16 ENCOUNTER — Other Ambulatory Visit: Payer: Self-pay

## 2023-08-16 ENCOUNTER — Other Ambulatory Visit: Payer: Self-pay | Admitting: Internal Medicine

## 2023-08-16 MED ORDER — EZETIMIBE 10 MG PO TABS
10.0000 mg | ORAL_TABLET | Freq: Every day | ORAL | 0 refills | Status: DC
  Filled 2023-08-16: qty 90, 90d supply, fill #0

## 2023-08-16 NOTE — Telephone Encounter (Signed)
 Copied from CRM [id number]. Topic: Clinical - Medication Refill >> Aug 16, 2023  8:20 AM Berneda FALCON wrote: Medication:  ezetimibe  (ZETIA ) 10 MG tablet   Has the patient contacted their pharmacy? Yes (Agent: If no, request that the patient contact the pharmacy for the refill. If patient does not wish to contact the pharmacy document the reason why and proceed with request.) (Agent: If yes, when and what did the pharmacy advise?)  This is the patient's preferred pharmacy:  [facility] [address] [place] [address] Phone: [phone] Fax: [phone]  Is this the correct pharmacy for this prescription? Yes If no, delete pharmacy and type the correct one.   Has the prescription been filled recently? No  Is the patient out of the medication? No  Has the patient been seen for an appointment in the last year OR does the patient have an upcoming appointment? Yes  Can we respond through MyChart? Yes  Agent: Please be advised that Rx refills may take up to 3 business days. We ask that you follow-up with your pharmacy.

## 2023-08-28 ENCOUNTER — Ambulatory Visit: Attending: Medical | Admitting: Medical

## 2023-08-28 ENCOUNTER — Encounter: Payer: Self-pay | Admitting: Medical

## 2023-08-28 VITALS — BP 120/80 | HR 70 | Ht 66.0 in | Wt 190.6 lb

## 2023-08-28 DIAGNOSIS — E782 Mixed hyperlipidemia: Secondary | ICD-10-CM | POA: Diagnosis not present

## 2023-08-28 DIAGNOSIS — I2510 Atherosclerotic heart disease of native coronary artery without angina pectoris: Secondary | ICD-10-CM | POA: Diagnosis not present

## 2023-08-28 DIAGNOSIS — I5020 Unspecified systolic (congestive) heart failure: Secondary | ICD-10-CM

## 2023-08-28 NOTE — Progress Notes (Signed)
 Cardiology Office Note   Date:  08/28/2023  ID:  [name] [birth year], MRN [id number] PCP: Maria Verneita CROME, MD  [facility] Providers Cardiologist:  Maria Cage, MD     History of Present Illness [name] is a 78 y.o. 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   Patient was sent to the ER 11/17/2022 for chest pain and elevated troponin.  High-sensitivity troponin elevated to 1569.  EKG showed ST depression in the lateral leads.  Heart cath showed diffuse and moderately calcified arteries with severe two-vessel CAD.  Culprit was likely plaque rupture from OM1 and mid left circumflex treated with successful bifurcation angioplasty and kissing stent placement to OM1/mid left circumflex using 2 drug-eluting stents.  Patient was started on DAPT for least 12 months with aspirin  and Brilinta .  Echo showed EF 35 to 40%, mild hypokinesis of the inferoseptal inferior wall and moderate HK of the entire anterolateral, inferolateral and anterior walls with normal RV SF and RVSP.  The patient underwent staged PCI of the RCA on 12/11/2022 and discharged on aspirin  and Brilinta  for least 12 months. Cath showed widely patent left circumflex and mildly elevated LVEDP.    Patient was seen December 2024 and reported that breathing was better.  She reported muscle ache on the left side after taking Crestor , so Crestor  was stopped.. Repeat echo showed LVEF 45-50%, G1DD, mild MR, mild aortic valve calcification with mild valve stenosis.   The patient was admitted late January 2025 for GI bleed.  Hemoglobin was 11.7 decreased from 14.3 two months prior.  Brilinta  was held. ASA was discontinued. CT angio of the abdomen showed concern acute  inflammation involving the right colon near hepatic flexure, most compatible with acute diverticulitis.  No evidence of active GI bleed.  Patient was started on Zosyn .  Hemoglobin down to 8.4 and patient was transfused 1 unit of PRBCs.  The next day she continued to bleed and hemoglobin was down to 7.9 and she became hypotensive, cold, clammy and rapid response was called.  She was given emergent blood transfusion and 1 L bolus and transferred to the ICU.  RBC tagged scan came back positive for active bleeding just below the hepatic flexure.  IR performed embolization.  Emergency protocol blood transfusion and FFP were ordered patient ultimately received 2 more units of blood transfused. She had persistent bleeding and more blood was transfused. H&H remained stable and she was discharged home. Cardiology plan was for monotherapy with Brilinta . P450 test was sent and Dr. Cage planned to switch to Plavix  if normal.   The patient was last seen 03/02/23 and Brilinta  was switched to Plavix .  Patient was last seen 05/22/2023 reporting unchanged shortness of breath.  She denies bleeding issues on Plavix .  Blood pressure was low but patient did not want to stop Coreg .  Today, the patient reports persistent shortness of breath and occasional chest discomfort. She says it's been since her heart attack in November last year. She feels she is unable to do much without becoming short of breath and having to take a break. She is able to do minimal household chores. She was unable to do cardiac rehab due to pain in her back. Prior Back MRI showed 2 bulging discs.  Surgeon did not want to operate due to comobidities. She went to PT.  Studies Reviewed EKG Interpretation Date/Time:  Tuesday August 28 2023 10:35:25 EDT Ventricular Rate:  70 PR Interval:  184 QRS Duration:  92 QT Interval:  408 QTC Calculation: 440 R Axis:   -29  Text Interpretation: Normal sinus rhythm with sinus arrhythmia Minimal voltage criteria for  LVH, may be normal variant ( R in aVL ) Septal infarct , age undetermined When compared with ECG of 22-May-2023 15:22, Abberant conduction is no longer Present Confirmed by [name] ([id number]) on 08/28/2023 11:13:18 AM    Echo limited 01/2023 1. Left ventricular ejection fraction, by estimation, is 45 to 50%. The  left ventricle has moderately decreased function. The left ventricle  demonstrates global hypokinesis. Left ventricular diastolic parameters are  consistent with Grade I diastolic  dysfunction (impaired relaxation).   2. Right ventricular systolic function is normal. The right ventricular  size is normal.   3. The mitral valve is normal in structure. Mild mitral valve  regurgitation. No evidence of mitral stenosis.   4. The aortic valve is calcified. There is moderate calcification of the  aortic valve. Aortic valve regurgitation is not visualized. Mild aortic  valve stenosis. Aortic valve mean gradient measures 13.0 mmHg.   5. The inferior vena cava is normal in size with greater than 50%  respiratory variability, suggesting right atrial pressure of 3 mmHg.    LHC staged PCI 12/11/22   Ost LM lesion is 30% stenosed.   Prox LAD to Mid LAD lesion is 30% stenosed.   Mid LAD lesion is 40% stenosed.   Mid LAD to Dist LAD lesion is 30% stenosed.   Prox RCA to Mid RCA lesion is 30% stenosed.   Dist RCA lesion is 70% stenosed with 90% stenosed side branch in RPDA.   1st Diag lesion is 50% stenosed.   Ost RCA to Prox RCA lesion is 50% stenosed.   RPAV lesion is 40% stenosed.   Non-stenotic Prox Cx to Mid Cx lesion was previously treated.   Non-stenotic 1st Mrg lesion was previously treated.   A drug-eluting stent was successfully placed using a STENT ONYX FRONTIER 2.5X22.   Post intervention, there is a 0% residual stenosis.   Post intervention, the side branch was reduced to 0% residual stenosis. 1.  Widely patent left circumflex stent with no significant restenosis.  Severe  distal RCA stenosis extending into the right PDA.  Moderate ostial RCA stenosis with pressure dampening with guide catheter engagement.  However, there was no pressure dampening with diagnostic catheter engagement during most recent angiography. 2.  Left ventricular angiography was not performed.  Mildly elevated left ventricular end-diastolic pressure at 18 mmHg. 3.  Successful angioplasty and drug-eluting stent placement to the distal right coronary artery extending into the right PDA.   LHC 11/20/23   Prox RCA lesion is 30% stenosed.   Dist RCA lesion is 70% stenosed with 90% stenosed side branch in RPDA.   Prox LAD to Mid LAD lesion is 30% stenosed.   1st Diag lesion is 50% stenosed.   Mid LAD lesion is 40% stenosed.   Mid LAD to Dist LAD lesion is 30% stenosed.   Ost LM lesion is 30% stenosed.   1st Mrg lesion is 99% stenosed.   Prox Cx to Mid Cx lesion is 90% stenosed.   A drug-eluting stent was successfully placed using a STENT ONYX FRONTIER 2.5X26.   A drug-eluting stent was successfully placed using  a STENT ONYX FRONTIER 2.5X22.   Post intervention, there is a 0% residual stenosis.   Post intervention, there is a 0% residual stenosis. 1.  Diffuse and moderately calcified coronary arteries with severe two-vessel coronary artery disease.  The culprit for myocardial infarction seems to be plaque rupture and OM1 and mid left circumflex.  In addition, there is severe stenosis in the distal RCA extending into the ostium of the right PDA.  The LAD has moderate disease in multiple areas. 2.  Left ventricular angiography was not performed.  EF was moderately reduced by echo.  LVEDP was mildly elevated. 3.  Successful complex bifurcation angioplasty and kissing stent placement to OM1/mid left circumflex using 2 drug-eluting stents.      Physical Exam VS:  BP 120/80 (BP Location: Left Arm, Patient Position: Sitting, Cuff Size: Normal)   Pulse 70   Ht 5' 6 (1.676 m)   Wt 190 lb 9.6 oz (86.5  kg)   SpO2 97%   BMI 30.76 kg/m        Wt Readings from Last 3 Encounters:  08/28/23 190 lb 9.6 oz (86.5 kg)  07/25/23 188 lb 3.2 oz (85.4 kg)  06/14/23 190 lb 3.2 oz (86.3 kg)    GEN: Well nourished, well developed in no acute distress NECK: No JVD; No carotid bruits CARDIAC: RRR, no murmurs, rubs, gallops RESPIRATORY:  Clear to auscultation without rales, wheezing or rhonchi  ABDOMEN: Soft, non-tender, non-distended EXTREMITIES:  No edema; No deformity   ASSESSMENT AND PLAN  CAD s/p PCI/DES 11/2022 The patient reports shortness of breath and occasional chest discomfort since stenting. It is not similar to prior pain before the stent. Since then she had a GIB and was found to have 2 bulging discs in her back. She did PT and is able to do minimal activity, but feels this is still greatly limited.  EKG today shows normal sinus rhythm with no ischemic changes.  Patient previously did not do cardiac rehab due to back pain.  I recommended she do cardiac rehab at this time.  If patient has persistent shortness of breath and/or chest pain, can consider stress test versus heart cath.  Patient is not interested in trying medication.  Continue Plavix , Zetia  mg daily and Coreg  mg once daily.  HFmrEF Repeat echo showed LVEF 45-50%, G1DD, mild valve disease. She appears euvolemic on exam. She is taking Coreg  3.125mg  once daily due to intermittent low blood pressures. Recommended low salt diet.  HLD LDL 157. Continue Zetia . She has intolerance to statins. She is not interested in injections.     Cardiac Rehabilitation Eligibility Assessment         Dispo: Follow-up in 3 months  Signed, Marquis Down VEAR Fishman, PA-C

## 2023-08-28 NOTE — Patient Instructions (Signed)
 Medication Instructions:  Your physician recommends that you continue on your current medications as directed. Please refer to the Current Medication list given to you today.    *If you need a refill on your cardiac medications before your next appointment, please call your pharmacy*  Lab Work: No labs ordered today    Testing/Procedures: No test ordered today   Follow-Up: At [facility], you and your health needs are our priority.  As part of our continuing mission to provide you with exceptional heart care, our providers are all part of one team.  This team includes your primary Cardiologist (physician) and Advanced Practice Providers or APPs (Physician Assistants and Nurse Practitioners) who all work together to provide you with the care you need, when you need it.  Your next appointment:   3 month(s)  Provider:   You may see Deatrice Cage, MD or one of the following Advanced Practice Providers on your designated Care Team:   Cadence [place], PA-C

## 2023-08-30 ENCOUNTER — Other Ambulatory Visit: Payer: Self-pay | Admitting: *Deleted

## 2023-08-30 DIAGNOSIS — Z955 Presence of coronary angioplasty implant and graft: Secondary | ICD-10-CM

## 2023-08-30 DIAGNOSIS — I214 Non-ST elevation (NSTEMI) myocardial infarction: Secondary | ICD-10-CM

## 2023-09-07 ENCOUNTER — Ambulatory Visit: Payer: Self-pay

## 2023-09-07 ENCOUNTER — Other Ambulatory Visit: Payer: Self-pay | Admitting: Internal Medicine

## 2023-09-07 ENCOUNTER — Encounter: Payer: Self-pay | Admitting: Oncology

## 2023-09-07 MED ORDER — AMOXICILLIN-POT CLAVULANATE 875-125 MG PO TABS
1.0000 | ORAL_TABLET | Freq: Two times a day (BID) | ORAL | 0 refills | Status: DC
Start: 2023-09-07 — End: 2023-09-18

## 2023-09-07 NOTE — Telephone Encounter (Signed)
 Called Patient and let her know that Dr. Marylynn called in antibiotic for her to [facility] with probiotic and clear liquid diet. Patient states she started the clear liquid diet earlier today and is going to pick up the Probiotic and Antibiotic and start them. Patient states she is aware of the symptoms that would warrant a ED visit. Patient is agreeable to Dr. Lula recommendations.

## 2023-09-07 NOTE — Telephone Encounter (Signed)
 FYI Only or Action Required?: Action required by provider: medication request.  Patient was last seen in primary care on 07/25/2023 by Marylynn Verneita CROME, MD.  Called Nurse Triage reporting Abdominal Pain.  Symptoms began yesterday.  Interventions attempted: Nothing.  Symptoms are: gradually worsening.  Triage Disposition: See HCP Within 4 Hours (Or PCP Triage)  Patient/caregiver understands and will follow disposition?: No, wishes to speak with PCP           Copied from CRM #[id number]. Topic: Clinical - Red Word Triage >> Sep 07, 2023  1:21 PM Berneda FALCON wrote: Red Word that prompted transfer to Nurse Triage: Pt would like to be seen for diverticulitis flare up. She is having left abdomen tenderness, sore, painful as of 8/21 evening. She states she has not eaten things she knows better than to eat.          Reason for Disposition  [1] MILD-MODERATE pain AND [2] constant AND [3] present > 2 hours  Answer Assessment - Initial Assessment Questions Patient was advised to go to urgent care or the ED for her symptoms. Patient declined and is requesting an antibiotic be prescribed and sent to the below pharmacy. Please advise.   [facility] [address]  [place], [place] [address]      1. LOCATION: Where does it hurt?      Left sided abdomen  2. RADIATION: Does the pain shoot anywhere else? (e.g., chest, back)     Radiates to mid-abdomen  3. ONSET: When did the pain begin? (e.g., minutes, hours or days ago)      Last night after eating spaghetti  4. SUDDEN: Gradual or sudden onset?     Sudden  5. PATTERN Does the pain come and go, or is it constant?     Constant  6. SEVERITY: How bad is the pain?  (e.g., Scale 1-10; mild, moderate, or severe)     7-8/10 7. RECURRENT SYMPTOM: Have you ever had this type of stomach pain before? If Yes, ask: When was the last time? and What happened that time?      Yes, history of diverticulosis and diverticulitis  8.  CAUSE: What do you think is causing the stomach pain? (e.g., gallstones, recent abdominal surgery)     Believes due to her diverticulitis  9. RELIEVING/AGGRAVATING FACTORS: What makes it better or worse? (e.g., antacids, bending or twisting motion, bowel movement)     Worse after getting up to do things around the house  10. OTHER SYMPTOMS: Do you have any other symptoms? (e.g., back pain, diarrhea, fever, urination pain, vomiting)       Loose stools yesterday, none today  Protocols used: Abdominal Pain - Female-A-AH

## 2023-09-11 ENCOUNTER — Other Ambulatory Visit (INDEPENDENT_AMBULATORY_CARE_PROVIDER_SITE_OTHER)

## 2023-09-11 DIAGNOSIS — K760 Fatty (change of) liver, not elsewhere classified: Secondary | ICD-10-CM | POA: Diagnosis not present

## 2023-09-11 DIAGNOSIS — E785 Hyperlipidemia, unspecified: Secondary | ICD-10-CM | POA: Diagnosis not present

## 2023-09-11 DIAGNOSIS — E1169 Type 2 diabetes mellitus with other specified complication: Secondary | ICD-10-CM | POA: Diagnosis not present

## 2023-09-11 LAB — COMPREHENSIVE METABOLIC PANEL WITH GFR
ALT: 10 U/L (ref 0–35)
AST: 14 U/L (ref 0–37)
Albumin: 4.1 g/dL (ref 3.5–5.2)
Alkaline Phosphatase: 61 U/L (ref 39–117)
BUN: 11 mg/dL (ref 6–23)
CO2: 26 meq/L (ref 19–32)
Calcium: 9.1 mg/dL (ref 8.4–10.5)
Chloride: 103 meq/L (ref 96–112)
Creatinine, Ser: 0.97 mg/dL (ref 0.40–1.20)
GFR: 55.79 mL/min — ABNORMAL LOW (ref >60.00)
Glucose, Bld: 110 mg/dL — ABNORMAL HIGH (ref 70–99)
Potassium: 4.5 meq/L (ref 3.5–5.1)
Sodium: 140 meq/L (ref 135–145)
Total Bilirubin: 1 mg/dL (ref 0.2–1.2)
Total Protein: 6.9 g/dL (ref 6.0–8.3)

## 2023-09-11 LAB — LIPID PANEL W/REFLEX DIRECT LDL
Cholesterol: 177 mg/dL (ref <200)
HDL: 46 mg/dL — ABNORMAL LOW (ref >=50)
LDL Cholesterol (Calc): 103 mg/dL — ABNORMAL HIGH
Non-HDL Cholesterol (Calc): 131 mg/dL — ABNORMAL HIGH (ref <130)
Total CHOL/HDL Ratio: 3.8 (calc) (ref <5.0)
Triglycerides: 162 mg/dL — ABNORMAL HIGH (ref <150)

## 2023-09-12 ENCOUNTER — Encounter: Attending: Cardiovascular Disease

## 2023-09-12 ENCOUNTER — Other Ambulatory Visit: Payer: Self-pay

## 2023-09-12 DIAGNOSIS — I214 Non-ST elevation (NSTEMI) myocardial infarction: Secondary | ICD-10-CM

## 2023-09-12 DIAGNOSIS — Z955 Presence of coronary angioplasty implant and graft: Secondary | ICD-10-CM

## 2023-09-12 NOTE — Progress Notes (Signed)
 Virtual Visit completed. Patient informed on EP and RD appointment and 6 Minute walk test. Patient also informed of patient health questionnaires on My Chart. Patient Verbalizes understanding. Visit diagnosis can be found in [facility] 12/11/2023.

## 2023-09-13 ENCOUNTER — Ambulatory Visit: Payer: Self-pay | Admitting: Internal Medicine

## 2023-09-13 ENCOUNTER — Other Ambulatory Visit: Payer: Self-pay | Admitting: Internal Medicine

## 2023-09-13 DIAGNOSIS — Z1231 Encounter for screening mammogram for malignant neoplasm of breast: Secondary | ICD-10-CM

## 2023-09-18 ENCOUNTER — Encounter: Payer: Self-pay | Admitting: Internal Medicine

## 2023-09-18 ENCOUNTER — Ambulatory Visit (INDEPENDENT_AMBULATORY_CARE_PROVIDER_SITE_OTHER): Admitting: Internal Medicine

## 2023-09-18 VITALS — BP 126/84 | HR 80 | Ht 66.0 in | Wt 186.4 lb

## 2023-09-18 DIAGNOSIS — E538 Deficiency of other specified B group vitamins: Secondary | ICD-10-CM

## 2023-09-18 DIAGNOSIS — E1159 Type 2 diabetes mellitus with other circulatory complications: Secondary | ICD-10-CM | POA: Diagnosis not present

## 2023-09-18 DIAGNOSIS — K760 Fatty (change of) liver, not elsewhere classified: Secondary | ICD-10-CM

## 2023-09-18 DIAGNOSIS — E1169 Type 2 diabetes mellitus with other specified complication: Secondary | ICD-10-CM | POA: Diagnosis not present

## 2023-09-18 DIAGNOSIS — E785 Hyperlipidemia, unspecified: Secondary | ICD-10-CM

## 2023-09-18 DIAGNOSIS — I152 Hypertension secondary to endocrine disorders: Secondary | ICD-10-CM

## 2023-09-18 DIAGNOSIS — Z6830 Body mass index (BMI) 30.0-30.9, adult: Secondary | ICD-10-CM

## 2023-09-18 DIAGNOSIS — E559 Vitamin D deficiency, unspecified: Secondary | ICD-10-CM | POA: Diagnosis not present

## 2023-09-18 DIAGNOSIS — I5022 Chronic systolic (congestive) heart failure: Secondary | ICD-10-CM

## 2023-09-18 LAB — POCT GLYCOSYLATED HEMOGLOBIN (HGB A1C): Hemoglobin A1C: 5.8 % — AB (ref 4.0–5.6)

## 2023-09-18 MED ORDER — EZETIMIBE 10 MG PO TABS: 10.0000 mg | ORAL_TABLET | Freq: Every day | ORAL | 3 refills | Status: AC

## 2023-09-18 NOTE — Telephone Encounter (Unsigned)
 Copied from CRM #[id number]. Topic: Clinical - Medication Question >> Sep 18, 2023 12:23 PM [place] E wrote: Reason for CRM: Patient just had an appointment today with her PCP and patient thought she had some Crestor  left, but she does not. Patient questioning if PCP can prescribe this for her as agent did not locate this medication on med list. Callback number for patient is [phone].

## 2023-09-18 NOTE — Patient Instructions (Addendum)
 Your cholesterol is too high on  Zetia  alone.  Your LDL needs to be 55 or less  (currently it is 103)   Try resuming Crestor  on an every other day basis  There is an alternative to statin therapy for management of hyperlipidemia.  It is called Repatha ( or Praluent).    And it has a very low side effect profile because of its mechanism of action.  It is in a class of drugs called a PCSK9 inhibitor.   Repatha is a monoclonal human antibody that binds to the body's natural inhibitor of the LDL receptors (PCSK9) , preventing the inhibitor from inhibiting!  This effectively increases  the number of LDL receptors available to clear the bad cholesterol (LDL)  from the bloodstream.  It is injected into the skin (at home)  twice per month . The data of its efficacy is very good.  If you would be interested in trying it,  I will try to get it authorized

## 2023-09-18 NOTE — Progress Notes (Unsigned)
 Subjective:  Patient ID: [name], female    DOB: November 15, 1945  Age: 78 y.o. MRN: [id number]  CC: The primary encounter diagnosis was Obesity, diabetes, and hypertension syndrome (HCC). Diagnoses of Vitamin D  deficiency, B12 deficiency, Fatty liver, Hyperlipidemia associated with type 2 diabetes mellitus (HCC), Morbid obesity (HCC), and Chronic HFrEF (heart failure with reduced ejection fraction) (HCC) were also pertinent to this visit.   HPI [name] presents for  Chief Complaint  Patient presents with   Medical Management of Chronic Issues    Follow up on diabetes   1) Type 2 DM:  She  feels generally well,  But is not  exercising regularly or trying to lose weight. Checking  blood sugars once daily at variable times, more often if she feels she may be having a hypoglycemic event. .  BS have been under 130 fasting and < 150 post prandially.  Denies any recent hypoglyemic events.  Taking   medications as directed. Following a carbohydrate modified diet 6 days per week. Denies numbness, burning and tingling of extremities. Appetite is good.    2) CAD:  she has been asymptomatic since her hospitalization and is starting cardiopulmonary rehab soon. . She is tolerating Zetia   but stopped rosuvastatin  due to recurrent myalgias.  Reviewed the goals of LDL reduction (55 or less);  discussed the potential use of PCSK9 inhibitiors..  She is not interested but willing to resume crestor  every other day as a trial    Outpatient Medications Prior to Visit  Medication Sig Dispense Refill   albuterol  (VENTOLIN  HFA) 108 (90 Base) MCG/ACT inhaler Inhale 1-2 puffs into the lungs every 4-6 hours as needed for cough/wheeze. 18 g 0   ALPRAZolam  (XANAX ) 0.25 MG tablet Take 1 tablet (0.25 mg total) by mouth 2 (two) times daily as needed for anxiety. 30 tablet 0   bimatoprost  (LUMIGAN ) 0.01 % SOLN Place 1 drop into both eyes at bedtime. 2.5 mL 5   budesonide  (PULMICORT ) 180 MCG/ACT inhaler Inhale 2 puffs  into the lungs 2 (two) times daily.     carvedilol  (COREG ) 3.125 MG tablet Take 1 tablet (3.125 mg total) by mouth 2 (two) times daily with a meal. 180 tablet 3   cetirizine (ZYRTEC) 10 MG tablet Take 10 mg by mouth daily.     clopidogrel  (PLAVIX ) 75 MG tablet Take 1 tablet (75 mg total) by mouth daily. 90 tablet 3   EPINEPHrine 0.3 mg/0.3 mL IJ SOAJ injection as needed.     MILK THISTLE PO Take 1 capsule by mouth daily.      nitroGLYCERIN  (NITROSTAT ) 0.4 MG SL tablet Place 1 tablet (0.4 mg total) under the tongue every 5 (five) minutes for up to 3 doses as needed for chest pain. 25 tablet 0   polyethylene glycol (MIRALAX  / GLYCOLAX ) 17 g packet Take 17 g by mouth daily as needed for mild constipation. 14 each 0   Probiotic Product (PROBIOTIC COMPLEX ACIDOPHILUS PO) Take 1 capsule by mouth daily.     sodium chloride  (OCEAN) 0.65 % SOLN nasal spray Place 1 spray into the nose as needed.     ezetimibe  (ZETIA ) 10 MG tablet Take 1 tablet (10 mg total) by mouth daily. 90 tablet 0   amoxicillin -clavulanate (AUGMENTIN ) 875-125 MG tablet Take 1 tablet by mouth 2 (two) times daily. 14 tablet 0   gabapentin  (NEURONTIN ) 100 MG capsule Take 2 capsules (200 mg total) by mouth 2 (two) times daily. Wean as directed by physician. (Patient  not taking: Reported on 09/18/2023) 49 capsule 0   methocarbamol  (ROBAXIN ) 500 MG tablet Take 0.5-1 tablets (250-500 mg total) by mouth 2 (two) times daily as needed for muscle spasms (Patient not taking: Reported on 09/12/2023) 30 tablet 5   No facility-administered medications prior to visit.    Review of Systems;  Patient denies headache, fevers, malaise, unintentional weight loss, skin rash, eye pain, sinus congestion and sinus pain, sore throat, dysphagia,  hemoptysis , cough, dyspnea, wheezing, chest pain, palpitations, orthopnea, edema, abdominal pain, nausea, melena, diarrhea, constipation, flank pain, dysuria, hematuria, urinary  Frequency, nocturia, numbness, tingling,  seizures,  Focal weakness, Loss of consciousness,  Tremor, insomnia, depression, anxiety, and suicidal ideation.      Objective:  BP 126/84   Pulse 80   Ht 5' 6 (1.676 m)   Wt 186 lb 6.4 oz (84.6 kg)   SpO2 98%   BMI 30.09 kg/m   BP Readings from Last 3 Encounters:  09/18/23 126/84  08/28/23 120/80  07/25/23 122/60    Wt Readings from Last 3 Encounters:  09/18/23 186 lb 6.4 oz (84.6 kg)  08/28/23 190 lb 9.6 oz (86.5 kg)  07/25/23 188 lb 3.2 oz (85.4 kg)    Physical Exam Vitals reviewed.  Constitutional:      General: She is not in acute distress.    Appearance: Normal appearance. She is normal weight. She is not ill-appearing, toxic-appearing or diaphoretic.  HENT:     Head: Normocephalic.  Eyes:     General: No scleral icterus.       Right eye: No discharge.        Left eye: No discharge.     Conjunctiva/sclera: Conjunctivae normal.  Cardiovascular:     Rate and Rhythm: Normal rate and regular rhythm.     Heart sounds: Normal heart sounds.  Pulmonary:     Effort: Pulmonary effort is normal. No respiratory distress.     Breath sounds: Normal breath sounds.  Musculoskeletal:        General: Normal range of motion.  Skin:    General: Skin is warm and dry.  Neurological:     General: No focal deficit present.     Mental Status: She is alert and oriented to person, place, and time. Mental status is at baseline.  Psychiatric:        Mood and Affect: Mood normal.        Behavior: Behavior normal.        Thought Content: Thought content normal.        Judgment: Judgment normal.     Lab Results  Component Value Date   HGBA1C 5.8 (A) 09/18/2023   HGBA1C 6.2 06/14/2023   HGBA1C 6.2 (H) 02/09/2023    Lab Results  Component Value Date   CREATININE 0.97 09/11/2023   CREATININE 0.92 07/23/2023   CREATININE 0.96 06/14/2023    Lab Results  Component Value Date   WBC 7.2 03/05/2023   HGB 13.3 03/05/2023   HCT 39.9 03/05/2023   PLT 315.0 03/05/2023    GLUCOSE 110 (H) 09/11/2023   CHOL 177 09/11/2023   TRIG 162 (H) 09/11/2023   HDL 46 (L) 09/11/2023   LDLDIRECT 157.0 06/14/2023   LDLCALC 103 (H) 09/11/2023   ALT 10 09/11/2023   AST 14 09/11/2023   NA 140 09/11/2023   K 4.5 09/11/2023   CL 103 09/11/2023   CREATININE 0.97 09/11/2023   BUN 11 09/11/2023   CO2 26 09/11/2023   TSH 3.43 11/13/2022  INR 1.1 11/17/2022   HGBA1C 5.8 (A) 09/18/2023   MICROALBUR <0.7 06/14/2023    MR LUMBAR SPINE WO CONTRAST Result Date: 02/28/2023 CLINICAL DATA:  Low back pain radiating to the left hip groin, and thigh to the knee since mid November. No injury or prior surgery. EXAM: MRI LUMBAR SPINE WITHOUT CONTRAST TECHNIQUE: Multiplanar, multisequence MR imaging of the lumbar spine was performed. No intravenous contrast was administered. COMPARISON:  CT abdomen pelvis dated February 08, 2023. FINDINGS: Segmentation:  Standard. Alignment:  Physiologic. Vertebrae:  No fracture, evidence of discitis, or bone lesion. Conus medullaris and cauda equina: Conus extends to the L1-L2 level. Conus and cauda equina appear normal. Paraspinal and other soft tissues: Negative. Disc levels: T12-L1: Minimal leftward disc bulging and endplate spurring. No stenosis. L1-L2:  Mild disc bulging.  No stenosis. L2-L3:  Negative. L3-L4: Mild disc bulging with superimposed large left foraminal disc protrusion with impingement of the exiting left L3 nerve root. Mild bilateral facet arthropathy. Mild-to-moderate left neuroforaminal stenosis. No spinal canal or right neuroforaminal stenosis. L4-L5: Mild disc bulging encroaching on the exited bilateral L4 nerve roots without displacement. Mild bilateral facet arthropathy. Mild right lateral recess stenosis. Mild left-greater-than-right neuroforaminal stenosis. No spinal canal stenosis. L5-S1: Negative disc. Moderate bilateral facet arthropathy. No stenosis. IMPRESSION: 1. Large left foraminal disc protrusion at L3-L4 with impingement of the  exiting left L3 nerve root. Electronically Signed   By: Elsie ONEIDA Shoulder M.D.   On: 02/28/2023 19:16    Assessment & Plan:  .Obesity, diabetes, and hypertension syndrome (HCC) Assessment & Plan: Well controlled without  metformin   SGLT 2 inhibitor(which she has declined)   and GLP 1 agonist (also declined).  Statin myalgia also reported.  .  Continue telmisartan  , Zetia ,  and repeat trial of every other day Crestor      Lab Results  Component Value Date   HGBA1C 5.8 (A) 09/18/2023   Lab Results  Component Value Date   MICROALBUR <0.7 06/14/2023       Orders: -     POCT glycosylated hemoglobin (Hb A1C) -     Hemoglobin A1c; Future -     Comprehensive metabolic panel with GFR; Future  Vitamin D  deficiency -     VITAMIN D  25 Hydroxy (Vit-D Deficiency, Fractures); Future  B12 deficiency Assessment & Plan: IF AB IS NEGATIVE. Advised to resume  oral supplements  Lab Results  Component Value Date   VITAMINB12 262 03/09/2021     Orders: -     B12 and Folate Panel; Future  Fatty liver Assessment & Plan:  hepatic enzymes are normal.  She has lost  weight despite her orthopedic issues.  Lab Results  Component Value Date   ALT 10 09/11/2023   AST 14 09/11/2023   ALKPHOS 61 09/11/2023   BILITOT 1.0 09/11/2023      Hyperlipidemia associated with type 2 diabetes mellitus (HCC) Assessment & Plan: She is tolerating Zetia  after a week or two of loose stools.  LFTS normal.   She is not at goal.  She has a history of statin induced muscle weakness (subjective) .  She has rejected offer of PCSK9 inhiibitor  but is willing to retry Crestor  at every other day dosing  to lower her LDL .   Orders: -     Lipid panel; Future -     LDL cholesterol, direct; Future  Morbid obesity (HCC) Assessment & Plan: Complicated by diabets, hypertension . She has lost 18 lbs since Jan 2025 intentionally through  dietary restriction .  She has deferred pharmacotherapy    Chronic HFrEF (heart  failure with reduced ejection fraction) [facility]) Assessment & Plan: Secondary to NSTEMI in Nov 2024.   Repeat ECHO in Jan 2025 showed  improvement in pump function to  LVEF 45-50% (mildly reduced), impaired relaxation, mild valve disease Advised to continue sodium restriction. She has accepted referral to  cardiopulmonary PT  now that she has  completed PT for back and hip pain . [name]   Other orders -     Ezetimibe ; Take 1 tablet (10 mg total) by mouth daily.  Dispense: 90 tablet; Refill: 3     Follow-up: Return in about 3 months (around 12/18/2023) for follow up diabetes.   Verneita LITTIE Kettering, MD

## 2023-09-19 ENCOUNTER — Ambulatory Visit

## 2023-09-19 MED ORDER — ROSUVASTATIN CALCIUM 5 MG PO TABS: 5.0000 mg | ORAL_TABLET | Freq: Every day | ORAL | 3 refills | Status: AC

## 2023-09-20 ENCOUNTER — Encounter: Payer: Self-pay | Admitting: Internal Medicine

## 2023-09-20 ENCOUNTER — Ambulatory Visit: Payer: Self-pay | Admitting: Internal Medicine

## 2023-09-20 NOTE — Assessment & Plan Note (Addendum)
 Secondary to NSTEMI in Nov 2024.   Repeat ECHO in Jan 2025 showed  improvement in pump function to  LVEF 45-50% (mildly reduced), impaired relaxation, mild valve disease Advised to continue sodium restriction. She has accepted referral to  cardiopulmonary PT  now that she has  completed PT for back and hip pain . [name]

## 2023-09-20 NOTE — Assessment & Plan Note (Signed)
 Well controlled without  metformin   SGLT 2 inhibitor(which she has declined)   and GLP 1 agonist (also declined).  Statin myalgia also reported.  .  Continue telmisartan  , Zetia ,  and repeat trial of every other day Crestor      Lab Results  Component Value Date   HGBA1C 5.8 (A) 09/18/2023   Lab Results  Component Value Date   MICROALBUR <0.7 06/14/2023

## 2023-09-20 NOTE — Assessment & Plan Note (Signed)
 hepatic enzymes are normal.  She has lost  weight despite her orthopedic issues.  Lab Results  Component Value Date   ALT 10 09/11/2023   AST 14 09/11/2023   ALKPHOS 61 09/11/2023   BILITOT 1.0 09/11/2023

## 2023-09-20 NOTE — Assessment & Plan Note (Signed)
 She is tolerating Zetia  after a week or two of loose stools.  LFTS normal.   She is not at goal.  She has a history of statin induced muscle weakness (subjective) .  She has rejected offer of PCSK9 inhiibitor  but is willing to retry Crestor  at every other day dosing  to lower her LDL .

## 2023-09-20 NOTE — Assessment & Plan Note (Signed)
 IF AB IS NEGATIVE. Advised to resume  oral supplements  Lab Results  Component Value Date   VITAMINB12 262 03/09/2021

## 2023-09-20 NOTE — Assessment & Plan Note (Addendum)
 Complicated by diabets, hypertension . She has lost 18 lbs since Jan 2025 intentionally through dietary restriction .  She has deferred pharmacotherapy

## 2023-10-10 ENCOUNTER — Ambulatory Visit
Admission: RE | Admit: 2023-10-10 | Discharge: 2023-10-10 | Disposition: A | Discharge status: Home / Self Care | Source: Ambulatory Visit | Attending: Internal Medicine | Admitting: Internal Medicine

## 2023-10-10 DIAGNOSIS — Z1231 Encounter for screening mammogram for malignant neoplasm of breast: Secondary | ICD-10-CM

## 2023-10-15 ENCOUNTER — Encounter: Attending: Cardiovascular Disease

## 2023-10-15 VITALS — Ht 66.6 in | Wt 192.5 lb

## 2023-10-15 DIAGNOSIS — I214 Non-ST elevation (NSTEMI) myocardial infarction: Secondary | ICD-10-CM | POA: Diagnosis present

## 2023-10-15 DIAGNOSIS — Z955 Presence of coronary angioplasty implant and graft: Secondary | ICD-10-CM | POA: Diagnosis present

## 2023-10-15 NOTE — Patient Instructions (Signed)
 Patient Instructions  Patient Details  Name: [name] MRN: [id number] Date of Birth: [birth year] Referring Provider:  Darron Deatrice LABOR, MD  Below are your personal goals for exercise, nutrition, and risk factors. Our goal is to help you stay on track towards obtaining and maintaining these goals. We will be discussing your progress on these goals with you throughout the program.  Initial Exercise Prescription:  Initial Exercise Prescription - 10/15/23 1200       Date of Initial Exercise RX and Referring Provider   Date 10/15/23    Referring Provider Darron Deatrice, MD      Oxygen   Maintain Oxygen Saturation 88% or higher      Recumbant Bike   Level 1    RPM 5    Watts 15    Minutes 15    METs 2.02      NuStep   Level 1    SPM 80    Minutes 15    METs 2.02      Arm Ergometer   Level 1    RPM 25    Minutes 15    METs 2.02      Biostep-RELP   Level 1    SPM 50    Minutes 15    METs 2.02      Track   Laps 18    Minutes 15    METs 1.98      Prescription Details   Frequency (times per week) 2    Duration Progress to 30 minutes of continuous aerobic without signs/symptoms of physical distress      Intensity   THRR 40-80% of Max Heartrate 97-126    Ratings of Perceived Exertion 11-13    Perceived Dyspnea 0-4      Progression   Progression Continue to progress workloads to maintain intensity without signs/symptoms of physical distress.      Resistance Training   Training Prescription Yes    Weight 2 lb    Reps 10-15          Exercise Goals: Frequency: Be able to perform aerobic exercise two to three times per week in program working toward 2-5 days per week of home exercise.  Intensity: Work with a perceived exertion of 11 (fairly light) - 15 (hard) while following your exercise prescription.  We will make changes to your prescription with you as you progress through the program.   Duration: Be able to do 30 to 45 minutes of continuous aerobic  exercise in addition to a 5 minute warm-up and a 5 minute cool-down routine.   Nutrition Goals: Your personal nutrition goals will be established when you do your nutrition analysis with the dietician.  The following are general nutrition guidelines to follow: Cholesterol < 200mg /day Sodium < 1500mg /day Fiber: Women over 50 yrs - 21 grams per day  Personal Goals:  Personal Goals and Risk Factors at Admission - 10/15/23 1207       Core Components/Risk Factors/Patient Goals on Admission    Weight Management Yes;Weight Loss    Intervention Weight Management: Develop a combined nutrition and exercise program designed to reach desired caloric intake, while maintaining appropriate intake of nutrient and fiber, sodium and fats, and appropriate energy expenditure required for the weight goal.;Weight Management: Provide education and appropriate resources to help participant work on and attain dietary goals.;Weight Management/Obesity: Establish reasonable short term and long term weight goals.    Admit Weight 192 lb 8 oz (87.3 kg)    Goal  Weight: Short Term 180 lb (81.6 kg)    Goal Weight: Long Term 160 lb (72.6 kg)    Expected Outcomes Short Term: Continue to assess and modify interventions until short term weight is achieved;Long Term: Adherence to nutrition and physical activity/exercise program aimed toward attainment of established weight goal;Weight Loss: Understanding of general recommendations for a balanced deficit meal plan, which promotes 1-2 lb weight loss per week and includes a negative energy balance of [phone] kcal/d;Understanding recommendations for meals to include 15-35% energy as protein, 25-35% energy from fat, 35-60% energy from carbohydrates, less than 200mg  of dietary cholesterol, 20-35 gm of total fiber daily;Understanding of distribution of calorie intake throughout the day with the consumption of 4-5 meals/snacks    Diabetes Yes   Diet controlled   Intervention Provide  education about signs/symptoms and action to take for hypo/hyperglycemia.;Provide education about proper nutrition, including hydration, and aerobic/resistive exercise prescription along with prescribed medications to achieve blood glucose in normal ranges: Fasting glucose 65-99 mg/dL    Expected Outcomes Short Term: Participant verbalizes understanding of the signs/symptoms and immediate care of hyper/hypoglycemia, proper foot care and importance of medication, aerobic/resistive exercise and nutrition plan for blood glucose control.;Long Term: Attainment of HbA1C < 7%.    Hypertension Yes    Intervention Provide education on lifestyle modifcations including regular physical activity/exercise, weight management, moderate sodium restriction and increased consumption of fresh fruit, vegetables, and low fat dairy, alcohol moderation, and smoking cessation.;Monitor prescription use compliance.    Expected Outcomes Short Term: Continued assessment and intervention until BP is < 140/88mm HG in hypertensive participants. < 130/11mm HG in hypertensive participants with diabetes, heart failure or chronic kidney disease.;Long Term: Maintenance of blood pressure at goal levels.    Lipids Yes    Intervention Provide education and support for participant on nutrition & aerobic/resistive exercise along with prescribed medications to achieve LDL 70mg , HDL >40mg .    Expected Outcomes Short Term: Participant states understanding of desired cholesterol values and is compliant with medications prescribed. Participant is following exercise prescription and nutrition guidelines.;Long Term: Cholesterol controlled with medications as prescribed, with individualized exercise RX and with personalized nutrition plan. Value goals: LDL < 70mg , HDL > 40 mg.          Tobacco Use Initial Evaluation: Social History   Tobacco Use  Smoking Status Former   Current packs/day: 0.00   Types: Cigarettes   Quit date: 01/16/1978   Years  since quitting: 45.7  Smokeless Tobacco Never  Tobacco Comments   REMOTELY QUIT IN 1987 AFTER A FEW YEARS OF USE    Exercise Goals and Review:  Exercise Goals     Row Name 10/15/23 1203             Exercise Goals   Increase Physical Activity Yes       Intervention Provide advice, education, support and counseling about physical activity/exercise needs.;Develop an individualized exercise prescription for aerobic and resistive training based on initial evaluation findings, risk stratification, comorbidities and participant's personal goals.       Expected Outcomes Short Term: Attend rehab on a regular basis to increase amount of physical activity.;Long Term: Add in home exercise to make exercise part of routine and to increase amount of physical activity.;Long Term: Exercising regularly at least 3-5 days a week.       Increase Strength and Stamina Yes       Intervention Provide advice, education, support and counseling about physical activity/exercise needs.;Develop an individualized exercise prescription for aerobic  and resistive training based on initial evaluation findings, risk stratification, comorbidities and participant's personal goals.       Expected Outcomes Short Term: Increase workloads from initial exercise prescription for resistance, speed, and METs.;Short Term: Perform resistance training exercises routinely during rehab and add in resistance training at home;Long Term: Improve cardiorespiratory fitness, muscular endurance and strength as measured by increased METs and functional capacity ( )       Able to understand and use rate of perceived exertion (RPE) scale Yes       Intervention Provide education and explanation on how to use RPE scale       Expected Outcomes Short Term: Able to use RPE daily in rehab to express subjective intensity level;Long Term:  Able to use RPE to guide intensity level when exercising independently       Able to understand and use Dyspnea scale  Yes       Intervention Provide education and explanation on how to use Dyspnea scale       Expected Outcomes Short Term: Able to use Dyspnea scale daily in rehab to express subjective sense of shortness of breath during exertion;Long Term: Able to use Dyspnea scale to guide intensity level when exercising independently       Knowledge and understanding of Target Heart Rate Range (THRR) Yes       Intervention Provide education and explanation of THRR including how the numbers were predicted and where they are located for reference       Expected Outcomes Short Term: Able to state/look up THRR;Short Term: Able to use daily as guideline for intensity in rehab;Long Term: Able to use THRR to govern intensity when exercising independently       Able to check pulse independently Yes       Intervention Provide education and demonstration on how to check pulse in carotid and radial arteries.;Review the importance of being able to check your own pulse for safety during independent exercise       Expected Outcomes Short Term: Able to explain why pulse checking is important during independent exercise;Long Term: Able to check pulse independently and accurately       Understanding of Exercise Prescription Yes       Intervention Provide education, explanation, and written materials on patient's individual exercise prescription       Expected Outcomes Short Term: Able to explain program exercise prescription;Long Term: Able to explain home exercise prescription to exercise independently

## 2023-10-15 NOTE — Progress Notes (Signed)
 Cardiac Individual Treatment Plan  Patient Details  Name: [name] MRN: [id number] Date of Birth: 06/30/1945 Referring Provider:   Flowsheet Row Cardiac Rehab from 10/15/2023 in [facility] Cardiac and Pulmonary Rehab  Referring Provider Darron Grass, MD    Initial Encounter Date:  Flowsheet Row Cardiac Rehab from 10/15/2023 in [facility] Cardiac and Pulmonary Rehab  Date 10/15/23    Visit Diagnosis: NSTEMI (non-ST elevation myocardial infarction) [facility])  Status post coronary artery stent placement  Patient's Home Medications on Admission:  Current Outpatient Medications:    albuterol  (VENTOLIN  HFA) 108 (90 Base) MCG/ACT inhaler, Inhale 1-2 puffs into the lungs every 4-6 hours as needed for cough/wheeze., Disp: 18 g, Rfl: 0   ALPRAZolam  (XANAX ) 0.25 MG tablet, Take 1 tablet (0.25 mg total) by mouth 2 (two) times daily as needed for anxiety., Disp: 30 tablet, Rfl: 0   bimatoprost  (LUMIGAN ) 0.01 % SOLN, Place 1 drop into both eyes at bedtime., Disp: 2.5 mL, Rfl: 5   budesonide  (PULMICORT ) 180 MCG/ACT inhaler, Inhale 2 puffs into the lungs 2 (two) times daily., Disp: , Rfl:    carvedilol  (COREG ) 3.125 MG tablet, Take 1 tablet (3.125 mg total) by mouth 2 (two) times daily with a meal., Disp: 180 tablet, Rfl: 3   cetirizine (ZYRTEC) 10 MG tablet, Take 10 mg by mouth daily., Disp: , Rfl:    clopidogrel  (PLAVIX ) 75 MG tablet, Take 1 tablet (75 mg total) by mouth daily., Disp: 90 tablet, Rfl: 3   EPINEPHrine 0.3 mg/0.3 mL IJ SOAJ injection, as needed., Disp: , Rfl:    ezetimibe  (ZETIA ) 10 MG tablet, Take 1 tablet (10 mg total) by mouth daily., Disp: 90 tablet, Rfl: 3   MILK THISTLE PO, Take 1 capsule by mouth daily. , Disp: , Rfl:    nitroGLYCERIN  (NITROSTAT ) 0.4 MG SL tablet, Place 1 tablet (0.4 mg total) under the tongue every 5 (five) minutes for up to 3 doses as needed for chest pain., Disp: 25 tablet, Rfl: 0   polyethylene glycol (MIRALAX  / GLYCOLAX ) 17 g packet, Take 17 g by mouth daily as  needed for mild constipation., Disp: 14 each, Rfl: 0   Probiotic Product (PROBIOTIC COMPLEX ACIDOPHILUS PO), Take 1 capsule by mouth daily., Disp: , Rfl:    rosuvastatin  (CRESTOR ) 5 MG tablet, Take 1 tablet (5 mg total) by mouth daily., Disp: 90 tablet, Rfl: 3   sodium chloride  (OCEAN) 0.65 % SOLN nasal spray, Place 1 spray into the nose as needed., Disp: , Rfl:   Past Medical History: Past Medical History:  Diagnosis Date   Acute GI bleeding 02/08/2023   Angina pectoris 04/14/2015   Asthma    Asthma due to environmental allergies    grass, mold, trees, dust   Cataract    Cellulitis    LEFT LEG   Diabetes mellitus    CONTROLLED ON DIET ALONE   Diverticulitis of colon 02/09/2023   Diverticulitis of intestine with bleeding 02/08/2023   Edema leg    LEFT LEG...CHRONIC   Endometrial hyperplasia    External hemorrhoid    Fatty liver    Glaucoma    Hepatic cyst    STABLE PER 05/20/2008 ULTRASOUND   Hypertension    borderline...controlled since taking herself of HCTZ IN JULY   IBS (irritable bowel syndrome)    Lactic acidosis 02/09/2023   Murmur, cardiac    Neuropathy    Numbness of fingers of both hands 04/17/2015   OSA on CPAP    Osteoarthritis  Psoriasis    Sleep apnea    CPAP    Tobacco Use: Social History   Tobacco Use  Smoking Status Former   Current packs/day: 0.00   Types: Cigarettes   Quit date: 01/16/1978   Years since quitting: 45.7  Smokeless Tobacco Never  Tobacco Comments   REMOTELY QUIT IN 1987 AFTER A FEW YEARS OF USE    Labs: Review Flowsheet  More data exists      Latest Ref Rng & Units 11/13/2022 02/09/2023 06/14/2023 09/11/2023 09/18/2023  Labs for ITP Cardiac and Pulmonary Rehab  Cholestrol <200 mg/dL 810  - 780  822  -  LDL (calc) mg/dL (calc) 891  - 860  896  -  Direct LDL mg/dL 872.9  - 842.9  - -  HDL-C > OR = 50 mg/dL 48.59  - 49.79  46  -  Trlycerides <150 mg/dL 849.9  - 848.9  837  -  Hemoglobin A1c 4.0 - 5.6 % 7.1  6.2  6.2  - 5.8       Exercise Target Goals: Exercise Program Goal: Individual exercise prescription set using results from initial 6 min walk test and THRR while considering  patient's activity barriers and safety.   Exercise Prescription Goal: Initial exercise prescription builds to 30-45 minutes a day of aerobic activity, 2-3 days per week.  Home exercise guidelines will be given to patient during program as part of exercise prescription that the participant will acknowledge.   Education: Aerobic Exercise: - Group verbal and visual presentation on the components of exercise prescription. Introduces F.I.T.T principle from ACSM for exercise prescriptions.  Reviews F.I.T.T. principles of aerobic exercise including progression. Written material provided at class time. Flowsheet Row Cardiac Rehab from 10/15/2023 in [facility] Cardiac and Pulmonary Rehab  Education need identified 10/15/23    Education: Resistance Exercise: - Group verbal and visual presentation on the components of exercise prescription. Introduces F.I.T.T principle from ACSM for exercise prescriptions  Reviews F.I.T.T. principles of resistance exercise including progression. Written material provided at class time.    Education: Exercise & Equipment Safety: - Individual verbal instruction and demonstration of equipment use and safety with use of the equipment. Flowsheet Row Cardiac Rehab from 10/15/2023 in [facility] Cardiac and Pulmonary Rehab  Date 10/15/23  Educator MB  Instruction Review Code 1- Verbalizes Understanding    Education: Exercise Physiology & General Exercise Guidelines: - Group verbal and written instruction with models to review the exercise physiology of the cardiovascular system and associated critical values. Provides general exercise guidelines with specific guidelines to those with heart or lung disease. Written material provided at class time.   Education: Flexibility, Balance, Mind/Body Relaxation: - Group verbal and  visual presentation with interactive activity on the components of exercise prescription. Introduces F.I.T.T principle from ACSM for exercise prescriptions. Reviews F.I.T.T. principles of flexibility and balance exercise training including progression. Also discusses the mind body connection.  Reviews various relaxation techniques to help reduce and manage stress (i.e. Deep breathing, progressive muscle relaxation, and visualization). Balance handout provided to take home. Written material provided at class time.   Activity Barriers & Risk Stratification:  Activity Barriers & Cardiac Risk Stratification - 10/15/23 1158       Activity Barriers & Cardiac Risk Stratification   Activity Barriers Arthritis;Other (comment);Deconditioning    Comments Neuropathy    Cardiac Risk Stratification Moderate          6 Minute Walk:  6 Minute Walk     Row Name 10/15/23 1157  6 Minute Walk   Phase Initial     Distance 1070 feet     Walk Time 6 minutes     # of Rest Breaks 0     MPH 2.03     METS 2.02     RPE 15     Perceived Dyspnea  2     VO2 Peak 7.06     Symptoms Yes (comment)     Comments SOB     Resting HR 68 bpm     Resting BP 116/74     Resting Oxygen Saturation  97 %     Exercise Oxygen Saturation  during 6 min walk 100 %     Max Ex. HR 109 bpm     Max Ex. BP 144/70     2 Minute Post BP 110/64        Oxygen Initial Assessment:   Oxygen Re-Evaluation:   Oxygen Discharge (Final Oxygen Re-Evaluation):   Initial Exercise Prescription:  Initial Exercise Prescription - 10/15/23 1200       Date of Initial Exercise RX and Referring Provider   Date 10/15/23    Referring Provider Darron Grass, MD      Oxygen   Maintain Oxygen Saturation 88% or higher      Recumbant Bike   Level 1    RPM 5    Watts 15    Minutes 15    METs 2.02      NuStep   Level 1    SPM 80    Minutes 15    METs 2.02      Arm Ergometer   Level 1    RPM 25    Minutes 15    METs  2.02      Biostep-RELP   Level 1    SPM 50    Minutes 15    METs 2.02      Track   Laps 18    Minutes 15    METs 1.98      Prescription Details   Frequency (times per week) 2    Duration Progress to 30 minutes of continuous aerobic without signs/symptoms of physical distress      Intensity   THRR 40-80% of Max Heartrate 97-126    Ratings of Perceived Exertion 11-13    Perceived Dyspnea 0-4      Progression   Progression Continue to progress workloads to maintain intensity without signs/symptoms of physical distress.      Resistance Training   Training Prescription Yes    Weight 2 lb    Reps 10-15          Perform Capillary Blood Glucose checks as needed.  Exercise Prescription Changes:   Exercise Prescription Changes     Row Name 10/15/23 1200             Response to Exercise   Blood Pressure (Admit) 116/74       Blood Pressure (Exercise) 144/70       Blood Pressure (Exit) 110/64       Heart Rate (Admit) 68 bpm       Heart Rate (Exercise) 109 bpm       Heart Rate (Exit) 77 bpm       Oxygen Saturation (Admit) 97 %       Oxygen Saturation (Exercise) 100 %       Oxygen Saturation (Exit) 97 %       Rating of Perceived Exertion (Exercise) 15  Perceived Dyspnea (Exercise) 2       Symptoms SOB       Comments results         Progression   Average METs 2.02          Exercise Comments:   Exercise Goals and Review:   Exercise Goals     Row Name 10/15/23 1203             Exercise Goals   Increase Physical Activity Yes       Intervention Provide advice, education, support and counseling about physical activity/exercise needs.;Develop an individualized exercise prescription for aerobic and resistive training based on initial evaluation findings, risk stratification, comorbidities and participant's personal goals.       Expected Outcomes Short Term: Attend rehab on a regular basis to increase amount of physical activity.;Long Term: Add in  home exercise to make exercise part of routine and to increase amount of physical activity.;Long Term: Exercising regularly at least 3-5 days a week.       Increase Strength and Stamina Yes       Intervention Provide advice, education, support and counseling about physical activity/exercise needs.;Develop an individualized exercise prescription for aerobic and resistive training based on initial evaluation findings, risk stratification, comorbidities and participant's personal goals.       Expected Outcomes Short Term: Increase workloads from initial exercise prescription for resistance, speed, and METs.;Short Term: Perform resistance training exercises routinely during rehab and add in resistance training at home;Long Term: Improve cardiorespiratory fitness, muscular endurance and strength as measured by increased METs and functional capacity ( )       Able to understand and use rate of perceived exertion (RPE) scale Yes       Intervention Provide education and explanation on how to use RPE scale       Expected Outcomes Short Term: Able to use RPE daily in rehab to express subjective intensity level;Long Term:  Able to use RPE to guide intensity level when exercising independently       Able to understand and use Dyspnea scale Yes       Intervention Provide education and explanation on how to use Dyspnea scale       Expected Outcomes Short Term: Able to use Dyspnea scale daily in rehab to express subjective sense of shortness of breath during exertion;Long Term: Able to use Dyspnea scale to guide intensity level when exercising independently       Knowledge and understanding of Target Heart Rate Range (THRR) Yes       Intervention Provide education and explanation of THRR including how the numbers were predicted and where they are located for reference       Expected Outcomes Short Term: Able to state/look up THRR;Short Term: Able to use daily as guideline for intensity in rehab;Long Term: Able to  use THRR to govern intensity when exercising independently       Able to check pulse independently Yes       Intervention Provide education and demonstration on how to check pulse in carotid and radial arteries.;Review the importance of being able to check your own pulse for safety during independent exercise       Expected Outcomes Short Term: Able to explain why pulse checking is important during independent exercise;Long Term: Able to check pulse independently and accurately       Understanding of Exercise Prescription Yes       Intervention Provide education, explanation, and written materials on patient's individual  exercise prescription       Expected Outcomes Short Term: Able to explain program exercise prescription;Long Term: Able to explain home exercise prescription to exercise independently          Exercise Goals Re-Evaluation :   Discharge Exercise Prescription (Final Exercise Prescription Changes):  Exercise Prescription Changes - 10/15/23 1200       Response to Exercise   Blood Pressure (Admit) 116/74    Blood Pressure (Exercise) 144/70    Blood Pressure (Exit) 110/64    Heart Rate (Admit) 68 bpm    Heart Rate (Exercise) 109 bpm    Heart Rate (Exit) 77 bpm    Oxygen Saturation (Admit) 97 %    Oxygen Saturation (Exercise) 100 %    Oxygen Saturation (Exit) 97 %    Rating of Perceived Exertion (Exercise) 15    Perceived Dyspnea (Exercise) 2    Symptoms SOB    Comments results      Progression   Average METs 2.02          Nutrition:  Target Goals: Understanding of nutrition guidelines, daily intake of sodium 1500mg , cholesterol 200mg , calories 30% from fat and 7% or less from saturated fats, daily to have 5 or more servings of fruits and vegetables.  Education: Nutrition 1 -Group instruction provided by verbal, written material, interactive activities, discussions, models, and posters to present general guidelines for heart healthy nutrition including  macronutrients, label reading, and promoting whole foods over processed counterparts. Education serves as Pensions consultant of discussion of heart healthy eating for all. Written material provided at class time.    Education: Nutrition 2 -Group instruction provided by verbal, written material, interactive activities, discussions, models, and posters to present general guidelines for heart healthy nutrition including sodium, cholesterol, and saturated fat. Providing guidance of habit forming to improve blood pressure, cholesterol, and body weight. Written material provided at class time.     Biometrics:  Pre Biometrics - 10/15/23 1204       Pre Biometrics   Height 5' 6.6 (1.692 m)    Weight 192 lb 8 oz (87.3 kg)    Waist Circumference 40.5 inches    Hip Circumference 47 inches    Waist to Hip Ratio 0.86 %    BMI (Calculated) 30.5    Single Leg Stand 3.2 seconds           Nutrition Therapy Plan and Nutrition Goals:  Nutrition Therapy & Goals - 10/15/23 1206       Personal Nutrition Goals   Nutrition Goal Waitning a couple weeks to schedule with RD.      Intervention Plan   Intervention Prescribe, educate and counsel regarding individualized specific dietary modifications aiming towards targeted core components such as weight, hypertension, lipid management, diabetes, heart failure and other comorbidities.    Expected Outcomes Short Term Goal: Understand basic principles of dietary content, such as calories, fat, sodium, cholesterol and nutrients.          Nutrition Assessments:  MEDIFICTS Score Key: >=70 Need to make dietary changes  40-70 Heart Healthy Diet <= 40 Therapeutic Level Cholesterol Diet  Flowsheet Row Cardiac Rehab from 10/15/2023 in [facility] Cardiac and Pulmonary Rehab  Picture Your Plate Total Score on Admission 70   Picture Your Plate Scores: <59 Unhealthy dietary pattern with much room for improvement. 41-50 Dietary pattern unlikely to meet recommendations  for good health and room for improvement. 51-60 More healthful dietary pattern, with some room for improvement.  >60 Healthy dietary pattern,  although there may be some specific behaviors that could be improved.    Nutrition Goals Re-Evaluation:   Nutrition Goals Discharge (Final Nutrition Goals Re-Evaluation):   Psychosocial: Target Goals: Acknowledge presence or absence of significant depression and/or stress, maximize coping skills, provide positive support system. Participant is able to verbalize types and ability to use techniques and skills needed for reducing stress and depression.   Education: Stress, Anxiety, and Depression - Group verbal and visual presentation to define topics covered.  Reviews how body is impacted by stress, anxiety, and depression.  Also discusses healthy ways to reduce stress and to treat/manage anxiety and depression. Written material provided at class time.   Education: Sleep Hygiene -Provides group verbal and written instruction about how sleep can affect your health.  Define sleep hygiene, discuss sleep cycles and impact of sleep habits. Review good sleep hygiene tips.   Initial Review & Psychosocial Screening:  Initial Psych Review & Screening - 09/12/23 1453       Initial Review   Current issues with Current Stress Concerns    Source of Stress Concerns Chronic Illness    Comments Dorotha gets down since her health event and her husband is picking up the slack.      Family Dynamics   Good Support System? Yes    Comments She can look to her husband, daughters and neighbors for support.      Barriers   Psychosocial barriers to participate in program The patient should benefit from training in stress management and relaxation.      Screening Interventions   Interventions Encouraged to exercise;Provide feedback about the scores to participant;To provide support and resources with identified psychosocial needs    Expected Outcomes Short Term goal:  Utilizing psychosocial counselor, staff and physician to assist with identification of specific Stressors or current issues interfering with healing process. Setting desired goal for each stressor or current issue identified.;Long Term Goal: Stressors or current issues are controlled or eliminated.;Short Term goal: Identification and review with participant of any Quality of Life or Depression concerns found by scoring the questionnaire.;Long Term goal: The participant improves quality of Life and PHQ9 Scores as seen by post scores and/or verbalization of changes          Quality of Life Scores:   Quality of Life - 10/15/23 1205       Quality of Life   Select Quality of Life      Quality of Life Scores   Health/Function Pre 12.86 %    Socioeconomic Pre 30 %    Psych/Spiritual Pre 18.86 %    Family Pre 26.4 %    GLOBAL Pre 19.5 %         Scores of 19 and below usually indicate a poorer quality of life in these areas.  A difference of  2-3 points is a clinically meaningful difference.  A difference of 2-3 points in the total score of the Quality of Life Index has been associated with significant improvement in overall quality of life, self-image, physical symptoms, and general health in studies assessing change in quality of life.  PHQ-9: Review Flowsheet  More data exists      10/15/2023 09/18/2023 07/25/2023 06/14/2023 01/11/2023  Depression screen PHQ 2/9  Decreased Interest 0 1 0 0 0  Down, Depressed, Hopeless 1 1 0 1 0  PHQ - 2 Score 1 2 0 1 0  Altered sleeping 0 0 - - 0  Tired, decreased energy 3 1 - - 0  Change in appetite 0 0 - - 0  Feeling bad or failure about yourself  1 1 - - 0  Trouble concentrating 0 0 - - 0  Moving slowly or fidgety/restless 0 0 - - 0  Suicidal thoughts 0 0 - - 0  PHQ-9 Score 5 4 - - 0  Difficult doing work/chores Somewhat difficult Somewhat difficult - - Not difficult at all   Interpretation of Total Score  Total Score Depression Severity:  1-4 =  Minimal depression, 5-9 = Mild depression, 10-14 = Moderate depression, 15-19 = Moderately severe depression, 20-27 = Severe depression   Psychosocial Evaluation and Intervention:  Psychosocial Evaluation - 09/12/23 1455       Psychosocial Evaluation & Interventions   Interventions Encouraged to exercise with the program and follow exercise prescription;Relaxation education;Stress management education    Comments She can look to her husband, daughters and neighbors for support.Cambell gets down since her health event and her husband is picking up the slack.    Expected Outcomes Short: Start Heart[facility] to help with mood. Long: Maintain a healthy mental state    Continue Psychosocial Services  Follow up required by staff          Psychosocial Re-Evaluation:   Psychosocial Discharge (Final Psychosocial Re-Evaluation):   Vocational Rehabilitation: Provide vocational rehab assistance to qualifying candidates.   Vocational Rehab Evaluation & Intervention:   Education: Education Goals: Education classes will be provided on a variety of topics geared toward better understanding of heart health and risk factor modification. Participant will state understanding/return demonstration of topics presented as noted by education test scores.  Learning Barriers/Preferences:  Learning Barriers/Preferences - 09/12/23 1451       Learning Barriers/Preferences   Learning Barriers None    Learning Preferences None          General Cardiac Education Topics:  AED/CPR: - Group verbal and written instruction with the use of models to demonstrate the basic use of the AED with the basic ABC's of resuscitation.   Test and Procedures: - Group verbal and visual presentation and models provide information about basic cardiac anatomy and function. Reviews the testing methods done to diagnose heart disease and the outcomes of the test results. Describes the treatment choices: Medical Management,  Angioplasty, or Coronary Bypass Surgery for treating various heart conditions including Myocardial Infarction, Angina, Valve Disease, and Cardiac Arrhythmias. Written material provided at class time. Flowsheet Row Cardiac Rehab from 10/15/2023 in [facility] Cardiac and Pulmonary Rehab  Education need identified 10/15/23    Medication Safety: - Group verbal and visual instruction to review commonly prescribed medications for heart and lung disease. Reviews the medication, class of the drug, and side effects. Includes the steps to properly store meds and maintain the prescription regimen. Written material provided at class time.   Intimacy: - Group verbal instruction through game format to discuss how heart and lung disease can affect sexual intimacy. Written material provided at class time.   Know Your Numbers and Heart Failure: - Group verbal and visual instruction to discuss disease risk factors for cardiac and pulmonary disease and treatment options.  Reviews associated critical values for Overweight/Obesity, Hypertension, Cholesterol, and Diabetes.  Discusses basics of heart failure: signs/symptoms and treatments.  Introduces Heart Failure Zone chart for action plan for heart failure. Written material provided at class time. Flowsheet Row Cardiac Rehab from 10/15/2023 in [facility] Cardiac and Pulmonary Rehab  Education need identified 10/15/23    Infection Prevention: - Provides verbal and written material to  individual with discussion of infection control including proper hand washing and proper equipment cleaning during exercise session. Flowsheet Row Cardiac Rehab from 10/15/2023 in [facility] Cardiac and Pulmonary Rehab  Date 10/15/23  Educator MB  Instruction Review Code 1- Verbalizes Understanding    Falls Prevention: - Provides verbal and written material to individual with discussion of falls prevention and safety. Flowsheet Row Cardiac Rehab from 10/15/2023 in [facility] Cardiac and Pulmonary Rehab   Date 10/15/23  Educator MB  Instruction Review Code 1- Verbalizes Understanding    Other: -Provides group and verbal instruction on various topics (see comments)   Knowledge Questionnaire Score:  Knowledge Questionnaire Score - 10/15/23 1207       Knowledge Questionnaire Score   Pre Score 22/26          Core Components/Risk Factors/Patient Goals at Admission:  Personal Goals and Risk Factors at Admission - 10/15/23 1207       Core Components/Risk Factors/Patient Goals on Admission    Weight Management Yes;Weight Loss    Intervention Weight Management: Develop a combined nutrition and exercise program designed to reach desired caloric intake, while maintaining appropriate intake of nutrient and fiber, sodium and fats, and appropriate energy expenditure required for the weight goal.;Weight Management: Provide education and appropriate resources to help participant work on and attain dietary goals.;Weight Management/Obesity: Establish reasonable short term and long term weight goals.    Admit Weight 192 lb 8 oz (87.3 kg)    Goal Weight: Short Term 180 lb (81.6 kg)    Goal Weight: Long Term 160 lb (72.6 kg)    Expected Outcomes Short Term: Continue to assess and modify interventions until short term weight is achieved;Long Term: Adherence to nutrition and physical activity/exercise program aimed toward attainment of established weight goal;Weight Loss: Understanding of general recommendations for a balanced deficit meal plan, which promotes 1-2 lb weight loss per week and includes a negative energy balance of [phone] kcal/d;Understanding recommendations for meals to include 15-35% energy as protein, 25-35% energy from fat, 35-60% energy from carbohydrates, less than 200mg  of dietary cholesterol, 20-35 gm of total fiber daily;Understanding of distribution of calorie intake throughout the day with the consumption of 4-5 meals/snacks    Diabetes Yes   Diet controlled   Intervention  Provide education about signs/symptoms and action to take for hypo/hyperglycemia.;Provide education about proper nutrition, including hydration, and aerobic/resistive exercise prescription along with prescribed medications to achieve blood glucose in normal ranges: Fasting glucose 65-99 mg/dL    Expected Outcomes Short Term: Participant verbalizes understanding of the signs/symptoms and immediate care of hyper/hypoglycemia, proper foot care and importance of medication, aerobic/resistive exercise and nutrition plan for blood glucose control.;Long Term: Attainment of HbA1C < 7%.    Hypertension Yes    Intervention Provide education on lifestyle modifcations including regular physical activity/exercise, weight management, moderate sodium restriction and increased consumption of fresh fruit, vegetables, and low fat dairy, alcohol moderation, and smoking cessation.;Monitor prescription use compliance.    Expected Outcomes Short Term: Continued assessment and intervention until BP is < 140/58mm HG in hypertensive participants. < 130/63mm HG in hypertensive participants with diabetes, heart failure or chronic kidney disease.;Long Term: Maintenance of blood pressure at goal levels.    Lipids Yes    Intervention Provide education and support for participant on nutrition & aerobic/resistive exercise along with prescribed medications to achieve LDL 70mg , HDL >40mg .    Expected Outcomes Short Term: Participant states understanding of desired cholesterol values and is compliant with medications prescribed. Participant is following  exercise prescription and nutrition guidelines.;Long Term: Cholesterol controlled with medications as prescribed, with individualized exercise RX and with personalized nutrition plan. Value goals: LDL < 70mg , HDL > 40 mg.          Education:Diabetes - Individual verbal and written instruction to review signs/symptoms of diabetes, desired ranges of glucose level fasting, after meals and  with exercise. Acknowledge that pre and post exercise glucose checks will be done for 3 sessions at entry of program. Flowsheet Row Cardiac Rehab from 10/15/2023 in [facility] Cardiac and Pulmonary Rehab  Date 10/15/23  Educator MB  Instruction Review Code 1- Verbalizes Understanding  [Diet controlled]    Core Components/Risk Factors/Patient Goals Review:    Core Components/Risk Factors/Patient Goals at Discharge (Final Review):    ITP Comments:  ITP Comments     Row Name 09/12/23 1500 10/15/23 1157         ITP Comments Virtual Visit completed. Patient informed on EP and RD appointment and 6 Minute walk test. Patient also informed of patient health questionnaires on My Chart. Patient Verbalizes understanding. Visit diagnosis can be found in [facility] 12/11/2023. Completed and gym orientation for cardiac rehab. Initial ITP created and sent for review to Dr. Oneil Pinal, Medical Director.         Comments: Initial ITP

## 2023-10-16 ENCOUNTER — Encounter

## 2023-10-16 DIAGNOSIS — I214 Non-ST elevation (NSTEMI) myocardial infarction: Secondary | ICD-10-CM

## 2023-10-16 DIAGNOSIS — Z955 Presence of coronary angioplasty implant and graft: Secondary | ICD-10-CM

## 2023-10-16 NOTE — Progress Notes (Signed)
 Daily Session Note  Patient Details  Name: [name] MRN: [id number] Date of Birth: [birth year] Referring Provider:   Flowsheet Row Cardiac Rehab from 10/15/2023 in [facility] Cardiac and Pulmonary Rehab  Referring Provider Darron Grass, MD    Encounter Date: 10/16/2023  Check In:  Session Check In - 10/16/23 0731       Check-In   Supervising physician immediately available to respond to emergencies See telemetry face sheet for immediately available ER MD    Location [facility]-Cardiac & Pulmonary Rehab    Staff Present Burnard Davenport RN,BSN,MPA;Margaret Best, MS, Exercise Physiologist;Jason Elnor [facility]    Virtual Visit No    Medication changes reported     No    Fall or balance concerns reported    No    Warm-up and Cool-down Performed on first and last piece of equipment    Resistance Training Performed Yes    VAD Patient? No    PAD/SET Patient? No      Pain Assessment   Currently in Pain? No/denies             Social History   Tobacco Use  Smoking Status Former   Current packs/day: 0.00   Types: Cigarettes   Quit date: 01/16/1978   Years since quitting: 45.7  Smokeless Tobacco Never  Tobacco Comments   REMOTELY QUIT IN 1987 AFTER A FEW YEARS OF USE    Goals Met:  Independence with exercise equipment Exercise tolerated well No report of concerns or symptoms today Strength training completed today  Goals Unmet:  Not Applicable  Comments: First full day of exercise!  Patient was oriented to gym and equipment including functions, settings, policies, and procedures.  Patient's individual exercise prescription and treatment plan were reviewed.  All starting workloads were established based on the results of the 6 minute walk test done at initial orientation visit.  The plan for exercise progression was also introduced and progression will be customized based on patient's performance and goals.     Dr. Oneil Pinal is Medical Director for [facility] Cardiac  Rehabilitation.  Dr. Fuad Aleskerov is Medical Director for [facility] Pulmonary Rehabilitation.

## 2023-10-22 ENCOUNTER — Encounter: Attending: Cardiovascular Disease

## 2023-10-22 DIAGNOSIS — I214 Non-ST elevation (NSTEMI) myocardial infarction: Secondary | ICD-10-CM | POA: Insufficient documentation

## 2023-10-22 DIAGNOSIS — Z955 Presence of coronary angioplasty implant and graft: Secondary | ICD-10-CM | POA: Diagnosis present

## 2023-10-22 NOTE — Progress Notes (Signed)
 Daily Session Note  Patient Details  Name: [name] MRN: [id number] Date of Birth: [birth year] Referring Provider:   Flowsheet Row Cardiac Rehab from 10/15/2023 in [facility] Cardiac and Pulmonary Rehab  Referring Provider Darron Grass, MD    Encounter Date: 10/22/2023  Check In:  Session Check In - 10/22/23 0918       Check-In   Supervising physician immediately available to respond to emergencies See telemetry face sheet for immediately available ER MD    Location [facility]-Cardiac & Pulmonary Rehab    Staff Present Burnard Davenport RN,BSN,MPA;Joseph Hood RCP,RRT,BSRT;Maxon Burnell BS, Exercise Physiologist;Morganna Styles Dyane HECKLE, ACSM CEP, Exercise Physiologist    Virtual Visit No    Medication changes reported     No    Fall or balance concerns reported    No    Warm-up and Cool-down Performed on first and last piece of equipment    Resistance Training Performed Yes    VAD Patient? No    PAD/SET Patient? No      Pain Assessment   Currently in Pain? No/denies             Social History   Tobacco Use  Smoking Status Former   Current packs/day: 0.00   Types: Cigarettes   Quit date: 01/16/1978   Years since quitting: 45.7  Smokeless Tobacco Never  Tobacco Comments   REMOTELY QUIT IN 1987 AFTER A FEW YEARS OF USE    Goals Met:  Independence with exercise equipment Exercise tolerated well No report of concerns or symptoms today Strength training completed today  Goals Unmet:  Not Applicable  Comments: Pt able to follow exercise prescription today without complaint.  Will continue to monitor for progression.    Dr. Oneil Pinal is Medical Director for [facility] Cardiac Rehabilitation.  Dr. Fuad Aleskerov is Medical Director for [facility] Pulmonary Rehabilitation.

## 2023-10-25 ENCOUNTER — Encounter: Admitting: Emergency Medicine

## 2023-10-25 DIAGNOSIS — I214 Non-ST elevation (NSTEMI) myocardial infarction: Secondary | ICD-10-CM

## 2023-10-25 DIAGNOSIS — Z955 Presence of coronary angioplasty implant and graft: Secondary | ICD-10-CM

## 2023-10-25 NOTE — Progress Notes (Signed)
 Daily Session Note  Patient Details  Name: [name] MRN: [id number] Date of Birth: 10-Mar-1945 Referring Provider:   Flowsheet Row Cardiac Rehab from 10/15/2023 in [facility] Cardiac and Pulmonary Rehab  Referring Provider Darron Grass, MD    Encounter Date: 10/25/2023  Check In:  Session Check In - 10/25/23 0935       Check-In   Supervising physician immediately available to respond to emergencies See telemetry face sheet for immediately available ER MD    Location [facility]-Cardiac & Pulmonary Rehab    Staff Present Fairy Plater RCP,RRT,BSRT;Maxon Conetta BS, Exercise Physiologist;Margaret Best, MS, Exercise Physiologist;Blaike Vickers RN,BSN;Jason Elnor RDN,LDN    Virtual Visit No    Medication changes reported     No    Fall or balance concerns reported    No    Warm-up and Cool-down Performed on first and last piece of equipment    Resistance Training Performed Yes    VAD Patient? No    PAD/SET Patient? No      Pain Assessment   Currently in Pain? No/denies             Social History   Tobacco Use  Smoking Status Former   Current packs/day: 0.00   Types: Cigarettes   Quit date: 01/16/1978   Years since quitting: 45.8  Smokeless Tobacco Never  Tobacco Comments   REMOTELY QUIT IN 1987 AFTER A FEW YEARS OF USE    Goals Met:  Independence with exercise equipment Exercise tolerated well No report of concerns or symptoms today Strength training completed today  Goals Unmet:  Not Applicable  Comments: Pt able to follow exercise prescription today without complaint.  Will continue to monitor for progression.    Dr. Oneil Pinal is Medical Director for [facility] Cardiac Rehabilitation.  Dr. Fuad Aleskerov is Medical Director for [facility] Pulmonary Rehabilitation.

## 2023-10-29 ENCOUNTER — Encounter

## 2023-10-29 DIAGNOSIS — I214 Non-ST elevation (NSTEMI) myocardial infarction: Secondary | ICD-10-CM | POA: Diagnosis not present

## 2023-10-29 DIAGNOSIS — Z955 Presence of coronary angioplasty implant and graft: Secondary | ICD-10-CM

## 2023-10-29 NOTE — Progress Notes (Signed)
 Daily Session Note  Patient Details  Name: [name] MRN: [id number] Date of Birth: March 30, 1945 Referring Provider:   Flowsheet Row Cardiac Rehab from 10/15/2023 in [facility] Cardiac and Pulmonary Rehab  Referring Provider Darron Grass, MD    Encounter Date: 10/29/2023  Check In:  Session Check In - 10/29/23 0911       Check-In   Supervising physician immediately available to respond to emergencies See telemetry face sheet for immediately available ER MD    Location [facility]-Cardiac & Pulmonary Rehab    Staff Present Burnard Davenport RN,BSN,MPA;Joseph Hood RCP,RRT,BSRT;Maxon Burnell BS, Exercise Physiologist;Algis Lehenbauer Dyane HECKLE, ACSM CEP, Exercise Physiologist    Virtual Visit No    Medication changes reported     No    Fall or balance concerns reported    No    Warm-up and Cool-down Performed on first and last piece of equipment    Resistance Training Performed Yes    VAD Patient? No    PAD/SET Patient? No      Pain Assessment   Currently in Pain? No/denies             Social History   Tobacco Use  Smoking Status Former   Current packs/day: 0.00   Types: Cigarettes   Quit date: 01/16/1978   Years since quitting: 45.8  Smokeless Tobacco Never  Tobacco Comments   REMOTELY QUIT IN 1987 AFTER A FEW YEARS OF USE    Goals Met:  Independence with exercise equipment Exercise tolerated well No report of concerns or symptoms today Strength training completed today  Goals Unmet:  Not Applicable  Comments: Pt able to follow exercise prescription today without complaint.  Will continue to monitor for progression.    Dr. Oneil Pinal is Medical Director for [facility] Cardiac Rehabilitation.  Dr. Fuad Aleskerov is Medical Director for [facility] Pulmonary Rehabilitation.

## 2023-11-01 ENCOUNTER — Encounter

## 2023-11-01 DIAGNOSIS — I214 Non-ST elevation (NSTEMI) myocardial infarction: Secondary | ICD-10-CM

## 2023-11-01 DIAGNOSIS — Z955 Presence of coronary angioplasty implant and graft: Secondary | ICD-10-CM

## 2023-11-01 NOTE — Progress Notes (Signed)
 Daily Session Note  Patient Details  Name: [name] MRN: [id number] Date of Birth: 1945-04-30 Referring Provider:   Flowsheet Row Cardiac Rehab from 10/15/2023 in [facility] Cardiac and Pulmonary Rehab  Referring Provider Darron Grass, MD    Encounter Date: 11/01/2023  Check In:  Session Check In - 11/01/23 0916       Check-In   Supervising physician immediately available to respond to emergencies See telemetry face sheet for immediately available ER MD    Location [facility]-Cardiac & Pulmonary Rehab    Staff Present Burnard Davenport RN,BSN,MPA;Maxon Conetta BS, Exercise Physiologist;Joseph Rolinda NORWOOD HARMAN Hobert Best, MS, Exercise Physiologist    Virtual Visit No    Medication changes reported     No    Fall or balance concerns reported    No    Warm-up and Cool-down Performed on first and last piece of equipment    Resistance Training Performed Yes    VAD Patient? No    PAD/SET Patient? No      Pain Assessment   Currently in Pain? No/denies             Social History   Tobacco Use  Smoking Status Former   Current packs/day: 0.00   Types: Cigarettes   Quit date: 01/16/1978   Years since quitting: 45.8  Smokeless Tobacco Never  Tobacco Comments   REMOTELY QUIT IN 1987 AFTER A FEW YEARS OF USE    Goals Met:  Independence with exercise equipment Exercise tolerated well No report of concerns or symptoms today Strength training completed today  Goals Unmet:  Not Applicable  Comments: Pt able to follow exercise prescription today without complaint.  Will continue to monitor for progression.    Dr. Oneil Pinal is Medical Director for [facility] Cardiac Rehabilitation.  Dr. Fuad Aleskerov is Medical Director for [facility] Pulmonary Rehabilitation.

## 2023-11-05 ENCOUNTER — Encounter

## 2023-11-05 DIAGNOSIS — I214 Non-ST elevation (NSTEMI) myocardial infarction: Secondary | ICD-10-CM | POA: Diagnosis not present

## 2023-11-05 DIAGNOSIS — Z955 Presence of coronary angioplasty implant and graft: Secondary | ICD-10-CM

## 2023-11-05 NOTE — Progress Notes (Signed)
 Daily Session Note  Patient Details  Name: TORREY HORSEMAN MRN: [id number] Date of Birth: 04-07-1945 Referring Provider:   Flowsheet Row Cardiac Rehab from 10/15/2023 in [facility] Cardiac and Pulmonary Rehab  Referring Provider Darron Grass, MD    Encounter Date: 11/05/2023  Check In:  Session Check In - 11/05/23 0915       Check-In   Supervising physician immediately available to respond to emergencies See telemetry face sheet for immediately available ER MD    Location [facility]-Cardiac & Pulmonary Rehab    Staff Present Burnard Davenport RN,BSN,MPA;Joseph Hood RCP,RRT,BSRT;Maxon Burnell BS, Exercise Physiologist;Pegah Segel Dyane HECKLE, ACSM CEP, Exercise Physiologist;Jason Elnor RDN,LDN    Virtual Visit No    Medication changes reported     No    Fall or balance concerns reported    No    Warm-up and Cool-down Performed on first and last piece of equipment    Resistance Training Performed Yes    VAD Patient? No    PAD/SET Patient? No      Pain Assessment   Currently in Pain? No/denies             Social History   Tobacco Use  Smoking Status Former   Current packs/day: 0.00   Types: Cigarettes   Quit date: 01/16/1978   Years since quitting: 45.8  Smokeless Tobacco Never  Tobacco Comments   REMOTELY QUIT IN 1987 AFTER A FEW YEARS OF USE    Goals Met:  Independence with exercise equipment Exercise tolerated well No report of concerns or symptoms today Strength training completed today  Goals Unmet:  Not Applicable  Comments: Pt able to follow exercise prescription today without complaint.  Will continue to monitor for progression.    Dr. Oneil Pinal is Medical Director for [facility] Cardiac Rehabilitation.  Dr. Fuad Aleskerov is Medical Director for [facility] Pulmonary Rehabilitation.

## 2023-11-07 ENCOUNTER — Encounter: Payer: Self-pay | Admitting: *Deleted

## 2023-11-07 DIAGNOSIS — I214 Non-ST elevation (NSTEMI) myocardial infarction: Secondary | ICD-10-CM

## 2023-11-07 DIAGNOSIS — Z955 Presence of coronary angioplasty implant and graft: Secondary | ICD-10-CM

## 2023-11-07 NOTE — Progress Notes (Signed)
 Cardiac Individual Treatment Plan  Patient Details  Name: [name] MRN: [id number] Date of Birth: 11/11/1945 Referring Provider:   Flowsheet Row Cardiac Rehab from 10/15/2023 in [facility] Cardiac and Pulmonary Rehab  Referring Provider Darron Grass, MD    Initial Encounter Date:  Flowsheet Row Cardiac Rehab from 10/15/2023 in [facility] Cardiac and Pulmonary Rehab  Date 10/15/23    Visit Diagnosis: NSTEMI (non-ST elevation myocardial infarction) [facility])  Status post coronary artery stent placement  Patient's Home Medications on Admission:  Current Outpatient Medications:    albuterol  (VENTOLIN  HFA) 108 (90 Base) MCG/ACT inhaler, Inhale 1-2 puffs into the lungs every 4-6 hours as needed for cough/wheeze., Disp: 18 g, Rfl: 0   ALPRAZolam  (XANAX ) 0.25 MG tablet, Take 1 tablet (0.25 mg total) by mouth 2 (two) times daily as needed for anxiety., Disp: 30 tablet, Rfl: 0   bimatoprost  (LUMIGAN ) 0.01 % SOLN, Place 1 drop into both eyes at bedtime., Disp: 2.5 mL, Rfl: 5   budesonide  (PULMICORT ) 180 MCG/ACT inhaler, Inhale 2 puffs into the lungs 2 (two) times daily., Disp: , Rfl:    carvedilol  (COREG ) 3.125 MG tablet, Take 1 tablet (3.125 mg total) by mouth 2 (two) times daily with a meal., Disp: 180 tablet, Rfl: 3   cetirizine (ZYRTEC) 10 MG tablet, Take 10 mg by mouth daily., Disp: , Rfl:    clopidogrel  (PLAVIX ) 75 MG tablet, Take 1 tablet (75 mg total) by mouth daily., Disp: 90 tablet, Rfl: 3   EPINEPHrine 0.3 mg/0.3 mL IJ SOAJ injection, as needed., Disp: , Rfl:    ezetimibe  (ZETIA ) 10 MG tablet, Take 1 tablet (10 mg total) by mouth daily., Disp: 90 tablet, Rfl: 3   MILK THISTLE PO, Take 1 capsule by mouth daily. , Disp: , Rfl:    nitroGLYCERIN  (NITROSTAT ) 0.4 MG SL tablet, Place 1 tablet (0.4 mg total) under the tongue every 5 (five) minutes for up to 3 doses as needed for chest pain., Disp: 25 tablet, Rfl: 0   polyethylene glycol (MIRALAX  / GLYCOLAX ) 17 g packet, Take 17 g by mouth daily as  needed for mild constipation., Disp: 14 each, Rfl: 0   Probiotic Product (PROBIOTIC COMPLEX ACIDOPHILUS PO), Take 1 capsule by mouth daily., Disp: , Rfl:    rosuvastatin  (CRESTOR ) 5 MG tablet, Take 1 tablet (5 mg total) by mouth daily., Disp: 90 tablet, Rfl: 3   sodium chloride  (OCEAN) 0.65 % SOLN nasal spray, Place 1 spray into the nose as needed., Disp: , Rfl:   Past Medical History: Past Medical History:  Diagnosis Date   Acute GI bleeding 02/08/2023   Angina pectoris 04/14/2015   Asthma    Asthma due to environmental allergies    grass, mold, trees, dust   Cataract    Cellulitis    LEFT LEG   Diabetes mellitus    CONTROLLED ON DIET ALONE   Diverticulitis of colon 02/09/2023   Diverticulitis of intestine with bleeding 02/08/2023   Edema leg    LEFT LEG...CHRONIC   Endometrial hyperplasia    External hemorrhoid    Fatty liver    Glaucoma    Hepatic cyst    STABLE PER 05/20/2008 ULTRASOUND   Hypertension    borderline...controlled since taking herself of HCTZ IN JULY   IBS (irritable bowel syndrome)    Lactic acidosis 02/09/2023   Murmur, cardiac    Neuropathy    Numbness of fingers of both hands 04/17/2015   OSA on CPAP    Osteoarthritis  Psoriasis    Sleep apnea    CPAP    Tobacco Use: Social History   Tobacco Use  Smoking Status Former   Current packs/day: 0.00   Types: Cigarettes   Quit date: 01/16/1978   Years since quitting: 45.8  Smokeless Tobacco Never  Tobacco Comments   REMOTELY QUIT IN 1987 AFTER A FEW YEARS OF USE    Labs: Review Flowsheet  More data exists      Latest Ref Rng & Units 11/13/2022 02/09/2023 06/14/2023 09/11/2023 09/18/2023  Labs for ITP Cardiac and Pulmonary Rehab  Cholestrol <200 mg/dL 810  - 780  822  -  LDL (calc) mg/dL (calc) 891  - 860  896  -  Direct LDL mg/dL 872.9  - 842.9  - -  HDL-C > OR = 50 mg/dL 48.59  - 49.79  46  -  Trlycerides <150 mg/dL 849.9  - 848.9  837  -  Hemoglobin A1c 4.0 - 5.6 % 7.1  6.2  6.2  - 5.8       Exercise Target Goals: Exercise Program Goal: Individual exercise prescription set using results from initial 6 min walk test and THRR while considering  patient's activity barriers and safety.   Exercise Prescription Goal: Initial exercise prescription builds to 30-45 minutes a day of aerobic activity, 2-3 days per week.  Home exercise guidelines will be given to patient during program as part of exercise prescription that the participant will acknowledge.   Education: Aerobic Exercise: - Group verbal and visual presentation on the components of exercise prescription. Introduces F.I.T.T principle from ACSM for exercise prescriptions.  Reviews F.I.T.T. principles of aerobic exercise including progression. Written material provided at class time. Flowsheet Row Cardiac Rehab from 10/15/2023 in [facility] Cardiac and Pulmonary Rehab  Education need identified 10/15/23    Education: Resistance Exercise: - Group verbal and visual presentation on the components of exercise prescription. Introduces F.I.T.T principle from ACSM for exercise prescriptions  Reviews F.I.T.T. principles of resistance exercise including progression. Written material provided at class time.    Education: Exercise & Equipment Safety: - Individual verbal instruction and demonstration of equipment use and safety with use of the equipment. Flowsheet Row Cardiac Rehab from 10/15/2023 in [facility] Cardiac and Pulmonary Rehab  Date 10/15/23  Educator MB  Instruction Review Code 1- Verbalizes Understanding    Education: Exercise Physiology & General Exercise Guidelines: - Group verbal and written instruction with models to review the exercise physiology of the cardiovascular system and associated critical values. Provides general exercise guidelines with specific guidelines to those with heart or lung disease. Written material provided at class time.   Education: Flexibility, Balance, Mind/Body Relaxation: - Group verbal and  visual presentation with interactive activity on the components of exercise prescription. Introduces F.I.T.T principle from ACSM for exercise prescriptions. Reviews F.I.T.T. principles of flexibility and balance exercise training including progression. Also discusses the mind body connection.  Reviews various relaxation techniques to help reduce and manage stress (i.e. Deep breathing, progressive muscle relaxation, and visualization). Balance handout provided to take home. Written material provided at class time.   Activity Barriers & Risk Stratification:  Activity Barriers & Cardiac Risk Stratification - 10/15/23 1158       Activity Barriers & Cardiac Risk Stratification   Activity Barriers Arthritis;Other (comment);Deconditioning    Comments Neuropathy    Cardiac Risk Stratification Moderate          6 Minute Walk:  6 Minute Walk     Row Name 10/15/23 1157  6 Minute Walk   Phase Initial     Distance 1070 feet     Walk Time 6 minutes     # of Rest Breaks 0     MPH 2.03     METS 2.02     RPE 15     Perceived Dyspnea  2     VO2 Peak 7.06     Symptoms Yes (comment)     Comments SOB     Resting HR 68 bpm     Resting BP 116/74     Resting Oxygen Saturation  97 %     Exercise Oxygen Saturation  during 6 min walk 100 %     Max Ex. HR 109 bpm     Max Ex. BP 144/70     2 Minute Post BP 110/64        Oxygen Initial Assessment:   Oxygen Re-Evaluation:   Oxygen Discharge (Final Oxygen Re-Evaluation):   Initial Exercise Prescription:  Initial Exercise Prescription - 10/15/23 1200       Date of Initial Exercise RX and Referring Provider   Date 10/15/23    Referring Provider Darron Grass, MD      Oxygen   Maintain Oxygen Saturation 88% or higher      Recumbant Bike   Level 1    RPM 5    Watts 15    Minutes 15    METs 2.02      NuStep   Level 1    SPM 80    Minutes 15    METs 2.02      Arm Ergometer   Level 1    RPM 25    Minutes 15    METs  2.02      Biostep-RELP   Level 1    SPM 50    Minutes 15    METs 2.02      Track   Laps 18    Minutes 15    METs 1.98      Prescription Details   Frequency (times per week) 2    Duration Progress to 30 minutes of continuous aerobic without signs/symptoms of physical distress      Intensity   THRR 40-80% of Max Heartrate 97-126    Ratings of Perceived Exertion 11-13    Perceived Dyspnea 0-4      Progression   Progression Continue to progress workloads to maintain intensity without signs/symptoms of physical distress.      Resistance Training   Training Prescription Yes    Weight 2 lb    Reps 10-15          Perform Capillary Blood Glucose checks as needed.  Exercise Prescription Changes:   Exercise Prescription Changes     Row Name 10/15/23 1200 10/22/23 1100           Response to Exercise   Blood Pressure (Admit) 116/74 108/72      Blood Pressure (Exercise) 144/70 140/70      Blood Pressure (Exit) 110/64 120/70      Heart Rate (Admit) 68 bpm 74 bpm      Heart Rate (Exercise) 109 bpm 98 bpm      Heart Rate (Exit) 77 bpm 79 bpm      Oxygen Saturation (Admit) 97 % --      Oxygen Saturation (Exercise) 100 % --      Oxygen Saturation (Exit) 97 % --      Rating of Perceived Exertion (Exercise) 15  15      Perceived Dyspnea (Exercise) 2 --      Symptoms SOB none      Comments results first 2 weeks of exercise      Duration -- Progress to 30 minutes of  aerobic without signs/symptoms of physical distress      Intensity -- THRR unchanged        Progression   Progression -- Continue to progress workloads to maintain intensity without signs/symptoms of physical distress.      Average METs 2.02 2.02        Resistance Training   Training Prescription -- Yes      Weight -- 2 lb      Reps -- 10-15        Interval Training   Interval Training -- No        Recumbant Bike   Level -- 1.2      Watts -- 15      Minutes -- 15      METs -- 2.54         NuStep   Level -- 1      Minutes -- 15      METs -- 1.5        Oxygen   Maintain Oxygen Saturation -- 88% or higher         Exercise Comments:   Exercise Comments     Row Name 10/16/23 0735           Exercise Comments First full day of exercise!  Patient was oriented to gym and equipment including functions, settings, policies, and procedures.  Patient's individual exercise prescription and treatment plan were reviewed.  All starting workloads were established based on the results of the 6 minute walk test done at initial orientation visit.  The plan for exercise progression was also introduced and progression will be customized based on patient's performance and goals.          Exercise Goals and Review:   Exercise Goals     Row Name 10/15/23 1203             Exercise Goals   Increase Physical Activity Yes       Intervention Provide advice, education, support and counseling about physical activity/exercise needs.;Develop an individualized exercise prescription for aerobic and resistive training based on initial evaluation findings, risk stratification, comorbidities and participant's personal goals.       Expected Outcomes Short Term: Attend rehab on a regular basis to increase amount of physical activity.;Long Term: Add in home exercise to make exercise part of routine and to increase amount of physical activity.;Long Term: Exercising regularly at least 3-5 days a week.       Increase Strength and Stamina Yes       Intervention Provide advice, education, support and counseling about physical activity/exercise needs.;Develop an individualized exercise prescription for aerobic and resistive training based on initial evaluation findings, risk stratification, comorbidities and participant's personal goals.       Expected Outcomes Short Term: Increase workloads from initial exercise prescription for resistance, speed, and METs.;Short Term: Perform resistance training exercises  routinely during rehab and add in resistance training at home;Long Term: Improve cardiorespiratory fitness, muscular endurance and strength as measured by increased METs and functional capacity ( )       Able to understand and use rate of perceived exertion (RPE) scale Yes       Intervention Provide education and explanation on how to use RPE scale  Expected Outcomes Short Term: Able to use RPE daily in rehab to express subjective intensity level;Long Term:  Able to use RPE to guide intensity level when exercising independently       Able to understand and use Dyspnea scale Yes       Intervention Provide education and explanation on how to use Dyspnea scale       Expected Outcomes Short Term: Able to use Dyspnea scale daily in rehab to express subjective sense of shortness of breath during exertion;Long Term: Able to use Dyspnea scale to guide intensity level when exercising independently       Knowledge and understanding of Target Heart Rate Range (THRR) Yes       Intervention Provide education and explanation of THRR including how the numbers were predicted and where they are located for reference       Expected Outcomes Short Term: Able to state/look up THRR;Short Term: Able to use daily as guideline for intensity in rehab;Long Term: Able to use THRR to govern intensity when exercising independently       Able to check pulse independently Yes       Intervention Provide education and demonstration on how to check pulse in carotid and radial arteries.;Review the importance of being able to check your own pulse for safety during independent exercise       Expected Outcomes Short Term: Able to explain why pulse checking is important during independent exercise;Long Term: Able to check pulse independently and accurately       Understanding of Exercise Prescription Yes       Intervention Provide education, explanation, and written materials on patient's individual exercise prescription        Expected Outcomes Short Term: Able to explain program exercise prescription;Long Term: Able to explain home exercise prescription to exercise independently          Exercise Goals Re-Evaluation :  Exercise Goals Re-Evaluation     Row Name 10/16/23 0735 10/22/23 1153           Exercise Goal Re-Evaluation   Exercise Goals Review Increase Physical Activity;Understanding of Exercise Prescription;Knowledge and understanding of Target Heart Rate Range (THRR);Able to understand and use rate of perceived exertion (RPE) scale;Increase Strength and Stamina;Able to understand and use Dyspnea scale;Able to check pulse independently Increase Physical Activity;Increase Strength and Stamina;Understanding of Exercise Prescription      Comments Reviewed RPE and dyspnea scale, THR and program prescription with pt today.  Pt voiced understanding and was given a copy of goals to take home. Marsella is off to a great start in rehab, and was able to attend her first session during this review period. During her session she was able to use the recumbent bike at level 1.2, and T4 nustep at level 1. We will continue to moniotr her progress in the program.      Expected Outcomes Short: Use RPE daily to regulate intensity. Long: Follow program prescription in THR. Short: Continue to follow exercise prescription. Long: Continue to improve strength and stamina.         Discharge Exercise Prescription (Final Exercise Prescription Changes):  Exercise Prescription Changes - 10/22/23 1100       Response to Exercise   Blood Pressure (Admit) 108/72    Blood Pressure (Exercise) 140/70    Blood Pressure (Exit) 120/70    Heart Rate (Admit) 74 bpm    Heart Rate (Exercise) 98 bpm    Heart Rate (Exit) 79 bpm    Rating of  Perceived Exertion (Exercise) 15    Symptoms none    Comments first 2 weeks of exercise    Duration Progress to 30 minutes of  aerobic without signs/symptoms of physical distress    Intensity THRR unchanged       Progression   Progression Continue to progress workloads to maintain intensity without signs/symptoms of physical distress.    Average METs 2.02      Resistance Training   Training Prescription Yes    Weight 2 lb    Reps 10-15      Interval Training   Interval Training No      Recumbant Bike   Level 1.2    Watts 15    Minutes 15    METs 2.54      NuStep   Level 1    Minutes 15    METs 1.5      Oxygen   Maintain Oxygen Saturation 88% or higher          Nutrition:  Target Goals: Understanding of nutrition guidelines, daily intake of sodium 1500mg , cholesterol 200mg , calories 30% from fat and 7% or less from saturated fats, daily to have 5 or more servings of fruits and vegetables.  Education: Nutrition 1 -Group instruction provided by verbal, written material, interactive activities, discussions, models, and posters to present general guidelines for heart healthy nutrition including macronutrients, label reading, and promoting whole foods over processed counterparts. Education serves as Pensions consultant of discussion of heart healthy eating for all. Written material provided at class time.    Education: Nutrition 2 -Group instruction provided by verbal, written material, interactive activities, discussions, models, and posters to present general guidelines for heart healthy nutrition including sodium, cholesterol, and saturated fat. Providing guidance of habit forming to improve blood pressure, cholesterol, and body weight. Written material provided at class time.     Biometrics:  Pre Biometrics - 10/15/23 1204       Pre Biometrics   Height 5' 6.6 (1.692 m)    Weight 192 lb 8 oz (87.3 kg)    Waist Circumference 40.5 inches    Hip Circumference 47 inches    Waist to Hip Ratio 0.86 %    BMI (Calculated) 30.5    Single Leg Stand 3.2 seconds           Nutrition Therapy Plan and Nutrition Goals:  Nutrition Therapy & Goals - 10/15/23 1206       Personal  Nutrition Goals   Nutrition Goal Waitning a couple weeks to schedule with RD.      Intervention Plan   Intervention Prescribe, educate and counsel regarding individualized specific dietary modifications aiming towards targeted core components such as weight, hypertension, lipid management, diabetes, heart failure and other comorbidities.    Expected Outcomes Short Term Goal: Understand basic principles of dietary content, such as calories, fat, sodium, cholesterol and nutrients.          Nutrition Assessments:  MEDIFICTS Score Key: >=70 Need to make dietary changes  40-70 Heart Healthy Diet <= 40 Therapeutic Level Cholesterol Diet  Flowsheet Row Cardiac Rehab from 10/15/2023 in [facility] Cardiac and Pulmonary Rehab  Picture Your Plate Total Score on Admission 70   Picture Your Plate Scores: <59 Unhealthy dietary pattern with much room for improvement. 41-50 Dietary pattern unlikely to meet recommendations for good health and room for improvement. 51-60 More healthful dietary pattern, with some room for improvement.  >60 Healthy dietary pattern, although there may be some specific behaviors that could be  improved.    Nutrition Goals Re-Evaluation:   Nutrition Goals Discharge (Final Nutrition Goals Re-Evaluation):   Psychosocial: Target Goals: Acknowledge presence or absence of significant depression and/or stress, maximize coping skills, provide positive support system. Participant is able to verbalize types and ability to use techniques and skills needed for reducing stress and depression.   Education: Stress, Anxiety, and Depression - Group verbal and visual presentation to define topics covered.  Reviews how body is impacted by stress, anxiety, and depression.  Also discusses healthy ways to reduce stress and to treat/manage anxiety and depression. Written material provided at class time.   Education: Sleep Hygiene -Provides group verbal and written instruction about how sleep  can affect your health.  Define sleep hygiene, discuss sleep cycles and impact of sleep habits. Review good sleep hygiene tips.   Initial Review & Psychosocial Screening:  Initial Psych Review & Screening - 09/12/23 1453       Initial Review   Current issues with Current Stress Concerns    Source of Stress Concerns Chronic Illness    Comments Alera gets down since her health event and her husband is picking up the slack.      Family Dynamics   Good Support System? Yes    Comments She can look to her husband, daughters and neighbors for support.      Barriers   Psychosocial barriers to participate in program The patient should benefit from training in stress management and relaxation.      Screening Interventions   Interventions Encouraged to exercise;Provide feedback about the scores to participant;To provide support and resources with identified psychosocial needs    Expected Outcomes Short Term goal: Utilizing psychosocial counselor, staff and physician to assist with identification of specific Stressors or current issues interfering with healing process. Setting desired goal for each stressor or current issue identified.;Long Term Goal: Stressors or current issues are controlled or eliminated.;Short Term goal: Identification and review with participant of any Quality of Life or Depression concerns found by scoring the questionnaire.;Long Term goal: The participant improves quality of Life and PHQ9 Scores as seen by post scores and/or verbalization of changes          Quality of Life Scores:   Quality of Life - 10/15/23 1205       Quality of Life   Select Quality of Life      Quality of Life Scores   Health/Function Pre 12.86 %    Socioeconomic Pre 30 %    Psych/Spiritual Pre 18.86 %    Family Pre 26.4 %    GLOBAL Pre 19.5 %         Scores of 19 and below usually indicate a poorer quality of life in these areas.  A difference of  2-3 points is a clinically meaningful  difference.  A difference of 2-3 points in the total score of the Quality of Life Index has been associated with significant improvement in overall quality of life, self-image, physical symptoms, and general health in studies assessing change in quality of life.  PHQ-9: Review Flowsheet  More data exists      10/15/2023 09/18/2023 07/25/2023 06/14/2023 01/11/2023  Depression screen PHQ 2/9  Decreased Interest 0 1 0 0 0  Down, Depressed, Hopeless 1 1 0 1 0  PHQ - 2 Score 1 2 0 1 0  Altered sleeping 0 0 - - 0  Tired, decreased energy 3 1 - - 0  Change in appetite 0 0 - - 0  Feeling bad or failure about yourself  1 1 - - 0  Trouble concentrating 0 0 - - 0  Moving slowly or fidgety/restless 0 0 - - 0  Suicidal thoughts 0 0 - - 0  PHQ-9 Score 5 4 - - 0  Difficult doing work/chores Somewhat difficult Somewhat difficult - - Not difficult at all   Interpretation of Total Score  Total Score Depression Severity:  1-4 = Minimal depression, 5-9 = Mild depression, 10-14 = Moderate depression, 15-19 = Moderately severe depression, 20-27 = Severe depression   Psychosocial Evaluation and Intervention:  Psychosocial Evaluation - 09/12/23 1455       Psychosocial Evaluation & Interventions   Interventions Encouraged to exercise with the program and follow exercise prescription;Relaxation education;Stress management education    Comments She can look to her husband, daughters and neighbors for support.Robena gets down since her health event and her husband is picking up the slack.    Expected Outcomes Short: Start [facility] to help with mood. Long: Maintain a healthy mental state    Continue Psychosocial Services  Follow up required by staff          Psychosocial Re-Evaluation:   Psychosocial Discharge (Final Psychosocial Re-Evaluation):   Vocational Rehabilitation: Provide vocational rehab assistance to qualifying candidates.   Vocational Rehab Evaluation &  Intervention:   Education: Education Goals: Education classes will be provided on a variety of topics geared toward better understanding of heart health and risk factor modification. Participant will state understanding/return demonstration of topics presented as noted by education test scores.  Learning Barriers/Preferences:  Learning Barriers/Preferences - 09/12/23 1451       Learning Barriers/Preferences   Learning Barriers None    Learning Preferences None          General Cardiac Education Topics:  AED/CPR: - Group verbal and written instruction with the use of models to demonstrate the basic use of the AED with the basic ABC's of resuscitation.   Test and Procedures: - Group verbal and visual presentation and models provide information about basic cardiac anatomy and function. Reviews the testing methods done to diagnose heart disease and the outcomes of the test results. Describes the treatment choices: Medical Management, Angioplasty, or Coronary Bypass Surgery for treating various heart conditions including Myocardial Infarction, Angina, Valve Disease, and Cardiac Arrhythmias. Written material provided at class time. Flowsheet Row Cardiac Rehab from 10/15/2023 in [facility] Cardiac and Pulmonary Rehab  Education need identified 10/15/23    Medication Safety: - Group verbal and visual instruction to review commonly prescribed medications for heart and lung disease. Reviews the medication, class of the drug, and side effects. Includes the steps to properly store meds and maintain the prescription regimen. Written material provided at class time.   Intimacy: - Group verbal instruction through game format to discuss how heart and lung disease can affect sexual intimacy. Written material provided at class time.   Know Your Numbers and Heart Failure: - Group verbal and visual instruction to discuss disease risk factors for cardiac and pulmonary disease and treatment options.   Reviews associated critical values for Overweight/Obesity, Hypertension, Cholesterol, and Diabetes.  Discusses basics of heart failure: signs/symptoms and treatments.  Introduces Heart Failure Zone chart for action plan for heart failure. Written material provided at class time. Flowsheet Row Cardiac Rehab from 10/15/2023 in [facility] Cardiac and Pulmonary Rehab  Education need identified 10/15/23    Infection Prevention: - Provides verbal and written material to individual with discussion of infection control including proper hand  washing and proper equipment cleaning during exercise session. Flowsheet Row Cardiac Rehab from 10/15/2023 in [facility] Cardiac and Pulmonary Rehab  Date 10/15/23  Educator MB  Instruction Review Code 1- Verbalizes Understanding    Falls Prevention: - Provides verbal and written material to individual with discussion of falls prevention and safety. Flowsheet Row Cardiac Rehab from 10/15/2023 in [facility] Cardiac and Pulmonary Rehab  Date 10/15/23  Educator MB  Instruction Review Code 1- Verbalizes Understanding    Other: -Provides group and verbal instruction on various topics (see comments)   Knowledge Questionnaire Score:  Knowledge Questionnaire Score - 10/15/23 1207       Knowledge Questionnaire Score   Pre Score 22/26          Core Components/Risk Factors/Patient Goals at Admission:  Personal Goals and Risk Factors at Admission - 10/15/23 1207       Core Components/Risk Factors/Patient Goals on Admission    Weight Management Yes;Weight Loss    Intervention Weight Management: Develop a combined nutrition and exercise program designed to reach desired caloric intake, while maintaining appropriate intake of nutrient and fiber, sodium and fats, and appropriate energy expenditure required for the weight goal.;Weight Management: Provide education and appropriate resources to help participant work on and attain dietary goals.;Weight Management/Obesity: Establish  reasonable short term and long term weight goals.    Admit Weight 192 lb 8 oz (87.3 kg)    Goal Weight: Short Term 180 lb (81.6 kg)    Goal Weight: Long Term 160 lb (72.6 kg)    Expected Outcomes Short Term: Continue to assess and modify interventions until short term weight is achieved;Long Term: Adherence to nutrition and physical activity/exercise program aimed toward attainment of established weight goal;Weight Loss: Understanding of general recommendations for a balanced deficit meal plan, which promotes 1-2 lb weight loss per week and includes a negative energy balance of [phone] kcal/d;Understanding recommendations for meals to include 15-35% energy as protein, 25-35% energy from fat, 35-60% energy from carbohydrates, less than 200mg  of dietary cholesterol, 20-35 gm of total fiber daily;Understanding of distribution of calorie intake throughout the day with the consumption of 4-5 meals/snacks    Diabetes Yes   Diet controlled   Intervention Provide education about signs/symptoms and action to take for hypo/hyperglycemia.;Provide education about proper nutrition, including hydration, and aerobic/resistive exercise prescription along with prescribed medications to achieve blood glucose in normal ranges: Fasting glucose 65-99 mg/dL    Expected Outcomes Short Term: Participant verbalizes understanding of the signs/symptoms and immediate care of hyper/hypoglycemia, proper foot care and importance of medication, aerobic/resistive exercise and nutrition plan for blood glucose control.;Long Term: Attainment of HbA1C < 7%.    Hypertension Yes    Intervention Provide education on lifestyle modifcations including regular physical activity/exercise, weight management, moderate sodium restriction and increased consumption of fresh fruit, vegetables, and low fat dairy, alcohol moderation, and smoking cessation.;Monitor prescription use compliance.    Expected Outcomes Short Term: Continued assessment and  intervention until BP is < 140/2mm HG in hypertensive participants. < 130/4mm HG in hypertensive participants with diabetes, heart failure or chronic kidney disease.;Long Term: Maintenance of blood pressure at goal levels.    Lipids Yes    Intervention Provide education and support for participant on nutrition & aerobic/resistive exercise along with prescribed medications to achieve LDL 70mg , HDL >40mg .    Expected Outcomes Short Term: Participant states understanding of desired cholesterol values and is compliant with medications prescribed. Participant is following exercise prescription and nutrition guidelines.;Long Term: Cholesterol controlled with  medications as prescribed, with individualized exercise RX and with personalized nutrition plan. Value goals: LDL < 70mg , HDL > 40 mg.          Education:Diabetes - Individual verbal and written instruction to review signs/symptoms of diabetes, desired ranges of glucose level fasting, after meals and with exercise. Acknowledge that pre and post exercise glucose checks will be done for 3 sessions at entry of program. Flowsheet Row Cardiac Rehab from 10/15/2023 in [facility] Cardiac and Pulmonary Rehab  Date 10/15/23  Educator MB  Instruction Review Code 1- Verbalizes Understanding  [Diet controlled]    Core Components/Risk Factors/Patient Goals Review:    Core Components/Risk Factors/Patient Goals at Discharge (Final Review):    ITP Comments:  ITP Comments     Row Name 09/12/23 1500 10/15/23 1157 10/16/23 0735 11/07/23 1005     ITP Comments Virtual Visit completed. Patient informed on EP and RD appointment and 6 Minute walk test. Patient also informed of patient health questionnaires on My Chart. Patient Verbalizes understanding. Visit diagnosis can be found in [facility] 12/11/[facility]. Completed and gym orientation for cardiac rehab. Initial ITP created and sent for review to Dr. Oneil Pinal, Medical Director. First full day of exercise!  Patient  was oriented to gym and equipment including functions, settings, policies, and procedures.  Patient's individual exercise prescription and treatment plan were reviewed.  All starting workloads were established based on the results of the 6 minute walk test done at initial orientation visit.  The plan for exercise progression was also introduced and progression will be customized based on patient's performance and goals. 30 Day review completed. Medical Director ITP review done, changes made as directed, and signed approval by Medical Director.       Comments: 30 day review

## 2023-11-08 ENCOUNTER — Encounter: Admitting: Emergency Medicine

## 2023-11-08 DIAGNOSIS — Z955 Presence of coronary angioplasty implant and graft: Secondary | ICD-10-CM

## 2023-11-08 DIAGNOSIS — I214 Non-ST elevation (NSTEMI) myocardial infarction: Secondary | ICD-10-CM

## 2023-11-08 NOTE — Progress Notes (Signed)
 Daily Session Note  Patient Details  Name: [name] MRN: [id number] Date of Birth: 11/25/1945 Referring Provider:   Flowsheet Row Cardiac Rehab from 10/15/2023 in [facility] Cardiac and Pulmonary Rehab  Referring Provider Darron Grass, MD    Encounter Date: 11/08/2023  Check In:  Session Check In - 11/08/23 9071       Check-In   Supervising physician immediately available to respond to emergencies See telemetry face sheet for immediately available ER MD    Location [facility]-Cardiac & Pulmonary Rehab    Staff Present Leita Franks RN,BSN;Joseph [facility] BS, Exercise Physiologist;Margaret Best, MS, Exercise Physiologist    Virtual Visit No    Medication changes reported     No    Fall or balance concerns reported    No    Warm-up and Cool-down Performed on first and last piece of equipment    Resistance Training Performed Yes    VAD Patient? No    PAD/SET Patient? No      Pain Assessment   Currently in Pain? No/denies             Social History   Tobacco Use  Smoking Status Former   Current packs/day: 0.00   Types: Cigarettes   Quit date: 01/16/1978   Years since quitting: 45.8  Smokeless Tobacco Never  Tobacco Comments   REMOTELY QUIT IN 1987 AFTER A FEW YEARS OF USE    Goals Met:  Independence with exercise equipment Exercise tolerated well No report of concerns or symptoms today Strength training completed today  Goals Unmet:  Not Applicable  Comments: Pt able to follow exercise prescription today without complaint.  Will continue to monitor for progression.    Dr. Oneil Pinal is Medical Director for [facility] Cardiac Rehabilitation.  Dr. Fuad Aleskerov is Medical Director for [facility] Pulmonary Rehabilitation.

## 2023-11-12 ENCOUNTER — Encounter

## 2023-11-12 DIAGNOSIS — I214 Non-ST elevation (NSTEMI) myocardial infarction: Secondary | ICD-10-CM | POA: Diagnosis not present

## 2023-11-12 DIAGNOSIS — Z955 Presence of coronary angioplasty implant and graft: Secondary | ICD-10-CM

## 2023-11-12 NOTE — Progress Notes (Signed)
 Daily Session Note  Patient Details  Name: [name] MRN: [id number] Date of Birth: 15-May-1945 Referring Provider:   Flowsheet Row Cardiac Rehab from 10/15/2023 in [facility] Cardiac and Pulmonary Rehab  Referring Provider Darron Grass, MD    Encounter Date: 11/12/2023  Check In:  Session Check In - 11/12/23 0926       Check-In   Supervising physician immediately available to respond to emergencies See telemetry face sheet for immediately available ER MD    Location [facility]-Cardiac & Pulmonary Rehab    Staff Present Burnard Davenport RN,BSN,MPA;Maxon Burnell BS, Exercise Physiologist;Joseph Rolinda RCP,RRT,BSRT;Laura Cates RN,BSN;Jaterrius Ricketson [place] BS, ACSM CEP, Exercise Physiologist    Virtual Visit No    Medication changes reported     No    Fall or balance concerns reported    No    Warm-up and Cool-down Performed on first and last piece of equipment    Resistance Training Performed Yes    VAD Patient? No    PAD/SET Patient? No      Pain Assessment   Currently in Pain? No/denies             Social History   Tobacco Use  Smoking Status Former   Current packs/day: 0.00   Types: Cigarettes   Quit date: 01/16/1978   Years since quitting: 45.8  Smokeless Tobacco Never  Tobacco Comments   REMOTELY QUIT IN 1987 AFTER A FEW YEARS OF USE    Goals Met:  Independence with exercise equipment Exercise tolerated well No report of concerns or symptoms today Strength training completed today  Goals Unmet:  Not Applicable  Comments: Pt able to follow exercise prescription today without complaint.  Will continue to monitor for progression.    Dr. Oneil Pinal is Medical Director for [facility] Cardiac Rehabilitation.  Dr. Fuad Aleskerov is Medical Director for [facility] Pulmonary Rehabilitation.

## 2023-11-14 ENCOUNTER — Encounter

## 2023-11-14 DIAGNOSIS — I214 Non-ST elevation (NSTEMI) myocardial infarction: Secondary | ICD-10-CM | POA: Diagnosis not present

## 2023-11-14 DIAGNOSIS — Z955 Presence of coronary angioplasty implant and graft: Secondary | ICD-10-CM

## 2023-11-14 NOTE — Progress Notes (Signed)
 Daily Session Note  Patient Details  Name: [name] MRN: [id number] Date of Birth: 1945/08/10 Referring Provider:   Flowsheet Row Cardiac Rehab from 10/15/2023 in [facility] Cardiac and Pulmonary Rehab  Referring Provider Darron Grass, MD    Encounter Date: 11/14/2023  Check In:  Session Check In - 11/14/23 0748       Check-In   Supervising physician immediately available to respond to emergencies See telemetry face sheet for immediately available ER MD    Location [facility]-Cardiac & Pulmonary Rehab    Staff Present Burnard Davenport RN,BSN,MPA;Joseph Rolinda NORWOOD HARMAN Verlie Laird, BS, Exercise Physiologist;Margaret Best, MS, Exercise Physiologist    Virtual Visit No    Medication changes reported     No    Fall or balance concerns reported    No    Warm-up and Cool-down Performed on first and last piece of equipment    Resistance Training Performed Yes    VAD Patient? No    PAD/SET Patient? No      Pain Assessment   Currently in Pain? No/denies             Social History   Tobacco Use  Smoking Status Former   Current packs/day: 0.00   Types: Cigarettes   Quit date: 01/16/1978   Years since quitting: 45.8  Smokeless Tobacco Never  Tobacco Comments   REMOTELY QUIT IN 1987 AFTER A FEW YEARS OF USE    Goals Met:  Independence with exercise equipment Exercise tolerated well No report of concerns or symptoms today Strength training completed today  Goals Unmet:  Not Applicable  Comments: Pt able to follow exercise prescription today without complaint.  Will continue to monitor for progression.    Dr. Oneil Pinal is Medical Director for [facility] Cardiac Rehabilitation.  Dr. Fuad Aleskerov is Medical Director for [facility] Pulmonary Rehabilitation.

## 2023-11-15 ENCOUNTER — Encounter

## 2023-11-19 ENCOUNTER — Encounter: Attending: Cardiovascular Disease | Admitting: *Deleted

## 2023-11-19 DIAGNOSIS — I214 Non-ST elevation (NSTEMI) myocardial infarction: Secondary | ICD-10-CM

## 2023-11-19 DIAGNOSIS — Z955 Presence of coronary angioplasty implant and graft: Secondary | ICD-10-CM | POA: Diagnosis present

## 2023-11-19 DIAGNOSIS — Z48812 Encounter for surgical aftercare following surgery on the circulatory system: Secondary | ICD-10-CM | POA: Diagnosis not present

## 2023-11-19 DIAGNOSIS — I252 Old myocardial infarction: Secondary | ICD-10-CM | POA: Insufficient documentation

## 2023-11-19 NOTE — Progress Notes (Signed)
 Daily Session Note  Patient Details  Name: [name] MRN: [id number] Date of Birth: 1945-12-13 Referring Provider:   Flowsheet Row Cardiac Rehab from 10/15/2023 in [facility] Cardiac and Pulmonary Rehab  Referring Provider Darron Grass, MD    Encounter Date: 11/19/2023  Check In:  Session Check In - 11/19/23 0928       Check-In   Supervising physician immediately available to respond to emergencies See telemetry face sheet for immediately available ER MD    Location [facility]-Cardiac & Pulmonary Rehab    Staff Present Othel Durand, RN, BSN, CCRP;Laura Cates RN,BSN;Joseph Hood RCP,RRT,BSRT;Kelly [place] BS, ACSM CEP, Exercise Physiologist    Virtual Visit No    Medication changes reported     No    Fall or balance concerns reported    No    Warm-up and Cool-down Performed on first and last piece of equipment    Resistance Training Performed Yes    VAD Patient? No    PAD/SET Patient? No      Pain Assessment   Currently in Pain? No/denies             Social History   Tobacco Use  Smoking Status Former   Current packs/day: 0.00   Types: Cigarettes   Quit date: 01/16/1978   Years since quitting: 45.8  Smokeless Tobacco Never  Tobacco Comments   REMOTELY QUIT IN 1987 AFTER A FEW YEARS OF USE    Goals Met:  Independence with exercise equipment Exercise tolerated well No report of concerns or symptoms today  Goals Unmet:  Not Applicable  Comments: Pt able to follow exercise prescription today without complaint.  Will continue to monitor for progression.    Dr. Oneil Pinal is Medical Director for [facility] Cardiac Rehabilitation.  Dr. Fuad Aleskerov is Medical Director for [facility] Pulmonary Rehabilitation.

## 2023-11-22 ENCOUNTER — Encounter: Admitting: Emergency Medicine

## 2023-11-22 DIAGNOSIS — I214 Non-ST elevation (NSTEMI) myocardial infarction: Secondary | ICD-10-CM

## 2023-11-22 DIAGNOSIS — Z955 Presence of coronary angioplasty implant and graft: Secondary | ICD-10-CM

## 2023-11-22 NOTE — Progress Notes (Signed)
 Daily Session Note  Patient Details  Name: [name] MRN: [id number] Date of Birth: 06-16-1945 Referring Provider:   Flowsheet Row Cardiac Rehab from 10/15/2023 in [facility] Cardiac and Pulmonary Rehab  Referring Provider Darron Grass, MD    Encounter Date: 11/22/2023  Check In:  Session Check In - 11/22/23 0944       Check-In   Supervising physician immediately available to respond to emergencies See telemetry face sheet for immediately available ER MD    Location [facility]-Cardiac & Pulmonary Rehab    Staff Present Leita Franks RN,BSN;Joseph [facility] BS, Exercise Physiologist;Mary Peggi, RN, DNP, NE-BC    Virtual Visit No    Medication changes reported     No    Fall or balance concerns reported    No    Warm-up and Cool-down Performed on first and last piece of equipment    Resistance Training Performed Yes    VAD Patient? No    PAD/SET Patient? No      Pain Assessment   Currently in Pain? No/denies             Social History   Tobacco Use  Smoking Status Former   Current packs/day: 0.00   Types: Cigarettes   Quit date: 01/16/1978   Years since quitting: 45.8  Smokeless Tobacco Never  Tobacco Comments   REMOTELY QUIT IN 1987 AFTER A FEW YEARS OF USE    Goals Met:  Independence with exercise equipment Exercise tolerated well No report of concerns or symptoms today Strength training completed today  Goals Unmet:  Not Applicable  Comments: Pt able to follow exercise prescription today without complaint.  Will continue to monitor for progression.    Dr. Oneil Pinal is Medical Director for [facility] Cardiac Rehabilitation.  Dr. Fuad Aleskerov is Medical Director for [facility] Pulmonary Rehabilitation.

## 2023-11-24 ENCOUNTER — Other Ambulatory Visit: Payer: Self-pay

## 2023-11-26 ENCOUNTER — Encounter

## 2023-11-26 DIAGNOSIS — I214 Non-ST elevation (NSTEMI) myocardial infarction: Secondary | ICD-10-CM

## 2023-11-26 DIAGNOSIS — Z955 Presence of coronary angioplasty implant and graft: Secondary | ICD-10-CM | POA: Diagnosis not present

## 2023-11-26 NOTE — Progress Notes (Signed)
 Daily Session Note  Patient Details  Name: RAIGAN BARIA MRN: [id number] Date of Birth: 10-Nov-1945 Referring Provider:   Flowsheet Row Cardiac Rehab from 10/15/2023 in [facility] Cardiac and Pulmonary Rehab  Referring Provider Darron Grass, MD    Encounter Date: 11/26/2023  Check In:  Session Check In - 11/26/23 0914       Check-In   Supervising physician immediately available to respond to emergencies See telemetry face sheet for immediately available ER MD    Location [facility]-Cardiac & Pulmonary Rehab    Staff Present Burnard Hint BS, ACSM CEP, Exercise Physiologist;Maxon Burnell HECKLE, Exercise Physiologist;Joseph Hood RCP,RRT,BSRT;Alayasia Breeding RN,BSN,MPA;Mary Peggi, RN, DNP, NE-BC    Virtual Visit No    Medication changes reported     No    Fall or balance concerns reported    No    Warm-up and Cool-down Performed on first and last piece of equipment    Resistance Training Performed Yes    VAD Patient? No    PAD/SET Patient? No      Pain Assessment   Currently in Pain? No/denies             Social History   Tobacco Use  Smoking Status Former   Current packs/day: 0.00   Types: Cigarettes   Quit date: 01/16/1978   Years since quitting: 45.8  Smokeless Tobacco Never  Tobacco Comments   REMOTELY QUIT IN 1987 AFTER A FEW YEARS OF USE    Goals Met:  Independence with exercise equipment Exercise tolerated well No report of concerns or symptoms today Strength training completed today  Goals Unmet:  Not Applicable  Comments: Pt able to follow exercise prescription today without complaint.  Will continue to monitor for progression.    Dr. Oneil Pinal is Medical Director for [facility] Cardiac Rehabilitation.  Dr. Fuad Aleskerov is Medical Director for [facility] Pulmonary Rehabilitation.

## 2023-11-27 ENCOUNTER — Encounter: Payer: Self-pay | Admitting: Cardiovascular Disease

## 2023-11-27 ENCOUNTER — Ambulatory Visit: Attending: Cardiovascular Disease | Admitting: Cardiovascular Disease

## 2023-11-27 VITALS — BP 120/78 | HR 74 | Ht 66.0 in | Wt 198.1 lb

## 2023-11-27 DIAGNOSIS — I25118 Atherosclerotic heart disease of native coronary artery with other forms of angina pectoris: Secondary | ICD-10-CM

## 2023-11-27 DIAGNOSIS — E785 Hyperlipidemia, unspecified: Secondary | ICD-10-CM | POA: Diagnosis not present

## 2023-11-27 DIAGNOSIS — I5022 Chronic systolic (congestive) heart failure: Secondary | ICD-10-CM | POA: Diagnosis not present

## 2023-11-27 NOTE — Progress Notes (Signed)
 Cardiology Office Note   Date:  11/27/2023   ID:  [name], Casciano May 04, 1945, MRN [id number]  PCP:  Maria Verneita CROME, MD  Cardiologist:   Maria Cage, MD   Chief Complaint  Patient presents with   Follow-up    3 month f/u c/o pt would like to discuss fatigue, weakness and no energy and stent hx. Meds reviewed verbally with pt.      History of Present Illness: [name] is a 78 y.o. female who presents for a follow-up visit regarding coronary artery disease and chronic diastolic heart failure. Has chronic medical conditions that include asthma, obstructive sleep apnea on CPAP, psoriasis, diabetes mellitus, essential hypertension and anxiety.  She was hospitalized in November 2024 with non-STEMI.  Cardiac catheterization showed moderately calcified coronary arteries with severe two-vessel coronary artery disease.  The culprit was plaque rupture in OM1 and mid left circumflex which were treated with bifurcation angioplasty and kissing stent placement to OM1/left mid left circumflex using 2 drug-eluting stents. Echo showed an EF of 35 to 40%.  She underwent staged RCA PCI in late November.  She had myalgia on rosuvastatin  which was discontinued.  Follow-up echocardiogram in December showed an EF of 45 to 50% with mild MR. She was hospitalized in January 2025 with GI bleed.  CT was suggestive of diverticulitis.  She required transfusion and holding ticagrelor .  She was switched from ticagrelor  to clopidogrel .  She continues to have vague symptoms of shortness of breath and fatigue.  She did not have chest pain with her prior myocardial infarction.  She does suffer from chronic back pain.  She has been attending cardiac rehab and reports gradual improvement in symptoms.      Past Medical History:  Diagnosis Date   Acute GI bleeding 02/08/2023   Angina pectoris 04/14/2015   Asthma    Asthma due to environmental allergies    grass, mold, trees, dust   Cataract    Cellulitis     LEFT LEG   Diabetes mellitus    CONTROLLED ON DIET ALONE   Diverticulitis of colon 02/09/2023   Diverticulitis of intestine with bleeding 02/08/2023   Edema leg    LEFT LEG...CHRONIC   Endometrial hyperplasia    External hemorrhoid    Fatty liver    Glaucoma    Hepatic cyst    STABLE PER 05/20/2008 ULTRASOUND   Hypertension    borderline...controlled since taking herself of HCTZ IN JULY   IBS (irritable bowel syndrome)    Lactic acidosis 02/09/2023   Murmur, cardiac    Neuropathy    Numbness of fingers of both hands 04/17/2015   OSA on CPAP    Osteoarthritis    Psoriasis    Sleep apnea    CPAP    Past Surgical History:  Procedure Laterality Date   BONE DENSITY TEST  01/17/2004   CATARACT EXTRACTION, BILATERAL Bilateral    May 2023 and June 2023   CORONARY STENT INTERVENTION N/A 11/20/2022   Procedure: CORONARY STENT INTERVENTION;  Surgeon: Fletcher Maria LABOR, MD;  Location: [facility];  Service: Cardiovascular;  Laterality: N/A;   CORONARY STENT INTERVENTION Right 12/11/2022   Procedure: CORONARY STENT INTERVENTION;  Surgeon: Fletcher Maria LABOR, MD;  Location: [facility];  Service: Cardiovascular;  Laterality: Right;   HYSTEROSCOPY WITH D & C  01/16/2005   BC OF ABNORAL PAP AND ENDOMETRIAL HYPERPLASIA   IR ANGIOGRAM VISCERAL SELECTIVE  02/11/2023   LEFT HEART CATH  AND CORONARY ANGIOGRAPHY N/A 11/20/2022   Procedure: LEFT HEART CATH AND CORONARY ANGIOGRAPHY;  Surgeon: Darron Maria LABOR, MD;  Location: [facility];  Service: Cardiovascular;  Laterality: N/A;   LEFT HEART CATH AND CORONARY ANGIOGRAPHY N/A 12/11/2022   Procedure: LEFT HEART CATH AND CORONARY ANGIOGRAPHY;  Surgeon: Darron Maria LABOR, MD;  Location: [facility];  Service: Cardiovascular;  Laterality: N/A;   ONE VAGINAL DELIVERY     UMBILICAL HERNIA REPAIR  01/17/2008   VAGINAL HYSTERECTOMY  01/17/2008     Current Outpatient Medications  Medication Sig Dispense Refill    albuterol  (VENTOLIN  HFA) 108 (90 Base) MCG/ACT inhaler Inhale 1-2 puffs into the lungs every 4-6 hours as needed for cough/wheeze. 18 g 0   ALPRAZolam  (XANAX ) 0.25 MG tablet Take 1 tablet (0.25 mg total) by mouth 2 (two) times daily as needed for anxiety. 30 tablet 0   bimatoprost  (LUMIGAN ) 0.01 % SOLN Place 1 drop into both eyes at bedtime. 2.5 mL 5   budesonide  (PULMICORT ) 180 MCG/ACT inhaler Inhale 2 puffs into the lungs 2 (two) times daily.     carvedilol  (COREG ) 3.125 MG tablet Take 1 tablet (3.125 mg total) by mouth 2 (two) times daily with a meal. 180 tablet 3   cetirizine (ZYRTEC) 10 MG tablet Take 10 mg by mouth daily.     clopidogrel  (PLAVIX ) 75 MG tablet Take 1 tablet (75 mg total) by mouth daily. 90 tablet 3   EPINEPHrine 0.3 mg/0.3 mL IJ SOAJ injection as needed.     ezetimibe  (ZETIA ) 10 MG tablet Take 1 tablet (10 mg total) by mouth daily. (Patient taking differently: Take 10 mg by mouth every other day.) 90 tablet 3   MILK THISTLE PO Take 1 capsule by mouth daily.      nitroGLYCERIN  (NITROSTAT ) 0.4 MG SL tablet Place 1 tablet (0.4 mg total) under the tongue every 5 (five) minutes for up to 3 doses as needed for chest pain. 25 tablet 0   polyethylene glycol (MIRALAX  / GLYCOLAX ) 17 g packet Take 17 g by mouth daily as needed for mild constipation. 14 each 0   Probiotic Product (PROBIOTIC COMPLEX ACIDOPHILUS PO) Take 1 capsule by mouth daily.     rosuvastatin  (CRESTOR ) 5 MG tablet Take 1 tablet (5 mg total) by mouth daily. (Patient taking differently: Take 5 mg by mouth every other day.) 90 tablet 3   sodium chloride  (OCEAN) 0.65 % SOLN nasal spray Place 1 spray into the nose as needed.     No current facility-administered medications for this visit.    Allergies:   Meloxicam , Nsaids, Statins, Sulfa drugs cross reactors, and Zostavax [zoster vaccine live]    Social History:  The patient  reports that she quit smoking about 45 years ago. Her smoking use included cigarettes. She has  never used smokeless tobacco. She reports that she does not drink alcohol and does not use drugs.   Family History:  The patient's family history includes Heart disease (age of onset: 10) in her father; Mental illness in her mother.    ROS:  Please see the history of present illness.   Otherwise, review of systems are positive for none.   All other systems are reviewed and negative.    PHYSICAL EXAM: VS:  BP 120/78 (BP Location: Left Arm, Patient Position: Sitting, Cuff Size: Large)   Pulse 74   Ht 5' 6 (1.676 m)   Wt 198 lb 2 oz (89.9 kg)   SpO2 98%  BMI 31.98 kg/m  , BMI Body mass index is 31.98 kg/m. GEN: Well nourished, well developed, in no acute distress  HEENT: normal  Neck: no JVD, carotid bruits, or masses Cardiac: RRR; no murmurs, rubs, or gallops,no edema  Respiratory:  clear to auscultation bilaterally, normal work of breathing GI: soft, nontender, nondistended, + BS MS: no deformity or atrophy  Skin: warm and dry, no rash Neuro:  Strength and sensation are intact Psych: euthymic mood, full affect   EKG:  EKG is not ordered today.   Recent Labs: 02/08/2023: B Natriuretic Peptide 150.3 02/12/2023: Magnesium 2.4 03/05/2023: Hemoglobin 13.3; Platelets 315.0 09/11/2023: ALT 10; BUN 11; Creatinine, Ser 0.97; Potassium 4.5; Sodium 140    Lipid Panel    Component Value Date/Time   CHOL 177 09/11/2023 0802   TRIG 162 (H) 09/11/2023 0802   HDL 46 (L) 09/11/2023 0802   CHOLHDL 3.8 09/11/2023 0802   VLDL 30.2 06/14/2023 0924   LDLCALC 103 (H) 09/11/2023 0802   LDLDIRECT 157.0 06/14/2023 0924      Wt Readings from Last 3 Encounters:  11/27/23 198 lb 2 oz (89.9 kg)  10/15/23 192 lb 8 oz (87.3 kg)  09/18/23 186 lb 6.4 oz (84.6 kg)           No data to display            ASSESSMENT AND PLAN:  1.  Coronary artery disease involving native coronary arteries with other forms of angina: Her symptoms with myocardial infarction were very atypical overall  with no chest pain.  She did have jaw pain radiating to left arm.  Currently she reports no recurrent similar symptoms but does report persistent exertional dyspnea and fatigue.  The symptoms all started after her myocardial infarction.  I do think she became deconditioned after 2 cardiac stent procedures and then severe blood loss anemia.  It might be taking her longer time to recover.  She is attending cardiac rehab and making reasonable progress. I did offer to evaluate for cardiac ischemia with a Lexiscan  Myoview  but she prefers to wait. She is going for a physical in December and will get routine labs done.  2.  Hyperlipidemia: Currently on small dose rosuvastatin  and ezetimibe .  Her most recent LDL was 103 but we will see what her LDL is with next labs.  3.  Chronic systolic heart failure: Mildly reduced LV systolic function with slight improvement on most recent echocardiogram.  Continue small dose Coreg .  Blood pressure is too low to allow addition of ACE inhibitor or ARB.  No evidence of volume overload.    Disposition:   FU with me in 6 months  Signed,  Maria Cage, MD  11/27/2023 3:31 PM    [facility]

## 2023-11-27 NOTE — Patient Instructions (Signed)

## 2023-11-29 ENCOUNTER — Encounter: Admitting: Emergency Medicine

## 2023-11-29 DIAGNOSIS — I214 Non-ST elevation (NSTEMI) myocardial infarction: Secondary | ICD-10-CM

## 2023-11-29 DIAGNOSIS — Z955 Presence of coronary angioplasty implant and graft: Secondary | ICD-10-CM | POA: Diagnosis not present

## 2023-11-29 NOTE — Progress Notes (Signed)
 Daily Session Note  Patient Details  Name: [name] MRN: [id number] Date of Birth: June 17, 1945 Referring Provider:   Flowsheet Row Cardiac Rehab from 10/15/2023 in [facility]  Referring Provider Darron Grass, MD    Encounter Date: 11/29/2023  Check In:  Session Check In - 11/29/23 0934       Check-In   Supervising physician immediately available to respond to emergencies See telemetry face sheet for immediately available ER MD    Location [facility]-Cardiac & Pulmonary Rehab    Staff Present Leita Franks RN,BSN;Joseph [facility] BS, Exercise Physiologist;Noah Tickle, BS, Exercise Physiologist    Virtual Visit No    Medication changes reported     No    Fall or balance concerns reported    No    Warm-up and Cool-down Performed on first and last piece of equipment    Resistance Training Performed Yes    VAD Patient? No    PAD/SET Patient? No      Pain Assessment   Currently in Pain? No/denies             Social History   Tobacco Use  Smoking Status Former   Current packs/day: 0.00   Types: Cigarettes   Quit date: 01/16/1978   Years since quitting: 45.8  Smokeless Tobacco Never  Tobacco Comments   REMOTELY QUIT IN 1987 AFTER A FEW YEARS OF USE    Goals Met:  Independence with exercise equipment Exercise tolerated well No report of concerns or symptoms today Strength training completed today  Goals Unmet:  Not Applicable  Comments: Pt able to follow exercise prescription today without complaint.  Will continue to monitor for progression.    Dr. Oneil Pinal is Medical Director for [facility] Cardiac Rehabilitation.  Dr. Fuad Aleskerov is Medical Director for [facility] Pulmonary Rehabilitation.

## 2023-12-03 ENCOUNTER — Encounter

## 2023-12-03 DIAGNOSIS — Z955 Presence of coronary angioplasty implant and graft: Secondary | ICD-10-CM | POA: Diagnosis not present

## 2023-12-03 DIAGNOSIS — I214 Non-ST elevation (NSTEMI) myocardial infarction: Secondary | ICD-10-CM

## 2023-12-03 NOTE — Progress Notes (Signed)
 Daily Session Note  Patient Details  Name: [name] MRN: [id number] Date of Birth: 1945/03/16 Referring Provider:   Flowsheet Row Cardiac Rehab from 10/15/2023 in [facility] Pulmonary Re[facility]  Referring Provider Darron Grass, MD    Encounter Date: 12/03/2023  Check In:  Session Check In - 12/03/23 0920       Check-In   Supervising physician immediately available to respond to emergencies See telemetry face sheet for immediately available ER MD    Location [facility]    Staff Present Burnard Davenport RN,BSN,MPA;Joseph Rolinda RCP,RRT,BSRT;Laura Cates RN,BSN;Troyce Gieske Dyane BS, ACSM CEP, Exercise Physiologist    Virtual Visit No    Medication changes reported     No    Fall or balance concerns reported    No    Warm-up and Cool-down Performed on first and last piece of equipment    Resistance Training Performed Yes    VAD Patient? No    PAD/SET Patient? No      Pain Assessment   Currently in Pain? No/denies             Social History   Tobacco Use  Smoking Status Former   Current packs/day: 0.00   Types: Cigarettes   Quit date: 01/16/1978   Years since quitting: 45.9  Smokeless Tobacco Never  Tobacco Comments   REMOTELY QUIT IN 1987 AFTER A FEW YEARS OF USE    Goals Met:  Independence with exercise equipment Exercise tolerated well No report of concerns or symptoms today Strength training completed today  Goals Unmet:  Not Applicable  Comments: Pt able to follow exercise prescription today without complaint.  Will continue to monitor for progression.    Dr. Oneil Pinal is Medical Director for [facility] Cardiac Rehabilitation.  Dr. Fuad Aleskerov is Medical Director for [facility] Pulmonary Rehabilitation.

## 2023-12-05 DIAGNOSIS — I214 Non-ST elevation (NSTEMI) myocardial infarction: Secondary | ICD-10-CM

## 2023-12-05 NOTE — Progress Notes (Signed)
 Cardiac Individual Treatment Plan  Patient Details  Name: [name] MRN: [id number] Date of Birth: 12/28/1945 Referring Provider:   Flowsheet Row Cardiac Rehab from 10/15/2023 in [facility] Cardiac and Pulmonary Rehab  Referring Provider Darron Grass, MD    Initial Encounter Date:  Flowsheet Row Cardiac Rehab from 10/15/2023 in [facility] Cardiac and Pulmonary Rehab  Date 10/15/23    Visit Diagnosis: NSTEMI (non-ST elevation myocardial infarction) [facility])  Patient's Home Medications on Admission:  Current Outpatient Medications:    albuterol  (VENTOLIN  HFA) 108 (90 Base) MCG/ACT inhaler, Inhale 1-2 puffs into the lungs every 4-6 hours as needed for cough/wheeze., Disp: 18 g, Rfl: 0   ALPRAZolam  (XANAX ) 0.25 MG tablet, Take 1 tablet (0.25 mg total) by mouth 2 (two) times daily as needed for anxiety., Disp: 30 tablet, Rfl: 0   bimatoprost  (LUMIGAN ) 0.01 % SOLN, Place 1 drop into both eyes at bedtime., Disp: 2.5 mL, Rfl: 5   budesonide  (PULMICORT ) 180 MCG/ACT inhaler, Inhale 2 puffs into the lungs 2 (two) times daily., Disp: , Rfl:    carvedilol  (COREG ) 3.125 MG tablet, Take 1 tablet (3.125 mg total) by mouth 2 (two) times daily with a meal., Disp: 180 tablet, Rfl: 3   cetirizine (ZYRTEC) 10 MG tablet, Take 10 mg by mouth daily., Disp: , Rfl:    clopidogrel  (PLAVIX ) 75 MG tablet, Take 1 tablet (75 mg total) by mouth daily., Disp: 90 tablet, Rfl: 3   EPINEPHrine 0.3 mg/0.3 mL IJ SOAJ injection, as needed., Disp: , Rfl:    ezetimibe  (ZETIA ) 10 MG tablet, Take 1 tablet (10 mg total) by mouth daily. (Patient taking differently: Take 10 mg by mouth every other day.), Disp: 90 tablet, Rfl: 3   MILK THISTLE PO, Take 1 capsule by mouth daily. , Disp: , Rfl:    nitroGLYCERIN  (NITROSTAT ) 0.4 MG SL tablet, Place 1 tablet (0.4 mg total) under the tongue every 5 (five) minutes for up to 3 doses as needed for chest pain., Disp: 25 tablet, Rfl: 0   polyethylene glycol (MIRALAX  / GLYCOLAX ) 17 g packet, Take 17 g by  mouth daily as needed for mild constipation., Disp: 14 each, Rfl: 0   Probiotic Product (PROBIOTIC COMPLEX ACIDOPHILUS PO), Take 1 capsule by mouth daily., Disp: , Rfl:    rosuvastatin  (CRESTOR ) 5 MG tablet, Take 1 tablet (5 mg total) by mouth daily. (Patient taking differently: Take 5 mg by mouth every other day.), Disp: 90 tablet, Rfl: 3   sodium chloride  (OCEAN) 0.65 % SOLN nasal spray, Place 1 spray into the nose as needed., Disp: , Rfl:   Past Medical History: Past Medical History:  Diagnosis Date   Acute GI bleeding 02/08/2023   Angina pectoris 04/14/2015   Asthma    Asthma due to environmental allergies    grass, mold, trees, dust   Cataract    Cellulitis    LEFT LEG   Diabetes mellitus    CONTROLLED ON DIET ALONE   Diverticulitis of colon 02/09/2023   Diverticulitis of intestine with bleeding 02/08/2023   Edema leg    LEFT LEG...CHRONIC   Endometrial hyperplasia    External hemorrhoid    Fatty liver    Glaucoma    Hepatic cyst    STABLE PER 05/20/2008 ULTRASOUND   Hypertension    borderline...controlled since taking herself of HCTZ IN JULY   IBS (irritable bowel syndrome)    Lactic acidosis 02/09/2023   Murmur, cardiac    Neuropathy    Numbness of fingers of  both hands 04/17/2015   OSA on CPAP    Osteoarthritis    Psoriasis    Sleep apnea    CPAP    Tobacco Use: Social History   Tobacco Use  Smoking Status Former   Current packs/day: 0.00   Types: Cigarettes   Quit date: 01/16/1978   Years since quitting: 45.9  Smokeless Tobacco Never  Tobacco Comments   REMOTELY QUIT IN 1987 AFTER A FEW YEARS OF USE    Labs: Review Flowsheet  More data exists      Latest Ref Rng & Units 11/13/2022 02/09/2023 06/14/2023 09/11/2023 09/18/2023  Labs for ITP Cardiac and Pulmonary Rehab  Cholestrol <200 mg/dL 810  - 780  822  -  LDL (calc) mg/dL (calc) 891  - 860  896  -  Direct LDL mg/dL 872.9  - 842.9  - -  HDL-C > OR = 50 mg/dL 48.59  - 49.79  46  -  Trlycerides <150  mg/dL 849.9  - 848.9  837  -  Hemoglobin A1c 4.0 - 5.6 % 7.1  6.2  6.2  - 5.8      Exercise Target Goals: Exercise Program Goal: Individual exercise prescription set using results from initial 6 min walk test and THRR while considering  patient's activity barriers and safety.   Exercise Prescription Goal: Initial exercise prescription builds to 30-45 minutes a day of aerobic activity, 2-3 days per week.  Home exercise guidelines will be given to patient during program as part of exercise prescription that the participant will acknowledge.   Education: Aerobic Exercise: - Group verbal and visual presentation on the components of exercise prescription. Introduces F.I.T.T principle from ACSM for exercise prescriptions.  Reviews F.I.T.T. principles of aerobic exercise including progression. Written material provided at class time. Flowsheet Row Cardiac Rehab from 11/14/2023 in [facility] Cardiac and Pulmonary Rehab  Education need identified 10/15/23    Education: Resistance Exercise: - Group verbal and visual presentation on the components of exercise prescription. Introduces F.I.T.T principle from ACSM for exercise prescriptions  Reviews F.I.T.T. principles of resistance exercise including progression. Written material provided at class time.    Education: Exercise & Equipment Safety: - Individual verbal instruction and demonstration of equipment use and safety with use of the equipment. Flowsheet Row Cardiac Rehab from 11/14/2023 in [facility] Cardiac and Pulmonary Rehab  Date 10/15/23  Educator MB  Instruction Review Code 1- Verbalizes Understanding    Education: Exercise Physiology & General Exercise Guidelines: - Group verbal and written instruction with models to review the exercise physiology of the cardiovascular system and associated critical values. Provides general exercise guidelines with specific guidelines to those with heart or lung disease. Written material provided at class  time.   Education: Flexibility, Balance, Mind/Body Relaxation: - Group verbal and visual presentation with interactive activity on the components of exercise prescription. Introduces F.I.T.T principle from ACSM for exercise prescriptions. Reviews F.I.T.T. principles of flexibility and balance exercise training including progression. Also discusses the mind body connection.  Reviews various relaxation techniques to help reduce and manage stress (i.e. Deep breathing, progressive muscle relaxation, and visualization). Balance handout provided to take home. Written material provided at class time.   Activity Barriers & Risk Stratification:  Activity Barriers & Cardiac Risk Stratification - 10/15/23 1158       Activity Barriers & Cardiac Risk Stratification   Activity Barriers Arthritis;Other (comment);Deconditioning    Comments Neuropathy    Cardiac Risk Stratification Moderate          6  Minute Walk:  6 Minute Walk     Row Name 10/15/23 1157         6 Minute Walk   Phase Initial     Distance 1070 feet     Walk Time 6 minutes     # of Rest Breaks 0     MPH 2.03     METS 2.02     RPE 15     Perceived Dyspnea  2     VO2 Peak 7.06     Symptoms Yes (comment)     Comments SOB     Resting HR 68 bpm     Resting BP 116/74     Resting Oxygen Saturation  97 %     Exercise Oxygen Saturation  during 6 min walk 100 %     Max Ex. HR 109 bpm     Max Ex. BP 144/70     2 Minute Post BP 110/64        Oxygen Initial Assessment:   Oxygen Re-Evaluation:   Oxygen Discharge (Final Oxygen Re-Evaluation):   Initial Exercise Prescription:  Initial Exercise Prescription - 10/15/23 1200       Date of Initial Exercise RX and Referring Provider   Date 10/15/23    Referring Provider Darron Grass, MD      Oxygen   Maintain Oxygen Saturation 88% or higher      Recumbant Bike   Level 1    RPM 5    Watts 15    Minutes 15    METs 2.02      NuStep   Level 1    SPM 80     Minutes 15    METs 2.02      Arm Ergometer   Level 1    RPM 25    Minutes 15    METs 2.02      Biostep-RELP   Level 1    SPM 50    Minutes 15    METs 2.02      Track   Laps 18    Minutes 15    METs 1.98      Prescription Details   Frequency (times per week) 2    Duration Progress to 30 minutes of continuous aerobic without signs/symptoms of physical distress      Intensity   THRR 40-80% of Max Heartrate 97-126    Ratings of Perceived Exertion 11-13    Perceived Dyspnea 0-4      Progression   Progression Continue to progress workloads to maintain intensity without signs/symptoms of physical distress.      Resistance Training   Training Prescription Yes    Weight 2 lb    Reps 10-15          Perform Capillary Blood Glucose checks as needed.  Exercise Prescription Changes:   Exercise Prescription Changes     Row Name 10/15/23 1200 10/22/23 1100 11/09/23 0700 11/12/23 1000 11/22/23 1600     Response to Exercise   Blood Pressure (Admit) 116/74 108/72 128/72 -- 102/60   Blood Pressure (Exercise) 144/70 140/70 142/72 -- 130/80   Blood Pressure (Exit) 110/64 120/70 104/62 -- 118/80   Heart Rate (Admit) 68 bpm 74 bpm 85 bpm -- 72 bpm   Heart Rate (Exercise) 109 bpm 98 bpm 116 bpm -- 120 bpm   Heart Rate (Exit) 77 bpm 79 bpm 83 bpm -- 86 bpm   Oxygen Saturation (Admit) 97 % -- -- -- --  Oxygen Saturation (Exercise) 100 % -- -- -- --   Oxygen Saturation (Exit) 97 % -- -- -- --   Rating of Perceived Exertion (Exercise) 15 15 15  -- 15   Perceived Dyspnea (Exercise) 2 -- -- -- --   Symptoms SOB none none -- none   Comments results first 2 weeks of exercise -- -- --   Duration -- Progress to 30 minutes of  aerobic without signs/symptoms of physical distress Continue with 30 min of aerobic exercise without signs/symptoms of physical distress. -- Continue with 30 min of aerobic exercise without signs/symptoms of physical distress.   Intensity -- THRR unchanged THRR  unchanged -- THRR unchanged     Progression   Progression -- Continue to progress workloads to maintain intensity without signs/symptoms of physical distress. Continue to progress workloads to maintain intensity without signs/symptoms of physical distress. -- Continue to progress workloads to maintain intensity without signs/symptoms of physical distress.   Average METs 2.02 2.02 2.07 -- 2.05     Resistance Training   Training Prescription -- Yes Yes -- Yes   Weight -- 2 lb 2 lb -- 2 lb   Reps -- 10-15 10-15 -- 10-15     Interval Training   Interval Training -- No No -- No     Recumbant Bike   Level -- 1.2 3 -- 4   Watts -- 15 25 -- 32   Minutes -- 15 15 -- 15   METs -- 2.54 2.53 -- 2.53     NuStep   Level -- 1 3 -- 4   Minutes -- 15 15 -- 15   METs -- 1.5 2 -- 2     Track   Laps -- -- 24 -- 11   Minutes -- -- 15 -- 15   METs -- -- 2.31 -- 1.6     Home Exercise Plan   Plans to continue exercise at -- -- -- Home (comment)  [name] plans to walk outside in her neighborhood with her husband, it is about a 2 mile loop. Home (comment)  [name] plans to walk outside in her neighborhood with her husband, it is about a 2 mile loop.   Frequency -- -- -- Add 3 additional days to program exercise sessions. Add 3 additional days to program exercise sessions.   Initial Home Exercises Provided -- -- -- 11/12/23 11/12/23     Oxygen   Maintain Oxygen Saturation -- 88% or higher 88% or higher 88% or higher 88% or higher      Exercise Comments:   Exercise Comments     Row Name 10/16/23 0735           Exercise Comments First full day of exercise!  Patient was oriented to gym and equipment including functions, settings, policies, and procedures.  Patient's individual exercise prescription and treatment plan were reviewed.  All starting workloads were established based on the results of the 6 minute walk test done at initial orientation visit.  The plan for exercise progression was also  introduced and progression will be customized based on patient's performance and goals.          Exercise Goals and Review:   Exercise Goals     Row Name 10/15/23 1203             Exercise Goals   Increase Physical Activity Yes       Intervention Provide advice, education, support and counseling about physical activity/exercise needs.;Develop an individualized exercise prescription for  aerobic and resistive training based on initial evaluation findings, risk stratification, comorbidities and participant's personal goals.       Expected Outcomes Short Term: Attend rehab on a regular basis to increase amount of physical activity.;Long Term: Add in home exercise to make exercise part of routine and to increase amount of physical activity.;Long Term: Exercising regularly at least 3-5 days a week.       Increase Strength and Stamina Yes       Intervention Provide advice, education, support and counseling about physical activity/exercise needs.;Develop an individualized exercise prescription for aerobic and resistive training based on initial evaluation findings, risk stratification, comorbidities and participant's personal goals.       Expected Outcomes Short Term: Increase workloads from initial exercise prescription for resistance, speed, and METs.;Short Term: Perform resistance training exercises routinely during rehab and add in resistance training at home;Long Term: Improve cardiorespiratory fitness, muscular endurance and strength as measured by increased METs and functional capacity ( )       Able to understand and use rate of perceived exertion (RPE) scale Yes       Intervention Provide education and explanation on how to use RPE scale       Expected Outcomes Short Term: Able to use RPE daily in rehab to express subjective intensity level;Long Term:  Able to use RPE to guide intensity level when exercising independently       Able to understand and use Dyspnea scale Yes        Intervention Provide education and explanation on how to use Dyspnea scale       Expected Outcomes Short Term: Able to use Dyspnea scale daily in rehab to express subjective sense of shortness of breath during exertion;Long Term: Able to use Dyspnea scale to guide intensity level when exercising independently       Knowledge and understanding of Target Heart Rate Range (THRR) Yes       Intervention Provide education and explanation of THRR including how the numbers were predicted and where they are located for reference       Expected Outcomes Short Term: Able to state/look up THRR;Short Term: Able to use daily as guideline for intensity in rehab;Long Term: Able to use THRR to govern intensity when exercising independently       Able to check pulse independently Yes       Intervention Provide education and demonstration on how to check pulse in carotid and radial arteries.;Review the importance of being able to check your own pulse for safety during independent exercise       Expected Outcomes Short Term: Able to explain why pulse checking is important during independent exercise;Long Term: Able to check pulse independently and accurately       Understanding of Exercise Prescription Yes       Intervention Provide education, explanation, and written materials on patient's individual exercise prescription       Expected Outcomes Short Term: Able to explain program exercise prescription;Long Term: Able to explain home exercise prescription to exercise independently          Exercise Goals Re-Evaluation :  Exercise Goals Re-Evaluation     Row Name 10/16/23 0735 10/22/23 1153 11/09/23 0758 11/12/23 1049 11/22/23 1623     Exercise Goal Re-Evaluation   Exercise Goals Review Increase Physical Activity;Understanding of Exercise Prescription;Knowledge and understanding of Target Heart Rate Range (THRR);Able to understand and use rate of perceived exertion (RPE) scale;Increase Strength and Stamina;Able to  understand and use Dyspnea scale;Able  to check pulse independently Increase Physical Activity;Increase Strength and Stamina;Understanding of Exercise Prescription Increase Physical Activity;Increase Strength and Stamina;Understanding of Exercise Prescription Increase Physical Activity;Increase Strength and Stamina;Understanding of Exercise Prescription;Able to understand and use rate of perceived exertion (RPE) scale;Able to understand and use Dyspnea scale;Knowledge and understanding of Target Heart Rate Range (THRR);Able to check pulse independently Increase Physical Activity;Increase Strength and Stamina;Understanding of Exercise Prescription   Comments Reviewed RPE and dyspnea scale, THR and program prescription with pt today.  Pt voiced understanding and was given a copy of goals to take home. [name] is off to a great start in rehab, and was able to attend her first session during this review period. During her session she was able to use the recumbent bike at level 1.2, and T4 nustep at level 1. We will continue to moniotr her progress in the program. [name] is doing well in the program. She recently walked up to 24 laps on the track. She also improved to level 3 on both the T4 nustep and the recumbent bike. We will continue to monitor her progress in the program. Reviewed home exercise with pt today.  Pt plans to walk outside in her neighborhood for exercise.  Reviewed THR, pulse, RPE, sign and symptoms, pulse oximetery and when to call 911 or MD.  Also discussed weather considerations and indoor options.  Pt voiced understanding. [name] is doing well in rehab. She was recently able to increase her level on both the T4 nustep and recumbent bike to level 4. She was also able to walk 11 laps on the track. We will continue to monitor her progress in the program.   Expected Outcomes Short: Use RPE daily to regulate intensity. Long: Follow program prescription in THR. Short: Continue to follow exercise  prescription. Long: Continue to improve strength and stamina. Short: Continue to progressively increase workloads. Long: Continue to improve strength and stamina. Short: Implement home exercise into her weekly exercise prescription. Long: Continue exercise to improve strength and stamina. Short: Continue to increase workloads on the recumbent bike and T4 nustep. Long: Continue exercise to improve strength and stamina.      Discharge Exercise Prescription (Final Exercise Prescription Changes):  Exercise Prescription Changes - 11/22/23 1600       Response to Exercise   Blood Pressure (Admit) 102/60    Blood Pressure (Exercise) 130/80    Blood Pressure (Exit) 118/80    Heart Rate (Admit) 72 bpm    Heart Rate (Exercise) 120 bpm    Heart Rate (Exit) 86 bpm    Rating of Perceived Exertion (Exercise) 15    Symptoms none    Duration Continue with 30 min of aerobic exercise without signs/symptoms of physical distress.    Intensity THRR unchanged      Progression   Progression Continue to progress workloads to maintain intensity without signs/symptoms of physical distress.    Average METs 2.05      Resistance Training   Training Prescription Yes    Weight 2 lb    Reps 10-15      Interval Training   Interval Training No      Recumbant Bike   Level 4    Watts 32    Minutes 15    METs 2.53      NuStep   Level 4    Minutes 15    METs 2      Track   Laps 11    Minutes 15    METs 1.6  Home Exercise Plan   Plans to continue exercise at Home (comment)   [name] plans to walk outside in her neighborhood with her husband, it is about a 2 mile loop.   Frequency Add 3 additional days to program exercise sessions.    Initial Home Exercises Provided 11/12/23      Oxygen   Maintain Oxygen Saturation 88% or higher          Nutrition:  Target Goals: Understanding of nutrition guidelines, daily intake of sodium 1500mg , cholesterol 200mg , calories 30% from fat and 7% or less from  saturated fats, daily to have 5 or more servings of fruits and vegetables.  Education: Nutrition 1 -Group instruction provided by verbal, written material, interactive activities, discussions, models, and posters to present general guidelines for heart healthy nutrition including macronutrients, label reading, and promoting whole foods over processed counterparts. Education serves as pensions consultant of discussion of heart healthy eating for all. Written material provided at class time. Flowsheet Row Cardiac Rehab from 11/14/2023 in [facility] Cardiac and Pulmonary Rehab  Date 11/08/23  Educator jg  Instruction Review Code 1- Verbalizes Understanding     Education: Nutrition 2 -Group instruction provided by verbal, written material, interactive activities, discussions, models, and posters to present general guidelines for heart healthy nutrition including sodium, cholesterol, and saturated fat. Providing guidance of habit forming to improve blood pressure, cholesterol, and body weight. Written material provided at class time. Flowsheet Row Cardiac Rehab from 11/14/2023 in [facility] Cardiac and Pulmonary Rehab  Date 11/14/23  Educator jg  Instruction Review Code 1- Verbalizes Understanding      Biometrics:  Pre Biometrics - 10/15/23 1204       Pre Biometrics   Height 5' 6.6 (1.692 m)    Weight 192 lb 8 oz (87.3 kg)    Waist Circumference 40.5 inches    Hip Circumference 47 inches    Waist to Hip Ratio 0.86 %    BMI (Calculated) 30.5    Single Leg Stand 3.2 seconds           Nutrition Therapy Plan and Nutrition Goals:  Nutrition Therapy & Goals - 10/15/23 1206       Personal Nutrition Goals   Nutrition Goal Waitning a couple weeks to schedule with RD.      Intervention Plan   Intervention Prescribe, educate and counsel regarding individualized specific dietary modifications aiming towards targeted core components such as weight, hypertension, lipid management, diabetes, heart failure and  other comorbidities.    Expected Outcomes Short Term Goal: Understand basic principles of dietary content, such as calories, fat, sodium, cholesterol and nutrients.          Nutrition Assessments:  MEDIFICTS Score Key: >=70 Need to make dietary changes  40-70 Heart Healthy Diet <= 40 Therapeutic Level Cholesterol Diet  Flowsheet Row Cardiac Rehab from 10/15/2023 in [facility] Cardiac and Pulmonary Rehab  Picture Your Plate Total Score on Admission 70   Picture Your Plate Scores: <59 Unhealthy dietary pattern with much room for improvement. 41-50 Dietary pattern unlikely to meet recommendations for good health and room for improvement. 51-60 More healthful dietary pattern, with some room for improvement.  >60 Healthy dietary pattern, although there may be some specific behaviors that could be improved.    Nutrition Goals Re-Evaluation:  Nutrition Goals Re-Evaluation     Row Name 11/19/23 0950             Goals   Comment [name] stated that she is interested in meeting with RD  but that she is still not ready. She stated she would like to meet with him around the first of December. RD schedule does not go out that far so patient was reminded to let staff know when she is ready to schedule that appointment.       Expected Outcome Short: meet with RD to set nutrition goals. Long: work on nurse, mental health by RD.          Nutrition Goals Discharge (Final Nutrition Goals Re-Evaluation):  Nutrition Goals Re-Evaluation - 11/19/23 0950       Goals   Comment [name] stated that she is interested in meeting with RD but that she is still not ready. She stated she would like to meet with him around the first of December. RD schedule does not go out that far so patient was reminded to let staff know when she is ready to schedule that appointment.    Expected Outcome Short: meet with RD to set nutrition goals. Long: work on nurse, mental health by RD.          Psychosocial: Target Goals: Acknowledge presence  or absence of significant depression and/or stress, maximize coping skills, provide positive support system. Participant is able to verbalize types and ability to use techniques and skills needed for reducing stress and depression.   Education: Stress, Anxiety, and Depression - Group verbal and visual presentation to define topics covered.  Reviews how body is impacted by stress, anxiety, and depression.  Also discusses healthy ways to reduce stress and to treat/manage anxiety and depression. Written material provided at class time.   Education: Sleep Hygiene -Provides group verbal and written instruction about how sleep can affect your health.  Define sleep hygiene, discuss sleep cycles and impact of sleep habits. Review good sleep hygiene tips.   Initial Review & Psychosocial Screening:  Initial Psych Review & Screening - 09/12/23 1453       Initial Review   Current issues with Current Stress Concerns    Source of Stress Concerns Chronic Illness    Comments [name] gets down since her health event and her husband is picking up the slack.      Family Dynamics   Good Support System? Yes    Comments She can look to her husband, daughters and neighbors for support.      Barriers   Psychosocial barriers to participate in program The patient should benefit from training in stress management and relaxation.      Screening Interventions   Interventions Encouraged to exercise;Provide feedback about the scores to participant;To provide support and resources with identified psychosocial needs    Expected Outcomes Short Term goal: Utilizing psychosocial counselor, staff and physician to assist with identification of specific Stressors or current issues interfering with healing process. Setting desired goal for each stressor or current issue identified.;Long Term Goal: Stressors or current issues are controlled or eliminated.;Short Term goal: Identification and review with participant of any Quality  of Life or Depression concerns found by scoring the questionnaire.;Long Term goal: The participant improves quality of Life and PHQ9 Scores as seen by post scores and/or verbalization of changes          Quality of Life Scores:   Quality of Life - 10/15/23 1205       Quality of Life   Select Quality of Life      Quality of Life Scores   Health/Function Pre 12.86 %    Socioeconomic Pre 30 %    Psych/Spiritual Pre 18.86 %  Family Pre 26.4 %    GLOBAL Pre 19.5 %         Scores of 19 and below usually indicate a poorer quality of life in these areas.  A difference of  2-3 points is a clinically meaningful difference.  A difference of 2-3 points in the total score of the Quality of Life Index has been associated with significant improvement in overall quality of life, self-image, physical symptoms, and general health in studies assessing change in quality of life.  PHQ-9: Review Flowsheet  More data exists      11/14/2023 10/15/2023 09/18/2023 07/25/2023 06/14/2023  Depression screen PHQ 2/9  Decreased Interest 0 0 1 0 0  Down, Depressed, Hopeless 0 1 1 0 1  PHQ - 2 Score 0 1 2 0 1  Altered sleeping 0 0 0 - -  Tired, decreased energy 3 3 1  - -  Change in appetite 1 0 0 - -  Feeling bad or failure about yourself  1 1 1  - -  Trouble concentrating 0 0 0 - -  Moving slowly or fidgety/restless 0 0 0 - -  Suicidal thoughts 0 0 0 - -  PHQ-9 Score 5  5  4   - -  Difficult doing work/chores Somewhat difficult Somewhat difficult Somewhat difficult - -    Details       Data saved with a previous flowsheet row definition        Interpretation of Total Score  Total Score Depression Severity:  1-4 = Minimal depression, 5-9 = Mild depression, 10-14 = Moderate depression, 15-19 = Moderately severe depression, 20-27 = Severe depression   Psychosocial Evaluation and Intervention:  Psychosocial Evaluation - 09/12/23 1455       Psychosocial Evaluation & Interventions   Interventions  Encouraged to exercise with the program and follow exercise prescription;Relaxation education;Stress management education    Comments She can look to her husband, daughters and neighbors for support.[name] gets down since her health event and her husband is picking up the slack.    Expected Outcomes Short: Start [facility] to help with mood. Long: Maintain a healthy mental state    Continue Psychosocial Services  Follow up required by staff          Psychosocial Re-Evaluation:  Psychosocial Re-Evaluation     Row Name 11/14/23 [phone]             Psychosocial Re-Evaluation   Current issues with Current Psychotropic Meds;Current Anxiety/Panic       Comments Reviewed patient health questionnaire (PHQ-9) with patient for follow up. Previously, patients score indicated signs/symptoms of depression.  Reviewed to see if patient is improving symptom wise while in program.  Score stayed the same and patient states that it is because she has a lack of energy daily.       Expected Outcomes Short: Continue to work toward an improvement in PHQ9 scores by attending [facility] regularly. Long: Continue to improve stress and depression coping skills by talking with staff and attendin [facility] regularly and work toward a positive mental state.       Interventions Encouraged to attend Cardiac Rehabilitation for the exercise       Continue Psychosocial Services  Follow up required by staff          Psychosocial Discharge (Final Psychosocial Re-Evaluation):  Psychosocial Re-Evaluation - 11/14/23 0819       Psychosocial Re-Evaluation   Current issues with Current Psychotropic Meds;Current Anxiety/Panic    Comments Reviewed patient health  questionnaire (PHQ-9) with patient for follow up. Previously, patients score indicated signs/symptoms of depression.  Reviewed to see if patient is improving symptom wise while in program.  Score stayed the same and patient states that it is because she has a lack of  energy daily.    Expected Outcomes Short: Continue to work toward an improvement in PHQ9 scores by attending [facility] regularly. Long: Continue to improve stress and depression coping skills by talking with staff and attendin [facility] regularly and work toward a positive mental state.    Interventions Encouraged to attend Cardiac Rehabilitation for the exercise    Continue Psychosocial Services  Follow up required by staff          Vocational Rehabilitation: Provide vocational rehab assistance to qualifying candidates.   Vocational Rehab Evaluation & Intervention:   Education: Education Goals: Education classes will be provided on a variety of topics geared toward better understanding of heart health and risk factor modification. Participant will state understanding/return demonstration of topics presented as noted by education test scores.  Learning Barriers/Preferences:  Learning Barriers/Preferences - 09/12/23 1451       Learning Barriers/Preferences   Learning Barriers None    Learning Preferences None          General Cardiac Education Topics:  AED/CPR: - Group verbal and written instruction with the use of models to demonstrate the basic use of the AED with the basic ABC's of resuscitation.   Test and Procedures: - Group verbal and visual presentation and models provide information about basic cardiac anatomy and function. Reviews the testing methods done to diagnose heart disease and the outcomes of the test results. Describes the treatment choices: Medical Management, Angioplasty, or Coronary Bypass Surgery for treating various heart conditions including Myocardial Infarction, Angina, Valve Disease, and Cardiac Arrhythmias. Written material provided at class time. Flowsheet Row Cardiac Rehab from 11/14/2023 in [facility] Cardiac and Pulmonary Rehab  Education need identified 10/15/23    Medication Safety: - Group verbal and visual instruction to review commonly  prescribed medications for heart and lung disease. Reviews the medication, class of the drug, and side effects. Includes the steps to properly store meds and maintain the prescription regimen. Written material provided at class time.   Intimacy: - Group verbal instruction through game format to discuss how heart and lung disease can affect sexual intimacy. Written material provided at class time.   Know Your Numbers and Heart Failure: - Group verbal and visual instruction to discuss disease risk factors for cardiac and pulmonary disease and treatment options.  Reviews associated critical values for Overweight/Obesity, Hypertension, Cholesterol, and Diabetes.  Discusses basics of heart failure: signs/symptoms and treatments.  Introduces Heart Failure Zone chart for action plan for heart failure. Written material provided at class time. Flowsheet Row Cardiac Rehab from 11/14/2023 in [facility] Cardiac and Pulmonary Rehab  Education need identified 10/15/23    Infection Prevention: - Provides verbal and written material to individual with discussion of infection control including proper hand washing and proper equipment cleaning during exercise session. Flowsheet Row Cardiac Rehab from 11/14/2023 in [facility] Cardiac and Pulmonary Rehab  Date 10/15/23  Educator MB  Instruction Review Code 1- Verbalizes Understanding    Falls Prevention: - Provides verbal and written material to individual with discussion of falls prevention and safety. Flowsheet Row Cardiac Rehab from 11/14/2023 in [facility] Cardiac and Pulmonary Rehab  Date 10/15/23  Educator MB  Instruction Review Code 1- Verbalizes Understanding    Other: -Provides group and verbal instruction on  various topics (see comments)   Knowledge Questionnaire Score:  Knowledge Questionnaire Score - 10/15/23 1207       Knowledge Questionnaire Score   Pre Score 22/26          Core Components/Risk Factors/Patient Goals at Admission:  Personal Goals  and Risk Factors at Admission - 10/15/23 1207       Core Components/Risk Factors/Patient Goals on Admission    Weight Management Yes;Weight Loss    Intervention Weight Management: Develop a combined nutrition and exercise program designed to reach desired caloric intake, while maintaining appropriate intake of nutrient and fiber, sodium and fats, and appropriate energy expenditure required for the weight goal.;Weight Management: Provide education and appropriate resources to help participant work on and attain dietary goals.;Weight Management/Obesity: Establish reasonable short term and long term weight goals.    Admit Weight 192 lb 8 oz (87.3 kg)    Goal Weight: Short Term 180 lb (81.6 kg)    Goal Weight: Long Term 160 lb (72.6 kg)    Expected Outcomes Short Term: Continue to assess and modify interventions until short term weight is achieved;Long Term: Adherence to nutrition and physical activity/exercise program aimed toward attainment of established weight goal;Weight Loss: Understanding of general recommendations for a balanced deficit meal plan, which promotes 1-2 lb weight loss per week and includes a negative energy balance of [phone] kcal/d;Understanding recommendations for meals to include 15-35% energy as protein, 25-35% energy from fat, 35-60% energy from carbohydrates, less than 200mg  of dietary cholesterol, 20-35 gm of total fiber daily;Understanding of distribution of calorie intake throughout the day with the consumption of 4-5 meals/snacks    Diabetes Yes   Diet controlled   Intervention Provide education about signs/symptoms and action to take for hypo/hyperglycemia.;Provide education about proper nutrition, including hydration, and aerobic/resistive exercise prescription along with prescribed medications to achieve blood glucose in normal ranges: Fasting glucose 65-99 mg/dL    Expected Outcomes Short Term: Participant verbalizes understanding of the signs/symptoms and immediate care  of hyper/hypoglycemia, proper foot care and importance of medication, aerobic/resistive exercise and nutrition plan for blood glucose control.;Long Term: Attainment of HbA1C < 7%.    Hypertension Yes    Intervention Provide education on lifestyle modifcations including regular physical activity/exercise, weight management, moderate sodium restriction and increased consumption of fresh fruit, vegetables, and low fat dairy, alcohol moderation, and smoking cessation.;Monitor prescription use compliance.    Expected Outcomes Short Term: Continued assessment and intervention until BP is < 140/60mm HG in hypertensive participants. < 130/26mm HG in hypertensive participants with diabetes, heart failure or chronic kidney disease.;Long Term: Maintenance of blood pressure at goal levels.    Lipids Yes    Intervention Provide education and support for participant on nutrition & aerobic/resistive exercise along with prescribed medications to achieve LDL 70mg , HDL >40mg .    Expected Outcomes Short Term: Participant states understanding of desired cholesterol values and is compliant with medications prescribed. Participant is following exercise prescription and nutrition guidelines.;Long Term: Cholesterol controlled with medications as prescribed, with individualized exercise RX and with personalized nutrition plan. Value goals: LDL < 70mg , HDL > 40 mg.          Education:Diabetes - Individual verbal and written instruction to review signs/symptoms of diabetes, desired ranges of glucose level fasting, after meals and with exercise. Acknowledge that pre and post exercise glucose checks will be done for 3 sessions at entry of program. Flowsheet Row Cardiac Rehab from 11/14/2023 in [facility] Cardiac and Pulmonary Rehab  Date 10/15/23  Educator  MB  Instruction Review Code 1- Verbalizes Understanding  [Diet controlled]    Core Components/Risk Factors/Patient Goals Review:   Goals and Risk Factor Review     Row Name  11/14/23 0822             Core Components/Risk Factors/Patient Goals Review   Personal Goals Review Other       Review [name] would like to speak with her doctor about her shoulder pain and sometimes shortness of breath. She is taking Plavix  and informed patient to speak with her doctor of possible side effects of the medication.       Expected Outcomes Short: speak with doctor about her medication. Long: maintain medication independently.          Core Components/Risk Factors/Patient Goals at Discharge (Final Review):   Goals and Risk Factor Review - 11/14/23 0822       Core Components/Risk Factors/Patient Goals Review   Personal Goals Review Other    Review [name] would like to speak with her doctor about her shoulder pain and sometimes shortness of breath. She is taking Plavix  and informed patient to speak with her doctor of possible side effects of the medication.    Expected Outcomes Short: speak with doctor about her medication. Long: maintain medication independently.          ITP Comments:  ITP Comments     Row Name 09/12/23 1500 10/15/23 1157 10/16/23 0735 11/07/23 1005 12/05/23 0947   ITP Comments Virtual Visit completed. Patient informed on EP and RD appointment and 6 Minute walk test. Patient also informed of patient health questionnaires on My Chart. Patient Verbalizes understanding. Visit diagnosis can be found in [facility] 12/11/2023. Completed and gym orientation for cardiac rehab. Initial ITP created and sent for review to Dr. Oneil Pinal, Medical Director. First full day of exercise!  Patient was oriented to gym and equipment including functions, settings, policies, and procedures.  Patient's individual exercise prescription and treatment plan were reviewed.  All starting workloads were established based on the results of the 6 minute walk test done at initial orientation visit.  The plan for exercise progression was also introduced and progression will be customized  based on patient's performance and goals. 30 Day review completed. Medical Director ITP review done, changes made as directed, and signed approval by Medical Director. 30 Day review completed. Medical Director ITP review done, changes made as directed, and signed approval by Medical Director.      Comments: 30 day review ITP

## 2023-12-06 ENCOUNTER — Encounter: Admitting: Emergency Medicine

## 2023-12-06 DIAGNOSIS — Z955 Presence of coronary angioplasty implant and graft: Secondary | ICD-10-CM | POA: Diagnosis not present

## 2023-12-06 DIAGNOSIS — I214 Non-ST elevation (NSTEMI) myocardial infarction: Secondary | ICD-10-CM

## 2023-12-06 NOTE — Progress Notes (Signed)
 Daily Session Note  Patient Details  Name: [name] MRN: [id number] Date of Birth: [birth year] Referring Provider:   Flowsheet Row Cardiac Rehab from 10/15/2023 in [facility] Cardiac and Pulmonary Rehab  Referring Provider Darron Grass, MD    Encounter Date: 12/06/2023  Check In:  Session Check In - 12/06/23 0924       Check-In   Supervising physician immediately available to respond to emergencies See telemetry face sheet for immediately available ER MD    Location [facility]-Cardiac & Pulmonary Rehab    Staff Present Leita Franks RN,BSN;Maxon Burnell BS, Exercise Physiologist;Margaret Best, MS, Exercise Physiologist;Jason Elnor RDN,LDN    Virtual Visit No    Medication changes reported     No    Fall or balance concerns reported    No    Warm-up and Cool-down Performed on first and last piece of equipment    Resistance Training Performed Yes    VAD Patient? No    PAD/SET Patient? No      Pain Assessment   Currently in Pain? No/denies             Social History   Tobacco Use  Smoking Status Former   Current packs/day: 0.00   Types: Cigarettes   Quit date: 01/16/1978   Years since quitting: 45.9  Smokeless Tobacco Never  Tobacco Comments   REMOTELY QUIT IN 1987 AFTER A FEW YEARS OF USE    Goals Met:  Independence with exercise equipment Exercise tolerated well No report of concerns or symptoms today Strength training completed today  Goals Unmet:  Not Applicable  Comments: Pt able to follow exercise prescription today without complaint.  Will continue to monitor for progression.    Dr. Oneil Pinal is Medical Director for [facility] Cardiac Rehabilitation.  Dr. Fuad Aleskerov is Medical Director for [facility] Pulmonary Rehabilitation.

## 2023-12-10 ENCOUNTER — Encounter

## 2023-12-10 DIAGNOSIS — Z955 Presence of coronary angioplasty implant and graft: Secondary | ICD-10-CM

## 2023-12-10 DIAGNOSIS — I214 Non-ST elevation (NSTEMI) myocardial infarction: Secondary | ICD-10-CM

## 2023-12-10 NOTE — Progress Notes (Signed)
 Daily Session Note  Patient Details  Name: [name] MRN: [id number] Date of Birth: [birth year] Referring Provider:   Flowsheet Row Cardiac Rehab from 10/15/2023 in [facility] Cardiac and Pulmonary Rehab  Referring Provider Darron Grass, MD    Encounter Date: 12/10/2023  Check In:  Session Check In - 12/10/23 9075       Check-In   Supervising physician immediately available to respond to emergencies See telemetry face sheet for immediately available ER MD    Location [facility]-Cardiac & Pulmonary Rehab    Staff Present Burnard Davenport RN,BSN,MPA;Joseph [facility] Dyane BS, ACSM CEP, Exercise Physiologist;Maxon Conetta BS, Exercise Physiologist    Virtual Visit No    Medication changes reported     No    Fall or balance concerns reported    No    Warm-up and Cool-down Performed on first and last piece of equipment    Resistance Training Performed Yes    VAD Patient? No    PAD/SET Patient? No      Pain Assessment   Currently in Pain? No/denies             Social History   Tobacco Use  Smoking Status Former   Current packs/day: 0.00   Types: Cigarettes   Quit date: 01/16/1978   Years since quitting: 45.9  Smokeless Tobacco Never  Tobacco Comments   REMOTELY QUIT IN 1987 AFTER A FEW YEARS OF USE    Goals Met:  Independence with exercise equipment Exercise tolerated well No report of concerns or symptoms today Strength training completed today  Goals Unmet:  Not Applicable  Comments: Pt able to follow exercise prescription today without complaint.  Will continue to monitor for progression.    Dr. Oneil Pinal is Medical Director for [facility] Cardiac Rehabilitation.  Dr. Fuad Aleskerov is Medical Director for [facility] Pulmonary Rehabilitation.

## 2023-12-12 ENCOUNTER — Encounter

## 2023-12-12 DIAGNOSIS — Z955 Presence of coronary angioplasty implant and graft: Secondary | ICD-10-CM | POA: Diagnosis not present

## 2023-12-12 DIAGNOSIS — I214 Non-ST elevation (NSTEMI) myocardial infarction: Secondary | ICD-10-CM

## 2023-12-12 NOTE — Progress Notes (Signed)
 Daily Session Note  Patient Details  Name: [name] MRN: [id number] Date of Birth: 1945/02/21 Referring Provider:   Flowsheet Row Cardiac Rehab from 10/15/2023 in [facility] Cardiac and Pulmonary Rehab  Referring Provider Darron Grass, MD    Encounter Date: 12/12/2023  Check In:  Session Check In - 12/12/23 0916       Check-In   Supervising physician immediately available to respond to emergencies See telemetry face sheet for immediately available ER MD    Location [facility]-Cardiac & Pulmonary Rehab    Staff Present Burnard Davenport RN,BSN,MPA;Maxon Conetta BS, Exercise Physiologist;Laura Cates RN,BSN;Margaret Best, MS, Exercise Physiologist    Virtual Visit No    Medication changes reported     No    Fall or balance concerns reported    No    Warm-up and Cool-down Performed on first and last piece of equipment    Resistance Training Performed Yes    VAD Patient? No    PAD/SET Patient? No      Pain Assessment   Currently in Pain? No/denies             Social History   Tobacco Use  Smoking Status Former   Current packs/day: 0.00   Types: Cigarettes   Quit date: 01/16/1978   Years since quitting: 45.9  Smokeless Tobacco Never  Tobacco Comments   REMOTELY QUIT IN 1987 AFTER A FEW YEARS OF USE    Goals Met:  Independence with exercise equipment Exercise tolerated well No report of concerns or symptoms today Strength training completed today  Goals Unmet:  Not Applicable  Comments: Pt able to follow exercise prescription today without complaint.  Will continue to monitor for progression.    Dr. Oneil Pinal is Medical Director for [facility] Cardiac Rehabilitation.  Dr. Fuad Aleskerov is Medical Director for [facility] Pulmonary Rehabilitation.

## 2023-12-17 ENCOUNTER — Encounter: Admitting: Emergency Medicine

## 2023-12-17 ENCOUNTER — Encounter: Attending: Cardiovascular Disease

## 2023-12-17 ENCOUNTER — Encounter

## 2023-12-17 DIAGNOSIS — I214 Non-ST elevation (NSTEMI) myocardial infarction: Secondary | ICD-10-CM

## 2023-12-17 DIAGNOSIS — Z955 Presence of coronary angioplasty implant and graft: Secondary | ICD-10-CM | POA: Diagnosis present

## 2023-12-17 NOTE — Progress Notes (Signed)
 Assessment start time: 1:00 PM  Education r/t nutrition plan Tylar reports she has been through a lot in the past year or so. She had heart attack with 3 stents placed. Then had a GI bleed and bulging disks which caused 2 pinched nerves. She went to PT and then cardiac rehab, she reports those two programs have helped a lot with breathing and mobility but she still has pain. She says she lost ~30lbs over the course of these events but says the past 3 months she has gained ~10lbs. Spoke with her about making sure her weight loss is healthy and she is not losing muscle mass or strength. She has attended both RD classes and remembers much of what what taught. She meets with RD today for additional clarity on facts labels and sugar intake. Ways to break habits of eating sweet foods after every meal. Reviewed these topics and brainstormed healthier snacks with less calories and sugar.   Goal 1: Eat 15-30gProtein and 30-60gCarbs at each meal. Goal 2: cut back on snacking and keep sugar under 50g per day Goal 3: Read labels and reduce sodium intake to below 2300mg . Ideally 1500mg  per day.   End time 1:36 PM

## 2023-12-17 NOTE — Progress Notes (Signed)
 Daily Session Note  Patient Details  Name: [name] MRN: [id number] Date of Birth: 06-Feb-1945 Referring Provider:   Flowsheet Row Cardiac Rehab from 10/15/2023 in [facility] Cardiac and Pulmonary Rehab  Referring Provider Darron Grass, MD    Encounter Date: 12/17/2023  Check In:  Session Check In - 12/17/23 1355       Check-In   Supervising physician immediately available to respond to emergencies See telemetry face sheet for immediately available ER MD    Location [facility]-Cardiac & Pulmonary Rehab    Staff Present Leita Franks RN,BSN;Joseph [facility] BS, Exercise Physiologist;Margaret Best, MS, Exercise Physiologist;Kelly Dyane HECKLE, ACSM CEP, Exercise Physiologist;Noah Tickle, BS, Exercise Physiologist    Virtual Visit No    Medication changes reported     No    Fall or balance concerns reported    No    Warm-up and Cool-down Performed on first and last piece of equipment    Resistance Training Performed Yes    VAD Patient? No    PAD/SET Patient? No      Pain Assessment   Currently in Pain? No/denies             Social History   Tobacco Use  Smoking Status Former   Current packs/day: 0.00   Types: Cigarettes   Quit date: 01/16/1978   Years since quitting: 45.9  Smokeless Tobacco Never  Tobacco Comments   REMOTELY QUIT IN 1987 AFTER A FEW YEARS OF USE    Goals Met:  Independence with exercise equipment Exercise tolerated well No report of concerns or symptoms today Strength training completed today  Goals Unmet:  Not Applicable  Comments: Pt able to follow exercise prescription today without complaint.  Will continue to monitor for progression.    Dr. Oneil Pinal is Medical Director for [facility] Cardiac Rehabilitation.  Dr. Fuad Aleskerov is Medical Director for [facility] Pulmonary Rehabilitation.

## 2023-12-18 ENCOUNTER — Other Ambulatory Visit

## 2023-12-18 DIAGNOSIS — E1169 Type 2 diabetes mellitus with other specified complication: Secondary | ICD-10-CM | POA: Diagnosis not present

## 2023-12-18 DIAGNOSIS — E119 Type 2 diabetes mellitus without complications: Secondary | ICD-10-CM | POA: Diagnosis not present

## 2023-12-18 DIAGNOSIS — E538 Deficiency of other specified B group vitamins: Secondary | ICD-10-CM

## 2023-12-18 DIAGNOSIS — E785 Hyperlipidemia, unspecified: Secondary | ICD-10-CM

## 2023-12-18 DIAGNOSIS — E669 Obesity, unspecified: Secondary | ICD-10-CM | POA: Diagnosis not present

## 2023-12-18 DIAGNOSIS — E559 Vitamin D deficiency, unspecified: Secondary | ICD-10-CM

## 2023-12-18 DIAGNOSIS — I10 Essential (primary) hypertension: Secondary | ICD-10-CM

## 2023-12-18 LAB — B12 AND FOLATE PANEL
Folate: 16.2 ng/mL (ref >5.9)
Vitamin B-12: 132 pg/mL — ABNORMAL LOW (ref 211–911)

## 2023-12-18 LAB — COMPREHENSIVE METABOLIC PANEL WITH GFR
ALT: 9 U/L (ref 0–35)
AST: 13 U/L (ref 0–37)
Albumin: 4.1 g/dL (ref 3.5–5.2)
Alkaline Phosphatase: 61 U/L (ref 39–117)
BUN: 12 mg/dL (ref 6–23)
CO2: 29 meq/L (ref 19–32)
Calcium: 8.9 mg/dL (ref 8.4–10.5)
Chloride: 104 meq/L (ref 96–112)
Creatinine, Ser: 0.88 mg/dL (ref 0.40–1.20)
GFR: 62.58 mL/min (ref >60.00)
Glucose, Bld: 112 mg/dL — ABNORMAL HIGH (ref 70–99)
Potassium: 4.4 meq/L (ref 3.5–5.1)
Sodium: 138 meq/L (ref 135–145)
Total Bilirubin: 1.3 mg/dL — ABNORMAL HIGH (ref 0.2–1.2)
Total Protein: 6.9 g/dL (ref 6.0–8.3)

## 2023-12-18 LAB — LIPID PANEL
Cholesterol: 136 mg/dL (ref 0–200)
HDL: 50.4 mg/dL (ref >39.00)
LDL Cholesterol: 61 mg/dL (ref 0–99)
NonHDL: 85.9
Total CHOL/HDL Ratio: 3
Triglycerides: 127 mg/dL (ref 0.0–149.0)
VLDL: 25.4 mg/dL (ref 0.0–40.0)

## 2023-12-18 LAB — LDL CHOLESTEROL, DIRECT: Direct LDL: 72 mg/dL

## 2023-12-18 LAB — VITAMIN D 25 HYDROXY (VIT D DEFICIENCY, FRACTURES): VITD: 30.56 ng/mL (ref 30.00–100.00)

## 2023-12-18 LAB — HEMOGLOBIN A1C: Hgb A1c MFr Bld: 6 % (ref 4.6–6.5)

## 2023-12-20 ENCOUNTER — Encounter

## 2023-12-20 DIAGNOSIS — I214 Non-ST elevation (NSTEMI) myocardial infarction: Secondary | ICD-10-CM | POA: Diagnosis not present

## 2023-12-20 DIAGNOSIS — Z955 Presence of coronary angioplasty implant and graft: Secondary | ICD-10-CM

## 2023-12-20 NOTE — Progress Notes (Signed)
 Daily Session Note  Patient Details  Name: [name] MRN: [id number] Date of Birth: 01-28-1945 Referring Provider:   Flowsheet Row Cardiac Rehab from 10/15/2023 in [facility] Cardiac and Pulmonary Rehab  Referring Provider Darron Grass, MD    Encounter Date: 12/20/2023  Check In:  Session Check In - 12/20/23 0924       Check-In   Supervising physician immediately available to respond to emergencies See telemetry face sheet for immediately available ER MD    Location [facility]-Cardiac & Pulmonary Rehab    Staff Present Leita Franks RN,BSN;Joseph [facility] BS, Exercise Physiologist;Laureen Delores, BS, RRT, CPFT    Virtual Visit No    Medication changes reported     No    Fall or balance concerns reported    No    Warm-up and Cool-down Performed on first and last piece of equipment    Resistance Training Performed Yes    VAD Patient? No    PAD/SET Patient? No      Pain Assessment   Currently in Pain? No/denies             Social History   Tobacco Use  Smoking Status Former   Current packs/day: 0.00   Types: Cigarettes   Quit date: 01/16/1978   Years since quitting: 45.9  Smokeless Tobacco Never  Tobacco Comments   REMOTELY QUIT IN 1987 AFTER A FEW YEARS OF USE    Goals Met:  Independence with exercise equipment Exercise tolerated well No report of concerns or symptoms today Strength training completed today  Goals Unmet:  Not Applicable  Comments: Pt able to follow exercise prescription today without complaint.  Will continue to monitor for progression.    Dr. Oneil Pinal is Medical Director for [facility] Cardiac Rehabilitation.  Dr. Fuad Aleskerov is Medical Director for [facility] Pulmonary Rehabilitation.

## 2023-12-21 ENCOUNTER — Ambulatory Visit: Admitting: Internal Medicine

## 2023-12-24 ENCOUNTER — Encounter

## 2023-12-24 DIAGNOSIS — Z955 Presence of coronary angioplasty implant and graft: Secondary | ICD-10-CM

## 2023-12-24 DIAGNOSIS — I214 Non-ST elevation (NSTEMI) myocardial infarction: Secondary | ICD-10-CM

## 2023-12-24 NOTE — Progress Notes (Signed)
 Daily Session Note  Patient Details  Name: ARVADA SEABORN MRN: [id number] Date of Birth: 03/08/1945 Referring Provider:   Flowsheet Row Cardiac Rehab from 10/15/2023 in [facility] Cardiac and Pulmonary Rehab  Referring Provider Darron Grass, MD    Encounter Date: 12/24/2023  Check In:  Session Check In - 12/24/23 0918       Check-In   Supervising physician immediately available to respond to emergencies See telemetry face sheet for immediately available ER MD    Location [facility]-Cardiac & Pulmonary Rehab    Staff Present Burnard Davenport RN,BSN,MPA;Joseph Hood RCP,RRT,BSRT;Maxon Burnell BS, Exercise Physiologist;Kilea Mccarey Dyane HECKLE, ACSM CEP, Exercise Physiologist    Virtual Visit No    Medication changes reported     No    Fall or balance concerns reported    No    Warm-up and Cool-down Performed on first and last piece of equipment    Resistance Training Performed Yes    VAD Patient? No    PAD/SET Patient? No      Pain Assessment   Currently in Pain? No/denies             Social History   Tobacco Use  Smoking Status Former   Current packs/day: 0.00   Types: Cigarettes   Quit date: 01/16/1978   Years since quitting: 45.9  Smokeless Tobacco Never  Tobacco Comments   REMOTELY QUIT IN 1987 AFTER A FEW YEARS OF USE    Goals Met:  Proper associated with RPD/PD & O2 Sat Independence with exercise equipment Exercise tolerated well No report of concerns or symptoms today Strength training completed today  Goals Unmet:  Not Applicable  Comments: Pt able to follow exercise prescription today without complaint.  Will continue to monitor for progression.    Dr. Oneil Pinal is Medical Director for [facility] Cardiac Rehabilitation.  Dr. Fuad Aleskerov is Medical Director for [facility] Pulmonary Rehabilitation.

## 2023-12-25 ENCOUNTER — Ambulatory Visit: Admitting: Internal Medicine

## 2023-12-25 ENCOUNTER — Encounter: Payer: Self-pay | Admitting: Internal Medicine

## 2023-12-25 VITALS — BP 138/84 | HR 75 | Ht 66.0 in | Wt 197.8 lb

## 2023-12-25 DIAGNOSIS — E119 Type 2 diabetes mellitus without complications: Secondary | ICD-10-CM

## 2023-12-25 DIAGNOSIS — E1169 Type 2 diabetes mellitus with other specified complication: Secondary | ICD-10-CM

## 2023-12-25 DIAGNOSIS — E538 Deficiency of other specified B group vitamins: Secondary | ICD-10-CM

## 2023-12-25 MED ORDER — CYANOCOBALAMIN 1000 MCG/ML IJ SOLN
1000.0000 ug | Freq: Once | INTRAMUSCULAR | Status: AC
  Administered 2023-12-25: 1000 ug via INTRAMUSCULAR

## 2023-12-25 NOTE — Patient Instructions (Signed)
 The additional blood tests today are to determine  WHY your b12 level keeps dropping  Regardless of the results,  you  WILL need 3 more weekly injections to build you back up  Your diabetes remains under excellent control  And your cholesterol and other labs are also normal. Please continue your current medications. return in 6 months for follow up on diabetes and make sure you are seeing your eye doctor at least once a year for a dilated retina exam to monitor for diabetic retinopathy,. changes that can lead to blindness .   May the Lord give you peace and joy during this holiday season, and may the promise of His return bring you comfort and hope for the future.  Regards,   Verneita Kettering, MD

## 2023-12-25 NOTE — Progress Notes (Unsigned)
 Subjective:  Patient ID: [name], female    DOB: 1945-02-16  Age: 78 y.o. MRN: [id number]  CC: There were no encounter diagnoses.    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  Does not want it in the oxygen  [place].   [employer]  [phone]  [id number]  or [phone] ([place])   3) doing cardiopulmonary rehab; only going 2 times  per week,   walks at home unless too cold.      Outpatient Medications Prior to Visit  Medication Sig Dispense Refill   albuterol  (VENTOLIN  HFA) 108 (90 Base) MCG/ACT inhaler Inhale 1-2 puffs into the lungs every 4-6 hours as needed for cough/wheeze. 18 g 0   ALPRAZolam  (XANAX ) 0.25 MG tablet Take 1 tablet (0.25 mg total) by mouth 2 (two) times daily as needed for anxiety. 30 tablet 0   bimatoprost  (LUMIGAN ) 0.01 % SOLN Place 1 drop into both eyes at bedtime. 2.5 mL 5   budesonide  (PULMICORT ) 180 MCG/ACT inhaler Inhale 2 puffs into the lungs 2 (two) times daily.     carvedilol  (COREG ) 3.125 MG tablet Take 1 tablet (3.125 mg total) by mouth 2 (two) times daily with a meal. 180 tablet 3   cetirizine (ZYRTEC) 10 MG tablet Take 10 mg by mouth daily.     clopidogrel  (PLAVIX ) 75 MG tablet Take 1 tablet (75 mg total) by mouth daily. 90 tablet 3   EPINEPHrine 0.3 mg/0.3 mL IJ SOAJ injection as needed.     ezetimibe  (ZETIA ) 10 MG tablet Take 1 tablet (10 mg total) by mouth daily. (Patient taking differently: Take 10 mg by mouth every other day.) 90 tablet 3   MILK THISTLE PO Take 1 capsule by mouth daily.      nitroGLYCERIN  (NITROSTAT ) 0.4 MG SL tablet Place 1 tablet (0.4 mg total) under the tongue every 5 (five) minutes for up to 3 doses as needed for chest pain. 25 tablet 0   polyethylene glycol (MIRALAX  / GLYCOLAX ) 17 g packet Take 17 g  by mouth daily as needed for mild constipation. 14 each 0   Probiotic Product (PROBIOTIC COMPLEX ACIDOPHILUS PO) Take 1 capsule by mouth daily.     rosuvastatin  (CRESTOR ) 5 MG tablet Take 1 tablet (5 mg total) by mouth daily. (Patient taking differently: Take 5 mg by mouth every other day.) 90 tablet 3   sodium chloride  (OCEAN) 0.65 % SOLN nasal spray Place 1 spray into the nose as needed.     No facility-administered medications prior to visit.    Review of Systems;  Patient denies headache, fevers, malaise, unintentional weight loss, skin rash, eye pain, sinus congestion and sinus pain, sore throat, dysphagia,  hemoptysis , cough, dyspnea, wheezing, chest pain, palpitations, orthopnea, edema, abdominal pain, nausea, melena, diarrhea, constipation, flank pain, dysuria, hematuria, urinary  Frequency, nocturia, numbness, tingling, seizures,  Focal weakness, Loss of consciousness,  Tremor, insomnia, depression, anxiety, and suicidal ideation.      Objective:  BP 138/84   Pulse 75   Ht 5' 6 (1.676 m)   Wt 197 lb 12.8 oz (89.7 kg)   SpO2 97%   BMI 31.93 kg/m   BP Readings  from Last 3 Encounters:  12/25/23 138/84  11/27/23 120/78  09/18/23 126/84    Wt Readings from Last 3 Encounters:  12/25/23 197 lb 12.8 oz (89.7 kg)  11/27/23 198 lb 2 oz (89.9 kg)  10/15/23 192 lb 8 oz (87.3 kg)    Physical Exam  Lab Results  Component Value Date   HGBA1C 6.0 12/18/2023   HGBA1C 5.8 (A) 09/18/2023   HGBA1C 6.2 06/14/2023    Lab Results  Component Value Date   CREATININE 0.88 12/18/2023   CREATININE 0.97 09/11/2023   CREATININE 0.92 07/23/2023    Lab Results  Component Value Date   WBC 7.2 03/05/2023   HGB 13.3 03/05/2023   HCT 39.9 03/05/2023   PLT 315.0 03/05/2023   GLUCOSE 112 (H) 12/18/2023   CHOL 136 12/18/2023   TRIG 127.0 12/18/2023   HDL 50.40 12/18/2023   LDLDIRECT 72.0 12/18/2023   LDLCALC 61 12/18/2023   ALT 9 12/18/2023   AST 13 12/18/2023   NA 138 12/18/2023    K 4.4 12/18/2023   CL 104 12/18/2023   CREATININE 0.88 12/18/2023   BUN 12 12/18/2023   CO2 29 12/18/2023   TSH 3.43 11/13/2022   INR 1.1 11/17/2022   HGBA1C 6.0 12/18/2023   MICROALBUR <0.7 06/14/2023    MM 3D SCREENING MAMMOGRAM BILATERAL BREAST Result Date: 10/11/2023 CLINICAL DATA:  Screening. EXAM: DIGITAL SCREENING BILATERAL MAMMOGRAM WITH TOMOSYNTHESIS AND CAD TECHNIQUE: Bilateral screening digital craniocaudal and mediolateral oblique mammograms were obtained. Bilateral screening digital breast tomosynthesis was performed. The images were evaluated with computer-aided detection. COMPARISON:  Previous exam(s). ACR Breast Density Category b: There are scattered areas of fibroglandular density. FINDINGS: There are no findings suspicious for malignancy. IMPRESSION: No mammographic evidence of malignancy. A result letter of this screening mammogram will be mailed directly to the patient. RECOMMENDATION: Screening mammogram in one year. (Code:SM-B-01Y) BI-RADS CATEGORY  1: Negative. Electronically Signed   By: Rosina Gelineau M.D.   On: 10/11/2023 16:10    Assessment & Plan:  .There are no diagnoses linked to this encounter.   I spent 34 minutes on the day of this face to face encounter reviewing patient's  most recent visit with cardiology,  nephrology,  and neurology,  prior relevant surgical and non surgical procedures, recent  labs and imaging studies, counseling on weight management,  reviewing the assessment and plan with patient, and post visit ordering and reviewing of  diagnostics and therapeutics with patient  .   Follow-up: No follow-ups on file.   Verneita LITTIE Kettering, MD

## 2023-12-26 ENCOUNTER — Encounter: Payer: Self-pay | Admitting: Internal Medicine

## 2023-12-26 DIAGNOSIS — E669 Obesity, unspecified: Secondary | ICD-10-CM | POA: Insufficient documentation

## 2023-12-26 DIAGNOSIS — I5042 Chronic combined systolic (congestive) and diastolic (congestive) heart failure: Secondary | ICD-10-CM | POA: Insufficient documentation

## 2023-12-26 DIAGNOSIS — H93A2 Pulsatile tinnitus, left ear: Secondary | ICD-10-CM | POA: Insufficient documentation

## 2023-12-26 NOTE — Assessment & Plan Note (Signed)
 Diagnosed by sleep study. She is wearing her CPAP every night a minimum of 6 hours per night and notes improved daytime wakefulness and decreased fatigue  However she has not been using supplemental oxygen despite evidence of hypoxemia. And despite repeated Counselling given.  Letter to [employer] written to discontinue oxygen

## 2023-12-26 NOTE — Assessment & Plan Note (Signed)
 Treated only with CPAP per patient preference:  She declines  to have repeat sleep study with CPAP and declines the use of supplemental oxygen despite advice regarding the potential consequences

## 2023-12-26 NOTE — Assessment & Plan Note (Addendum)
 Secondary to NSTEMI Nov 2024.  Repeat Jan 2025 Echo showed improved pump function LVEF 45-50% (mildly reduced), impaired relaxation, mild valve disease he has had follow up with Dr Darron since her last visit .  Continue aggressive BP control.  Will offer additional therapy with  Jardiance pending review of today's labs.

## 2023-12-26 NOTE — Assessment & Plan Note (Signed)
 Recurrent.  She has resumed oral supplements and received parenteral dose #1 today.  IF ab and parietal cell Ab pending   Lab Results  Component Value Date   VITAMINB12 132 (L) 12/18/2023

## 2023-12-26 NOTE — Assessment & Plan Note (Addendum)
 She is tolerating every other day Zetia   and every other day Crestor . .  She has rejected offer of PCSK9 inhiibitor .  LDL is closer to goal.  And A1c is excellent.  No changes today    Lab Results  Component Value Date   HGBA1C 6.0 12/18/2023    .   Lab Results  Component Value Date   CHOL 136 12/18/2023   HDL 50.40 12/18/2023   LDLCALC 61 12/18/2023   LDLDIRECT 72.0 12/18/2023   TRIG 127.0 12/18/2023   CHOLHDL 3 12/18/2023

## 2023-12-26 NOTE — Assessment & Plan Note (Signed)
 Reviewed the potential causes and recommended carotid ultrasound.  She has deferred as she is planning to get one done during the [facility] screening

## 2023-12-26 NOTE — Assessment & Plan Note (Signed)
 Complicated by diabetes, MASH and hypertension . She has lost 18 lbs since Jan 2025 intentionally through dietary restriction .  She has deferred pharmacotherapy with GLP 1 agonist.   .

## 2023-12-26 NOTE — Assessment & Plan Note (Signed)
 hepatic enzymes are normal.  She is encouaraged to continue attempts to lose  weight despite her orthopedic issues.  Lab Results  Component Value Date   ALT 9 12/18/2023   AST 13 12/18/2023   ALKPHOS 61 12/18/2023   BILITOT 1.3 (H) 12/18/2023

## 2023-12-27 ENCOUNTER — Encounter: Admitting: Emergency Medicine

## 2023-12-27 DIAGNOSIS — I214 Non-ST elevation (NSTEMI) myocardial infarction: Secondary | ICD-10-CM | POA: Diagnosis not present

## 2023-12-27 DIAGNOSIS — Z955 Presence of coronary angioplasty implant and graft: Secondary | ICD-10-CM

## 2023-12-27 NOTE — Progress Notes (Signed)
 Daily Session Note  Patient Details  Name: [name] MRN: [id number] Date of Birth: Oct 24, 1945 Referring Provider:   Flowsheet Row Cardiac Rehab from 10/15/2023 in [facility] Cardiac and Pulmonary Rehab  Referring Provider Darron Grass, MD    Encounter Date: 12/27/2023  Check In:  Session Check In - 12/27/23 0935       Check-In   Supervising physician immediately available to respond to emergencies See telemetry face sheet for immediately available ER MD    Location [facility]-Cardiac & Pulmonary Rehab    Staff Present Leita Franks RN,BSN;Joseph [facility] BS, Exercise Physiologist;Kristen Coble RN,BC,MSN    Virtual Visit No    Medication changes reported     No    Fall or balance concerns reported    No    Warm-up and Cool-down Performed on first and last piece of equipment    Resistance Training Performed Yes    VAD Patient? No    PAD/SET Patient? No      Pain Assessment   Currently in Pain? No/denies             Tobacco Use History[1]  Goals Met:  Independence with exercise equipment Exercise tolerated well No report of concerns or symptoms today Strength training completed today  Goals Unmet:  Not Applicable  Comments: Pt able to follow exercise prescription today without complaint.  Will continue to monitor for progression.    Dr. Oneil Pinal is Medical Director for [facility] Cardiac Rehabilitation.  Dr. Fuad Aleskerov is Medical Director for [facility] Pulmonary Rehabilitation.    [1]  Social History Tobacco Use  Smoking Status Former   Current packs/day: 0.00   Types: Cigarettes   Quit date: 01/16/1978   Years since quitting: 45.9  Smokeless Tobacco Never  Tobacco Comments   REMOTELY QUIT IN 1987 AFTER A FEW YEARS OF USE

## 2023-12-31 ENCOUNTER — Encounter

## 2023-12-31 DIAGNOSIS — Z955 Presence of coronary angioplasty implant and graft: Secondary | ICD-10-CM

## 2023-12-31 DIAGNOSIS — I214 Non-ST elevation (NSTEMI) myocardial infarction: Secondary | ICD-10-CM

## 2023-12-31 NOTE — Progress Notes (Signed)
 Daily Session Note  Patient Details  Name: [name] MRN: [id number] Date of Birth: 1945-08-21 Referring Provider:   Flowsheet Row Cardiac Rehab from 10/15/2023 in [facility] Cardiac and Pulmonary Rehab  Referring Provider Darron Grass, MD    Encounter Date: 12/31/2023  Check In:  Session Check In - 12/31/23 0930       Check-In   Supervising physician immediately available to respond to emergencies See telemetry face sheet for immediately available ER MD    Location [facility]-Cardiac & Pulmonary Rehab    Staff Present Burnard Davenport RN,BSN,MPA;Joseph Hood RCP,RRT,BSRT;Maxon Burnell BS, Exercise Physiologist;Baeleigh Devincent Dyane HECKLE, ACSM CEP, Exercise Physiologist    Virtual Visit No    Medication changes reported     No    Fall or balance concerns reported    No    Warm-up and Cool-down Performed on first and last piece of equipment    Resistance Training Performed Yes    VAD Patient? No    PAD/SET Patient? No      Pain Assessment   Currently in Pain? No/denies             Tobacco Use History[1]  Goals Met:  Proper associated with RPD/PD & O2 Sat Independence with exercise equipment Exercise tolerated well No report of concerns or symptoms today Strength training completed today  Goals Unmet:  Not Applicable  Comments: Pt able to follow exercise prescription today without complaint.  Will continue to monitor for progression.    Dr. Oneil Pinal is Medical Director for [facility] Cardiac Rehabilitation.  Dr. Fuad Aleskerov is Medical Director for [facility] Pulmonary Rehabilitation.     [1]  Social History Tobacco Use  Smoking Status Former   Current packs/day: 0.00   Types: Cigarettes   Quit date: 01/16/1978   Years since quitting: 45.9  Smokeless Tobacco Never  Tobacco Comments   REMOTELY QUIT IN 1987 AFTER A FEW YEARS OF USE

## 2024-01-02 DIAGNOSIS — Z955 Presence of coronary angioplasty implant and graft: Secondary | ICD-10-CM

## 2024-01-02 DIAGNOSIS — I214 Non-ST elevation (NSTEMI) myocardial infarction: Secondary | ICD-10-CM

## 2024-01-02 NOTE — Progress Notes (Signed)
 Cardiac Individual Treatment Plan  Patient Details  Name: [name] MRN: [id number] Date of Birth: 14-Aug-1945 Referring Provider:   Flowsheet Row Cardiac Rehab from 10/15/2023 in [facility] Cardiac and Pulmonary Rehab  Referring Provider Darron Grass, MD    Initial Encounter Date:  Flowsheet Row Cardiac Rehab from 10/15/2023 in [facility] Cardiac and Pulmonary Rehab  Date 10/15/23    Visit Diagnosis: NSTEMI (non-ST elevation myocardial infarction) [facility])  Status post coronary artery stent placement  Patient's Home Medications on Admission: Current Medications[1]  Past Medical History: Past Medical History:  Diagnosis Date   Acute GI bleeding 02/08/2023   Angina pectoris 04/14/2015   Asthma    Asthma due to environmental allergies    grass, mold, trees, dust   Cataract    Cellulitis    LEFT LEG   Diabetes mellitus    CONTROLLED ON DIET ALONE   Diverticulitis of colon 02/09/2023   Diverticulitis of intestine with bleeding 02/08/2023   Edema leg    LEFT LEG...CHRONIC   Endometrial hyperplasia    External hemorrhoid    Fatty liver    Glaucoma    Hepatic cyst    STABLE PER 05/20/2008 ULTRASOUND   Hypertension    borderline...controlled since taking herself of HCTZ IN JULY   IBS (irritable bowel syndrome)    Lactic acidosis 02/09/2023   Murmur, cardiac    Neuropathy    Numbness of fingers of both hands 04/17/2015   OSA on CPAP    Osteoarthritis    Psoriasis    Sleep apnea    CPAP    Tobacco Use: Tobacco Use History[2]  Labs: Review Flowsheet  More data exists      Latest Ref Rng & Units 02/09/2023 06/14/2023 09/11/2023 09/18/2023 12/18/2023  Labs for ITP Cardiac and Pulmonary Rehab  Cholestrol 0 - 200 mg/dL - 780  822  - 863   LDL (calc) 0 - 99 mg/dL - 860  896  - 61   Direct LDL mg/dL - 842.9  - - 27.9   HDL-C >39.00 mg/dL - 49.79  46  - 49.59   Trlycerides 0.0 - 149.0 mg/dL - 848.9  837  - 872.9   Hemoglobin A1c 4.6 - 6.5 % 6.2  6.2  - 5.8  6.0      Exercise  Target Goals: Exercise Program Goal: Individual exercise prescription set using results from initial 6 min walk test and THRR while considering  patients activity barriers and safety.   Exercise Prescription Goal: Initial exercise prescription builds to 30-45 minutes a day of aerobic activity, 2-3 days per week.  Home exercise guidelines will be given to patient during program as part of exercise prescription that the participant will acknowledge.   Education: Aerobic Exercise: - Group verbal and visual presentation on the components of exercise prescription. Introduces F.I.T.T principle from ACSM for exercise prescriptions.  Reviews F.I.T.T. principles of aerobic exercise including progression. Written material provided at class time. Flowsheet Row Cardiac Rehab from 11/14/2023 in [facility] Cardiac and Pulmonary Rehab  Education need identified 10/15/23    Education: Resistance Exercise: - Group verbal and visual presentation on the components of exercise prescription. Introduces F.I.T.T principle from ACSM for exercise prescriptions  Reviews F.I.T.T. principles of resistance exercise including progression. Written material provided at class time.    Education: Exercise & Equipment Safety: - Individual verbal instruction and demonstration of equipment use and safety with use of the equipment. Flowsheet Row Cardiac Rehab from 11/14/2023 in [facility] Cardiac and Pulmonary Rehab  Date 10/15/23  Educator MB  Instruction Review Code 1- Verbalizes Understanding    Education: Exercise Physiology & General Exercise Guidelines: - Group verbal and written instruction with models to review the exercise physiology of the cardiovascular system and associated critical values. Provides general exercise guidelines with specific guidelines to those with heart or lung disease. Written material provided at class time.   Education: Flexibility, Balance, Mind/Body Relaxation: - Group verbal and visual presentation  with interactive activity on the components of exercise prescription. Introduces F.I.T.T principle from ACSM for exercise prescriptions. Reviews F.I.T.T. principles of flexibility and balance exercise training including progression. Also discusses the mind body connection.  Reviews various relaxation techniques to help reduce and manage stress (i.e. Deep breathing, progressive muscle relaxation, and visualization). Balance handout provided to take home. Written material provided at class time.   Activity Barriers & Risk Stratification:  Activity Barriers & Cardiac Risk Stratification - 10/15/23 1158       Activity Barriers & Cardiac Risk Stratification   Activity Barriers Arthritis;Other (comment);Deconditioning    Comments Neuropathy    Cardiac Risk Stratification Moderate          6 Minute Walk:  6 Minute Walk     Row Name 10/15/23 1157         6 Minute Walk   Phase Initial     Distance 1070 feet     Walk Time 6 minutes     # of Rest Breaks 0     MPH 2.03     METS 2.02     RPE 15     Perceived Dyspnea  2     VO2 Peak 7.06     Symptoms Yes (comment)     Comments SOB     Resting HR 68 bpm     Resting BP 116/74     Resting Oxygen Saturation  97 %     Exercise Oxygen Saturation  during 6 min walk 100 %     Max Ex. HR 109 bpm     Max Ex. BP 144/70     2 Minute Post BP 110/64        Oxygen Initial Assessment:   Oxygen Re-Evaluation:   Oxygen Discharge (Final Oxygen Re-Evaluation):   Initial Exercise Prescription:  Initial Exercise Prescription - 10/15/23 1200       Date of Initial Exercise RX and Referring Provider   Date 10/15/23    Referring Provider Darron Grass, MD      Oxygen   Maintain Oxygen Saturation 88% or higher      Recumbant Bike   Level 1    RPM 5    Watts 15    Minutes 15    METs 2.02      NuStep   Level 1    SPM 80    Minutes 15    METs 2.02      Arm Ergometer   Level 1    RPM 25    Minutes 15    METs 2.02       Biostep-RELP   Level 1    SPM 50    Minutes 15    METs 2.02      Track   Laps 18    Minutes 15    METs 1.98      Prescription Details   Frequency (times per week) 2    Duration Progress to 30 minutes of continuous aerobic without signs/symptoms of physical distress      Intensity  THRR 40-80% of Max Heartrate 97-126    Ratings of Perceived Exertion 11-13    Perceived Dyspnea 0-4      Progression   Progression Continue to progress workloads to maintain intensity without signs/symptoms of physical distress.      Resistance Training   Training Prescription Yes    Weight 2 lb    Reps 10-15          Perform Capillary Blood Glucose checks as needed.  Exercise Prescription Changes:   Exercise Prescription Changes     Row Name 10/15/23 1200 10/22/23 1100 11/09/23 0700 11/12/23 1000 11/22/23 1600     Response to Exercise   Blood Pressure (Admit) 116/74 108/72 128/72 -- 102/60   Blood Pressure (Exercise) 144/70 140/70 142/72 -- 130/80   Blood Pressure (Exit) 110/64 120/70 104/62 -- 118/80   Heart Rate (Admit) 68 bpm 74 bpm 85 bpm -- 72 bpm   Heart Rate (Exercise) 109 bpm 98 bpm 116 bpm -- 120 bpm   Heart Rate (Exit) 77 bpm 79 bpm 83 bpm -- 86 bpm   Oxygen Saturation (Admit) 97 % -- -- -- --   Oxygen Saturation (Exercise) 100 % -- -- -- --   Oxygen Saturation (Exit) 97 % -- -- -- --   Rating of Perceived Exertion (Exercise) 15 15 15  -- 15   Perceived Dyspnea (Exercise) 2 -- -- -- --   Symptoms SOB none none -- none   Comments results first 2 weeks of exercise -- -- --   Duration -- Progress to 30 minutes of  aerobic without signs/symptoms of physical distress Continue with 30 min of aerobic exercise without signs/symptoms of physical distress. -- Continue with 30 min of aerobic exercise without signs/symptoms of physical distress.   Intensity -- THRR unchanged THRR unchanged -- THRR unchanged     Progression   Progression -- Continue to progress workloads to  maintain intensity without signs/symptoms of physical distress. Continue to progress workloads to maintain intensity without signs/symptoms of physical distress. -- Continue to progress workloads to maintain intensity without signs/symptoms of physical distress.   Average METs 2.02 2.02 2.07 -- 2.05     Resistance Training   Training Prescription -- Yes Yes -- Yes   Weight -- 2 lb 2 lb -- 2 lb   Reps -- 10-15 10-15 -- 10-15     Interval Training   Interval Training -- No No -- No     Recumbant Bike   Level -- 1.2 3 -- 4   Watts -- 15 25 -- 32   Minutes -- 15 15 -- 15   METs -- 2.54 2.53 -- 2.53     NuStep   Level -- 1 3 -- 4   Minutes -- 15 15 -- 15   METs -- 1.5 2 -- 2     Track   Laps -- -- 24 -- 11   Minutes -- -- 15 -- 15   METs -- -- 2.31 -- 1.6     Home Exercise Plan   Plans to continue exercise at -- -- -- Home (comment)  Romero plans to walk outside in her neighborhood with her husband, it is about a 2 mile loop. Home (comment)  Alveria plans to walk outside in her neighborhood with her husband, it is about a 2 mile loop.   Frequency -- -- -- Add 3 additional days to program exercise sessions. Add 3 additional days to program exercise sessions.   Initial Home Exercises Provided -- -- --  11/12/23 11/12/23     Oxygen   Maintain Oxygen Saturation -- 88% or higher 88% or higher 88% or higher 88% or higher    Row Name 12/06/23 0800 12/18/23 1500           Response to Exercise   Blood Pressure (Admit) 102/60 110/74      Blood Pressure (Exercise) 130/88 --      Blood Pressure (Exit) 100/64 100/62      Heart Rate (Admit) 76 bpm 73 bpm      Heart Rate (Exercise) 108 bpm 114 bpm      Heart Rate (Exit) 86 bpm 82 bpm      Rating of Perceived Exertion (Exercise) 15 15      Symptoms none none      Duration Continue with 30 min of aerobic exercise without signs/symptoms of physical distress. Continue with 30 min of aerobic exercise without signs/symptoms of physical distress.       Intensity THRR unchanged THRR unchanged        Progression   Progression Continue to progress workloads to maintain intensity without signs/symptoms of physical distress. Continue to progress workloads to maintain intensity without signs/symptoms of physical distress.      Average METs 2.09 2.02        Resistance Training   Training Prescription Yes Yes      Weight 2 lb 2 lb      Reps 10-15 10-15        Interval Training   Interval Training No No        Recumbant Bike   Level 2.5 2      Watts 15 15      Minutes 15 15      METs 2.53 2.52        NuStep   Level 5 3      Minutes 15 15      METs 1.9 2.3        T5 Nustep   Level 1 1      SPM 80 --      Minutes 15 15      METs 2 1.8        Biostep-RELP   Level -- 1      Minutes -- 15      METs -- 2        Track   Laps 10 --      Minutes 15 --      METs 1.54 --        Home Exercise Plan   Plans to continue exercise at Home (comment)  Romero plans to walk outside in her neighborhood with her husband, it is about a 2 mile loop. Home (comment)  Maribell plans to walk outside in her neighborhood with her husband, it is about a 2 mile loop.      Frequency Add 3 additional days to program exercise sessions. Add 3 additional days to program exercise sessions.      Initial Home Exercises Provided 11/12/23 11/12/23        Oxygen   Maintain Oxygen Saturation 88% or higher 88% or higher         Exercise Comments:   Exercise Comments     Row Name 10/16/23 0735           Exercise Comments First full day of exercise!  Patient was oriented to gym and equipment including functions, settings, policies, and procedures.  Patient's individual exercise prescription and treatment plan were reviewed.  All starting workloads were established based on the results of the 6 minute walk test done at initial orientation visit.  The plan for exercise progression was also introduced and progression will be customized based on patient's  performance and goals.          Exercise Goals and Review:   Exercise Goals     Row Name 10/15/23 1203             Exercise Goals   Increase Physical Activity Yes       Intervention Provide advice, education, support and counseling about physical activity/exercise needs.;Develop an individualized exercise prescription for aerobic and resistive training based on initial evaluation findings, risk stratification, comorbidities and participant's personal goals.       Expected Outcomes Short Term: Attend rehab on a regular basis to increase amount of physical activity.;Long Term: Add in home exercise to make exercise part of routine and to increase amount of physical activity.;Long Term: Exercising regularly at least 3-5 days a week.       Increase Strength and Stamina Yes       Intervention Provide advice, education, support and counseling about physical activity/exercise needs.;Develop an individualized exercise prescription for aerobic and resistive training based on initial evaluation findings, risk stratification, comorbidities and participant's personal goals.       Expected Outcomes Short Term: Increase workloads from initial exercise prescription for resistance, speed, and METs.;Short Term: Perform resistance training exercises routinely during rehab and add in resistance training at home;Long Term: Improve cardiorespiratory fitness, muscular endurance and strength as measured by increased METs and functional capacity ( )       Able to understand and use rate of perceived exertion (RPE) scale Yes       Intervention Provide education and explanation on how to use RPE scale       Expected Outcomes Short Term: Able to use RPE daily in rehab to express subjective intensity level;Long Term:  Able to use RPE to guide intensity level when exercising independently       Able to understand and use Dyspnea scale Yes       Intervention Provide education and explanation on how to use Dyspnea scale        Expected Outcomes Short Term: Able to use Dyspnea scale daily in rehab to express subjective sense of shortness of breath during exertion;Long Term: Able to use Dyspnea scale to guide intensity level when exercising independently       Knowledge and understanding of Target Heart Rate Range (THRR) Yes       Intervention Provide education and explanation of THRR including how the numbers were predicted and where they are located for reference       Expected Outcomes Short Term: Able to state/look up THRR;Short Term: Able to use daily as guideline for intensity in rehab;Long Term: Able to use THRR to govern intensity when exercising independently       Able to check pulse independently Yes       Intervention Provide education and demonstration on how to check pulse in carotid and radial arteries.;Review the importance of being able to check your own pulse for safety during independent exercise       Expected Outcomes Short Term: Able to explain why pulse checking is important during independent exercise;Long Term: Able to check pulse independently and accurately       Understanding of Exercise Prescription Yes       Intervention Provide education, explanation, and written materials on patient's  individual exercise prescription       Expected Outcomes Short Term: Able to explain program exercise prescription;Long Term: Able to explain home exercise prescription to exercise independently          Exercise Goals Re-Evaluation :  Exercise Goals Re-Evaluation     Row Name 10/16/23 0735 10/22/23 1153 11/09/23 0758 11/12/23 1049 11/22/23 1623     Exercise Goal Re-Evaluation   Exercise Goals Review Increase Physical Activity;Understanding of Exercise Prescription;Knowledge and understanding of Target Heart Rate Range (THRR);Able to understand and use rate of perceived exertion (RPE) scale;Increase Strength and Stamina;Able to understand and use Dyspnea scale;Able to check pulse independently Increase  Physical Activity;Increase Strength and Stamina;Understanding of Exercise Prescription Increase Physical Activity;Increase Strength and Stamina;Understanding of Exercise Prescription Increase Physical Activity;Increase Strength and Stamina;Understanding of Exercise Prescription;Able to understand and use rate of perceived exertion (RPE) scale;Able to understand and use Dyspnea scale;Knowledge and understanding of Target Heart Rate Range (THRR);Able to check pulse independently Increase Physical Activity;Increase Strength and Stamina;Understanding of Exercise Prescription   Comments Reviewed RPE and dyspnea scale, THR and program prescription with pt today.  Pt voiced understanding and was given a copy of goals to take home. Omer is off to a great start in rehab, and was able to attend her first session during this review period. During her session she was able to use the recumbent bike at level 1.2, and T4 nustep at level 1. We will continue to moniotr her progress in the program. Davona is doing well in the program. She recently walked up to 24 laps on the track. She also improved to level 3 on both the T4 nustep and the recumbent bike. We will continue to monitor her progress in the program. Reviewed home exercise with pt today.  Pt plans to walk outside in her neighborhood for exercise.  Reviewed THR, pulse, RPE, sign and symptoms, pulse oximetery and when to call 911 or MD.  Also discussed weather considerations and indoor options.  Pt voiced understanding. Carleen is doing well in rehab. She was recently able to increase her level on both the T4 nustep and recumbent bike to level 4. She was also able to walk 11 laps on the track. We will continue to monitor her progress in the program.   Expected Outcomes Short: Use RPE daily to regulate intensity. Long: Follow program prescription in THR. Short: Continue to follow exercise prescription. Long: Continue to improve strength and stamina. Short: Continue to  progressively increase workloads. Long: Continue to improve strength and stamina. Short: Implement home exercise into her weekly exercise prescription. Long: Continue exercise to improve strength and stamina. Short: Continue to increase workloads on the recumbent bike and T4 nustep. Long: Continue exercise to improve strength and stamina.    Row Name 12/06/23 0848 12/10/23 0957 12/18/23 1511         Exercise Goal Re-Evaluation   Exercise Goals Review Increase Physical Activity;Increase Strength and Stamina;Understanding of Exercise Prescription Increase Physical Activity;Increase Strength and Stamina;Improve claudication pain tolerance and improve walking ability Increase Physical Activity;Increase Strength and Stamina;Improve claudication pain tolerance and improve walking ability     Comments Tiaria is doing well in rehab. She increased to level 5 on the T4 nustep. She was able to walk 10 laps on the track. She added the T5 at level 1. She decreased on the recumbent bike to level 2.5 from 4. We will continue to monitor her progress in the program. Thelda reports that she is trying to do some  walking at home on off days for rehab. She reports that she feels much stronger and her stamina has improved since starting the program. Esty is doing well in rehab. She maintained level 2 on the recumbent bike, level 1 on the biostep, and level 1 on the T5 nustep. She worked at level 3 on the T4 nustep. We will continue to monitor her progress in the program.     Expected Outcomes Short: Get back to level 4 on recumbent bike. Long: Continue exercise to improve strength and stamina. Short: continue to exercise at home on off days of rehab. Long: maintain exercise routine upon graduation from cardiac rehab. Short: Try level 2 on the T5 nustep and biostep. Long: Continue exercise to improve strength and stamina.        Discharge Exercise Prescription (Final Exercise Prescription Changes):  Exercise Prescription  Changes - 12/18/23 1500       Response to Exercise   Blood Pressure (Admit) 110/74    Blood Pressure (Exit) 100/62    Heart Rate (Admit) 73 bpm    Heart Rate (Exercise) 114 bpm    Heart Rate (Exit) 82 bpm    Rating of Perceived Exertion (Exercise) 15    Symptoms none    Duration Continue with 30 min of aerobic exercise without signs/symptoms of physical distress.    Intensity THRR unchanged      Progression   Progression Continue to progress workloads to maintain intensity without signs/symptoms of physical distress.    Average METs 2.02      Resistance Training   Training Prescription Yes    Weight 2 lb    Reps 10-15      Interval Training   Interval Training No      Recumbant Bike   Level 2    Watts 15    Minutes 15    METs 2.52      NuStep   Level 3    Minutes 15    METs 2.3      T5 Nustep   Level 1    Minutes 15    METs 1.8      Biostep-RELP   Level 1    Minutes 15    METs 2      Home Exercise Plan   Plans to continue exercise at Home (comment)   Amori plans to walk outside in her neighborhood with her husband, it is about a 2 mile loop.   Frequency Add 3 additional days to program exercise sessions.    Initial Home Exercises Provided 11/12/23      Oxygen   Maintain Oxygen Saturation 88% or higher          Nutrition:  Target Goals: Understanding of nutrition guidelines, daily intake of sodium 1500mg , cholesterol 200mg , calories 30% from fat and 7% or less from saturated fats, daily to have 5 or more servings of fruits and vegetables.  Education: Nutrition 1 -Group instruction provided by verbal, written material, interactive activities, discussions, models, and posters to present general guidelines for heart healthy nutrition including macronutrients, label reading, and promoting whole foods over processed counterparts. Education serves as pensions consultant of discussion of heart healthy eating for all. Written material provided at class time. Flowsheet  Row Cardiac Rehab from 11/14/2023 in [facility] Cardiac and Pulmonary Rehab  Date 11/08/23  Educator jg  Instruction Review Code 1- Verbalizes Understanding     Education: Nutrition 2 -Group instruction provided by verbal, written material, interactive activities, discussions, models, and posters to  present general guidelines for heart healthy nutrition including sodium, cholesterol, and saturated fat. Providing guidance of habit forming to improve blood pressure, cholesterol, and body weight. Written material provided at class time. Flowsheet Row Cardiac Rehab from 11/14/2023 in [facility] Cardiac and Pulmonary Rehab  Date 11/14/23  Educator jg  Instruction Review Code 1- Verbalizes Understanding      Biometrics:  Pre Biometrics - 10/15/23 1204       Pre Biometrics   Height 5' 6.6 (1.692 m)    Weight 192 lb 8 oz (87.3 kg)    Waist Circumference 40.5 inches    Hip Circumference 47 inches    Waist to Hip Ratio 0.86 %    BMI (Calculated) 30.5    Single Leg Stand 3.2 seconds           Nutrition Therapy Plan and Nutrition Goals:  Nutrition Therapy & Goals - 12/17/23 1343       Nutrition Therapy   Diet Cardiac, Low Na, Carb controlled    Protein (specify units) 70-90    Fiber 25 grams    Whole Grain Foods 3 servings    Saturated Fats 15 max. grams    Fruits and Vegetables 5 servings/day    Sodium 2 grams      Personal Nutrition Goals   Nutrition Goal Eat 15-30gProtein and 30-60gCarbs at each meal.    Personal Goal #2 cut back on snacking and keep sugar under 50g per day    Personal Goal #3 Read labels and reduce sodium intake to below 2300mg . Ideally 1500mg  per day.    Comments Brihana reports she has been through a lot in the past year or so. She had heart attack with 3 stents placed. Then had a GI bleed and bulging disks which caused 2 pinched nerves. She went to PT and then cardiac rehab, she reports those two programs have helped a lot with breathing and mobility but she still  has pain. She says she lost ~30lbs over the course of these events but says the past 3 months she has gained ~10lbs. Spoke with her about making sure her weight loss is healthy and she is not losing muscle mass or strength. She has attended both RD classes and remembers much of what what taught. She meets with RD today for additional clarity on facts labels and sugar intake. Ways to break habits of eating sweet foods after every meal. Reviewed these topics and brainstormed healthier snacks with less calories and sugar.      Intervention Plan   Intervention Prescribe, educate and counsel regarding individualized specific dietary modifications aiming towards targeted core components such as weight, hypertension, lipid management, diabetes, heart failure and other comorbidities.;Nutrition handout(s) given to patient.    Expected Outcomes Short Term Goal: Understand basic principles of dietary content, such as calories, fat, sodium, cholesterol and nutrients.;Short Term Goal: A plan has been developed with personal nutrition goals set during dietitian appointment.;Long Term Goal: Adherence to prescribed nutrition plan.          Nutrition Assessments:  MEDIFICTS Score Key: >=70 Need to make dietary changes  40-70 Heart Healthy Diet <= 40 Therapeutic Level Cholesterol Diet  Flowsheet Row Cardiac Rehab from 10/15/2023 in [facility] Cardiac and Pulmonary Rehab  Picture Your Plate Total Score on Admission 70   Picture Your Plate Scores: <59 Unhealthy dietary pattern with much room for improvement. 41-50 Dietary pattern unlikely to meet recommendations for good health and room for improvement. 51-60 More healthful dietary pattern, with some  room for improvement.  >60 Healthy dietary pattern, although there may be some specific behaviors that could be improved.    Nutrition Goals Re-Evaluation:  Nutrition Goals Re-Evaluation     Row Name 11/19/23 0950 12/10/23 0953           Goals   Comment Lilliana  stated that she is interested in meeting with RD but that she is still not ready. She stated she would like to meet with him around the first of December. RD schedule does not go out that far so patient was reminded to let staff know when she is ready to schedule that appointment. Kendyll has a RD apt with program RD on December 1st.      Expected Outcome Short: meet with RD to set nutrition goals. Long: work on nurse, mental health by RD. Short: meet with RD to establish nutrition goals. Long: work on nurse, mental health by RD.         Nutrition Goals Discharge (Final Nutrition Goals Re-Evaluation):  Nutrition Goals Re-Evaluation - 12/10/23 0953       Goals   Comment Verlee has a RD apt with program RD on December 1st.    Expected Outcome Short: meet with RD to establish nutrition goals. Long: work on nurse, mental health by RD.          Psychosocial: Target Goals: Acknowledge presence or absence of significant depression and/or stress, maximize coping skills, provide positive support system. Participant is able to verbalize types and ability to use techniques and skills needed for reducing stress and depression.   Education: Stress, Anxiety, and Depression - Group verbal and visual presentation to define topics covered.  Reviews how body is impacted by stress, anxiety, and depression.  Also discusses healthy ways to reduce stress and to treat/manage anxiety and depression. Written material provided at class time.   Education: Sleep Hygiene -Provides group verbal and written instruction about how sleep can affect your health.  Define sleep hygiene, discuss sleep cycles and impact of sleep habits. Review good sleep hygiene tips.   Initial Review & Psychosocial Screening:  Initial Psych Review & Screening - 09/12/23 1453       Initial Review   Current issues with Current Stress Concerns    Source of Stress Concerns Chronic Illness    Comments Mikki gets down since her health event and her husband is picking up the  slack.      Family Dynamics   Good Support System? Yes    Comments She can look to her husband, daughters and neighbors for support.      Barriers   Psychosocial barriers to participate in program The patient should benefit from training in stress management and relaxation.      Screening Interventions   Interventions Encouraged to exercise;Provide feedback about the scores to participant;To provide support and resources with identified psychosocial needs    Expected Outcomes Short Term goal: Utilizing psychosocial counselor, staff and physician to assist with identification of specific Stressors or current issues interfering with healing process. Setting desired goal for each stressor or current issue identified.;Long Term Goal: Stressors or current issues are controlled or eliminated.;Short Term goal: Identification and review with participant of any Quality of Life or Depression concerns found by scoring the questionnaire.;Long Term goal: The participant improves quality of Life and PHQ9 Scores as seen by post scores and/or verbalization of changes          Quality of Life Scores:   Quality of Life - 10/15/23 1205  Quality of Life   Select Quality of Life      Quality of Life Scores   Health/Function Pre 12.86 %    Socioeconomic Pre 30 %    Psych/Spiritual Pre 18.86 %    Family Pre 26.4 %    GLOBAL Pre 19.5 %         Scores of 19 and below usually indicate a poorer quality of life in these areas.  A difference of  2-3 points is a clinically meaningful difference.  A difference of 2-3 points in the total score of the Quality of Life Index has been associated with significant improvement in overall quality of life, self-image, physical symptoms, and general health in studies assessing change in quality of life.  PHQ-9: Review Flowsheet  More data exists      12/25/2023 12/10/2023 11/14/2023 10/15/2023 09/18/2023  Depression screen PHQ 2/9  Decreased Interest 0 0 0 0 1   Down, Depressed, Hopeless 1 0 0 1 1  PHQ - 2 Score 1 0 0 1 2  Altered sleeping 0 0 0 0 0  Tired, decreased energy 3 3 3 3 1   Change in appetite 0 0 1 0 0  Feeling bad or failure about yourself  1 0 1 1 1   Trouble concentrating 0 0 0 0 0  Moving slowly or fidgety/restless 0 0 0 0 0  Suicidal thoughts 0 0 0 0 0  PHQ-9 Score 5 3 5  5  4    Difficult doing work/chores Somewhat difficult Somewhat difficult Somewhat difficult Somewhat difficult Somewhat difficult    Details       Data saved with a previous flowsheet row definition        Interpretation of Total Score  Total Score Depression Severity:  1-4 = Minimal depression, 5-9 = Mild depression, 10-14 = Moderate depression, 15-19 = Moderately severe depression, 20-27 = Severe depression   Psychosocial Evaluation and Intervention:  Psychosocial Evaluation - 09/12/23 1455       Psychosocial Evaluation & Interventions   Interventions Encouraged to exercise with the program and follow exercise prescription;Relaxation education;Stress management education    Comments She can look to her husband, daughters and neighbors for support.Nelline gets down since her health event and her husband is picking up the slack.    Expected Outcomes Short: Start [facility] to help with mood. Long: Maintain a healthy mental state    Continue Psychosocial Services  Follow up required by staff          Psychosocial Re-Evaluation:  Psychosocial Re-Evaluation     Row Name 11/14/23 [phone] 12/10/23 0951           Psychosocial Re-Evaluation   Current issues with Current Psychotropic Meds;Current Anxiety/Panic Current Psychotropic Meds;Current Anxiety/Panic      Comments Reviewed patient health questionnaire (PHQ-9) with patient for follow up. Previously, patients score indicated signs/symptoms of depression.  Reviewed to see if patient is improving symptom wise while in program.  Score stayed the same and patient states that it is because she has a lack  of energy daily. Patient re-did her PHQ 9 and went down from a 5 to a 3, which shows improvement. He reports that she has no changes in sleep, stress, or mental health. She sleeps well with her C-PAP machine. She also reports that she continues to have a strong support system.      Expected Outcomes Short: Continue to work toward an improvement in PHQ9 scores by attending [facility] regularly. Long: Continue to  improve stress and depression coping skills by talking with staff and attendin [facility] regularly and work toward a positive mental state. Short: continue to attend cardiac rehab for the mental health benefits of exercise. Long: maintain good mental health routine.      Interventions Encouraged to attend Cardiac Rehabilitation for the exercise Encouraged to attend Cardiac Rehabilitation for the exercise      Continue Psychosocial Services  Follow up required by staff Follow up required by staff         Psychosocial Discharge (Final Psychosocial Re-Evaluation):  Psychosocial Re-Evaluation - 12/10/23 0951       Psychosocial Re-Evaluation   Current issues with Current Psychotropic Meds;Current Anxiety/Panic    Comments Patient re-did her PHQ 9 and went down from a 5 to a 3, which shows improvement. He reports that she has no changes in sleep, stress, or mental health. She sleeps well with her C-PAP machine. She also reports that she continues to have a strong support system.    Expected Outcomes Short: continue to attend cardiac rehab for the mental health benefits of exercise. Long: maintain good mental health routine.    Interventions Encouraged to attend Cardiac Rehabilitation for the exercise    Continue Psychosocial Services  Follow up required by staff          Vocational Rehabilitation: Provide vocational rehab assistance to qualifying candidates.   Vocational Rehab Evaluation & Intervention:   Education: Education Goals: Education classes will be provided on a variety of  topics geared toward better understanding of heart health and risk factor modification. Participant will state understanding/return demonstration of topics presented as noted by education test scores.  Learning Barriers/Preferences:  Learning Barriers/Preferences - 09/12/23 1451       Learning Barriers/Preferences   Learning Barriers None    Learning Preferences None          General Cardiac Education Topics:  AED/CPR: - Group verbal and written instruction with the use of models to demonstrate the basic use of the AED with the basic ABC's of resuscitation.   Test and Procedures: - Group verbal and visual presentation and models provide information about basic cardiac anatomy and function. Reviews the testing methods done to diagnose heart disease and the outcomes of the test results. Describes the treatment choices: Medical Management, Angioplasty, or Coronary Bypass Surgery for treating various heart conditions including Myocardial Infarction, Angina, Valve Disease, and Cardiac Arrhythmias. Written material provided at class time. Flowsheet Row Cardiac Rehab from 11/14/2023 in [facility] Cardiac and Pulmonary Rehab  Education need identified 10/15/23    Medication Safety: - Group verbal and visual instruction to review commonly prescribed medications for heart and lung disease. Reviews the medication, class of the drug, and side effects. Includes the steps to properly store meds and maintain the prescription regimen. Written material provided at class time.   Intimacy: - Group verbal instruction through game format to discuss how heart and lung disease can affect sexual intimacy. Written material provided at class time.   Know Your Numbers and Heart Failure: - Group verbal and visual instruction to discuss disease risk factors for cardiac and pulmonary disease and treatment options.  Reviews associated critical values for Overweight/Obesity, Hypertension, Cholesterol, and Diabetes.   Discusses basics of heart failure: signs/symptoms and treatments.  Introduces Heart Failure Zone chart for action plan for heart failure. Written material provided at class time. Flowsheet Row Cardiac Rehab from 11/14/2023 in [facility] Cardiac and Pulmonary Rehab  Education need identified 10/15/23    Infection  Prevention: - Provides verbal and written material to individual with discussion of infection control including proper hand washing and proper equipment cleaning during exercise session. Flowsheet Row Cardiac Rehab from 11/14/2023 in [facility] Cardiac and Pulmonary Rehab  Date 10/15/23  Educator MB  Instruction Review Code 1- Verbalizes Understanding    Falls Prevention: - Provides verbal and written material to individual with discussion of falls prevention and safety. Flowsheet Row Cardiac Rehab from 11/14/2023 in [facility] Cardiac and Pulmonary Rehab  Date 10/15/23  Educator MB  Instruction Review Code 1- Verbalizes Understanding    Other: -Provides group and verbal instruction on various topics (see comments)   Knowledge Questionnaire Score:  Knowledge Questionnaire Score - 10/15/23 1207       Knowledge Questionnaire Score   Pre Score 22/26          Core Components/Risk Factors/Patient Goals at Admission:  Personal Goals and Risk Factors at Admission - 10/15/23 1207       Core Components/Risk Factors/Patient Goals on Admission    Weight Management Yes;Weight Loss    Intervention Weight Management: Develop a combined nutrition and exercise program designed to reach desired caloric intake, while maintaining appropriate intake of nutrient and fiber, sodium and fats, and appropriate energy expenditure required for the weight goal.;Weight Management: Provide education and appropriate resources to help participant work on and attain dietary goals.;Weight Management/Obesity: Establish reasonable short term and long term weight goals.    Admit Weight 192 lb 8 oz (87.3 kg)    Goal  Weight: Short Term 180 lb (81.6 kg)    Goal Weight: Long Term 160 lb (72.6 kg)    Expected Outcomes Short Term: Continue to assess and modify interventions until short term weight is achieved;Long Term: Adherence to nutrition and physical activity/exercise program aimed toward attainment of established weight goal;Weight Loss: Understanding of general recommendations for a balanced deficit meal plan, which promotes 1-2 lb weight loss per week and includes a negative energy balance of [phone] kcal/d;Understanding recommendations for meals to include 15-35% energy as protein, 25-35% energy from fat, 35-60% energy from carbohydrates, less than 200mg  of dietary cholesterol, 20-35 gm of total fiber daily;Understanding of distribution of calorie intake throughout the day with the consumption of 4-5 meals/snacks    Diabetes Yes   Diet controlled   Intervention Provide education about signs/symptoms and action to take for hypo/hyperglycemia.;Provide education about proper nutrition, including hydration, and aerobic/resistive exercise prescription along with prescribed medications to achieve blood glucose in normal ranges: Fasting glucose 65-99 mg/dL    Expected Outcomes Short Term: Participant verbalizes understanding of the signs/symptoms and immediate care of hyper/hypoglycemia, proper foot care and importance of medication, aerobic/resistive exercise and nutrition plan for blood glucose control.;Long Term: Attainment of HbA1C < 7%.    Hypertension Yes    Intervention Provide education on lifestyle modifcations including regular physical activity/exercise, weight management, moderate sodium restriction and increased consumption of fresh fruit, vegetables, and low fat dairy, alcohol moderation, and smoking cessation.;Monitor prescription use compliance.    Expected Outcomes Short Term: Continued assessment and intervention until BP is < 140/77mm HG in hypertensive participants. < 130/64mm HG in hypertensive  participants with diabetes, heart failure or chronic kidney disease.;Long Term: Maintenance of blood pressure at goal levels.    Lipids Yes    Intervention Provide education and support for participant on nutrition & aerobic/resistive exercise along with prescribed medications to achieve LDL 70mg , HDL >40mg .    Expected Outcomes Short Term: Participant states understanding of desired cholesterol values and  is compliant with medications prescribed. Participant is following exercise prescription and nutrition guidelines.;Long Term: Cholesterol controlled with medications as prescribed, with individualized exercise RX and with personalized nutrition plan. Value goals: LDL < 70mg , HDL > 40 mg.          Education:Diabetes - Individual verbal and written instruction to review signs/symptoms of diabetes, desired ranges of glucose level fasting, after meals and with exercise. Acknowledge that pre and post exercise glucose checks will be done for 3 sessions at entry of program. Flowsheet Row Cardiac Rehab from 11/14/2023 in [facility] Cardiac and Pulmonary Rehab  Date 10/15/23  Educator MB  Instruction Review Code 1- Verbalizes Understanding  [Diet controlled]    Core Components/Risk Factors/Patient Goals Review:   Goals and Risk Factor Review     Row Name 11/14/23 0822 12/10/23 0954           Core Components/Risk Factors/Patient Goals Review   Personal Goals Review Other Hypertension;Lipids;Diabetes      Review Tariana would like to speak with her doctor about her shoulder pain and sometimes shortness of breath. She is taking Plavix  and informed patient to speak with her doctor of possible side effects of the medication. Amrie reports that she monitors her blood pressure and blood sugar at home and it has been good. She does not take any medication from BP or DM but manages it with diet and lifestyle. She does take cholesterol medicaiton and follows up with her doctor every 3 months to for lab work to  monitor cholesterol levels.      Expected Outcomes Short: speak with doctor about her medication. Long: maintain medication independently. Short: continue to check BP and blood sugars at home. Long: control cardiac risk factors.         Core Components/Risk Factors/Patient Goals at Discharge (Final Review):   Goals and Risk Factor Review - 12/10/23 0954       Core Components/Risk Factors/Patient Goals Review   Personal Goals Review Hypertension;Lipids;Diabetes    Review Yalissa reports that she monitors her blood pressure and blood sugar at home and it has been good. She does not take any medication from BP or DM but manages it with diet and lifestyle. She does take cholesterol medicaiton and follows up with her doctor every 3 months to for lab work to monitor cholesterol levels.    Expected Outcomes Short: continue to check BP and blood sugars at home. Long: control cardiac risk factors.          ITP Comments:  ITP Comments     Row Name 09/12/23 1500 10/15/23 1157 10/16/23 0735 11/07/23 1005 12/05/23 0947   ITP Comments Virtual Visit completed. Patient informed on EP and RD appointment and 6 Minute walk test. Patient also informed of patient health questionnaires on My Chart. Patient Verbalizes understanding. Visit diagnosis can be found in [facility] 12/11/2023. Completed and gym orientation for cardiac rehab. Initial ITP created and sent for review to Dr. Oneil Pinal, Medical Director. First full day of exercise!  Patient was oriented to gym and equipment including functions, settings, policies, and procedures.  Patient's individual exercise prescription and treatment plan were reviewed.  All starting workloads were established based on the results of the 6 minute walk test done at initial orientation visit.  The plan for exercise progression was also introduced and progression will be customized based on patient's performance and goals. 30 Day review completed. Medical Director ITP review  done, changes made as directed, and signed approval by Medical  Director. 30 Day review completed. Medical Director ITP review done, changes made as directed, and signed approval by Medical Director.    Row Name 01/02/24 0805           ITP Comments 30 Day review completed. Medical Director ITP review done, changes made as directed, and signed approval by Medical Director.          Comments: 30 day review     [1]  Current Outpatient Medications:    albuterol  (VENTOLIN  HFA) 108 (90 Base) MCG/ACT inhaler, Inhale 1-2 puffs into the lungs every 4-6 hours as needed for cough/wheeze., Disp: 18 g, Rfl: 0   ALPRAZolam  (XANAX ) 0.25 MG tablet, Take 1 tablet (0.25 mg total) by mouth 2 (two) times daily as needed for anxiety., Disp: 30 tablet, Rfl: 0   bimatoprost  (LUMIGAN ) 0.01 % SOLN, Place 1 drop into both eyes at bedtime., Disp: 2.5 mL, Rfl: 5   budesonide  (PULMICORT ) 180 MCG/ACT inhaler, Inhale 2 puffs into the lungs 2 (two) times daily., Disp: , Rfl:    carvedilol  (COREG ) 3.125 MG tablet, Take 1 tablet (3.125 mg total) by mouth 2 (two) times daily with a meal., Disp: 180 tablet, Rfl: 3   cetirizine (ZYRTEC) 10 MG tablet, Take 10 mg by mouth daily., Disp: , Rfl:    clopidogrel  (PLAVIX ) 75 MG tablet, Take 1 tablet (75 mg total) by mouth daily., Disp: 90 tablet, Rfl: 3   EPINEPHrine 0.3 mg/0.3 mL IJ SOAJ injection, as needed., Disp: , Rfl:    ezetimibe  (ZETIA ) 10 MG tablet, Take 1 tablet (10 mg total) by mouth daily. (Patient taking differently: Take 10 mg by mouth every other day.), Disp: 90 tablet, Rfl: 3   MILK THISTLE PO, Take 1 capsule by mouth daily. , Disp: , Rfl:    nitroGLYCERIN  (NITROSTAT ) 0.4 MG SL tablet, Place 1 tablet (0.4 mg total) under the tongue every 5 (five) minutes for up to 3 doses as needed for chest pain., Disp: 25 tablet, Rfl: 0   polyethylene glycol (MIRALAX  / GLYCOLAX ) 17 g packet, Take 17 g by mouth daily as needed for mild constipation., Disp: 14 each, Rfl: 0    Probiotic Product (PROBIOTIC COMPLEX ACIDOPHILUS PO), Take 1 capsule by mouth daily., Disp: , Rfl:    rosuvastatin  (CRESTOR ) 5 MG tablet, Take 1 tablet (5 mg total) by mouth daily. (Patient taking differently: Take 5 mg by mouth every other day.), Disp: 90 tablet, Rfl: 3   sodium chloride  (OCEAN) 0.65 % SOLN nasal spray, Place 1 spray into the nose as needed., Disp: , Rfl:  [2]  Social History Tobacco Use  Smoking Status Former   Current packs/day: 0.00   Types: Cigarettes   Quit date: 01/16/1978   Years since quitting: 45.9  Smokeless Tobacco Never  Tobacco Comments   REMOTELY QUIT IN 1987 AFTER A FEW YEARS OF USE

## 2024-01-03 ENCOUNTER — Ambulatory Visit

## 2024-01-03 ENCOUNTER — Encounter

## 2024-01-03 DIAGNOSIS — Z955 Presence of coronary angioplasty implant and graft: Secondary | ICD-10-CM

## 2024-01-03 DIAGNOSIS — E538 Deficiency of other specified B group vitamins: Secondary | ICD-10-CM

## 2024-01-03 DIAGNOSIS — I214 Non-ST elevation (NSTEMI) myocardial infarction: Secondary | ICD-10-CM

## 2024-01-03 MED ORDER — CYANOCOBALAMIN 1000 MCG/ML IJ SOLN
1000.0000 ug | Freq: Once | INTRAMUSCULAR | Status: AC
  Administered 2024-01-03: 14:00:00 1000 ug via INTRAMUSCULAR

## 2024-01-03 NOTE — Addendum Note (Signed)
 Addended by: Desiraye Rolfson C on: 01/03/2024 02:18 PM   Modules accepted: Orders

## 2024-01-03 NOTE — Progress Notes (Signed)
 Daily Session Note  Patient Details  Name: [name] MRN: [id number] Date of Birth: July 15, 1945 Referring Provider:   Flowsheet Row Cardiac Rehab from 10/15/2023 in [facility] Cardiac and Pulmonary Rehab  Referring Provider Darron Grass, MD    Encounter Date: 01/03/2024  Check In:  Session Check In - 01/03/24 0934       Check-In   Supervising physician immediately available to respond to emergencies See telemetry face sheet for immediately available ER MD    Location [facility]-Cardiac & Pulmonary Rehab    Staff Present Leita Franks RN,BSN;[facility] BS, Exercise Physiologist;Margaret Best, MS, Exercise Physiologist    Virtual Visit No    Medication changes reported     No    Fall or balance concerns reported    No    Resistance Training Performed Yes    VAD Patient? No    PAD/SET Patient? No      Pain Assessment   Currently in Pain? No/denies             Tobacco Use History[1]  Goals Met:  Independence with exercise equipment Exercise tolerated well No report of concerns or symptoms today Strength training completed today  Goals Unmet:  Not Applicable  Comments: Pt able to follow exercise prescription today without complaint.  Will continue to monitor for progression.    Dr. Oneil Pinal is Medical Director for [facility] Cardiac Rehabilitation.  Dr. Fuad Aleskerov is Medical Director for [facility] Pulmonary Rehabilitation.    [1]  Social History Tobacco Use  Smoking Status Former   Current packs/day: 0.00   Types: Cigarettes   Quit date: 01/16/1978   Years since quitting: 45.9  Smokeless Tobacco Never  Tobacco Comments   REMOTELY QUIT IN 1987 AFTER A FEW YEARS OF USE

## 2024-01-03 NOTE — Progress Notes (Signed)
 Patient presented for B 12 injection to right deltoid, patient voiced no concerns nor showed any signs of distress during injection.

## 2024-01-04 LAB — INTRINSIC FACTOR ANTIBODIES: Intrinsic Factor Abs, Serum: 13.9 [AU]/ml — ABNORMAL HIGH (ref 0.0–1.1)

## 2024-01-05 ENCOUNTER — Ambulatory Visit: Payer: Self-pay | Admitting: Internal Medicine

## 2024-01-07 ENCOUNTER — Encounter

## 2024-01-07 DIAGNOSIS — Z955 Presence of coronary angioplasty implant and graft: Secondary | ICD-10-CM

## 2024-01-07 DIAGNOSIS — I214 Non-ST elevation (NSTEMI) myocardial infarction: Secondary | ICD-10-CM | POA: Diagnosis not present

## 2024-01-07 NOTE — Progress Notes (Signed)
 Daily Session Note  Patient Details  Name: [name] MRN: [id number] Date of Birth: [birth year] Referring Provider:   Flowsheet Row Cardiac Rehab from 10/15/2023 in [facility] Cardiac and Pulmonary Rehab  Referring Provider Darron Grass, MD    Encounter Date: 01/07/2024  Check In:  Session Check In - 01/07/24 9062       Check-In   Supervising physician immediately available to respond to emergencies See telemetry face sheet for immediately available ER MD    Location [facility]-Cardiac & Pulmonary Rehab    Staff Present Burnard Davenport [facility] Peggi, RN, DNP, NE-BC;Kristen Coble RN,BC,MSN;Sims Laday Dyane HECKLE, ACSM CEP, Exercise Physiologist;Laura Cates RN,BSN    Virtual Visit No    Medication changes reported     No    Fall or balance concerns reported    No    Warm-up and Cool-down Performed on first and last piece of equipment    Resistance Training Performed Yes    VAD Patient? No    PAD/SET Patient? No      Pain Assessment   Currently in Pain? No/denies             Tobacco Use History[1]  Goals Met:  Independence with exercise equipment Exercise tolerated well No report of concerns or symptoms today Strength training completed today  Goals Unmet:  Not Applicable  Comments: Pt able to follow exercise prescription today without complaint.  Will continue to monitor for progression.    Dr. Oneil Pinal is Medical Director for [facility] Cardiac Rehabilitation.  Dr. Fuad Aleskerov is Medical Director for [facility] Pulmonary Rehabilitation.    [1]  Social History Tobacco Use  Smoking Status Former   Current packs/day: 0.00   Types: Cigarettes   Quit date: 01/16/1978   Years since quitting: 46.0  Smokeless Tobacco Never  Tobacco Comments   REMOTELY QUIT IN 1987 AFTER A FEW YEARS OF USE

## 2024-01-09 ENCOUNTER — Encounter

## 2024-01-09 DIAGNOSIS — Z955 Presence of coronary angioplasty implant and graft: Secondary | ICD-10-CM

## 2024-01-09 DIAGNOSIS — I214 Non-ST elevation (NSTEMI) myocardial infarction: Secondary | ICD-10-CM | POA: Diagnosis not present

## 2024-01-09 NOTE — Progress Notes (Signed)
 Daily Session Note  Patient Details  Name: [name] MRN: [id number] Date of Birth: September 08, 1945 Referring Provider:   Flowsheet Row Cardiac Rehab from 10/15/2023 in [facility] Cardiac and Pulmonary Rehab  Referring Provider Darron Grass, MD    Encounter Date: 01/09/2024  Check In:  Session Check In - 01/09/24 0924       Check-In   Supervising physician immediately available to respond to emergencies See telemetry face sheet for immediately available ER MD    Location [facility]-Cardiac & Pulmonary Rehab    Staff Present Burnard Davenport RN,BSN,MPA;Joseph [facility] RCP,RRT,BSRT;Laura Cates RN,BSN;Noah Tickle, [place], Exercise Physiologist    Virtual Visit No    Medication changes reported     No    Fall or balance concerns reported    No    Warm-up and Cool-down Performed on first and last piece of equipment    Resistance Training Performed Yes    VAD Patient? No    PAD/SET Patient? No      Pain Assessment   Currently in Pain? No/denies             Tobacco Use History[1]  Goals Met:  Proper associated with RPD/PD & O2 Sat Independence with exercise equipment Exercise tolerated well No report of concerns or symptoms today Strength training completed today  Goals Unmet:  Not Applicable  Comments: Pt able to follow exercise prescription today without complaint.  Will continue to monitor for progression.    Dr. Oneil Pinal is Medical Director for [facility] Cardiac Rehabilitation.  Dr. Fuad Aleskerov is Medical Director for [facility] Pulmonary Rehabilitation.    [1]  Social History Tobacco Use  Smoking Status Former   Current packs/day: 0.00   Types: Cigarettes   Quit date: 01/16/1978   Years since quitting: 46.0  Smokeless Tobacco Never  Tobacco Comments   REMOTELY QUIT IN 1987 AFTER A FEW YEARS OF USE

## 2024-01-14 ENCOUNTER — Ambulatory Visit

## 2024-01-14 ENCOUNTER — Encounter

## 2024-01-14 DIAGNOSIS — I214 Non-ST elevation (NSTEMI) myocardial infarction: Secondary | ICD-10-CM

## 2024-01-14 DIAGNOSIS — Z955 Presence of coronary angioplasty implant and graft: Secondary | ICD-10-CM

## 2024-01-14 DIAGNOSIS — E538 Deficiency of other specified B group vitamins: Secondary | ICD-10-CM | POA: Diagnosis not present

## 2024-01-14 MED ORDER — CYANOCOBALAMIN 1000 MCG/ML IJ SOLN
1000.0000 ug | Freq: Once | INTRAMUSCULAR | Status: AC
  Administered 2024-01-14: 1000 ug via INTRAMUSCULAR

## 2024-01-14 NOTE — Progress Notes (Signed)
 Daily Session Note  Patient Details  Name: [name] MRN: [id number] Date of Birth: 1945-02-28 Referring Provider:   Flowsheet Row Cardiac Rehab from 10/15/2023 in [facility] Cardiac and Pulmonary Rehab  Referring Provider Darron Grass, MD    Encounter Date: 01/14/2024  Check In:  Session Check In - 01/14/24 0939       Check-In   Supervising physician immediately available to respond to emergencies See telemetry face sheet for immediately available ER MD    Location [facility]-Cardiac & Pulmonary Rehab    Staff Present Burnard Davenport RN,BSN,MPA;Joseph Hood RCP,RRT,BSRT;Maxon Burnell BS, Exercise Physiologist;Laureen Delores, BS, RRT, CPFT    Virtual Visit No    Medication changes reported     No    Fall or balance concerns reported    No    Warm-up and Cool-down Performed on first and last piece of equipment    Resistance Training Performed Yes    VAD Patient? No    PAD/SET Patient? No      Pain Assessment   Currently in Pain? No/denies             Tobacco Use History[1]  Goals Met:  Independence with exercise equipment Exercise tolerated well No report of concerns or symptoms today Strength training completed today  Goals Unmet:  Not Applicable  Comments: Pt able to follow exercise prescription today without complaint.  Will continue to monitor for progression.    Dr. Oneil Pinal is Medical Director for [facility] Cardiac Rehabilitation.  Dr. Fuad Aleskerov is Medical Director for [facility] Pulmonary Rehabilitation.    [1]  Social History Tobacco Use  Smoking Status Former   Current packs/day: 0.00   Types: Cigarettes   Quit date: 01/16/1978   Years since quitting: 46.0  Smokeless Tobacco Never  Tobacco Comments   REMOTELY QUIT IN 1987 AFTER A FEW YEARS OF USE

## 2024-01-14 NOTE — Progress Notes (Signed)
 Patient was administered a B12 injection into her right deltoid. Patient tolerated the B12 injection well. Patient would not let me do left deltoid. Patient states left deltoid hurt and it had to be the right deltoid.

## 2024-01-16 ENCOUNTER — Encounter

## 2024-01-21 ENCOUNTER — Encounter

## 2024-01-21 ENCOUNTER — Ambulatory Visit

## 2024-01-24 ENCOUNTER — Encounter: Attending: Cardiovascular Disease | Admitting: Emergency Medicine

## 2024-01-24 ENCOUNTER — Encounter

## 2024-01-24 DIAGNOSIS — I214 Non-ST elevation (NSTEMI) myocardial infarction: Secondary | ICD-10-CM | POA: Insufficient documentation

## 2024-01-24 DIAGNOSIS — Z955 Presence of coronary angioplasty implant and graft: Secondary | ICD-10-CM | POA: Diagnosis present

## 2024-01-24 DIAGNOSIS — Z48812 Encounter for surgical aftercare following surgery on the circulatory system: Secondary | ICD-10-CM | POA: Insufficient documentation

## 2024-01-24 NOTE — Progress Notes (Signed)
 Daily Session Note  Patient Details  Name: [name] MRN: [id number] Date of Birth: 1945-06-23 Referring Provider:   Flowsheet Row Cardiac Rehab from 10/15/2023 in [facility] Cardiac and Pulmonary Rehab  Referring Provider Darron Grass, MD    Encounter Date: 01/24/2024  Check In:  Session Check In - 01/24/24 0929       Check-In   Supervising physician immediately available to respond to emergencies See telemetry face sheet for immediately available ER MD    Location [facility]-Cardiac & Pulmonary Rehab    Staff Present Leita Franks RN,BSN;Joseph [facility] [place], [place], Exercise Physiologist    Virtual Visit No    Medication changes reported     No    Fall or balance concerns reported    No    Warm-up and Cool-down Performed on first and last piece of equipment    Resistance Training Performed Yes    VAD Patient? No    PAD/SET Patient? No      Pain Assessment   Currently in Pain? No/denies             Tobacco Use History[1]  Goals Met:  Independence with exercise equipment Exercise tolerated well No report of concerns or symptoms today Strength training completed today  Goals Unmet:  Not Applicable  Comments: Pt able to follow exercise prescription today without complaint.  Will continue to monitor for progression.    Dr. Oneil Pinal is Medical Director for [facility] Cardiac Rehabilitation.  Dr. Fuad Aleskerov is Medical Director for [facility] Pulmonary Rehabilitation.    [1]  Social History Tobacco Use  Smoking Status Former   Current packs/day: 0.00   Types: Cigarettes   Quit date: 01/16/1978   Years since quitting: 46.0  Smokeless Tobacco Never  Tobacco Comments   REMOTELY QUIT IN 1987 AFTER A FEW YEARS OF USE

## 2024-01-28 ENCOUNTER — Encounter

## 2024-01-28 VITALS — Ht 66.6 in | Wt 201.6 lb

## 2024-01-28 DIAGNOSIS — Z48812 Encounter for surgical aftercare following surgery on the circulatory system: Secondary | ICD-10-CM | POA: Diagnosis not present

## 2024-01-28 DIAGNOSIS — I214 Non-ST elevation (NSTEMI) myocardial infarction: Secondary | ICD-10-CM

## 2024-01-28 DIAGNOSIS — Z955 Presence of coronary angioplasty implant and graft: Secondary | ICD-10-CM

## 2024-01-28 NOTE — Patient Instructions (Signed)
 Discharge Patient Instructions  Patient Details  Name: [name] MRN: [id number] Date of Birth: [birth year] Referring Provider:  Marylynn Verneita CROME, MD   Number of Visits: 17  Reason for Discharge:  Patient reached a stable level of exercise. Patient independent in their exercise. Patient has met program and personal goals.  Smoking History:  Social History   Tobacco Use  Smoking Status Former   Current packs/day: 0.00   Types: Cigarettes   Quit date: 01/16/1978   Years since quitting: 46.0  Smokeless Tobacco Never  Tobacco Comments   REMOTELY QUIT IN 1987 AFTER A FEW YEARS OF USE    Diagnosis:  NSTEMI (non-ST elevation myocardial infarction) (HCC)  Status post coronary artery stent placement  Initial Exercise Prescription:  Initial Exercise Prescription - 10/15/23 1200       Date of Initial Exercise RX and Referring Provider   Date 10/15/23    Referring Provider Darron Grass, MD      Oxygen   Maintain Oxygen Saturation 88% or higher      Recumbant Bike   Level 1    RPM 5    Watts 15    Minutes 15    METs 2.02      NuStep   Level 1    SPM 80    Minutes 15    METs 2.02      Arm Ergometer   Level 1    RPM 25    Minutes 15    METs 2.02      Biostep-RELP   Level 1    SPM 50    Minutes 15    METs 2.02      Track   Laps 18    Minutes 15    METs 1.98      Prescription Details   Frequency (times per week) 2    Duration Progress to 30 minutes of continuous aerobic without signs/symptoms of physical distress      Intensity   THRR 40-80% of Max Heartrate 97-126    Ratings of Perceived Exertion 11-13    Perceived Dyspnea 0-4      Progression   Progression Continue to progress workloads to maintain intensity without signs/symptoms of physical distress.      Resistance Training   Training Prescription Yes    Weight 2 lb    Reps 10-15          Discharge Exercise Prescription (Final Exercise Prescription Changes):  Exercise Prescription  Changes - 01/21/24 1000       Response to Exercise   Blood Pressure (Admit) 112/68    Blood Pressure (Exit) 118/68    Heart Rate (Admit) 79 bpm    Heart Rate (Exercise) 108 bpm    Heart Rate (Exit) 98 bpm    Oxygen Saturation (Admit) 97 %    Oxygen Saturation (Exercise) 95 %    Oxygen Saturation (Exit) 97 %    Rating of Perceived Exertion (Exercise) 15    Symptoms none    Duration Continue with 30 min of aerobic exercise without signs/symptoms of physical distress.    Intensity THRR unchanged      Progression   Progression Continue to progress workloads to maintain intensity without signs/symptoms of physical distress.    Average METs 2.29      Resistance Training   Training Prescription Yes    Weight 2 lb    Reps 10-15      Interval Training   Interval Training No  Recumbant Bike   Level 2    Watts 15    Minutes 15    METs 2.52      NuStep   Level 4    Minutes 15    METs 3.1      Home Exercise Plan   Plans to continue exercise at Home (comment)   Elaine plans to walk outside in her neighborhood with her husband, it is about a 2 mile loop.   Frequency Add 3 additional days to program exercise sessions.    Initial Home Exercises Provided 11/12/23      Oxygen   Maintain Oxygen Saturation 88% or higher          Functional Capacity:  6 Minute Walk     Row Name 10/15/23 1157 01/28/24 0950       6 Minute Walk   Phase Initial Discharge    Distance 1070 feet 1170 feet    Distance % Change -- 9 %    Distance Feet Change -- 100 ft    Walk Time 6 minutes 6 minutes    # of Rest Breaks 0 0    MPH 2.03 2.22    METS 2.02 2.21    RPE 15 11    Perceived Dyspnea  2 1    VO2 Peak 7.06 7.73    Symptoms Yes (comment) No    Comments SOB --    Resting HR 68 bpm 70 bpm    Resting BP 116/74 110/68    Resting Oxygen Saturation  97 % 98 %    Exercise Oxygen Saturation  during 6 min walk 100 % 98 %    Max Ex. HR 109 bpm 120 bpm    Max Ex. BP 144/70 142/70    2  Minute Post BP 110/64 120/68       Nutrition & Weight - Outcomes:  Pre Biometrics - 10/15/23 1204       Pre Biometrics   Height 5' 6.6 (1.692 m)    Weight 192 lb 8 oz (87.3 kg)    Waist Circumference 40.5 inches    Hip Circumference 47 inches    Waist to Hip Ratio 0.86 %    BMI (Calculated) 30.5    Single Leg Stand 3.2 seconds          Post Biometrics - 01/28/24 0951        Post  Biometrics   Height 5' 6.6 (1.692 m)    Weight 201 lb 9.6 oz (91.4 kg)    Waist Circumference 39.5 inches    Hip Circumference 48 inches    Waist to Hip Ratio 0.82 %    BMI (Calculated) 31.94    Single Leg Stand 3.34 seconds          Nutrition:  Nutrition Therapy & Goals - 12/17/23 1343       Nutrition Therapy   Diet Cardiac, Low Na, Carb controlled    Protein (specify units) 70-90    Fiber 25 grams    Whole Grain Foods 3 servings    Saturated Fats 15 max. grams    Fruits and Vegetables 5 servings/day    Sodium 2 grams      Personal Nutrition Goals   Nutrition Goal Eat 15-30gProtein and 30-60gCarbs at each meal.    Personal Goal #2 cut back on snacking and keep sugar under 50g per day    Personal Goal #3 Read labels and reduce sodium intake to below 2300mg . Ideally 1500mg  per day.  Comments [name] reports she has been through a lot in the past year or so. She had heart attack with 3 stents placed. Then had a GI bleed and bulging disks which caused 2 pinched nerves. She went to PT and then cardiac rehab, she reports those two programs have helped a lot with breathing and mobility but she still has pain. She says she lost ~30lbs over the course of these events but says the past 3 months she has gained ~10lbs. Spoke with her about making sure her weight loss is healthy and she is not losing muscle mass or strength. She has attended both RD classes and remembers much of what what taught. She meets with RD today for additional clarity on facts labels and sugar intake. Ways to break habits  of eating sweet foods after every meal. Reviewed these topics and brainstormed healthier snacks with less calories and sugar.      Intervention Plan   Intervention Prescribe, educate and counsel regarding individualized specific dietary modifications aiming towards targeted core components such as weight, hypertension, lipid management, diabetes, heart failure and other comorbidities.;Nutrition handout(s) given to patient.    Expected Outcomes Short Term Goal: Understand basic principles of dietary content, such as calories, fat, sodium, cholesterol and nutrients.;Short Term Goal: A plan has been developed with personal nutrition goals set during dietitian appointment.;Long Term Goal: Adherence to prescribed nutrition plan.           Goals reviewed with patient; copy given to patient.

## 2024-01-28 NOTE — Progress Notes (Signed)
 Daily Session Note  Patient Details  Name: [name] MRN: [id number] Date of Birth: [birth year] Referring Provider:   Flowsheet Row Cardiac Rehab from 10/15/2023 in [facility] Cardiac and Pulmonary Rehab  Referring Provider Darron Grass, MD    Encounter Date: 01/28/2024  Check In:  Session Check In - 01/28/24 0917       Check-In   Supervising physician immediately available to respond to emergencies See telemetry face sheet for immediately available ER MD    Location [facility]-Cardiac & Pulmonary Rehab    Staff Present Burnard Davenport RN,BSN,MPA;Laura Cates RN,BSN;[facility] RCP,RRT,BSRT;Maxon Burnell BS, Exercise Physiologist;Vivika Poythress Dyane BS, ACSM CEP, Exercise Physiologist    Virtual Visit No    Medication changes reported     No    Fall or balance concerns reported    No    Warm-up and Cool-down Performed on first and last piece of equipment    Resistance Training Performed Yes    VAD Patient? No    PAD/SET Patient? No      Pain Assessment   Currently in Pain? No/denies             Tobacco Use History[1]  Goals Met:  Proper associated with RPD/PD & O2 Sat Independence with exercise equipment Exercise tolerated well No report of concerns or symptoms today Strength training completed today  Goals Unmet:  Not Applicable  Comments: Pt able to follow exercise prescription today without complaint.  Will continue to monitor for progression.   6 Minute Walk     Row Name 10/15/23 1157 01/28/24 0950       6 Minute Walk   Phase Initial Discharge    Distance 1070 feet 1170 feet    Distance % Change -- 9 %    Distance Feet Change -- 100 ft    Walk Time 6 minutes 6 minutes    # of Rest Breaks 0 0    MPH 2.03 2.22    METS 2.02 2.21    RPE 15 11    Perceived Dyspnea  2 1    VO2 Peak 7.06 7.73    Symptoms Yes (comment) No    Comments SOB --    Resting HR 68 bpm 70 bpm    Resting BP 116/74 110/68    Resting Oxygen Saturation  97 % 98 %    Exercise Oxygen Saturation   during 6 min walk 100 % 98 %    Max Ex. HR 109 bpm 120 bpm    Max Ex. BP 144/70 142/70    2 Minute Post BP 110/64 120/68        Dr. Oneil Pinal is Medical Director for [facility] Cardiac Rehabilitation.  Dr. Fuad Aleskerov is Medical Director for [facility] Pulmonary Rehabilitation.     [1]  Social History Tobacco Use  Smoking Status Former   Current packs/day: 0.00   Types: Cigarettes   Quit date: 01/16/1978   Years since quitting: 46.0  Smokeless Tobacco Never  Tobacco Comments   REMOTELY QUIT IN 1987 AFTER A FEW YEARS OF USE

## 2024-01-30 ENCOUNTER — Ambulatory Visit

## 2024-01-30 ENCOUNTER — Encounter: Payer: Self-pay | Admitting: *Deleted

## 2024-01-30 DIAGNOSIS — Z955 Presence of coronary angioplasty implant and graft: Secondary | ICD-10-CM

## 2024-01-30 DIAGNOSIS — E538 Deficiency of other specified B group vitamins: Secondary | ICD-10-CM | POA: Diagnosis not present

## 2024-01-30 DIAGNOSIS — I214 Non-ST elevation (NSTEMI) myocardial infarction: Secondary | ICD-10-CM

## 2024-01-30 MED ORDER — CYANOCOBALAMIN 1000 MCG/ML IJ SOLN
1000.0000 ug | Freq: Once | INTRAMUSCULAR | Status: AC
  Administered 2024-01-30: 1000 ug via INTRAMUSCULAR

## 2024-01-30 NOTE — Progress Notes (Signed)
 Patient was administered a B12 injection into her right deltoid. Patient tolerated the B12 injection well.   Patient requested the right deltoid again due to the Left deltoid is injured right now.

## 2024-01-30 NOTE — Progress Notes (Signed)
 Cardiac Individual Treatment Plan  Patient Details  Name: [name] MRN: [id number] Date of Birth: [birth year] Referring Provider:   Flowsheet Row Cardiac Rehab from 10/15/2023 in [facility] Cardiac and Pulmonary Rehab  Referring Provider Darron Grass, MD    Initial Encounter Date:  Flowsheet Row Cardiac Rehab from 10/15/2023 in [facility] Cardiac and Pulmonary Rehab  Date 10/15/23    Visit Diagnosis: NSTEMI (non-ST elevation myocardial infarction) [facility])  Status post coronary artery stent placement  Patient's Home Medications on Admission: Current Medications[1]  Past Medical History: Past Medical History:  Diagnosis Date   Acute GI bleeding 02/08/2023   Angina pectoris 04/14/2015   Asthma    Asthma due to environmental allergies    grass, mold, trees, dust   Cataract    Cellulitis    LEFT LEG   Diabetes mellitus    CONTROLLED ON DIET ALONE   Diverticulitis of colon 02/09/2023   Diverticulitis of intestine with bleeding 02/08/2023   Edema leg    LEFT LEG...CHRONIC   Endometrial hyperplasia    External hemorrhoid    Fatty liver    Glaucoma    Hepatic cyst    STABLE PER 05/20/2008 ULTRASOUND   Hypertension    borderline...controlled since taking herself of HCTZ IN JULY   IBS (irritable bowel syndrome)    Lactic acidosis 02/09/2023   Murmur, cardiac    Neuropathy    Numbness of fingers of both hands 04/17/2015   OSA on CPAP    Osteoarthritis    Psoriasis    Sleep apnea    CPAP    Tobacco Use: Tobacco Use History[2]  Labs: Review Flowsheet  More data exists      Latest Ref Rng & Units 02/09/2023 06/14/2023 09/11/2023 09/18/2023 12/18/2023  Labs for ITP Cardiac and Pulmonary Rehab  Cholestrol 0 - 200 mg/dL - 780  822  - 863   LDL (calc) 0 - 99 mg/dL - 860  896  - 61   Direct LDL mg/dL - 842.9  - - 27.9   HDL-C >39.00 mg/dL - 49.79  46  - 49.59   Trlycerides 0.0 - 149.0 mg/dL - 848.9  837  - 872.9   Hemoglobin A1c 4.6 - 6.5 % 6.2  6.2  - 5.8  6.0      Exercise  Target Goals: Exercise Program Goal: Individual exercise prescription set using results from initial 6 min walk test and THRR while considering  patients activity barriers and safety.   Exercise Prescription Goal: Initial exercise prescription builds to 30-45 minutes a day of aerobic activity, 2-3 days per week.  Home exercise guidelines will be given to patient during program as part of exercise prescription that the participant will acknowledge.   Education: Aerobic Exercise: - Group verbal and visual presentation on the components of exercise prescription. Introduces F.I.T.T principle from ACSM for exercise prescriptions.  Reviews F.I.T.T. principles of aerobic exercise including progression. Written material provided at class time. Flowsheet Row Cardiac Rehab from 11/14/2023 in [facility] Cardiac and Pulmonary Rehab  Education need identified 10/15/23    Education: Resistance Exercise: - Group verbal and visual presentation on the components of exercise prescription. Introduces F.I.T.T principle from ACSM for exercise prescriptions  Reviews F.I.T.T. principles of resistance exercise including progression. Written material provided at class time.    Education: Exercise & Equipment Safety: - Individual verbal instruction and demonstration of equipment use and safety with use of the equipment. Flowsheet Row Cardiac Rehab from 11/14/2023 in [facility] Cardiac and Pulmonary Rehab  Date 10/15/23  Educator MB  Instruction Review Code 1- Verbalizes Understanding    Education: Exercise Physiology & General Exercise Guidelines: - Group verbal and written instruction with models to review the exercise physiology of the cardiovascular system and associated critical values. Provides general exercise guidelines with specific guidelines to those with heart or lung disease. Written material provided at class time.   Education: Flexibility, Balance, Mind/Body Relaxation: - Group verbal and visual presentation  with interactive activity on the components of exercise prescription. Introduces F.I.T.T principle from ACSM for exercise prescriptions. Reviews F.I.T.T. principles of flexibility and balance exercise training including progression. Also discusses the mind body connection.  Reviews various relaxation techniques to help reduce and manage stress (i.e. Deep breathing, progressive muscle relaxation, and visualization). Balance handout provided to take home. Written material provided at class time.   Activity Barriers & Risk Stratification:  Activity Barriers & Cardiac Risk Stratification - 10/15/23 1158       Activity Barriers & Cardiac Risk Stratification   Activity Barriers Arthritis;Other (comment);Deconditioning    Comments Neuropathy    Cardiac Risk Stratification Moderate          6 Minute Walk:  6 Minute Walk     Row Name 10/15/23 1157 01/28/24 0950       6 Minute Walk   Phase Initial Discharge    Distance 1070 feet 1170 feet    Distance % Change -- 9 %    Distance Feet Change -- 100 ft    Walk Time 6 minutes 6 minutes    # of Rest Breaks 0 0    MPH 2.03 2.22    METS 2.02 2.21    RPE 15 11    Perceived Dyspnea  2 1    VO2 Peak 7.06 7.73    Symptoms Yes (comment) No    Comments SOB --    Resting HR 68 bpm 70 bpm    Resting BP 116/74 110/68    Resting Oxygen Saturation  97 % 98 %    Exercise Oxygen Saturation  during 6 min walk 100 % 98 %    Max Ex. HR 109 bpm 120 bpm    Max Ex. BP 144/70 142/70    2 Minute Post BP 110/64 120/68       Oxygen Initial Assessment:   Oxygen Re-Evaluation:   Oxygen Discharge (Final Oxygen Re-Evaluation):   Initial Exercise Prescription:  Initial Exercise Prescription - 10/15/23 1200       Date of Initial Exercise RX and Referring Provider   Date 10/15/23    Referring Provider Darron Grass, MD      Oxygen   Maintain Oxygen Saturation 88% or higher      Recumbant Bike   Level 1    RPM 5    Watts 15    Minutes 15     METs 2.02      NuStep   Level 1    SPM 80    Minutes 15    METs 2.02      Arm Ergometer   Level 1    RPM 25    Minutes 15    METs 2.02      Biostep-RELP   Level 1    SPM 50    Minutes 15    METs 2.02      Track   Laps 18    Minutes 15    METs 1.98      Prescription Details   Frequency (times per  week) 2    Duration Progress to 30 minutes of continuous aerobic without signs/symptoms of physical distress      Intensity   THRR 40-80% of Max Heartrate 97-126    Ratings of Perceived Exertion 11-13    Perceived Dyspnea 0-4      Progression   Progression Continue to progress workloads to maintain intensity without signs/symptoms of physical distress.      Resistance Training   Training Prescription Yes    Weight 2 lb    Reps 10-15          Perform Capillary Blood Glucose checks as needed.  Exercise Prescription Changes:   Exercise Prescription Changes     Row Name 10/15/23 1200 10/22/23 1100 11/09/23 0700 11/12/23 1000 11/22/23 1600     Response to Exercise   Blood Pressure (Admit) 116/74 108/72 128/72 -- 102/60   Blood Pressure (Exercise) 144/70 140/70 142/72 -- 130/80   Blood Pressure (Exit) 110/64 120/70 104/62 -- 118/80   Heart Rate (Admit) 68 bpm 74 bpm 85 bpm -- 72 bpm   Heart Rate (Exercise) 109 bpm 98 bpm 116 bpm -- 120 bpm   Heart Rate (Exit) 77 bpm 79 bpm 83 bpm -- 86 bpm   Oxygen Saturation (Admit) 97 % -- -- -- --   Oxygen Saturation (Exercise) 100 % -- -- -- --   Oxygen Saturation (Exit) 97 % -- -- -- --   Rating of Perceived Exertion (Exercise) 15 15 15  -- 15   Perceived Dyspnea (Exercise) 2 -- -- -- --   Symptoms SOB none none -- none   Comments results first 2 weeks of exercise -- -- --   Duration -- Progress to 30 minutes of  aerobic without signs/symptoms of physical distress Continue with 30 min of aerobic exercise without signs/symptoms of physical distress. -- Continue with 30 min of aerobic exercise without signs/symptoms of  physical distress.   Intensity -- THRR unchanged THRR unchanged -- THRR unchanged     Progression   Progression -- Continue to progress workloads to maintain intensity without signs/symptoms of physical distress. Continue to progress workloads to maintain intensity without signs/symptoms of physical distress. -- Continue to progress workloads to maintain intensity without signs/symptoms of physical distress.   Average METs 2.02 2.02 2.07 -- 2.05     Resistance Training   Training Prescription -- Yes Yes -- Yes   Weight -- 2 lb 2 lb -- 2 lb   Reps -- 10-15 10-15 -- 10-15     Interval Training   Interval Training -- No No -- No     Recumbant Bike   Level -- 1.2 3 -- 4   Watts -- 15 25 -- 32   Minutes -- 15 15 -- 15   METs -- 2.54 2.53 -- 2.53     NuStep   Level -- 1 3 -- 4   Minutes -- 15 15 -- 15   METs -- 1.5 2 -- 2     Track   Laps -- -- 24 -- 11   Minutes -- -- 15 -- 15   METs -- -- 2.31 -- 1.6     Home Exercise Plan   Plans to continue exercise at -- -- -- Home (comment)  Romero plans to walk outside in her neighborhood with her husband, it is about a 2 mile loop. Home (comment)  Elany plans to walk outside in her neighborhood with her husband, it is about a 2 mile loop.  Frequency -- -- -- Add 3 additional days to program exercise sessions. Add 3 additional days to program exercise sessions.   Initial Home Exercises Provided -- -- -- 11/12/23 11/12/23     Oxygen   Maintain Oxygen Saturation -- 88% or higher 88% or higher 88% or higher 88% or higher    Row Name 12/06/23 0800 12/18/23 1500 01/02/24 0900 01/21/24 1000 01/29/24 1400     Response to Exercise   Blood Pressure (Admit) 102/60 110/74 118/86 112/68 122/60   Blood Pressure (Exercise) 130/88 -- -- -- --   Blood Pressure (Exit) 100/64 100/62 106/62 118/68 114/66   Heart Rate (Admit) 76 bpm 73 bpm 78 bpm 79 bpm 73 bpm   Heart Rate (Exercise) 108 bpm 114 bpm 108 bpm 108 bpm 81 bpm   Heart Rate (Exit) 86 bpm 82  bpm 87 bpm 98 bpm 82 bpm   Oxygen Saturation (Admit) -- -- 99 % 97 % 98 %   Oxygen Saturation (Exercise) -- -- 93 % 95 % 94 %   Oxygen Saturation (Exit) -- -- 96 % 97 % 98 %   Rating of Perceived Exertion (Exercise) 15 15 15 15 13    Symptoms none none none none none   Duration Continue with 30 min of aerobic exercise without signs/symptoms of physical distress. Continue with 30 min of aerobic exercise without signs/symptoms of physical distress. Continue with 30 min of aerobic exercise without signs/symptoms of physical distress. Continue with 30 min of aerobic exercise without signs/symptoms of physical distress. Continue with 30 min of aerobic exercise without signs/symptoms of physical distress.   Intensity THRR unchanged THRR unchanged THRR unchanged THRR unchanged THRR unchanged     Progression   Progression Continue to progress workloads to maintain intensity without signs/symptoms of physical distress. Continue to progress workloads to maintain intensity without signs/symptoms of physical distress. Continue to progress workloads to maintain intensity without signs/symptoms of physical distress. Continue to progress workloads to maintain intensity without signs/symptoms of physical distress. Continue to progress workloads to maintain intensity without signs/symptoms of physical distress.   Average METs 2.09 2.02 2.62 2.29 2.1     Resistance Training   Training Prescription Yes Yes -- Yes --   Weight 2 lb 2 lb 2 lb 2 lb 2 lb   Reps 10-15 10-15 10-15 10-15 10-15     Interval Training   Interval Training No No No No No     Oxygen   Oxygen -- -- Continuous -- --     Recumbant Bike   Level 2.5 2 2.5 2 --   Watts 15 15 15 15  --   Minutes 15 15 15 15  --   METs 2.53 2.52 2.53 2.52 --     NuStep   Level 5 3 4 4 4    Minutes 15 15 15 15 15    METs 1.9 2.3 2.5 3.1 2.4     T5 Nustep   Level 1 1 -- -- --   SPM 80 -- -- -- --   Minutes 15 15 -- -- --   METs 2 1.8 -- -- --      Biostep-RELP   Level -- 1 2 -- --   Minutes -- 15 15 -- --   METs -- 2 2 -- --     Track   Laps 10 -- -- -- --   Minutes 15 -- -- -- --   METs 1.54 -- -- -- --     Home Exercise Plan  Plans to continue exercise at Home (comment)  Mindee plans to walk outside in her neighborhood with her husband, it is about a 2 mile loop. Home (comment)  Jhane plans to walk outside in her neighborhood with her husband, it is about a 2 mile loop. Home (comment)  Mandee plans to walk outside in her neighborhood with her husband, it is about a 2 mile loop. Home (comment)  Lindsea plans to walk outside in her neighborhood with her husband, it is about a 2 mile loop. Home (comment)  Dominika plans to walk outside in her neighborhood with her husband, it is about a 2 mile loop.   Frequency Add 3 additional days to program exercise sessions. Add 3 additional days to program exercise sessions. Add 3 additional days to program exercise sessions. Add 3 additional days to program exercise sessions. Add 3 additional days to program exercise sessions.   Initial Home Exercises Provided 11/12/23 11/12/23 11/12/23 11/12/23 11/12/23     Oxygen   Maintain Oxygen Saturation 88% or higher 88% or higher 88% or higher 88% or higher 88% or higher      Exercise Comments:   Exercise Comments     Row Name 10/16/23 0735           Exercise Comments First full day of exercise!  Patient was oriented to gym and equipment including functions, settings, policies, and procedures.  Patient's individual exercise prescription and treatment plan were reviewed.  All starting workloads were established based on the results of the 6 minute walk test done at initial orientation visit.  The plan for exercise progression was also introduced and progression will be customized based on patient's performance and goals.          Exercise Goals and Review:   Exercise Goals     Row Name 10/15/23 1203             Exercise Goals   Increase  Physical Activity Yes       Intervention Provide advice, education, support and counseling about physical activity/exercise needs.;Develop an individualized exercise prescription for aerobic and resistive training based on initial evaluation findings, risk stratification, comorbidities and participant's personal goals.       Expected Outcomes Short Term: Attend rehab on a regular basis to increase amount of physical activity.;Long Term: Add in home exercise to make exercise part of routine and to increase amount of physical activity.;Long Term: Exercising regularly at least 3-5 days a week.       Increase Strength and Stamina Yes       Intervention Provide advice, education, support and counseling about physical activity/exercise needs.;Develop an individualized exercise prescription for aerobic and resistive training based on initial evaluation findings, risk stratification, comorbidities and participant's personal goals.       Expected Outcomes Short Term: Increase workloads from initial exercise prescription for resistance, speed, and METs.;Short Term: Perform resistance training exercises routinely during rehab and add in resistance training at home;Long Term: Improve cardiorespiratory fitness, muscular endurance and strength as measured by increased METs and functional capacity ( )       Able to understand and use rate of perceived exertion (RPE) scale Yes       Intervention Provide education and explanation on how to use RPE scale       Expected Outcomes Short Term: Able to use RPE daily in rehab to express subjective intensity level;Long Term:  Able to use RPE to guide intensity level when exercising independently  Able to understand and use Dyspnea scale Yes       Intervention Provide education and explanation on how to use Dyspnea scale       Expected Outcomes Short Term: Able to use Dyspnea scale daily in rehab to express subjective sense of shortness of breath during exertion;Long Term:  Able to use Dyspnea scale to guide intensity level when exercising independently       Knowledge and understanding of Target Heart Rate Range (THRR) Yes       Intervention Provide education and explanation of THRR including how the numbers were predicted and where they are located for reference       Expected Outcomes Short Term: Able to state/look up THRR;Short Term: Able to use daily as guideline for intensity in rehab;Long Term: Able to use THRR to govern intensity when exercising independently       Able to check pulse independently Yes       Intervention Provide education and demonstration on how to check pulse in carotid and radial arteries.;Review the importance of being able to check your own pulse for safety during independent exercise       Expected Outcomes Short Term: Able to explain why pulse checking is important during independent exercise;Long Term: Able to check pulse independently and accurately       Understanding of Exercise Prescription Yes       Intervention Provide education, explanation, and written materials on patient's individual exercise prescription       Expected Outcomes Short Term: Able to explain program exercise prescription;Long Term: Able to explain home exercise prescription to exercise independently          Exercise Goals Re-Evaluation :  Exercise Goals Re-Evaluation     Row Name 10/16/23 0735 10/22/23 1153 11/09/23 0758 11/12/23 1049 11/22/23 1623     Exercise Goal Re-Evaluation   Exercise Goals Review Increase Physical Activity;Understanding of Exercise Prescription;Knowledge and understanding of Target Heart Rate Range (THRR);Able to understand and use rate of perceived exertion (RPE) scale;Increase Strength and Stamina;Able to understand and use Dyspnea scale;Able to check pulse independently Increase Physical Activity;Increase Strength and Stamina;Understanding of Exercise Prescription Increase Physical Activity;Increase Strength and  Stamina;Understanding of Exercise Prescription Increase Physical Activity;Increase Strength and Stamina;Understanding of Exercise Prescription;Able to understand and use rate of perceived exertion (RPE) scale;Able to understand and use Dyspnea scale;Knowledge and understanding of Target Heart Rate Range (THRR);Able to check pulse independently Increase Physical Activity;Increase Strength and Stamina;Understanding of Exercise Prescription   Comments Reviewed RPE and dyspnea scale, THR and program prescription with pt today.  Pt voiced understanding and was given a copy of goals to take home. Vianca is off to a great start in rehab, and was able to attend her first session during this review period. During her session she was able to use the recumbent bike at level 1.2, and T4 nustep at level 1. We will continue to moniotr her progress in the program. Aniqua is doing well in the program. She recently walked up to 24 laps on the track. She also improved to level 3 on both the T4 nustep and the recumbent bike. We will continue to monitor her progress in the program. Reviewed home exercise with pt today.  Pt plans to walk outside in her neighborhood for exercise.  Reviewed THR, pulse, RPE, sign and symptoms, pulse oximetery and when to call 911 or MD.  Also discussed weather considerations and indoor options.  Pt voiced understanding. Shawntavia is doing well in rehab. She  was recently able to increase her level on both the T4 nustep and recumbent bike to level 4. She was also able to walk 11 laps on the track. We will continue to monitor her progress in the program.   Expected Outcomes Short: Use RPE daily to regulate intensity. Long: Follow program prescription in THR. Short: Continue to follow exercise prescription. Long: Continue to improve strength and stamina. Short: Continue to progressively increase workloads. Long: Continue to improve strength and stamina. Short: Implement home exercise into her weekly exercise  prescription. Long: Continue exercise to improve strength and stamina. Short: Continue to increase workloads on the recumbent bike and T4 nustep. Long: Continue exercise to improve strength and stamina.    Row Name 12/06/23 0848 12/10/23 0957 12/18/23 1511 01/02/24 0928 01/21/24 1026     Exercise Goal Re-Evaluation   Exercise Goals Review Increase Physical Activity;Increase Strength and Stamina;Understanding of Exercise Prescription Increase Physical Activity;Increase Strength and Stamina;Improve claudication pain tolerance and improve walking ability Increase Physical Activity;Increase Strength and Stamina;Improve claudication pain tolerance and improve walking ability Increase Physical Activity;Increase Strength and Stamina;Understanding of Exercise Prescription Increase Physical Activity;Increase Strength and Stamina;Understanding of Exercise Prescription   Comments Sedonia is doing well in rehab. She increased to level 5 on the T4 nustep. She was able to walk 10 laps on the track. She added the T5 at level 1. She decreased on the recumbent bike to level 2.5 from 4. We will continue to monitor her progress in the program. Kenslie reports that she is trying to do some walking at home on off days for rehab. She reports that she feels much stronger and her stamina has improved since starting the program. Eriana is doing well in rehab. She maintained level 2 on the recumbent bike, level 1 on the biostep, and level 1 on the T5 nustep. She worked at level 3 on the T4 nustep. We will continue to monitor her progress in the program. Areyana is doing well in rehab. She recently improved to level 2.5 on the recumbent bike. She also worked at level 4 on the T4 nustep and level 2 on the biostep. We will continue to monitor her progress in the program. Amilia is doing well in rehab. She is due for her post soon and hopes to improve. She maintained level 4 on the T4 nustep and level 2 on the recumbent bike. She is on  hold currently until she receives her new insurance card. We will continue to monitor her progress in the program when she returns.   Expected Outcomes Short: Get back to level 4 on recumbent bike. Long: Continue exercise to improve strength and stamina. Short: continue to exercise at home on off days of rehab. Long: maintain exercise routine upon graduation from cardiac rehab. Short: Try level 2 on the T5 nustep and biostep. Long: Continue exercise to improve strength and stamina. Short: Continue to progressively increase workloads. Long: Continue exercise to improve strength and stamina. Short: Return to rehab and improve on post . Long: Continue to increase overall METs and stamina.    Row Name 01/28/24 9047 01/29/24 1420           Exercise Goal Re-Evaluation   Exercise Goals Review Increase Physical Activity;Increase Strength and Stamina;Understanding of Exercise Prescription Increase Physical Activity;Increase Strength and Stamina;Understanding of Exercise Prescription      Comments Krishawna improved in her post 6 MWT by 100 feet. She states that she feels so much better and has been able to be  more active at home since starting the program. She is looking into the [facility] hospital fitness center because her doctor told her she would like her to continue exercise in a medically based fitness center. Gresia is doing well in rehab. She recently completed her post-6MWT and was able to increase by 9%. She was also able to maintain level 4 on the T4 nustep. We will continue to monitor her progress in the program.      Expected Outcomes Short: graduate from cardiac rehab. Long: maintain independent exercise routine upon graduation from cardiac rehab. Short: Graduate. Long: Continue exercise to improve strength and stamina.         Discharge Exercise Prescription (Final Exercise Prescription Changes):  Exercise Prescription Changes - 01/29/24 1400       Response to Exercise   Blood Pressure  (Admit) 122/60    Blood Pressure (Exit) 114/66    Heart Rate (Admit) 73 bpm    Heart Rate (Exercise) 81 bpm    Heart Rate (Exit) 82 bpm    Oxygen Saturation (Admit) 98 %    Oxygen Saturation (Exercise) 94 %    Oxygen Saturation (Exit) 98 %    Rating of Perceived Exertion (Exercise) 13    Symptoms none    Duration Continue with 30 min of aerobic exercise without signs/symptoms of physical distress.    Intensity THRR unchanged      Progression   Progression Continue to progress workloads to maintain intensity without signs/symptoms of physical distress.    Average METs 2.1      Resistance Training   Weight 2 lb    Reps 10-15      Interval Training   Interval Training No      NuStep   Level 4    Minutes 15    METs 2.4      Home Exercise Plan   Plans to continue exercise at Home (comment)   Keelan plans to walk outside in her neighborhood with her husband, it is about a 2 mile loop.   Frequency Add 3 additional days to program exercise sessions.    Initial Home Exercises Provided 11/12/23      Oxygen   Maintain Oxygen Saturation 88% or higher          Nutrition:  Target Goals: Understanding of nutrition guidelines, daily intake of sodium 1500mg , cholesterol 200mg , calories 30% from fat and 7% or less from saturated fats, daily to have 5 or more servings of fruits and vegetables.  Education: Nutrition 1 -Group instruction provided by verbal, written material, interactive activities, discussions, models, and posters to present general guidelines for heart healthy nutrition including macronutrients, label reading, and promoting whole foods over processed counterparts. Education serves as pensions consultant of discussion of heart healthy eating for all. Written material provided at class time. Flowsheet Row Cardiac Rehab from 11/14/2023 in [facility] Cardiac and Pulmonary Rehab  Date 11/08/23  Educator jg  Instruction Review Code 1- Verbalizes Understanding     Education: Nutrition  2 -Group instruction provided by verbal, written material, interactive activities, discussions, models, and posters to present general guidelines for heart healthy nutrition including sodium, cholesterol, and saturated fat. Providing guidance of habit forming to improve blood pressure, cholesterol, and body weight. Written material provided at class time. Flowsheet Row Cardiac Rehab from 11/14/2023 in [facility] Cardiac and Pulmonary Rehab  Date 11/14/23  Educator jg  Instruction Review Code 1- Verbalizes Understanding      Biometrics:  Pre Biometrics - 10/15/23 1204  Pre Biometrics   Height 5' 6.6 (1.692 m)    Weight 192 lb 8 oz (87.3 kg)    Waist Circumference 40.5 inches    Hip Circumference 47 inches    Waist to Hip Ratio 0.86 %    BMI (Calculated) 30.5    Single Leg Stand 3.2 seconds          Post Biometrics - 01/28/24 0951        Post  Biometrics   Height 5' 6.6 (1.692 m)    Weight 201 lb 9.6 oz (91.4 kg)    Waist Circumference 39.5 inches    Hip Circumference 48 inches    Waist to Hip Ratio 0.82 %    BMI (Calculated) 31.94    Single Leg Stand 3.34 seconds          Nutrition Therapy Plan and Nutrition Goals:  Nutrition Therapy & Goals - 12/17/23 1343       Nutrition Therapy   Diet Cardiac, Low Na, Carb controlled    Protein (specify units) 70-90    Fiber 25 grams    Whole Grain Foods 3 servings    Saturated Fats 15 max. grams    Fruits and Vegetables 5 servings/day    Sodium 2 grams      Personal Nutrition Goals   Nutrition Goal Eat 15-30gProtein and 30-60gCarbs at each meal.    Personal Goal #2 cut back on snacking and keep sugar under 50g per day    Personal Goal #3 Read labels and reduce sodium intake to below 2300mg . Ideally 1500mg  per day.    Comments Sacora reports she has been through a lot in the past year or so. She had heart attack with 3 stents placed. Then had a GI bleed and bulging disks which caused 2 pinched nerves. She went to PT and  then cardiac rehab, she reports those two programs have helped a lot with breathing and mobility but she still has pain. She says she lost ~30lbs over the course of these events but says the past 3 months she has gained ~10lbs. Spoke with her about making sure her weight loss is healthy and she is not losing muscle mass or strength. She has attended both RD classes and remembers much of what what taught. She meets with RD today for additional clarity on facts labels and sugar intake. Ways to break habits of eating sweet foods after every meal. Reviewed these topics and brainstormed healthier snacks with less calories and sugar.      Intervention Plan   Intervention Prescribe, educate and counsel regarding individualized specific dietary modifications aiming towards targeted core components such as weight, hypertension, lipid management, diabetes, heart failure and other comorbidities.;Nutrition handout(s) given to patient.    Expected Outcomes Short Term Goal: Understand basic principles of dietary content, such as calories, fat, sodium, cholesterol and nutrients.;Short Term Goal: A plan has been developed with personal nutrition goals set during dietitian appointment.;Long Term Goal: Adherence to prescribed nutrition plan.          Nutrition Assessments:  MEDIFICTS Score Key: >=70 Need to make dietary changes  40-70 Heart Healthy Diet <= 40 Therapeutic Level Cholesterol Diet  Flowsheet Row Cardiac Rehab from 10/15/2023 in [facility] Cardiac and Pulmonary Rehab  Picture Your Plate Total Score on Admission 70   Picture Your Plate Scores: <59 Unhealthy dietary pattern with much room for improvement. 41-50 Dietary pattern unlikely to meet recommendations for good health and room for improvement. 51-60 More healthful dietary pattern,  with some room for improvement.  >60 Healthy dietary pattern, although there may be some specific behaviors that could be improved.    Nutrition Goals Re-Evaluation:   Nutrition Goals Re-Evaluation     Row Name 11/19/23 0950 12/10/23 0953 01/28/24 0934         Goals   Comment Delaynee stated that she is interested in meeting with RD but that she is still not ready. She stated she would like to meet with him around the first of December. RD schedule does not go out that far so patient was reminded to let staff know when she is ready to schedule that appointment. Naija has a RD apt with program RD on December 1st. Cosette will graduate soon from cardiac rehab. She reports that she is doing well with reducing sodium. She has completely eliminated it from home eating and only gets added sodium when she goes out to eat. She does try to make healthy choices when eating out as well. She does still like to snack but has been mindful of making healthier choices with snacks and keeping portions smaller. She reports eating blananced meals including carb and protein to help keep blood sugars under control.     Expected Outcome Short: meet with RD to set nutrition goals. Long: work on nurse, mental health by RD. Short: meet with RD to establish nutrition goals. Long: work on nurse, mental health by RD. Short: graduate from cardiac rehab. Long: maintain heart healthy diet upon graduation.        Nutrition Goals Discharge (Final Nutrition Goals Re-Evaluation):  Nutrition Goals Re-Evaluation - 01/28/24 0934       Goals   Comment Klaira will graduate soon from cardiac rehab. She reports that she is doing well with reducing sodium. She has completely eliminated it from home eating and only gets added sodium when she goes out to eat. She does try to make healthy choices when eating out as well. She does still like to snack but has been mindful of making healthier choices with snacks and keeping portions smaller. She reports eating blananced meals including carb and protein to help keep blood sugars under control.    Expected Outcome Short: graduate from cardiac rehab. Long: maintain heart healthy diet  upon graduation.          Psychosocial: Target Goals: Acknowledge presence or absence of significant depression and/or stress, maximize coping skills, provide positive support system. Participant is able to verbalize types and ability to use techniques and skills needed for reducing stress and depression.   Education: Stress, Anxiety, and Depression - Group verbal and visual presentation to define topics covered.  Reviews how body is impacted by stress, anxiety, and depression.  Also discusses healthy ways to reduce stress and to treat/manage anxiety and depression. Written material provided at class time.   Education: Sleep Hygiene -Provides group verbal and written instruction about how sleep can affect your health.  Define sleep hygiene, discuss sleep cycles and impact of sleep habits. Review good sleep hygiene tips.   Initial Review & Psychosocial Screening:  Initial Psych Review & Screening - 09/12/23 1453       Initial Review   Current issues with Current Stress Concerns    Source of Stress Concerns Chronic Illness    Comments Licet gets down since her health event and her husband is picking up the slack.      Family Dynamics   Good Support System? Yes    Comments She can look to her husband,  daughters and neighbors for support.      Barriers   Psychosocial barriers to participate in program The patient should benefit from training in stress management and relaxation.      Screening Interventions   Interventions Encouraged to exercise;Provide feedback about the scores to participant;To provide support and resources with identified psychosocial needs    Expected Outcomes Short Term goal: Utilizing psychosocial counselor, staff and physician to assist with identification of specific Stressors or current issues interfering with healing process. Setting desired goal for each stressor or current issue identified.;Long Term Goal: Stressors or current issues are controlled or  eliminated.;Short Term goal: Identification and review with participant of any Quality of Life or Depression concerns found by scoring the questionnaire.;Long Term goal: The participant improves quality of Life and PHQ9 Scores as seen by post scores and/or verbalization of changes          Quality of Life Scores:   Quality of Life - 10/15/23 1205       Quality of Life   Select Quality of Life      Quality of Life Scores   Health/Function Pre 12.86 %    Socioeconomic Pre 30 %    Psych/Spiritual Pre 18.86 %    Family Pre 26.4 %    GLOBAL Pre 19.5 %         Scores of 19 and below usually indicate a poorer quality of life in these areas.  A difference of  2-3 points is a clinically meaningful difference.  A difference of 2-3 points in the total score of the Quality of Life Index has been associated with significant improvement in overall quality of life, self-image, physical symptoms, and general health in studies assessing change in quality of life.  PHQ-9: Review Flowsheet  More data exists      12/25/2023 12/10/2023 11/14/2023 10/15/2023 09/18/2023  Depression screen PHQ 2/9  Decreased Interest 0 0 0 0 1  Down, Depressed, Hopeless 1 0 0 1 1  PHQ - 2 Score 1 0 0 1 2  Altered sleeping 0 0 0 0 0  Tired, decreased energy 3 3 3 3 1   Change in appetite 0 0 1 0 0  Feeling bad or failure about yourself  1 0 1 1 1   Trouble concentrating 0 0 0 0 0  Moving slowly or fidgety/restless 0 0 0 0 0  Suicidal thoughts 0 0 0 0 0  PHQ-9 Score 5 3 5  5  4    Difficult doing work/chores Somewhat difficult Somewhat difficult Somewhat difficult Somewhat difficult Somewhat difficult    Details       Data saved with a previous flowsheet row definition        Interpretation of Total Score  Total Score Depression Severity:  1-4 = Minimal depression, 5-9 = Mild depression, 10-14 = Moderate depression, 15-19 = Moderately severe depression, 20-27 = Severe depression   Psychosocial Evaluation and  Intervention:  Psychosocial Evaluation - 09/12/23 1455       Psychosocial Evaluation & Interventions   Interventions Encouraged to exercise with the program and follow exercise prescription;Relaxation education;Stress management education    Comments She can look to her husband, daughters and neighbors for support.Jared gets down since her health event and her husband is picking up the slack.    Expected Outcomes Short: Start [facility] to help with mood. Long: Maintain a healthy mental state    Continue Psychosocial Services  Follow up required by staff  Psychosocial Re-Evaluation:  Psychosocial Re-Evaluation     Row Name 11/14/23 [id number] 12/10/23 0951 01/28/24 0944         Psychosocial Re-Evaluation   Current issues with Current Psychotropic Meds;Current Anxiety/Panic Current Psychotropic Meds;Current Anxiety/Panic Current Psychotropic Meds;Current Anxiety/Panic     Comments Reviewed patient health questionnaire (PHQ-9) with patient for follow up. Previously, patients score indicated signs/symptoms of depression.  Reviewed to see if patient is improving symptom wise while in program.  Score stayed the same and patient states that it is because she has a lack of energy daily. Patient re-did her PHQ 9 and went down from a 5 to a 3, which shows improvement. He reports that she has no changes in sleep, stress, or mental health. She sleeps well with her C-PAP machine. She also reports that she continues to have a strong support system. Patient reports that she sleeps well with her C-PAP and has no concerns or chages with stress or mental health. She continues to have a good support system. She will graduate from cardiac rehab in the next week or 2 and plans to continue managing her mental health.     Expected Outcomes Short: Continue to work toward an improvement in PHQ9 scores by attending [facility] regularly. Long: Continue to improve stress and depression coping skills by talking with  staff and attendin [facility] regularly and work toward a positive mental state. Short: continue to attend cardiac rehab for the mental health benefits of exercise. Long: maintain good mental health routine. Short: graduate. Long: maintain good mental health routine.     Interventions Encouraged to attend Cardiac Rehabilitation for the exercise Encouraged to attend Cardiac Rehabilitation for the exercise Encouraged to attend Cardiac Rehabilitation for the exercise     Continue Psychosocial Services  Follow up required by staff Follow up required by staff No Follow up required        Psychosocial Discharge (Final Psychosocial Re-Evaluation):  Psychosocial Re-Evaluation - 01/28/24 0944       Psychosocial Re-Evaluation   Current issues with Current Psychotropic Meds;Current Anxiety/Panic    Comments Patient reports that she sleeps well with her C-PAP and has no concerns or chages with stress or mental health. She continues to have a good support system. She will graduate from cardiac rehab in the next week or 2 and plans to continue managing her mental health.    Expected Outcomes Short: graduate. Long: maintain good mental health routine.    Interventions Encouraged to attend Cardiac Rehabilitation for the exercise    Continue Psychosocial Services  No Follow up required          Vocational Rehabilitation: Provide vocational rehab assistance to qualifying candidates.   Vocational Rehab Evaluation & Intervention:   Education: Education Goals: Education classes will be provided on a variety of topics geared toward better understanding of heart health and risk factor modification. Participant will state understanding/return demonstration of topics presented as noted by education test scores.  Learning Barriers/Preferences:  Learning Barriers/Preferences - 09/12/23 1451       Learning Barriers/Preferences   Learning Barriers None    Learning Preferences None          General  Cardiac Education Topics:  AED/CPR: - Group verbal and written instruction with the use of models to demonstrate the basic use of the AED with the basic ABC's of resuscitation.   Test and Procedures: - Group verbal and visual presentation and models provide information about basic cardiac anatomy and function. Reviews the testing methods  done to diagnose heart disease and the outcomes of the test results. Describes the treatment choices: Medical Management, Angioplasty, or Coronary Bypass Surgery for treating various heart conditions including Myocardial Infarction, Angina, Valve Disease, and Cardiac Arrhythmias. Written material provided at class time. Flowsheet Row Cardiac Rehab from 11/14/2023 in [facility] Cardiac and Pulmonary Rehab  Education need identified 10/15/23    Medication Safety: - Group verbal and visual instruction to review commonly prescribed medications for heart and lung disease. Reviews the medication, class of the drug, and side effects. Includes the steps to properly store meds and maintain the prescription regimen. Written material provided at class time.   Intimacy: - Group verbal instruction through game format to discuss how heart and lung disease can affect sexual intimacy. Written material provided at class time.   Know Your Numbers and Heart Failure: - Group verbal and visual instruction to discuss disease risk factors for cardiac and pulmonary disease and treatment options.  Reviews associated critical values for Overweight/Obesity, Hypertension, Cholesterol, and Diabetes.  Discusses basics of heart failure: signs/symptoms and treatments.  Introduces Heart Failure Zone chart for action plan for heart failure. Written material provided at class time. Flowsheet Row Cardiac Rehab from 11/14/2023 in [facility] Cardiac and Pulmonary Rehab  Education need identified 10/15/23    Infection Prevention: - Provides verbal and written material to individual with discussion of  infection control including proper hand washing and proper equipment cleaning during exercise session. Flowsheet Row Cardiac Rehab from 11/14/2023 in [facility] Cardiac and Pulmonary Rehab  Date 10/15/23  Educator MB  Instruction Review Code 1- Verbalizes Understanding    Falls Prevention: - Provides verbal and written material to individual with discussion of falls prevention and safety. Flowsheet Row Cardiac Rehab from 11/14/2023 in [facility] Cardiac and Pulmonary Rehab  Date 10/15/23  Educator MB  Instruction Review Code 1- Verbalizes Understanding    Other: -Provides group and verbal instruction on various topics (see comments)   Knowledge Questionnaire Score:  Knowledge Questionnaire Score - 10/15/23 1207       Knowledge Questionnaire Score   Pre Score 22/26          Core Components/Risk Factors/Patient Goals at Admission:  Personal Goals and Risk Factors at Admission - 10/15/23 1207       Core Components/Risk Factors/Patient Goals on Admission    Weight Management Yes;Weight Loss    Intervention Weight Management: Develop a combined nutrition and exercise program designed to reach desired caloric intake, while maintaining appropriate intake of nutrient and fiber, sodium and fats, and appropriate energy expenditure required for the weight goal.;Weight Management: Provide education and appropriate resources to help participant work on and attain dietary goals.;Weight Management/Obesity: Establish reasonable short term and long term weight goals.    Admit Weight 192 lb 8 oz (87.3 kg)    Goal Weight: Short Term 180 lb (81.6 kg)    Goal Weight: Long Term 160 lb (72.6 kg)    Expected Outcomes Short Term: Continue to assess and modify interventions until short term weight is achieved;Long Term: Adherence to nutrition and physical activity/exercise program aimed toward attainment of established weight goal;Weight Loss: Understanding of general recommendations for a balanced deficit meal  plan, which promotes 1-2 lb weight loss per week and includes a negative energy balance of [phone] kcal/d;Understanding recommendations for meals to include 15-35% energy as protein, 25-35% energy from fat, 35-60% energy from carbohydrates, less than 200mg  of dietary cholesterol, 20-35 gm of total fiber daily;Understanding of distribution of calorie intake throughout the day  with the consumption of 4-5 meals/snacks    Diabetes Yes   Diet controlled   Intervention Provide education about signs/symptoms and action to take for hypo/hyperglycemia.;Provide education about proper nutrition, including hydration, and aerobic/resistive exercise prescription along with prescribed medications to achieve blood glucose in normal ranges: Fasting glucose 65-99 mg/dL    Expected Outcomes Short Term: Participant verbalizes understanding of the signs/symptoms and immediate care of hyper/hypoglycemia, proper foot care and importance of medication, aerobic/resistive exercise and nutrition plan for blood glucose control.;Long Term: Attainment of HbA1C < 7%.    Hypertension Yes    Intervention Provide education on lifestyle modifcations including regular physical activity/exercise, weight management, moderate sodium restriction and increased consumption of fresh fruit, vegetables, and low fat dairy, alcohol moderation, and smoking cessation.;Monitor prescription use compliance.    Expected Outcomes Short Term: Continued assessment and intervention until BP is < 140/74mm HG in hypertensive participants. < 130/37mm HG in hypertensive participants with diabetes, heart failure or chronic kidney disease.;Long Term: Maintenance of blood pressure at goal levels.    Lipids Yes    Intervention Provide education and support for participant on nutrition & aerobic/resistive exercise along with prescribed medications to achieve LDL 70mg , HDL >40mg .    Expected Outcomes Short Term: Participant states understanding of desired cholesterol  values and is compliant with medications prescribed. Participant is following exercise prescription and nutrition guidelines.;Long Term: Cholesterol controlled with medications as prescribed, with individualized exercise RX and with personalized nutrition plan. Value goals: LDL < 70mg , HDL > 40 mg.          Education:Diabetes - Individual verbal and written instruction to review signs/symptoms of diabetes, desired ranges of glucose level fasting, after meals and with exercise. Acknowledge that pre and post exercise glucose checks will be done for 3 sessions at entry of program. Flowsheet Row Cardiac Rehab from 11/14/2023 in [facility] Cardiac and Pulmonary Rehab  Date 10/15/23  Educator MB  Instruction Review Code 1- Verbalizes Understanding  [Diet controlled]    Core Components/Risk Factors/Patient Goals Review:   Goals and Risk Factor Review     Row Name 11/14/23 0822 12/10/23 0954 01/28/24 0941         Core Components/Risk Factors/Patient Goals Review   Personal Goals Review Other Hypertension;Lipids;Diabetes Weight Management/Obesity;Lipids;Diabetes;Hypertension     Review Milani would like to speak with her doctor about her shoulder pain and sometimes shortness of breath. She is taking Plavix  and informed patient to speak with her doctor of possible side effects of the medication. Brogan reports that she monitors her blood pressure and blood sugar at home and it has been good. She does not take any medication from BP or DM but manages it with diet and lifestyle. She does take cholesterol medicaiton and follows up with her doctor every 3 months to for lab work to monitor cholesterol levels. Lakendra will graduate from cardiac rehab in the next couple weeks. She continues to take all medication for BP, DM, and cholesterol. She plans to continue to manage her risk factors in this way upon graduation from rehab. Her weight has been steady during her time in rehab, but her waist went down 1 inch from  the time she started the program.     Expected Outcomes Short: speak with doctor about her medication. Long: maintain medication independently. Short: continue to check BP and blood sugars at home. Long: control cardiac risk factors. Short: graudate. Long: continue to manage cardiac risk factors.        Core Components/Risk Factors/Patient  Goals at Discharge (Final Review):   Goals and Risk Factor Review - 01/28/24 0941       Core Components/Risk Factors/Patient Goals Review   Personal Goals Review Weight Management/Obesity;Lipids;Diabetes;Hypertension    Review Elienai will graduate from cardiac rehab in the next couple weeks. She continues to take all medication for BP, DM, and cholesterol. She plans to continue to manage her risk factors in this way upon graduation from rehab. Her weight has been steady during her time in rehab, but her waist went down 1 inch from the time she started the program.    Expected Outcomes Short: graudate. Long: continue to manage cardiac risk factors.          ITP Comments:  ITP Comments     Row Name 09/12/23 1500 10/15/23 1157 10/16/23 0735 11/07/23 1005 12/05/23 0947   ITP Comments Virtual Visit completed. Patient informed on EP and RD appointment and 6 Minute walk test. Patient also informed of patient health questionnaires on My Chart. Patient Verbalizes understanding. Visit diagnosis can be found in [facility] 12/11[facility]23. Completed and gym orientation for cardiac rehab. Initial ITP created and sent for review to Dr. Oneil Pinal, Medical Director. First full day of exercise!  Patient was oriented to gym and equipment including functions, settings, policies, and procedures.  Patient's individual exercise prescription and treatment plan were reviewed.  All starting workloads were established based on the results of the 6 minute walk test done at initial orientation visit.  The plan for exercise progression was also introduced and progression will be customized  based on patient's performance and goals. 30 Day review completed. Medical Director ITP review done, changes made as directed, and signed approval by Medical Director. 30 Day review completed. Medical Director ITP review done, changes made as directed, and signed approval by Medical Director.    Row Name 01/02/24 0805 01/30/24 0953         ITP Comments 30 Day review completed. Medical Director ITP review done, changes made as directed, and signed approval by Medical Director. 30 Day review completed. Medical Director ITP review done, changes made as directed, and signed approval by Medical Director.         Comments: 30 Day Review     [1]  Current Outpatient Medications:    albuterol  (VENTOLIN  HFA) 108 (90 Base) MCG/ACT inhaler, Inhale 1-2 puffs into the lungs every 4-6 hours as needed for cough/wheeze., Disp: 18 g, Rfl: 0   ALPRAZolam  (XANAX ) 0.25 MG tablet, Take 1 tablet (0.25 mg total) by mouth 2 (two) times daily as needed for anxiety., Disp: 30 tablet, Rfl: 0   bimatoprost  (LUMIGAN ) 0.01 % SOLN, Place 1 drop into both eyes at bedtime., Disp: 2.5 mL, Rfl: 5   budesonide  (PULMICORT ) 180 MCG/ACT inhaler, Inhale 2 puffs into the lungs 2 (two) times daily., Disp: , Rfl:    carvedilol  (COREG ) 3.125 MG tablet, Take 1 tablet (3.125 mg total) by mouth 2 (two) times daily with a meal., Disp: 180 tablet, Rfl: 3   cetirizine (ZYRTEC) 10 MG tablet, Take 10 mg by mouth daily., Disp: , Rfl:    clopidogrel  (PLAVIX ) 75 MG tablet, Take 1 tablet (75 mg total) by mouth daily., Disp: 90 tablet, Rfl: 3   EPINEPHrine 0.3 mg/0.3 mL IJ SOAJ injection, as needed., Disp: , Rfl:    ezetimibe  (ZETIA ) 10 MG tablet, Take 1 tablet (10 mg total) by mouth daily. (Patient taking differently: Take 10 mg by mouth every other day.), Disp: 90 tablet, Rfl:  3   MILK THISTLE PO, Take 1 capsule by mouth daily. , Disp: , Rfl:    nitroGLYCERIN  (NITROSTAT ) 0.4 MG SL tablet, Place 1 tablet (0.4 mg total) under the tongue every 5  (five) minutes for up to 3 doses as needed for chest pain., Disp: 25 tablet, Rfl: 0   polyethylene glycol (MIRALAX  / GLYCOLAX ) 17 g packet, Take 17 g by mouth daily as needed for mild constipation., Disp: 14 each, Rfl: 0   Probiotic Product (PROBIOTIC COMPLEX ACIDOPHILUS PO), Take 1 capsule by mouth daily., Disp: , Rfl:    rosuvastatin  (CRESTOR ) 5 MG tablet, Take 1 tablet (5 mg total) by mouth daily. (Patient taking differently: Take 5 mg by mouth every other day.), Disp: 90 tablet, Rfl: 3   sodium chloride  (OCEAN) 0.65 % SOLN nasal spray, Place 1 spray into the nose as needed., Disp: , Rfl:  [2]  Social History Tobacco Use  Smoking Status Former   Current packs/day: 0.00   Types: Cigarettes   Quit date: 01/16/1978   Years since quitting: 46.0  Smokeless Tobacco Never  Tobacco Comments   REMOTELY QUIT IN 1987 AFTER A FEW YEARS OF USE

## 2024-01-31 ENCOUNTER — Encounter: Admitting: Emergency Medicine

## 2024-01-31 ENCOUNTER — Encounter

## 2024-01-31 DIAGNOSIS — I214 Non-ST elevation (NSTEMI) myocardial infarction: Secondary | ICD-10-CM

## 2024-01-31 DIAGNOSIS — Z48812 Encounter for surgical aftercare following surgery on the circulatory system: Secondary | ICD-10-CM | POA: Diagnosis not present

## 2024-01-31 DIAGNOSIS — Z955 Presence of coronary angioplasty implant and graft: Secondary | ICD-10-CM

## 2024-01-31 NOTE — Progress Notes (Signed)
 Daily Session Note  Patient Details  Name: [name] MRN: [id number] Date of Birth: Sep 24, 1945 Referring Provider:   Flowsheet Row Cardiac Rehab from 10/15/2023 in [facility] Cardiac and Pulmonary Rehab  Referring Provider Darron Grass, MD    Encounter Date: 01/31/2024  Check In:  Session Check In - 01/31/24 0925       Check-In   Supervising physician immediately available to respond to emergencies See telemetry face sheet for immediately available ER MD    Location [facility]-Cardiac & Pulmonary Rehab    Staff Present Leita Franks RN,BSN;[facility] BS, Exercise Physiologist;Noah Tickle, BS, Exercise Physiologist    Virtual Visit No    Medication changes reported     No    Fall or balance concerns reported    No    Warm-up and Cool-down Performed on first and last piece of equipment    Resistance Training Performed Yes    VAD Patient? No    PAD/SET Patient? No      Pain Assessment   Currently in Pain? No/denies             Tobacco Use History[1]  Goals Met:  Independence with exercise equipment Exercise tolerated well No report of concerns or symptoms today Strength training completed today  Goals Unmet:  Not Applicable  Comments: Pt able to follow exercise prescription today without complaint.  Will continue to monitor for progression.    Dr. Oneil Pinal is Medical Director for [facility] Cardiac Rehabilitation.  Dr. Fuad Aleskerov is Medical Director for [facility] Pulmonary Rehabilitation.    [1]  Social History Tobacco Use  Smoking Status Former   Current packs/day: 0.00   Types: Cigarettes   Quit date: 01/16/1978   Years since quitting: 46.0  Smokeless Tobacco Never  Tobacco Comments   REMOTELY QUIT IN 1987 AFTER A FEW YEARS OF USE

## 2024-02-04 ENCOUNTER — Encounter

## 2024-02-04 DIAGNOSIS — Z48812 Encounter for surgical aftercare following surgery on the circulatory system: Secondary | ICD-10-CM | POA: Diagnosis not present

## 2024-02-04 DIAGNOSIS — I214 Non-ST elevation (NSTEMI) myocardial infarction: Secondary | ICD-10-CM

## 2024-02-04 DIAGNOSIS — Z955 Presence of coronary angioplasty implant and graft: Secondary | ICD-10-CM

## 2024-02-04 NOTE — Progress Notes (Signed)
 Daily Session Note  Patient Details  Name: [name] MRN: [id number] Date of Birth: 10-07-1945 Referring Provider:   Flowsheet Row Cardiac Rehab from 10/15/2023 in [facility] Cardiac and Pulmonary Rehab  Referring Provider Darron Grass, MD    Encounter Date: 02/04/2024  Check In:  Session Check In - 02/04/24 9076       Check-In   Supervising physician immediately available to respond to emergencies See telemetry face sheet for immediately available ER MD    Location [facility]-Cardiac & Pulmonary Rehab    Staff Present Burnard Davenport RN,BSN,MPA;Joseph Hood RCP,RRT,BSRT;Maxon Burnell BS, Exercise Physiologist;Lizabeth Fellner Dyane HECKLE, ACSM CEP, Exercise Physiologist    Virtual Visit No    Medication changes reported     No    Fall or balance concerns reported    No    Warm-up and Cool-down Performed on first and last piece of equipment    Resistance Training Performed Yes    VAD Patient? No    PAD/SET Patient? No      Pain Assessment   Currently in Pain? No/denies             Tobacco Use History[1]  Goals Met:  Proper associated with RPD/PD & O2 Sat Independence with exercise equipment Exercise tolerated well No report of concerns or symptoms today Strength training completed today  Goals Unmet:  Not Applicable  Comments: Pt able to follow exercise prescription today without complaint.  Will continue to monitor for progression.    Dr. Oneil Pinal is Medical Director for [facility] Cardiac Rehabilitation.  Dr. Fuad Aleskerov is Medical Director for [facility] Pulmonary Rehabilitation.    [1]  Social History Tobacco Use  Smoking Status Former   Current packs/day: 0.00   Types: Cigarettes   Quit date: 01/16/1978   Years since quitting: 46.0  Smokeless Tobacco Never  Tobacco Comments   REMOTELY QUIT IN 1987 AFTER A FEW YEARS OF USE

## 2024-02-07 ENCOUNTER — Encounter

## 2024-02-11 ENCOUNTER — Encounter

## 2024-02-14 ENCOUNTER — Encounter: Admitting: Emergency Medicine

## 2024-02-14 DIAGNOSIS — Z955 Presence of coronary angioplasty implant and graft: Secondary | ICD-10-CM

## 2024-02-14 DIAGNOSIS — Z48812 Encounter for surgical aftercare following surgery on the circulatory system: Secondary | ICD-10-CM | POA: Diagnosis not present

## 2024-02-14 DIAGNOSIS — I214 Non-ST elevation (NSTEMI) myocardial infarction: Secondary | ICD-10-CM

## 2024-02-14 NOTE — Progress Notes (Signed)
 Daily Session Note  Patient Details  Name: [name] MRN: [id number] Date of Birth: 12/01/1945 Referring Provider:   Flowsheet Row Cardiac Rehab from 10/15/2023 in [facility] Cardiac and Pulmonary Rehab  Referring Provider Darron Grass, MD    Encounter Date: 02/14/2024  Check In:  Session Check In - 02/14/24 0945       Check-In   Supervising physician immediately available to respond to emergencies See telemetry face sheet for immediately available ER MD    Location [facility]-Cardiac & Pulmonary Rehab    Staff Present Leita Franks RN,BSN;Maxon Burnell BS, Exercise Physiologist;Margaret Best, MS, Exercise Physiologist;Jason Elnor RDN,LDN    Virtual Visit No    Medication changes reported     No    Fall or balance concerns reported    No    Warm-up and Cool-down Performed on first and last piece of equipment    Resistance Training Performed Yes    VAD Patient? No    PAD/SET Patient? No      Pain Assessment   Currently in Pain? No/denies             Tobacco Use History[1]  Goals Met:  Independence with exercise equipment Exercise tolerated well No report of concerns or symptoms today Strength training completed today  Goals Unmet:  Not Applicable  Comments: Pt able to follow exercise prescription today without complaint.  Will continue to monitor for progression.    Dr. Oneil Pinal is Medical Director for [facility] Cardiac Rehabilitation.  Dr. Fuad Aleskerov is Medical Director for [facility] Pulmonary Rehabilitation.    [1]  Social History Tobacco Use  Smoking Status Former   Current packs/day: 0.00   Types: Cigarettes   Quit date: 01/16/1978   Years since quitting: 46.1  Smokeless Tobacco Never  Tobacco Comments   REMOTELY QUIT IN 1987 AFTER A FEW YEARS OF USE

## 2024-02-17 ENCOUNTER — Other Ambulatory Visit: Payer: Self-pay | Admitting: Medical

## 2024-02-17 DIAGNOSIS — I25118 Atherosclerotic heart disease of native coronary artery with other forms of angina pectoris: Secondary | ICD-10-CM

## 2024-02-17 DIAGNOSIS — I5020 Unspecified systolic (congestive) heart failure: Secondary | ICD-10-CM

## 2024-02-17 DIAGNOSIS — I5022 Chronic systolic (congestive) heart failure: Secondary | ICD-10-CM

## 2024-02-17 DIAGNOSIS — I2510 Atherosclerotic heart disease of native coronary artery without angina pectoris: Secondary | ICD-10-CM

## 2024-02-18 ENCOUNTER — Encounter

## 2024-02-19 ENCOUNTER — Other Ambulatory Visit: Payer: Self-pay | Admitting: Medical

## 2024-02-28 ENCOUNTER — Encounter

## 2024-03-03 ENCOUNTER — Ambulatory Visit

## 2024-06-06 ENCOUNTER — Ambulatory Visit: Admitting: Cardiovascular Disease

## 2024-06-24 ENCOUNTER — Other Ambulatory Visit

## 2024-06-26 ENCOUNTER — Ambulatory Visit: Admitting: Internal Medicine
